# Patient Record
Sex: Male | Born: 1940 | Race: Black or African American | Hispanic: No | Marital: Married | Smoking: Current every day smoker
Health system: Southern US, Community
[De-identification: ages and names within clinical notes are randomized; demographics above are authoritative.]

## PROBLEM LIST (undated history)

## (undated) DIAGNOSIS — J449 Chronic obstructive pulmonary disease, unspecified: Secondary | ICD-10-CM

## (undated) DIAGNOSIS — K219 Gastro-esophageal reflux disease without esophagitis: Secondary | ICD-10-CM

## (undated) DIAGNOSIS — L97929 Non-pressure chronic ulcer of unspecified part of left lower leg with unspecified severity: Secondary | ICD-10-CM

## (undated) DIAGNOSIS — J45909 Unspecified asthma, uncomplicated: Secondary | ICD-10-CM

## (undated) DIAGNOSIS — Z9119 Patient's noncompliance with other medical treatment and regimen: Secondary | ICD-10-CM

## (undated) DIAGNOSIS — Z91199 Patient's noncompliance with other medical treatment and regimen due to unspecified reason: Secondary | ICD-10-CM

## (undated) DIAGNOSIS — G629 Polyneuropathy, unspecified: Secondary | ICD-10-CM

## (undated) DIAGNOSIS — N289 Disorder of kidney and ureter, unspecified: Secondary | ICD-10-CM

## (undated) DIAGNOSIS — I639 Cerebral infarction, unspecified: Secondary | ICD-10-CM

## (undated) DIAGNOSIS — I10 Essential (primary) hypertension: Secondary | ICD-10-CM

## (undated) DIAGNOSIS — I5022 Chronic systolic (congestive) heart failure: Secondary | ICD-10-CM

## (undated) DIAGNOSIS — L97919 Non-pressure chronic ulcer of unspecified part of right lower leg with unspecified severity: Secondary | ICD-10-CM

## (undated) DIAGNOSIS — I5021 Acute systolic (congestive) heart failure: Secondary | ICD-10-CM

## (undated) DIAGNOSIS — E119 Type 2 diabetes mellitus without complications: Secondary | ICD-10-CM

## (undated) HISTORY — PX: HAND LIGAMENT RECONSTRUCTION: SHX1726

## (undated) HISTORY — PX: FOOT SURGERY: SHX648

---

## 2008-11-05 DIAGNOSIS — I639 Cerebral infarction, unspecified: Secondary | ICD-10-CM

## 2008-11-05 HISTORY — PX: CAROTID ENDARTERECTOMY: SUR193

## 2008-11-05 HISTORY — DX: Cerebral infarction, unspecified: I63.9

## 2014-08-12 ENCOUNTER — Emergency Department (HOSPITAL_COMMUNITY): Payer: Medicare (Managed Care)

## 2014-08-12 ENCOUNTER — Inpatient Hospital Stay (HOSPITAL_COMMUNITY)
Admission: EM | Admit: 2014-08-12 | Discharge: 2014-08-14 | DRG: 291 | Disposition: A | Discharge status: Home / Self Care | Payer: Medicare (Managed Care) | Attending: Internal Medicine | Admitting: Internal Medicine

## 2014-08-12 ENCOUNTER — Encounter (HOSPITAL_COMMUNITY): Payer: Self-pay | Admitting: Emergency Medicine

## 2014-08-12 DIAGNOSIS — I129 Hypertensive chronic kidney disease with stage 1 through stage 4 chronic kidney disease, or unspecified chronic kidney disease: Secondary | ICD-10-CM | POA: Diagnosis present

## 2014-08-12 DIAGNOSIS — E1142 Type 2 diabetes mellitus with diabetic polyneuropathy: Secondary | ICD-10-CM | POA: Diagnosis present

## 2014-08-12 DIAGNOSIS — I255 Ischemic cardiomyopathy: Secondary | ICD-10-CM | POA: Diagnosis present

## 2014-08-12 DIAGNOSIS — E1129 Type 2 diabetes mellitus with other diabetic kidney complication: Secondary | ICD-10-CM

## 2014-08-12 DIAGNOSIS — Z8673 Personal history of transient ischemic attack (TIA), and cerebral infarction without residual deficits: Secondary | ICD-10-CM

## 2014-08-12 DIAGNOSIS — Z7982 Long term (current) use of aspirin: Secondary | ICD-10-CM | POA: Diagnosis not present

## 2014-08-12 DIAGNOSIS — E43 Unspecified severe protein-calorie malnutrition: Secondary | ICD-10-CM | POA: Diagnosis present

## 2014-08-12 DIAGNOSIS — R0902 Hypoxemia: Secondary | ICD-10-CM | POA: Diagnosis not present

## 2014-08-12 DIAGNOSIS — I272 Other secondary pulmonary hypertension: Secondary | ICD-10-CM | POA: Diagnosis present

## 2014-08-12 DIAGNOSIS — J9601 Acute respiratory failure with hypoxia: Secondary | ICD-10-CM | POA: Diagnosis present

## 2014-08-12 DIAGNOSIS — I509 Heart failure, unspecified: Secondary | ICD-10-CM

## 2014-08-12 DIAGNOSIS — R600 Localized edema: Secondary | ICD-10-CM | POA: Diagnosis not present

## 2014-08-12 DIAGNOSIS — F17210 Nicotine dependence, cigarettes, uncomplicated: Secondary | ICD-10-CM | POA: Diagnosis present

## 2014-08-12 DIAGNOSIS — Z6820 Body mass index (BMI) 20.0-20.9, adult: Secondary | ICD-10-CM | POA: Diagnosis not present

## 2014-08-12 DIAGNOSIS — Z8249 Family history of ischemic heart disease and other diseases of the circulatory system: Secondary | ICD-10-CM

## 2014-08-12 DIAGNOSIS — I5022 Chronic systolic (congestive) heart failure: Secondary | ICD-10-CM

## 2014-08-12 DIAGNOSIS — E1165 Type 2 diabetes mellitus with hyperglycemia: Secondary | ICD-10-CM

## 2014-08-12 DIAGNOSIS — Z79899 Other long term (current) drug therapy: Secondary | ICD-10-CM

## 2014-08-12 DIAGNOSIS — IMO0002 Reserved for concepts with insufficient information to code with codable children: Secondary | ICD-10-CM

## 2014-08-12 DIAGNOSIS — N183 Chronic kidney disease, stage 3 (moderate): Secondary | ICD-10-CM | POA: Diagnosis present

## 2014-08-12 DIAGNOSIS — M79609 Pain in unspecified limb: Secondary | ICD-10-CM | POA: Diagnosis not present

## 2014-08-12 DIAGNOSIS — I639 Cerebral infarction, unspecified: Secondary | ICD-10-CM

## 2014-08-12 DIAGNOSIS — G629 Polyneuropathy, unspecified: Secondary | ICD-10-CM

## 2014-08-12 DIAGNOSIS — I5031 Acute diastolic (congestive) heart failure: Secondary | ICD-10-CM

## 2014-08-12 DIAGNOSIS — I5021 Acute systolic (congestive) heart failure: Secondary | ICD-10-CM

## 2014-08-12 DIAGNOSIS — I5023 Acute on chronic systolic (congestive) heart failure: Principal | ICD-10-CM | POA: Diagnosis present

## 2014-08-12 DIAGNOSIS — R0602 Shortness of breath: Secondary | ICD-10-CM | POA: Diagnosis present

## 2014-08-12 DIAGNOSIS — I10 Essential (primary) hypertension: Secondary | ICD-10-CM

## 2014-08-12 HISTORY — DX: Chronic systolic (congestive) heart failure: I50.22

## 2014-08-12 HISTORY — DX: Acute systolic (congestive) heart failure: I50.21

## 2014-08-12 HISTORY — DX: Type 2 diabetes mellitus without complications: E11.9

## 2014-08-12 HISTORY — DX: Cerebral infarction, unspecified: I63.9

## 2014-08-12 HISTORY — DX: Essential (primary) hypertension: I10

## 2014-08-12 LAB — CBC WITH DIFFERENTIAL/PLATELET
BASOS ABS: 0 10*3/uL (ref 0.0–0.1)
BASOS PCT: 1 % (ref 0–1)
EOS ABS: 0 10*3/uL (ref 0.0–0.7)
EOS PCT: 1 % (ref 0–5)
HEMATOCRIT: 36.7 % — AB (ref 39.0–52.0)
HEMOGLOBIN: 12.3 g/dL — AB (ref 13.0–17.0)
Lymphocytes Relative: 29 % (ref 12–46)
Lymphs Abs: 1.4 10*3/uL (ref 0.7–4.0)
MCH: 30 pg (ref 26.0–34.0)
MCHC: 33.5 g/dL (ref 30.0–36.0)
MCV: 89.5 fL (ref 78.0–100.0)
MONO ABS: 0.5 10*3/uL (ref 0.1–1.0)
MONOS PCT: 11 % (ref 3–12)
NEUTROS ABS: 2.9 10*3/uL (ref 1.7–7.7)
Neutrophils Relative %: 58 % (ref 43–77)
Platelets: 201 10*3/uL (ref 150–400)
RBC: 4.1 MIL/uL — ABNORMAL LOW (ref 4.22–5.81)
RDW: 14.9 % (ref 11.5–15.5)
WBC: 5 10*3/uL (ref 4.0–10.5)

## 2014-08-12 LAB — BASIC METABOLIC PANEL
ANION GAP: 10 (ref 5–15)
BUN: 9 mg/dL (ref 6–23)
CALCIUM: 9.3 mg/dL (ref 8.4–10.5)
CO2: 26 mEq/L (ref 19–32)
CREATININE: 0.93 mg/dL (ref 0.50–1.35)
Chloride: 99 mEq/L (ref 96–112)
GFR, EST NON AFRICAN AMERICAN: 83 mL/min — AB (ref >90)
Glucose, Bld: 126 mg/dL — ABNORMAL HIGH (ref 70–99)
Potassium: 4.1 mEq/L (ref 3.7–5.3)
Sodium: 135 mEq/L — ABNORMAL LOW (ref 137–147)

## 2014-08-12 LAB — RAPID URINE DRUG SCREEN, HOSP PERFORMED
AMPHETAMINES: NOT DETECTED
BARBITURATES: NOT DETECTED
Benzodiazepines: NOT DETECTED
Cocaine: NOT DETECTED
Opiates: NOT DETECTED
TETRAHYDROCANNABINOL: NOT DETECTED

## 2014-08-12 LAB — PRO B NATRIURETIC PEPTIDE: Pro B Natriuretic peptide (BNP): 7427 pg/mL — ABNORMAL HIGH (ref 0–125)

## 2014-08-12 LAB — TROPONIN I: Troponin I: <0.3 ng/mL (ref <0.30)

## 2014-08-12 LAB — D-DIMER, QUANTITATIVE (NOT AT ARMC): D-Dimer, Quant: 1.03 ug/mL-FEU — ABNORMAL HIGH (ref 0.00–0.48)

## 2014-08-12 MED ORDER — FUROSEMIDE 10 MG/ML IJ SOLN
20.0000 mg | Freq: Once | INTRAMUSCULAR | Status: AC
  Administered 2014-08-12: 20 mg via INTRAVENOUS
  Filled 2014-08-12: qty 2

## 2014-08-12 MED ORDER — HEPARIN SODIUM (PORCINE) 5000 UNIT/ML IJ SOLN
5000.0000 [IU] | Freq: Three times a day (TID) | INTRAMUSCULAR | Status: DC
  Administered 2014-08-13 – 2014-08-14 (×3): 5000 [IU] via SUBCUTANEOUS
  Filled 2014-08-12 (×7): qty 1

## 2014-08-12 MED ORDER — GUAIFENESIN ER 600 MG PO TB12
600.0000 mg | ORAL_TABLET | Freq: Two times a day (BID) | ORAL | Status: DC
  Administered 2014-08-12 – 2014-08-14 (×4): 600 mg via ORAL
  Filled 2014-08-12 (×5): qty 1

## 2014-08-12 MED ORDER — ACETAMINOPHEN 650 MG RE SUPP: 650.0000 mg | Freq: Four times a day (QID) | RECTAL | Status: DC | PRN

## 2014-08-12 MED ORDER — POTASSIUM CHLORIDE CRYS ER 20 MEQ PO TBCR
40.0000 meq | EXTENDED_RELEASE_TABLET | Freq: Once | ORAL | Status: AC
  Administered 2014-08-12: 40 meq via ORAL
  Filled 2014-08-12: qty 2

## 2014-08-12 MED ORDER — ASPIRIN EC 81 MG PO TBEC
81.0000 mg | DELAYED_RELEASE_TABLET | Freq: Every day | ORAL | Status: DC
  Administered 2014-08-12 – 2014-08-13 (×2): 81 mg via ORAL
  Filled 2014-08-12 (×2): qty 1

## 2014-08-12 MED ORDER — SODIUM CHLORIDE 0.9 % IJ SOLN
3.0000 mL | Freq: Two times a day (BID) | INTRAMUSCULAR | Status: DC
  Administered 2014-08-12 – 2014-08-14 (×5): 3 mL via INTRAVENOUS

## 2014-08-12 MED ORDER — SODIUM CHLORIDE 0.9 % IV SOLN: 250.0000 mL | INTRAVENOUS | Status: DC | PRN

## 2014-08-12 MED ORDER — IOHEXOL 350 MG/ML SOLN
100.0000 mL | Freq: Once | INTRAVENOUS | Status: AC | PRN
  Administered 2014-08-12: 100 mL via INTRAVENOUS

## 2014-08-12 MED ORDER — ACETAMINOPHEN 325 MG PO TABS: 650.0000 mg | ORAL_TABLET | Freq: Four times a day (QID) | ORAL | Status: DC | PRN

## 2014-08-12 MED ORDER — GABAPENTIN 300 MG PO CAPS
300.0000 mg | ORAL_CAPSULE | Freq: Three times a day (TID) | ORAL | Status: DC
  Administered 2014-08-12 – 2014-08-14 (×6): 300 mg via ORAL
  Filled 2014-08-12 (×7): qty 1

## 2014-08-12 MED ORDER — LISINOPRIL 10 MG PO TABS
10.0000 mg | ORAL_TABLET | Freq: Every day | ORAL | Status: DC
Start: 2014-08-12 — End: 2014-08-14
  Administered 2014-08-12 – 2014-08-14 (×3): 10 mg via ORAL
  Filled 2014-08-12 (×3): qty 1

## 2014-08-12 MED ORDER — SODIUM CHLORIDE 0.9 % IJ SOLN: 3.0000 mL | Freq: Two times a day (BID) | INTRAMUSCULAR | Status: DC

## 2014-08-12 MED ORDER — SODIUM CHLORIDE 0.9 % IJ SOLN: 3.0000 mL | INTRAMUSCULAR | Status: DC | PRN

## 2014-08-12 MED ORDER — ALBUTEROL SULFATE (2.5 MG/3ML) 0.083% IN NEBU: 2.5000 mg | INHALATION_SOLUTION | RESPIRATORY_TRACT | Status: DC | PRN

## 2014-08-12 MED ORDER — FUROSEMIDE 10 MG/ML IJ SOLN
20.0000 mg | Freq: Two times a day (BID) | INTRAMUSCULAR | Status: DC
  Administered 2014-08-13 (×2): 20 mg via INTRAVENOUS
  Filled 2014-08-12 (×4): qty 2

## 2014-08-12 MED ORDER — PREDNISONE 20 MG PO TABS
60.0000 mg | ORAL_TABLET | Freq: Once | ORAL | Status: AC
  Administered 2014-08-12: 60 mg via ORAL
  Filled 2014-08-12: qty 3

## 2014-08-12 NOTE — ED Notes (Signed)
Pt returned from CT °

## 2014-08-12 NOTE — Progress Notes (Signed)
Pt arrived to floor. A/Ox4, oriented to room and floor. Pt high fall risks, told RN "i can get around by myself". Pt educated on fall risk protocol. Pt refused to take shoes and pants off for admission weight. RN unable to assess pt legs/buttox due to pt refusing to take pants off.

## 2014-08-12 NOTE — ED Provider Notes (Signed)
CSN: [id number]     Arrival date & time 08/12/14  1731 History   First MD Initiated Contact with Patient 08/12/14 1739     Chief Complaint  Patient presents with  . Shortness of Breath     (Consider location/radiation/quality/duration/timing/severity/associated sxs/prior Treatment) HPI Comments: Patient from home with 2 day history of shortness of breath he states he has been straining for roaches at his apartment over the past week and thinks he sprayed too much. Denies any history of lung problems. He does smoke half a pack of cigarettes a day. Does not use any nebulizers or inhalers at home. Denies any chest pain or fever. Cough is nonproductive. Denies any abdominal pain, nausea or vomiting. Breathing is worse when he lays flat and worse with speaking. Denies any sick contacts. Denies any previous history of lung problems or heart problems. He was given a nebulizer by EMS and feels much better. Initial oxygenation is 88% on room air.  The history is provided by the patient and the EMS personnel.    Past Medical History  Diagnosis Date  . Stroke 2010    "memory problems since"  . Hypertension   . Type II diabetes mellitus    Past Surgical History  Procedure Laterality Date  . Carotid endarterectomy Right 2010   Family History  Problem Relation Age of Onset  . Kidney disease Mother    History  Substance Use Topics  . Smoking status: Current Every Day Smoker -- 0.50 packs/day for 55 years    Types: Cigarettes  . Smokeless tobacco: Never Used  . Alcohol Use: Yes     Comment: "quit drinking in 1976    Review of Systems  Constitutional: Negative for fever, activity change and appetite change.  HENT: Negative for voice change.   Respiratory: Positive for cough and shortness of breath. Negative for chest tightness.   Cardiovascular: Negative for chest pain.  Gastrointestinal: Negative for nausea, vomiting and abdominal pain.  Genitourinary: Negative for dysuria, urgency and  hematuria.  Musculoskeletal: Negative for arthralgias and myalgias.  Skin: Negative for rash.  Neurological: Negative for dizziness, weakness and headaches.  A complete 10 system review of systems was obtained and all systems are negative except as noted in the HPI and PMH.      Allergies  Review of patient's allergies indicates no known allergies.  Home Medications   Prior to Admission medications   Not on File   BP 150/92  Pulse 114  Temp(Src) 97.1 F (36.2 C) (Oral)  Resp 18  Ht 5\' 10"  (1.778 m)  Wt 144 lb 3.2 oz (65.409 kg)  BMI 20.69 kg/m2  SpO2 93% Physical Exam  Nursing note and vitals reviewed. Constitutional: He is oriented to person, place, and time. He appears well-developed and well-nourished. No distress.  HENT:  Head: Normocephalic and atraumatic.  Mouth/Throat: Oropharynx is clear and moist. No oropharyngeal exudate.  Eyes: Conjunctivae and EOM are normal. Pupils are equal, round, and reactive to light.  Neck: Normal range of motion. Neck supple.  No meningismus.  Cardiovascular: Normal rate, regular rhythm, normal heart sounds and intact distal pulses.   No murmur heard. Pulmonary/Chest: Breath sounds normal. He is in respiratory distress.  Tachypnea to 30s. Speaking in short sentences. Lungs clear without wheezing  Abdominal: Soft. There is no tenderness. There is no rebound and no guarding.  Musculoskeletal: Normal range of motion. He exhibits no edema and no tenderness.  No peripheral edema  Neurological: He is alert and oriented to  person, place, and time. No cranial nerve deficit. He exhibits normal muscle tone. Coordination normal.  No ataxia on finger to nose bilaterally. No pronator drift. 5/5 strength throughout. CN 2-12 intact. Negative Romberg. Equal grip strength. Sensation intact. Gait is normal.   Skin: Skin is warm.  Psychiatric: He has a normal mood and affect. His behavior is normal.    ED Course  Procedures (including critical care  time) Labs Review Labs Reviewed  CBC WITH DIFFERENTIAL - Abnormal; Notable for the following:    RBC 4.10 (*)    Hemoglobin 12.3 (*)    HCT 36.7 (*)    All other components within normal limits  BASIC METABOLIC PANEL - Abnormal; Notable for the following:    Sodium 135 (*)    Glucose, Bld 126 (*)    GFR calc non Af Amer 83 (*)    All other components within normal limits  PRO B NATRIURETIC PEPTIDE - Abnormal; Notable for the following:    Pro B Natriuretic peptide (BNP) 7427.0 (*)    All other components within normal limits  D-DIMER, QUANTITATIVE - Abnormal; Notable for the following:    D-Dimer, Quant 1.03 (*)    All other components within normal limits  TROPONIN I  URINE RAPID DRUG SCREEN (HOSP PERFORMED)  BASIC METABOLIC PANEL  TROPONIN I  TROPONIN I  TROPONIN I  MAGNESIUM  LDL CHOLESTEROL, DIRECT  HEMOGLOBIN A1C  TSH  PROTIME-INR    Imaging Review Dg Chest 2 View  08/12/2014   CLINICAL DATA:  Shortness of breath.  EXAM: CHEST  2 VIEW  COMPARISON:  No prior chest imaging.  FINDINGS: Heart size appears mildly prominent. Pulmonary vascularity appears within normal limits. There are small to moderate posteriorly layering bilateral pleural effusions. Bilateral lower lobe opacities could reflect compressive atelectasis and/or airspace disease. Negative for pneumothorax. Trachea midline. Degenerative changes of the shoulders.  IMPRESSION: Small to moderate bilateral pleural effusions with bilateral lower lobe atelectasis and/or airspace disease.   Electronically Signed   By: Britta Mccreedy M.D.   On: 08/12/2014 18:38   Ct Angio Chest Pe W/cm &/or Wo Cm  08/12/2014   CLINICAL DATA:  Hypoxia, shortness of breath  EXAM: CT ANGIOGRAPHY CHEST WITH CONTRAST  TECHNIQUE: Multidetector CT imaging of the chest was performed using the standard protocol during bolus administration of intravenous contrast. Multiplanar CT image reconstructions and MIPs were obtained to evaluate the vascular  anatomy.  CONTRAST:  OMNIPAQUE IOHEXOL 350 MG/ML SOLN  COMPARISON:  None.  FINDINGS: There is adequate opacification of the pulmonary arteries. There is no pulmonary embolus. The main pulmonary artery, right main pulmonary artery and left main pulmonary arteries are normal in size. The heart size is normal. There is no pericardial effusion. There is thoracic aortic atherosclerosis. There is coronary artery atherosclerosis in the left main, lad, circumflex, RCA and first diagonal branch.  There are moderate bilateral pleural effusions with associated compressive atelectasis. There is no pneumothorax. There is mild interstitial thickening and patchy ground-glass opacities in the upper lobes bilaterally.  There is no axillary, hilar, or mediastinal adenopathy.  There is no lytic or blastic osseous lesion. There are anterior flowing bridging osteophytes involving the thoracic spine as can be seen with diffuse idiopathic skeletal hyperostosis (DISH).  The visualized portions of the upper abdomen are unremarkable.  Review of the MIP images confirms the above findings.  IMPRESSION: 1. No evidence of pulmonary embolus. 2. Moderate bilateral pleural effusions. There is likely underlying pulmonary edema.  Electronically Signed   By: Elige Ko   On: 08/12/2014 20:19     EKG Interpretation None      MDM   Final diagnoses:  Hypoxia  Congestive heart failure, unspecified congestive heart failure chronicity, unspecified congestive heart failure type   shortness of breath with history of smoking and exposure to bug spray. No chest pain. Lungs are clear without wheezing.  CXR shows small effusions.  BNP 7000 D-dimer positive.  No PE.  Bilateral effusions with some edema.  IV lasix given.  Patient requires O2 to maintain saturations.  Tachycardia 110s.  RR 30s.  No distress, speaking in full sentences.   Admission d/w Dr. Clyde Lundborg.    Date: 08/12/2014  Rate: 112  Rhythm: sinus tachycardia  QRS Axis:  normal  Intervals: normal  ST/T Wave abnormalities: nonspecific ST/T changes  Conduction Disutrbances:none  Narrative Interpretation: LVH  Old EKG Reviewed: none available    Glynn Octave, MD 08/12/14 [phone]

## 2014-08-12 NOTE — ED Notes (Signed)
Pt O2-90-93% on 3L [place] at rest, Dr. Manus Gunningancour made aware, states do not mobilize the pt.

## 2014-08-12 NOTE — ED Notes (Signed)
Pt presents from home via [facility] with c/o SOB x2 days. Pt reports has sprayed copious amounts of "bug spray" in his apartment x7 days and believes this is causing his shortness of breath.  Pt becomes short of breath with movement and coughs intermittently while speaking. Pt denies pain.

## 2014-08-12 NOTE — ED Notes (Signed)
Attempted Report 

## 2014-08-12 NOTE — H&P (Addendum)
[facility] History and Physical  Dalon Reichart ZOX:[id number] DOB: 1941/10/15 DOA: 08/12/2014  Referring physician: ED physician PCP: No primary provider on file.  Specialists:   Chief Complaint: SOB   HPI: Russell Hamilton is a 73 y.o. male with a past medical history of stroke, hypertension, diabetes type 2, peripheral neuropathy, who presents with shortness of breath.  Patient reports that he has hx of hypertension and diabetes, which were well controlled in the past and therefore he stopped taking medications. In the past 2 or 3 days, he started having shortness of breath and dry cough. He does not have chest pain, fever or chills. He does not have pain over his calf areas. No recent long distance traveling history. He does not have runny nose, sore throat, nausea, vomiting, abdominal pain, diarrhea, dysuria or rashes. He does not have leg edema.   He was found to have elevated d-dimer. CTA is negative for PE. Chest x-ray showed bilateral pleural effusion and pulmonary edema. ProBNP 7427. He is admitted to inpatient for further evaluation and treatment  Review of Systems: As presented in the history of presenting illness, rest negative.  Where does patient live?  Recently moved to [place] from [place]. Currently lives alone at apartment Can patient participate in ADLs? Yes  Allergy: No Known Allergies  Past Medical History  Diagnosis Date  . Stroke 2010    History reviewed. No pertinent past surgical history.  Social History:  reports that he has been smoking Cigarettes.  He has been smoking about 0.50 packs per day. He does not have any smokeless tobacco history on file. He reports that he does not use illicit drugs. His alcohol history is not on file.  Family History:  Family History  Problem Relation Age of Onset  . Kidney disease Mother      Prior to Admission medications   Not on File    Physical Exam: Filed Vitals:   08/12/14 1945 08/12/14 2030 08/12/14 2039  08/12/14 2100  BP: 159/96 157/94  155/100  Pulse:      Temp:      TempSrc:      Resp: 16 21  19   SpO2: 90%  92% 91%   General: Not in acute distress HEENT:       Eyes: PERRL, EOMI, no scleral icterus       ENT: No discharge from the ears and nose, no pharynx injection, no tonsillar enlargement.        Neck: positive JVD, no bruit, no mass felt. Cardiac: S1/S2, RRR, No murmurs, gallops or rubs Pulm: decreased air movement bilaterally. Fine crackles over bases posteriorly. No wheezing or rubs. Abd: Soft, nondistended, nontender, no rebound pain, no organomegaly, BS present Ext: No edema. 2+DP/PT pulse bilaterally Musculoskeletal: No joint deformities, erythema, or stiffness, ROM full Skin: No rashes.  Neuro: Alert and oriented X3, cranial nerves II-XII grossly intact, muscle strength 5/5 in all extremeties, sensation to light touch intact. Brachial reflex 2+ bilaterally. Knee reflex 1+ bilaterally.  Psych: Patient is not psychotic, no suicidal or hemocidal ideation.  Labs on Admission:  Basic Metabolic Panel:  Recent Labs Lab 08/12/14 1756  NA 135*  K 4.1  CL 99  CO2 26  GLUCOSE 126*  BUN 9  CREATININE 0.93  CALCIUM 9.3   Liver Function Tests: No results found for this basename: AST, ALT, ALKPHOS, BILITOT, PROT, ALBUMIN,  in the last 168 hours No results found for this basename: LIPASE, AMYLASE,  in the last 168 hours No results  found for this basename: AMMONIA,  in the last 168 hours CBC:  Recent Labs Lab 08/12/14 1756  WBC 5.0  NEUTROABS 2.9  HGB 12.3*  HCT 36.7*  MCV 89.5  PLT 201   Cardiac Enzymes:  Recent Labs Lab 08/12/14 1756  TROPONINI <0.30    BNP (last 3 results)  Recent Labs  08/12/14 1756  PROBNP 7427.0*   CBG: No results found for this basename: GLUCAP,  in the last 168 hours  Radiological Exams on Admission: Dg Chest 2 View  08/12/2014   CLINICAL DATA:  Shortness of breath.  EXAM: CHEST  2 VIEW  COMPARISON:  No prior chest imaging.   FINDINGS: Heart size appears mildly prominent. Pulmonary vascularity appears within normal limits. There are small to moderate posteriorly layering bilateral pleural effusions. Bilateral lower lobe opacities could reflect compressive atelectasis and/or airspace disease. Negative for pneumothorax. Trachea midline. Degenerative changes of the shoulders.  IMPRESSION: Small to moderate bilateral pleural effusions with bilateral lower lobe atelectasis and/or airspace disease.   Electronically Signed   By: Britta MccreedySusan  Turner M.D.   On: 08/12/2014 18:38   Ct Angio Chest Pe W/cm &/or Wo Cm  08/12/2014   CLINICAL DATA:  Hypoxia, shortness of breath  EXAM: CT ANGIOGRAPHY CHEST WITH CONTRAST  TECHNIQUE: Multidetector CT imaging of the chest was performed using the standard protocol during bolus administration of intravenous contrast. Multiplanar CT image reconstructions and MIPs were obtained to evaluate the vascular anatomy.  CONTRAST:  100mL OMNIPAQUE IOHEXOL 350 MG/ML SOLN  COMPARISON:  None.  FINDINGS: There is adequate opacification of the pulmonary arteries. There is no pulmonary embolus. The main pulmonary artery, right main pulmonary artery and left main pulmonary arteries are normal in size. The heart size is normal. There is no pericardial effusion. There is thoracic aortic atherosclerosis. There is coronary artery atherosclerosis in the left main, lad, circumflex, RCA and first diagonal branch.  There are moderate bilateral pleural effusions with associated compressive atelectasis. There is no pneumothorax. There is mild interstitial thickening and patchy ground-glass opacities in the upper lobes bilaterally.  There is no axillary, hilar, or mediastinal adenopathy.  There is no lytic or blastic osseous lesion. There are anterior flowing bridging osteophytes involving the thoracic spine as can be seen with diffuse idiopathic skeletal hyperostosis (DISH).  The visualized portions of the upper abdomen are unremarkable.   Review of the MIP images confirms the above findings.  IMPRESSION: 1. No evidence of pulmonary embolus. 2. Moderate bilateral pleural effusions. There is likely underlying pulmonary edema.   Electronically Signed   By: Elige KoHetal  Patel   On: 08/12/2014 20:19    EKG: Independently reviewed.   Assessment/Plan Principal Problem:   Acute CHF (congestive heart failure) Active Problems:   History of stroke   HTN (hypertension)   Peripheral neuropathy   CHF (congestive heart failure)  1. Acute congestive heart failure: Patient's presentations are most likely caused by acute congestive heart failure, possibly diastolic congestive heart failure. Patient has history of hypertension, but is not taking any medications. His blood pressure is elevated on admission. ProBNP 7427 and CXR showed pulmonary edema. Positive JVD. All are consistent with CHF.  Pulmonary embolism was ruled out by negative CTA. Pneumonia is unlikely given no fever, chills, chest pain, and lack of infiltration on chest x-ray.  -will admit to tele bed -will treat with IV low dose of lasix, 20 mg bid given he is lasix nave -start ASA,  lisinopril. -will cycle CE X3 -will get 2-D  echo to evaluate EF -will get EKG in AM -strict In/Out -Daily body weight. -BMP and Mg in am  2. HTN: bp 159/96 on admission. -will start Lisinopril  3. peripheral neuropathy: Use to take gabapentin which was effective per patient, but has not been taking it recently. Still has bilateral lower leg pain.  -will start gabapentin 300 mg 3 times a day .  4. DM-II: Not on medications at home. His CBG was 126 of admission  - will check A1c and monitor CBG  DVT ppx: SQ Heparin Code Status: partial code (CPR and shock OK; but no intubation) Family Communication: Yes, patient's  nephew at bed side Disposition Plan: Admit to inpatient  Lorretta Harp [facility] Pager [phone]  If 7PM-7AM, please contact night-coverage [email or web address] Password  [email or web address] 08/12/2014, 9:41 PM

## 2014-08-13 ENCOUNTER — Encounter (HOSPITAL_COMMUNITY): Payer: Self-pay | Admitting: Physician Assistant

## 2014-08-13 DIAGNOSIS — E43 Unspecified severe protein-calorie malnutrition: Secondary | ICD-10-CM | POA: Insufficient documentation

## 2014-08-13 DIAGNOSIS — I5021 Acute systolic (congestive) heart failure: Secondary | ICD-10-CM

## 2014-08-13 DIAGNOSIS — IMO0002 Reserved for concepts with insufficient information to code with codable children: Secondary | ICD-10-CM

## 2014-08-13 DIAGNOSIS — I10 Essential (primary) hypertension: Secondary | ICD-10-CM | POA: Diagnosis present

## 2014-08-13 DIAGNOSIS — E1129 Type 2 diabetes mellitus with other diabetic kidney complication: Secondary | ICD-10-CM

## 2014-08-13 DIAGNOSIS — E1165 Type 2 diabetes mellitus with hyperglycemia: Secondary | ICD-10-CM

## 2014-08-13 DIAGNOSIS — I5031 Acute diastolic (congestive) heart failure: Secondary | ICD-10-CM

## 2014-08-13 DIAGNOSIS — G629 Polyneuropathy, unspecified: Secondary | ICD-10-CM

## 2014-08-13 DIAGNOSIS — I059 Rheumatic mitral valve disease, unspecified: Secondary | ICD-10-CM

## 2014-08-13 DIAGNOSIS — I5022 Chronic systolic (congestive) heart failure: Secondary | ICD-10-CM | POA: Diagnosis present

## 2014-08-13 DIAGNOSIS — I639 Cerebral infarction, unspecified: Secondary | ICD-10-CM | POA: Diagnosis present

## 2014-08-13 LAB — TSH: TSH: 1.19 u[IU]/mL (ref 0.350–4.500)

## 2014-08-13 LAB — BASIC METABOLIC PANEL
ANION GAP: 14 (ref 5–15)
BUN: 13 mg/dL (ref 6–23)
CO2: 26 meq/L (ref 19–32)
CREATININE: 1.06 mg/dL (ref 0.50–1.35)
Calcium: 9 mg/dL (ref 8.4–10.5)
Chloride: 96 mEq/L (ref 96–112)
GFR calc Af Amer: 80 mL/min — ABNORMAL LOW (ref >90)
GFR calc non Af Amer: 69 mL/min — ABNORMAL LOW (ref >90)
Glucose, Bld: 231 mg/dL — ABNORMAL HIGH (ref 70–99)
POTASSIUM: 4.5 meq/L (ref 3.7–5.3)
Sodium: 136 mEq/L — ABNORMAL LOW (ref 137–147)

## 2014-08-13 LAB — TROPONIN I: Troponin I: <0.3 ng/mL (ref <0.30)

## 2014-08-13 LAB — GLUCOSE, CAPILLARY
GLUCOSE-CAPILLARY: 213 mg/dL — AB (ref 70–99)
GLUCOSE-CAPILLARY: 234 mg/dL — AB (ref 70–99)
Glucose-Capillary: 179 mg/dL — ABNORMAL HIGH (ref 70–99)
Glucose-Capillary: 194 mg/dL — ABNORMAL HIGH (ref 70–99)

## 2014-08-13 LAB — HEMOGLOBIN A1C
Hgb A1c MFr Bld: 6.9 % — ABNORMAL HIGH (ref <5.7)
Mean Plasma Glucose: 151 mg/dL — ABNORMAL HIGH (ref <117)

## 2014-08-13 LAB — PROTIME-INR
INR: 1.17 (ref 0.00–1.49)
PROTHROMBIN TIME: 14.9 s (ref 11.6–15.2)

## 2014-08-13 LAB — MAGNESIUM: Magnesium: 1.8 mg/dL (ref 1.5–2.5)

## 2014-08-13 LAB — LDL CHOLESTEROL, DIRECT: LDL DIRECT: 93 mg/dL

## 2014-08-13 MED ORDER — CARVEDILOL 3.125 MG PO TABS
3.1250 mg | ORAL_TABLET | Freq: Two times a day (BID) | ORAL | Status: DC
  Filled 2014-08-13 (×2): qty 1

## 2014-08-13 MED ORDER — GABAPENTIN 600 MG PO TABS: 300.0000 mg | ORAL_TABLET | Freq: Two times a day (BID) | ORAL | Status: DC

## 2014-08-13 MED ORDER — ASPIRIN 81 MG PO CHEW
81.0000 mg | CHEWABLE_TABLET | Freq: Every day | ORAL | Status: DC
  Administered 2014-08-13 – 2014-08-14 (×2): 81 mg via ORAL
  Filled 2014-08-13 (×2): qty 1

## 2014-08-13 MED ORDER — GLUCERNA SHAKE PO LIQD
237.0000 mL | Freq: Three times a day (TID) | ORAL | Status: DC
  Administered 2014-08-13 – 2014-08-14 (×3): 237 mL via ORAL

## 2014-08-13 MED ORDER — SIMETHICONE 80 MG PO CHEW
80.0000 mg | CHEWABLE_TABLET | Freq: Four times a day (QID) | ORAL | Status: DC
  Administered 2014-08-13 – 2014-08-14 (×6): 80 mg via ORAL
  Filled 2014-08-13 (×8): qty 1

## 2014-08-13 MED ORDER — CARVEDILOL 6.25 MG PO TABS
6.2500 mg | ORAL_TABLET | Freq: Two times a day (BID) | ORAL | Status: DC
  Administered 2014-08-13 – 2014-08-14 (×3): 6.25 mg via ORAL
  Filled 2014-08-13 (×4): qty 1

## 2014-08-13 MED ORDER — ENSURE COMPLETE PO LIQD
237.0000 mL | Freq: Three times a day (TID) | ORAL | Status: DC
  Administered 2014-08-13 – 2014-08-14 (×4): 237 mL via ORAL

## 2014-08-13 MED ORDER — INSULIN ASPART 100 UNIT/ML ~~LOC~~ SOLN: 0.0000 [IU] | Freq: Every day | SUBCUTANEOUS | Status: DC

## 2014-08-13 MED ORDER — FAMOTIDINE 20 MG PO TABS
20.0000 mg | ORAL_TABLET | Freq: Two times a day (BID) | ORAL | Status: DC
  Administered 2014-08-13 – 2014-08-14 (×2): 20 mg via ORAL
  Filled 2014-08-13 (×4): qty 1

## 2014-08-13 MED ORDER — INSULIN ASPART 100 UNIT/ML ~~LOC~~ SOLN
0.0000 [IU] | Freq: Three times a day (TID) | SUBCUTANEOUS | Status: DC
  Administered 2014-08-13: 3 [IU] via SUBCUTANEOUS
  Administered 2014-08-13: 5 [IU] via SUBCUTANEOUS
  Administered 2014-08-14 (×2): 2 [IU] via SUBCUTANEOUS
  Administered 2014-08-14: 3 [IU] via SUBCUTANEOUS

## 2014-08-13 NOTE — Progress Notes (Signed)
PT Cancellation Note  Patient Details Name: Nat MathJames Paske MRN: [id number] DOB: 05/21/1941   Cancelled Treatment:    Reason Eval/Treat Not Completed:  (Pt stated he has gotten no sleep.  Refused.)   Tawni MillersWhite, Safaa Stingley F 08/13/2014, 12:36 PM  Aspin Palomarez,PT Acute Rehabilitation [phone] (pager)

## 2014-08-13 NOTE — Progress Notes (Addendum)
[facility] Progress Note   Nat MathJames Hugill ZOX:[id number] DOB: 05/01/1941 DOA: 08/12/2014 PCP: No PCP Per Patient  Brief narrative: Nat MathJames Goga is a 73 y.o. male Nat MathJames Pell is a 73 y.o. male with a past medical history of stroke, hypertension, diabetes type 2, peripheral neuropathy, who presents with shortness of breath. He recently moved to the area, does not have a local PCP and has run out of his medications. He last saw his previous PCP in January.     Subjective: Has a dry cough. No dyspnea at rest.   Assessment/Plan: Principal Problem: Acute resp failure with dyspnea on exertion (a) acute systolic CHF - CXR reveals pl effusions and edema - ECHO reveals EF of 40%, pulm HTN with peak pressure of 50. No mentions of diastolic dysfunction - was diffuse hypokinesis worse in the inf wall. Will ask for cards eval.  - cont LAsix, Lisinopril- follow I and O- pt currently on room air.   (b) may also have COPD as he is a smoker - need outpt PFTS or PFTS as inpatient once fully diuresed   Active Problems: Smoker - states he does not need a nicotine patch- has been trying to quit. Down to 7 cig per day    History of stroke - start ASA- obtain lipid profile    Peripheral neuropathy - resume Neurontin- pt cant remember dose  Pain and redness in b/l toes - check ABI  DM - was on Metformin -A1c 6.9 - sliding scale insulin  CKD 3 - follow     Protein-calorie malnutrition, severe - add Glucerna   Code Status: full code Family Communication: none Disposition Plan: home- note- patient has no insurance- will need to prescribe meds from [facility] drug list- will ask case management to give him contact for [place] [facility] DVT prophylaxis: Heparin  Consultants: Will consult cardiology  Procedures: ECHO  Antibiotics: Anti-infectives   None     Objective: [facility]   08/12/14 2214 08/13/14 0413  Weight: 65.409 kg (144 lb 3.2 oz) 65.4 kg (144 lb 2.9  oz)    Intake/Output Summary (Last 24 hours) at 08/13/14 1436 Last data filed at 08/13/14 0939  Gross per 24 hour  Intake    480 ml  Output    300 ml  Net    180 ml     Vitals Filed Vitals:   08/13/14 0142 08/13/14 0413 08/13/14 0449 08/13/14 1423  BP: 150/90 142/94  120/63  Pulse: 113 107 98 108  Temp:  97.7 F (36.5 C)  98 F (36.7 C)  TempSrc:  Oral  Oral  Resp:  18  20  Height:      Weight:  65.4 kg (144 lb 2.9 oz)    SpO2:  100%  92%    Exam: General: No acute respiratory distress Lungs: Clear to auscultation bilaterally without wheezes or crackles Cardiovascular: Regular rate and rhythm without murmur gallop or rub normal S1 and S2 Abdomen: Nontender, nondistended, soft, bowel sounds positive, no rebound, no ascites, no appreciable mass Extremities: No significant cyanosis, clubbing, or edema bilateral lower extremities  Data Reviewed: Basic Metabolic Panel:  Recent Labs Lab 08/12/14 1756 08/13/14 0428  NA 135* 136*  K 4.1 4.5  CL 99 96  CO2 26 26  GLUCOSE 126* 231*  BUN 9 13  CREATININE 0.93 1.06  CALCIUM 9.3 9.0  MG  --  1.8   Liver Function Tests: No results found for this basename: AST, ALT, ALKPHOS, BILITOT, PROT,  ALBUMIN,  in the last 168 hours No results found for this basename: LIPASE, AMYLASE,  in the last 168 hours No results found for this basename: AMMONIA,  in the last 168 hours CBC:  Recent Labs Lab 08/12/14 1756  WBC 5.0  NEUTROABS 2.9  HGB 12.3*  HCT 36.7*  MCV 89.5  PLT 201   Cardiac Enzymes:  Recent Labs Lab 08/12/14 1756 08/12/14 2335 08/13/14 0428  TROPONINI <0.30 <0.30 <0.30   BNP (last 3 results)  Recent Labs  08/12/14 1756  PROBNP 7427.0*   CBG:  Recent Labs Lab 08/13/14 0629 08/13/14 1156  GLUCAP 213* 234*    No results found for this or any previous visit (from the past 240 hour(s)).   Studies:  Recent x-ray studies have been reviewed in detail by the Attending Physician  Scheduled  Meds:  Scheduled Meds: . aspirin EC  81 mg Oral Daily  . famotidine  20 mg Oral BID  . feeding supplement (ENSURE COMPLETE)  237 mL Oral TID BM  . furosemide  20 mg Intravenous BID  . gabapentin  300 mg Oral TID  . guaiFENesin  600 mg Oral BID  . heparin  5,000 Units Subcutaneous 3 times per day  . insulin aspart  0-15 Units Subcutaneous TID WC  . insulin aspart  0-5 Units Subcutaneous QHS  . lisinopril  10 mg Oral Daily  . simethicone  80 mg Oral QID  . sodium chloride  3 mL Intravenous Q12H  . sodium chloride  3 mL Intravenous Q12H   Continuous Infusions:   Time spent on care of this patient: 35 min   Breona Cherubin, MD 08/13/2014, 2:36 PM  LOS: 1 day   [facility] Office  [phone] Pager - Text Page per [email or web address]  If 7PM-7AM, please contact night-coverage [email or web address]

## 2014-08-13 NOTE — Evaluation (Signed)
Occupational Therapy Evaluation and Discharge Patient Details Name: Russell Hamilton MRN: [id number] DOB: 03/22/1941 Today's Date: 08/13/2014    History of Present Illness Russell Hamilton is a 73 y.o. male with a past medical history of stroke, hypertension, diabetes type 2, peripheral neuropathy, who presents with shortness of breath. Patient reports that he has hx of hypertension and diabetes, which were well controlled in the past and therefore he stopped taking medications   Clinical Impression   This 73 yo male admitted with above presents to acute OT at a Mod I/Independent level. His sats did drop on room air to 88% and I educated pt on purse lipped breathing. Re-applied O2 at 1 liter with sats back up to 94%. Acute OT will sign off.    Follow Up Recommendations  No OT follow up    Equipment Recommendations  None recommended by OT       Precautions / Restrictions Precautions Precautions: None      Mobility Bed Mobility Overal bed mobility: Independent                Transfers Overall transfer level: Independent                         ADL Overall ADL's : Modified independent                                                       Pertinent Vitals/Pain Pain Assessment: No/denies pain     Hand Dominance Right   Extremity/Trunk Assessment Upper Extremity Assessment Upper Extremity Assessment: Overall WFL for tasks assessed           Communication Communication Communication: No difficulties   Cognition Arousal/Alertness: Awake/alert Behavior During Therapy: WFL for tasks assessed/performed Overall Cognitive Status: Within Functional Limits for tasks assessed                                Home Living Family/patient expects to be discharged to:: Private residence Living Arrangements: Alone Available Help at Discharge: Family;Available PRN/intermittently Type of Home: House Home Access: Stairs to enter [employer]ance  Stairs-Number of Steps: 12   Home Layout: One level     Bathroom Shower/Tub: Tub/shower unit;Curtain (takes tub baths) Shower/tub characteristics: Curtain       Home Equipment: Cane - single point          Prior Functioning/Environment Level of Independence: Independent with assistive device(s)        Comments: Uses SPC "all the time", but when I offerred it to him to go to the bathroom he said, "I don't need it"    OT Diagnosis: Generalized weakness         OT Goals(Current goals can be found in the care plan section) Acute Rehab OT Goals Patient Stated Goal: to get some rest  OT Frequency:                End of Session Equipment Utilized During Treatment:  (None)  Activity Tolerance: Patient tolerated treatment well Patient left: in bed;with call bell/phone within reach;with family/visitor present   Time: [id number] OT Time Calculation (min): 10 min Charges:  OT General Charges $OT Visit: 1 Procedure OT Evaluation $Initial OT Evaluation Tier I: 1 Procedure  Russell Hamilton, Russell Hamilton  Russell Hamilton 08/13/2014, 4:43 PM

## 2014-08-13 NOTE — Progress Notes (Signed)
   Echocardiogram 2D Echocardiogram has been performed.  Dorothey BasemanReel, Seanpaul M 08/13/2014, 10:52 AM

## 2014-08-13 NOTE — Progress Notes (Signed)
Pt cbg 213 this AM. No SSI orders in place. MD on call Donnamarie PoagK Kirby paged via amion textpage.

## 2014-08-13 NOTE — Progress Notes (Signed)
Patient refused lab draw this morning and stated "Please get out of here" requesting nursing staff to get out of his room. Dr. Butler Denmarkizwan paged to inform of patient inability to cooperate with treatment.

## 2014-08-13 NOTE — Progress Notes (Signed)
INITIAL NUTRITION ASSESSMENT  DOCUMENTATION CODES Per approved criteria  -severe malnutrition in the context of chronic illness  Pt meets criteria for severe MALNUTRITION in the context of chronic illness as evidenced by moderate fat and muscle wasting and reported 22% weight loss within the past year.  INTERVENTION: - Ensure Complete po TID, each supplement provides 350 kcal and 13 grams of protein. - RD will continue to follow for nutrition care plan.  NUTRITION DIAGNOSIS: Inadequate oral intake related to shortness of breath as evidenced by weight loss and reported weight loss.   Goal: Pt to meet >/= 90% of their estimated nutrition needs   Monitor:  Weight trend, po intake, acceptance of supplements  Reason for Assessment: MST  73 y.o. male  Admitting Dx: Acute CHF (congestive heart failure)  ASSESSMENT: 73 y.o. male with a past medical history of stroke, hypertension, diabetes type 2, peripheral neuropathy, who presents with shortness of breath.  - Pt reports that he does not eat as much as he should. He reports a 40 lb wt loss in the past year. He has never tried nutritional supplements and agreed that he may benefit from them.   Nutrition Focused Physical Exam:  Subcutaneous Fat:  Orbital Region: moderate wasting Upper Arm Region: moderate wasting Thoracic and Lumbar Region: moderate wasting  Muscle:  Temple Region: moderate to severe wasting Clavicle Bone Region: moderate to severe wasting Clavicle and Acromion Bone Region: moderate to severe wasting Scapular Bone Region: n/a Dorsal Hand: moderate to severe wasting Patellar Region: moderate to severe wasting Anterior Thigh Region: moderate wasting Posterior Calf Region: moderate wasting  Edema: none  Height: Ht Readings from Last 1 Encounters:  08/12/14 5\' 10"  (1.778 m)    Weight: Wt Readings from Last 1 Encounters:  08/13/14 144 lb 2.9 oz (65.4 kg)    Ideal Body Weight: 73 kg  % Ideal Body  Weight: 90%  Wt Readings from Last 10 Encounters:  08/13/14 144 lb 2.9 oz (65.4 kg)    Usual Body Weight: 185 lbs on year ago  % Usual Body Weight: 78%  BMI:  Body mass index is 20.69 kg/(m^2).  Estimated Nutritional Needs: Kcal: 1950-2150 Protein: 105-115 g Fluid: 2.0-2.2 L  Skin: intact  Diet Order: Cardiac  EDUCATION NEEDS: -Education needs addressed   Intake/Output Summary (Last 24 hours) at 08/13/14 1316 Last data filed at 08/13/14 0939  Gross per 24 hour  Intake    480 ml  Output    300 ml  Net    180 ml    Last BM: 10/9   Labs:   Recent Labs Lab 08/12/14 1756 08/13/14 0428  NA 135* 136*  K 4.1 4.5  CL 99 96  CO2 26 26  BUN 9 13  CREATININE 0.93 1.06  CALCIUM 9.3 9.0  MG  --  1.8  GLUCOSE 126* 231*    CBG (last 3)   Recent Labs  08/13/14 0629 08/13/14 1156  GLUCAP 213* 234*    Scheduled Meds: . aspirin EC  81 mg Oral Daily  . famotidine  20 mg Oral BID  . furosemide  20 mg Intravenous BID  . gabapentin  300 mg Oral TID  . guaiFENesin  600 mg Oral BID  . heparin  5,000 Units Subcutaneous 3 times per day  . insulin aspart  0-15 Units Subcutaneous TID WC  . insulin aspart  0-5 Units Subcutaneous QHS  . lisinopril  10 mg Oral Daily  . simethicone  80 mg Oral QID  .  sodium chloride  3 mL Intravenous Q12H  . sodium chloride  3 mL Intravenous Q12H    Continuous Infusions:   Past Medical History  Diagnosis Date  . Stroke 2010    "memory problems since"  . Hypertension   . Type II diabetes mellitus     Past Surgical History  Procedure Laterality Date  . Carotid endarterectomy Right 2010    Ebbie LatusHaley Hawkins RD, LDN

## 2014-08-13 NOTE — Care Management Note (Addendum)
  Page 1 of 1   08/13/2014     2:56:26 PM CARE MANAGEMENT NOTE 08/13/2014  Patient:  Russell Hamilton,Russell Hamilton   Account Number:  [id number]  Date Initiated:  08/13/2014  Documentation initiated by:  Donato SchultzHUTCHINSON,Davonne Baby  Subjective/Objective Assessment:   CHF     Action/Plan:   CM to follow for disposition needs   Anticipated DC Date:  08/16/2014   Anticipated DC Plan:  HOME/SELF CARE         Choice offered to / List presented to:             Status of service:  Completed, signed off Medicare Important Message given?  NA - LOS <3 / Initial given by admissions (If response is "NO", the following Medicare IM given date fields will be blank) Date Medicare IM given:   Medicare IM given by:   Date Additional Medicare IM given:   Additional Medicare IM given by:    Discharge Disposition:    Per UR Regulation:  Reviewed for med. necessity/level of care/duration of stay  If discussed at Long Length of Stay Meetings, dates discussed:    Comments:  Jodeci Roarty RN, BSN, MSHL, CCM  Nurse - Case Manager,  ([id number])  [phone]  08/13/2014 ADM:  CHF Social:  [place] Currently [place] Dispo Plan:  pending.

## 2014-08-13 NOTE — Consult Note (Signed)
CARDIOLOGY CONSULT NOTE   Patient ID: Russell Hamilton MRN: [id number] DOB/AGE: Dec 29, 1940 73 y.o.  Admit date: 08/12/2014  Primary Physician   No PCP Per Patient Primary Cardiologist   New Reason for Consultation   CHF  HPI: Russell Hamilton is a 73 y.o. male with a history of stroke, hypertension, diabetes type 2, peripheral neuropathy, who presented to [facility] yesterday with shortness of breath and found to be in an acute CHF exacerbation.  Patient recently moved from [place] to [place] to be closer to family. He reports that he has hx of hypertension and diabetes, which were well controlled in the past and therefore he stopped taking medications. "[facility] and [facility] him that was no longer hypertensive and did not need medications anymore." He has not been on any BP meds for over 2 years. He ran out of his other medications including metformin, gabapentin and ASA about a month ago. He has never been followed by a cardiologist.   Over the past 2 or 3 days, he started having shortness of breath, dry cough orthopnea and PND which prompted him to go to the ED. He has never has anything like this before. He does not have chest pain, fever or chills. He does not have pain over his calf areas. No recent long distance traveling history. He does not have runny nose, sore throat, nausea, vomiting, abdominal pain, diarrhea, dysuria or rashes. He does not have leg edema. He has a family history of CAD in sister and brother who both have had interventions in their 74's and 28s respectively. He also has a long history of heavy tobacco abuse.   In the ED he was found to have elevated d-dimer. CTA negative for PE. Chest x-ray showed bilateral pleural effusion and pulmonary edema. ProBNP 7427. 2D ECHO today with EF 40%, diffuse hypokinesis possibly worse in the inferior wall. Mild MR, mild LA dilation, PA pressure 50. Trivial pericardial effusion. When discussed the option of a cardiac cath or stress test, he very  strongly refused. He feels that if it cannot be treated medically then it is his time to go.      Past Medical History  Diagnosis Date  . Stroke 2010    "memory problems since"  . Hypertension   . Type II diabetes mellitus      Past Surgical History  Procedure Laterality Date  . Carotid endarterectomy Right 2010    No Known Allergies  I have reviewed the patient's current medications . aspirin  81 mg Oral Daily  . famotidine  20 mg Oral BID  . feeding supplement (ENSURE COMPLETE)  237 mL Oral TID BM  . furosemide  20 mg Intravenous BID  . gabapentin  300 mg Oral TID  . guaiFENesin  600 mg Oral BID  . heparin  5,000 Units Subcutaneous 3 times per day  . insulin aspart  0-15 Units Subcutaneous TID WC  . insulin aspart  0-5 Units Subcutaneous QHS  . lisinopril  10 mg Oral Daily  . simethicone  80 mg Oral QID  . sodium chloride  3 mL Intravenous Q12H  . sodium chloride  3 mL Intravenous Q12H     sodium chloride, acetaminophen, acetaminophen, albuterol, sodium chloride  Prior to Admission medications   Not on File     History   Social History  . Marital Status: Single    Spouse Name: N/A    Number of Children: N/A  . Years of  Education: N/A   Occupational History  . Not on file.   Social History Main Topics  . Smoking status: Current Every Day Smoker -- 0.50 packs/day for 55 years    Types: Cigarettes  . Smokeless tobacco: Never Used  . Alcohol Use: Yes     Comment: "quit drinking in 1976  . Drug Use: No  . Sexual Activity: Not Currently   Other Topics Concern  . Not on file   Social History Narrative  . No narrative on file    No family status information on file.   Family History  Problem Relation Age of Onset  . Kidney disease Mother      ROS:  Full 14 point review of systems complete and found to be negative unless listed above.  Physical Exam: Blood pressure 120/63, pulse 108, temperature 98 F (36.7 C), temperature source Oral, resp. rate  20, height 5\' 10"  (1.778 m), weight 144 lb 2.9 oz (65.4 kg), SpO2 92.00%.  General: Well developed, well nourished, male in no acute distress. Thin and frail  Head: Eyes PERRLA, No xanthomas.   Normocephalic and atraumatic, oropharynx without edema or exudate. Dentition:  Lungs: course breath sounds throughought Heart: HRRR S1 S2, no rub/gallop, +murmur. pulses are 2+ extrem.   Neck: No carotid bruits. No lymphadenopathy. No JVD. Abdomen: Bowel sounds present, abdomen soft and non-tender without masses or hernias noted. Msk:  No spine or cva tenderness. No weakness, no joint deformities or effusions. Extremities: No clubbing or cyanosis.  No edema.  Neuro: Alert and oriented X 3. No focal deficits noted. Psych:  Good affect, responds appropriately Skin: No rashes or lesions noted.  Labs:   Lab Results  Component Value Date   WBC 5.0 08/12/2014   HGB 12.3* 08/12/2014   HCT 36.7* 08/12/2014   MCV 89.5 08/12/2014   PLT 201 08/12/2014    Recent Labs  08/12/14 2335  INR 1.17     Recent Labs Lab 08/13/14 0428  NA 136*  K 4.5  CL 96  CO2 26  BUN 13  CREATININE 1.06  CALCIUM 9.0  GLUCOSE 231*   Magnesium  Date Value Ref Range Status  08/13/2014 1.8  1.5 - 2.5 mg/dL Final    Recent Labs  16/08/9609/08/15 1756 08/12/14 2335 08/13/14 0428  TROPONINI <0.30 <0.30 <0.30    Pro B Natriuretic peptide (BNP)  Date/Time Value Ref Range Status  08/12/2014  5:56 PM 7427.0* 0 - 125 pg/mL Final    Lab Results  Component Value Date   DDIMER 1.03* 08/12/2014    TSH  Date/Time Value Ref Range Status  08/12/2014 11:35 PM 1.190  0.350 - 4.500 uIU/mL Final     Echo: Study Date: 08/13/2014 LV EF: 40% Study Conclusions - Left ventricle: Diffuse hypokinesis possibly worse in inferior wall. The cavity size was mildly dilated. Wall thickness was normal. The estimated ejection fraction was 40%. - Mitral valve: Calcified annulus. There was mild regurgitation. - Left atrium: The atrium was  mildly dilated. - Atrial septum: No defect or patent foramen ovale was identified. - Pulmonary arteries: PA peak pressure: 50 mm Hg (S). - Pericardium, extracardiac: A trivial pericardial effusion was identified.   ECG:  Normal sinus rhythm with sinus arrhythmia T wave abnormality, consider lateral ischemia Prolonged QT Abnormal ECG No old tracing to compare  Radiology:  Dg Chest 2 View  08/12/2014   CLINICAL DATA:  Shortness of breath.  EXAM: CHEST  2 VIEW  COMPARISON:  No prior chest imaging.  FINDINGS: Heart size appears mildly prominent. Pulmonary vascularity appears within normal limits. There are small to moderate posteriorly layering bilateral pleural effusions. Bilateral lower lobe opacities could reflect compressive atelectasis and/or airspace disease. Negative for pneumothorax. Trachea midline. Degenerative changes of the shoulders.  IMPRESSION: Small to moderate bilateral pleural effusions with bilateral lower lobe atelectasis and/or airspace disease.   Electronically Signed   By: Britta Mccreedy M.D.   On: 08/12/2014 18:38   Ct Angio Chest Pe W/cm &/or Wo Cm  08/12/2014   CLINICAL DATA:  Hypoxia, shortness of breath  EXAM: CT ANGIOGRAPHY CHEST WITH CONTRAST  TECHNIQUE: Multidetector CT imaging of the chest was performed using the standard protocol during bolus administration of intravenous contrast. Multiplanar CT image reconstructions and MIPs were obtained to evaluate the vascular anatomy.  CONTRAST:  OMNIPAQUE IOHEXOL 350 MG/ML SOLN  COMPARISON:  None.  FINDINGS: There is adequate opacification of the pulmonary arteries. There is no pulmonary embolus. The main pulmonary artery, right main pulmonary artery and left main pulmonary arteries are normal in size. The heart size is normal. There is no pericardial effusion. There is thoracic aortic atherosclerosis. There is coronary artery atherosclerosis in the left main, lad, circumflex, RCA and first diagonal branch.  There are  moderate bilateral pleural effusions with associated compressive atelectasis. There is no pneumothorax. There is mild interstitial thickening and patchy ground-glass opacities in the upper lobes bilaterally.  There is no axillary, hilar, or mediastinal adenopathy.  There is no lytic or blastic osseous lesion. There are anterior flowing bridging osteophytes involving the thoracic spine as can be seen with diffuse idiopathic skeletal hyperostosis (DISH).  The visualized portions of the upper abdomen are unremarkable.  Review of the MIP images confirms the above findings.  IMPRESSION: 1. No evidence of pulmonary embolus. 2. Moderate bilateral pleural effusions. There is likely underlying pulmonary edema.   Electronically Signed   By: Elige Ko   On: 08/12/2014 20:19    ASSESSMENT AND PLAN:    Principal Problem:   Acute CHF (congestive heart failure) Active Problems:   History of stroke   HTN (hypertension)   Peripheral neuropathy   CHF (congestive heart failure)   Protein-calorie malnutrition, severe   Chronic systolic CHF (congestive heart failure)   Type II diabetes mellitus   Hypertension   Stroke   Russell Hamilton is a 73 y.o. male with a history of stroke, hypertension, diabetes type 2, peripheral neuropathy, who presented to [facility] yesterday with shortness of breath and found to be in an acute CHF exacerbation.  Acute systolic CHF exacerbation- He presented with SOB. CXR w/ bilateral pleural effusion and pulmonary edema. ProBNP 7427. -- Feeling much better after IV Lasix. Consider switching to PO  -- 2D ECHO today with EF 40%, diffuse hypokinesis possibly worse in the inferior wall. Mild MR, mild LA dilation, PA pressure 50. Trivial pericardial effusion.  -- With newly reduced EF in a segmental wall defect, it is worrisome for an ischemic etiology. Normally, would proceed with cardiac cath but patient feels very strongly against cardiac cath or further ischemic work up -- Continued Lisinopril.  Will add low dose of a BB.   RFs for CAD- with HTN, DM, heavy tobacco abuse and family hx of CAD.  -- Troponin neg x3 and ECG with some non-specific ST and TW changes. No chest pain -- Will proceed with medical therapy in line with patient's wishes. He does NOT want further ischemic work up -- Resume ASA, will add low dose  BB -- Will order a lipid panel. Add statin if indicated. Should be on a statin anyway with diabetes   Elevated d-dimer-  CTA is negative for PE.  Tobacco abuse- counseled on cessation  History of stroke  -- Resumed on ASA  DM with peripheral neuropathy -- Was on Metformin. Continue SSI per IM  -- A1c 6.9   CKD. Creat 1.06 and GFR 80.  -- Continue to follow.   Protein-calorie malnutrition, severe  -- Added Glucerna per IM   Signed: Janetta HoraHOMPSON, KATHRYN R, PA-C 08/13/2014 3:23 PM  Pager [phone]  Co-Sign MD  Patient seen, examined. Available data reviewed. Agree with findings, assessment, and plan as outlined by Deborha PaymentKatie Stern, PA-C. The patient was independently interviewed and examined. He is a thin gentleman in no distress. Lungs are clear. Heart is regular rate and rhythm with somewhat distant heart sounds. I do not appreciate any carotid bruits. Legs are thin without edema.  All data reviewed. The patient has segmental LV dysfunction in a pattern consistent with ischemic cardiomyopathy. He has multiple risk factors for coronary artery disease with long-standing diabetes, tobacco abuse, and family history of CAD.  We had a lengthy discussion about options for further evaluation and treatment. He is adamant that he does not want to pursue any ischemic evaluation. I offered him both risk stratification with a nuclear scan more definitive evaluation with cardiac catheterization which I would favor. He declines either modality and wishes to be treated medically.  I reviewed his medications and would recommend increasing his carvedilol to 6.25 mg twice daily as he  remains hypertensive. He should continue on lisinopril. Probably could convert to oral Lasix tomorrow. Suspect he will be ready for hospital discharge in the next 24-48 hours. Would be happy to see him back in the office for ongoing medical therapy for his congestive heart failure and presumed CAD.  Tonny BollmanMichael Torrian Canion, M.D. 08/13/2014 4:46 PM

## 2014-08-14 DIAGNOSIS — R0902 Hypoxemia: Secondary | ICD-10-CM

## 2014-08-14 DIAGNOSIS — E43 Unspecified severe protein-calorie malnutrition: Secondary | ICD-10-CM

## 2014-08-14 DIAGNOSIS — M79609 Pain in unspecified limb: Secondary | ICD-10-CM

## 2014-08-14 DIAGNOSIS — R600 Localized edema: Secondary | ICD-10-CM

## 2014-08-14 DIAGNOSIS — I10 Essential (primary) hypertension: Secondary | ICD-10-CM

## 2014-08-14 LAB — GLUCOSE, CAPILLARY
GLUCOSE-CAPILLARY: 165 mg/dL — AB (ref 70–99)
Glucose-Capillary: 138 mg/dL — ABNORMAL HIGH (ref 70–99)
Glucose-Capillary: 149 mg/dL — ABNORMAL HIGH (ref 70–99)

## 2014-08-14 MED ORDER — FUROSEMIDE 10 MG/ML IJ SOLN: 40.0000 mg | Freq: Once | INTRAMUSCULAR | Status: DC

## 2014-08-14 MED ORDER — CARVEDILOL 6.25 MG PO TABS: 6.2500 mg | ORAL_TABLET | Freq: Two times a day (BID) | ORAL | Status: DC

## 2014-08-14 MED ORDER — LISINOPRIL 10 MG PO TABS: 10.0000 mg | ORAL_TABLET | Freq: Every day | ORAL | Status: DC

## 2014-08-14 MED ORDER — ASPIRIN 81 MG PO CHEW: 81.0000 mg | CHEWABLE_TABLET | Freq: Every day | ORAL | Status: DC

## 2014-08-14 MED ORDER — FUROSEMIDE 40 MG PO TABS
40.0000 mg | ORAL_TABLET | Freq: Every day | ORAL | Status: DC
Start: 2014-08-14 — End: 2014-08-14
  Administered 2014-08-14: 40 mg via ORAL
  Filled 2014-08-14: qty 1

## 2014-08-14 MED ORDER — ALBUTEROL SULFATE HFA 108 (90 BASE) MCG/ACT IN AERS: 2.0000 | INHALATION_SPRAY | Freq: Four times a day (QID) | RESPIRATORY_TRACT | Status: DC | PRN

## 2014-08-14 MED ORDER — METFORMIN HCL 500 MG PO TABS: 500.0000 mg | ORAL_TABLET | Freq: Two times a day (BID) | ORAL | Status: DC

## 2014-08-14 MED ORDER — FUROSEMIDE 40 MG PO TABS: 40.0000 mg | ORAL_TABLET | Freq: Every day | ORAL | Status: DC

## 2014-08-14 MED ORDER — SIMETHICONE 80 MG PO CHEW: 80.0000 mg | CHEWABLE_TABLET | Freq: Four times a day (QID) | ORAL | Status: DC

## 2014-08-14 MED ORDER — GUAIFENESIN ER 600 MG PO TB12: 600.0000 mg | ORAL_TABLET | Freq: Two times a day (BID) | ORAL | Status: DC

## 2014-08-14 MED ORDER — GABAPENTIN 300 MG PO CAPS: 300.0000 mg | ORAL_CAPSULE | Freq: Three times a day (TID) | ORAL | Status: DC

## 2014-08-14 NOTE — Progress Notes (Signed)
Patient is alertx4. Vital signs stable. No complaints of pain. Discharge instructions reviewed with patient. Understanding verbalized. Prescriptions given and reviewed with patient. Understanding verbalized. IV discontinued. Escorted off unit by nursing staff with no signs or symptoms of distress.

## 2014-08-14 NOTE — Progress Notes (Signed)
SATURATION QUALIFICATIONS: (This note is used to comply with regulatory documentation for home oxygen)  Patient Saturations on Room Air at Rest = 94%  Patient Saturations on Room Air while Ambulating = 92%  Patient Saturations on 2 Liters of oxygen while Ambulating = 96%  Please briefly explain why patient needs home oxygen: patient walking on RA starts to feel dizzy and SOB

## 2014-08-14 NOTE — Discharge Summary (Signed)
Physician Discharge Summary  Ebrima Ranta MRN: [id number] DOB/AGE: 02/10/1941 73 y.o.  PCP: No PCP Per Patient   Admit date: 08/12/2014 Discharge date: 08/14/2014  Discharge Diagnoses:      Acute systolic CHF (congestive heart failure), NYHA class 4 Diabetes mellitus   History of stroke   HTN (hypertension)   Peripheral neuropathy   CHF (congestive heart failure)   Protein-calorie malnutrition, severe   Chronic systolic CHF (congestive heart failure)   Type II diabetes mellitus   Hypertension   Stroke  Followup recommendations Followup with PCP in 5-7 days  followup with cardiology in one to 2 weeks BMP, CBC in one week    Medication List         albuterol 108 (90 BASE) MCG/ACT inhaler  Commonly known as:  PROVENTIL HFA;VENTOLIN HFA  Inhale 2 puffs into the lungs every 6 (six) hours as needed for wheezing or shortness of breath.     aspirin 81 MG chewable tablet  Chew 1 tablet (81 mg total) by mouth daily.     carvedilol 6.25 MG tablet  Commonly known as:  COREG  Take 1 tablet (6.25 mg total) by mouth 2 (two) times daily with a meal.     furosemide 40 MG tablet  Commonly known as:  LASIX  Take 1 tablet (40 mg total) by mouth daily.     gabapentin 300 MG capsule  Commonly known as:  NEURONTIN  Take 1 capsule (300 mg total) by mouth 3 (three) times daily.     guaiFENesin 600 MG 12 hr tablet  Commonly known as:  MUCINEX  Take 1 tablet (600 mg total) by mouth 2 (two) times daily.     lisinopril 10 MG tablet  Commonly known as:  PRINIVIL,ZESTRIL  Take 1 tablet (10 mg total) by mouth daily.     metFORMIN 500 MG tablet  Commonly known as:  GLUCOPHAGE  Take 1 tablet (500 mg total) by mouth 2 (two) times daily with a meal.     simethicone 80 MG chewable tablet  Commonly known as:  MYLICON  Chew 1 tablet (80 mg total) by mouth 4 (four) times daily.        Discharge Condition: Stable  Disposition: Final discharge disposition not  confirmed   Consults:  Cardiology   Significant Diagnostic Studies: Dg Chest 2 View  08/12/2014   CLINICAL DATA:  Shortness of breath.  EXAM: CHEST  2 VIEW  COMPARISON:  No prior chest imaging.  FINDINGS: Heart size appears mildly prominent. Pulmonary vascularity appears within normal limits. There are small to moderate posteriorly layering bilateral pleural effusions. Bilateral lower lobe opacities could reflect compressive atelectasis and/or airspace disease. Negative for pneumothorax. Trachea midline. Degenerative changes of the shoulders.  IMPRESSION: Small to moderate bilateral pleural effusions with bilateral lower lobe atelectasis and/or airspace disease.   Electronically Signed   By: Curlene Dolphin M.D.   On: 08/12/2014 18:38   Ct Angio Chest Pe W/cm &/or Wo Cm  08/12/2014   CLINICAL DATA:  Hypoxia, shortness of breath  EXAM: CT ANGIOGRAPHY CHEST WITH CONTRAST  TECHNIQUE: Multidetector CT imaging of the chest was performed using the standard protocol during bolus administration of intravenous contrast. Multiplanar CT image reconstructions and MIPs were obtained to evaluate the vascular anatomy.  CONTRAST:  116m OMNIPAQUE IOHEXOL 350 MG/ML SOLN  COMPARISON:  None.  FINDINGS: There is adequate opacification of the pulmonary arteries. There is no pulmonary embolus. The main pulmonary artery, right main pulmonary artery and  left main pulmonary arteries are normal in size. The heart size is normal. There is no pericardial effusion. There is thoracic aortic atherosclerosis. There is coronary artery atherosclerosis in the left main, lad, circumflex, RCA and first diagonal branch.  There are moderate bilateral pleural effusions with associated compressive atelectasis. There is no pneumothorax. There is mild interstitial thickening and patchy ground-glass opacities in the upper lobes bilaterally.  There is no axillary, hilar, or mediastinal adenopathy.  There is no lytic or blastic osseous lesion. There  are anterior flowing bridging osteophytes involving the thoracic spine as can be seen with diffuse idiopathic skeletal hyperostosis (DISH).  The visualized portions of the upper abdomen are unremarkable.  Review of the MIP images confirms the above findings.  IMPRESSION: 1. No evidence of pulmonary embolus. 2. Moderate bilateral pleural effusions. There is likely underlying pulmonary edema.   Electronically Signed   By: Kathreen Devoid   On: 08/12/2014 20:19      Microbiology: No results found for this or any previous visit (from the past 240 hour(s)).   Labs: Results for orders placed during the hospital encounter of 08/12/14 (from the past 48 hour(s))  CBC WITH DIFFERENTIAL     Status: Abnormal   Collection Time    08/12/14  5:56 PM      Result Value Ref Range   WBC 5.0  4.0 - 10.5 K/uL   RBC 4.10 (*) 4.22 - 5.81 MIL/uL   Hemoglobin 12.3 (*) 13.0 - 17.0 g/dL   HCT 36.7 (*) 39.0 - 52.0 %   MCV 89.5  78.0 - 100.0 fL   MCH 30.0  26.0 - 34.0 pg   MCHC 33.5  30.0 - 36.0 g/dL   RDW 14.9  11.5 - 15.5 %   Platelets 201  150 - 400 K/uL   Neutrophils Relative % 58  43 - 77 %   Neutro Abs 2.9  1.7 - 7.7 K/uL   Lymphocytes Relative 29  12 - 46 %   Lymphs Abs 1.4  0.7 - 4.0 K/uL   Monocytes Relative 11  3 - 12 %   Monocytes Absolute 0.5  0.1 - 1.0 K/uL   Eosinophils Relative 1  0 - 5 %   Eosinophils Absolute 0.0  0.0 - 0.7 K/uL   Basophils Relative 1  0 - 1 %   Basophils Absolute 0.0  0.0 - 0.1 K/uL  BASIC METABOLIC PANEL     Status: Abnormal   Collection Time    08/12/14  5:56 PM      Result Value Ref Range   Sodium 135 (*) 137 - 147 mEq/L   Potassium 4.1  3.7 - 5.3 mEq/L   Chloride 99  96 - 112 mEq/L   CO2 26  19 - 32 mEq/L   Glucose, Bld 126 (*) 70 - 99 mg/dL   BUN 9  6 - 23 mg/dL   Creatinine, Ser 0.93  0.50 - 1.35 mg/dL   Calcium 9.3  8.4 - 10.5 mg/dL   GFR calc non Af Amer 83 (*) >90 mL/min   GFR calc Af Amer >90  >90 mL/min   Comment: (NOTE)     The eGFR has been calculated  using the CKD EPI equation.     This calculation has not been validated in all clinical situations.     eGFR's persistently <90 mL/min signify possible Chronic Kidney     Disease.   Anion gap 10  5 - 15  TROPONIN I  Status: None   Collection Time    08/12/14  5:56 PM      Result Value Ref Range   Troponin I <0.30  <0.30 ng/mL   Comment:            Due to the release kinetics of cTnI,     a negative result within the first hours     of the onset of symptoms does not rule out     myocardial infarction with certainty.     If myocardial infarction is still suspected,     repeat the test at appropriate intervals.  PRO B NATRIURETIC PEPTIDE     Status: Abnormal   Collection Time    08/12/14  5:56 PM      Result Value Ref Range   Pro B Natriuretic peptide (BNP) 7427.0 (*) 0 - 125 pg/mL  D-DIMER, QUANTITATIVE     Status: Abnormal   Collection Time    08/12/14  5:56 PM      Result Value Ref Range   D-Dimer, Quant 1.03 (*) 0.00 - 0.48 ug/mL-FEU   Comment:            AT THE INHOUSE ESTABLISHED CUTOFF     VALUE OF 0.48 ug/mL FEU,     THIS ASSAY HAS BEEN DOCUMENTED     IN THE LITERATURE TO HAVE     A SENSITIVITY AND NEGATIVE     PREDICTIVE VALUE OF AT LEAST     98 TO 99%.  THE TEST RESULT     SHOULD BE CORRELATED WITH     AN ASSESSMENT OF THE CLINICAL     PROBABILITY OF DVT / VTE.  URINE RAPID DRUG SCREEN (HOSP PERFORMED)     Status: None   Collection Time    08/12/14 10:10 PM      Result Value Ref Range   Opiates NONE DETECTED  NONE DETECTED   Cocaine NONE DETECTED  NONE DETECTED   Benzodiazepines NONE DETECTED  NONE DETECTED   Amphetamines NONE DETECTED  NONE DETECTED   Tetrahydrocannabinol NONE DETECTED  NONE DETECTED   Barbiturates NONE DETECTED  NONE DETECTED   Comment:            DRUG SCREEN FOR MEDICAL PURPOSES     ONLY.  IF CONFIRMATION IS NEEDED     FOR ANY PURPOSE, NOTIFY LAB     WITHIN 5 DAYS.                LOWEST DETECTABLE LIMITS     FOR URINE DRUG SCREEN      Drug Class       Cutoff (ng/mL)     Amphetamine      1000     Barbiturate      200     Benzodiazepine   637     Tricyclics       858     Opiates          300     Cocaine          300     THC              50  TROPONIN I     Status: None   Collection Time    08/12/14 11:35 PM      Result Value Ref Range   Troponin I <0.30  <0.30 ng/mL   Comment:            Due to the release kinetics of cTnI,  a negative result within the first hours     of the onset of symptoms does not rule out     myocardial infarction with certainty.     If myocardial infarction is still suspected,     repeat the test at appropriate intervals.  LDL CHOLESTEROL, DIRECT     Status: None   Collection Time    08/12/14 11:35 PM      Result Value Ref Range   Direct LDL 93     Comment: (NOTE)     ATP III Classification (LDL):          < 100        mg/dL         Optimal         100 - 129     mg/dL         Near or Above Optimal         130 - 159     mg/dL         Borderline High         160 - 189     mg/dL         High          > 190        mg/dL         Very High     Performed at [facility]  TSH     Status: None   Collection Time    08/12/14 11:35 PM      Result Value Ref Range   TSH 1.190  0.350 - 4.500 uIU/mL  PROTIME-INR     Status: None   Collection Time    08/12/14 11:35 PM      Result Value Ref Range   Prothrombin Time 14.9  11.6 - 15.2 seconds   INR 1.17  0.00 - 1.61  BASIC METABOLIC PANEL     Status: Abnormal   Collection Time    08/13/14  4:28 AM      Result Value Ref Range   Sodium 136 (*) 137 - 147 mEq/L   Potassium 4.5  3.7 - 5.3 mEq/L   Chloride 96  96 - 112 mEq/L   CO2 26  19 - 32 mEq/L   Glucose, Bld 231 (*) 70 - 99 mg/dL   BUN 13  6 - 23 mg/dL   Creatinine, Ser 1.06  0.50 - 1.35 mg/dL   Calcium 9.0  8.4 - 10.5 mg/dL   GFR calc non Af Amer 69 (*) >90 mL/min   GFR calc Af Amer 80 (*) >90 mL/min   Comment: (NOTE)     The eGFR has been calculated using the CKD EPI  equation.     This calculation has not been validated in all clinical situations.     eGFR's persistently <90 mL/min signify possible Chronic Kidney     Disease.   Anion gap 14  5 - 15  TROPONIN I     Status: None   Collection Time    08/13/14  4:28 AM      Result Value Ref Range   Troponin I <0.30  <0.30 ng/mL   Comment:            Due to the release kinetics of cTnI,     a negative result within the first hours     of the onset of symptoms does not rule out     myocardial infarction with certainty.  If myocardial infarction is still suspected,     repeat the test at appropriate intervals.  MAGNESIUM     Status: None   Collection Time    08/13/14  4:28 AM      Result Value Ref Range   Magnesium 1.8  1.5 - 2.5 mg/dL  HEMOGLOBIN A1C     Status: Abnormal   Collection Time    08/13/14  4:28 AM      Result Value Ref Range   Hemoglobin A1C 6.9 (*) <5.7 %   Comment: (NOTE)                                                                               According to the ADA Clinical Practice Recommendations for 2011, when     HbA1c is used as a screening test:      >=6.5%   Diagnostic of Diabetes Mellitus               (if abnormal result is confirmed)     5.7-6.4%   Increased risk of developing Diabetes Mellitus     References:Diagnosis and Classification of Diabetes Mellitus,Diabetes     ZTIW,5809,98(PJASN 1):S62-S69 and Standards of Medical Care in             Diabetes - 2011,Diabetes Care,2011,34 (Suppl 1):S11-S61.   Mean Plasma Glucose 151 (*) <117 mg/dL   Comment: Performed at [place], [facility]     Status: Abnormal   Collection Time    08/13/14  6:29 AM      Result Value Ref Range   Glucose-Capillary 213 (*) 70 - 99 mg/dL  GLUCOSE, CAPILLARY     Status: Abnormal   Collection Time    08/13/14 11:56 AM      Result Value Ref Range   Glucose-Capillary 234 (*) 70 - 99 mg/dL  GLUCOSE, CAPILLARY     Status: Abnormal   Collection Time    08/13/14  4:09  PM      Result Value Ref Range   Glucose-Capillary 179 (*) 70 - 99 mg/dL   Comment 1 Notify RN    GLUCOSE, CAPILLARY     Status: Abnormal   Collection Time    08/13/14  8:58 PM      Result Value Ref Range   Glucose-Capillary 194 (*) 70 - 99 mg/dL  GLUCOSE, CAPILLARY     Status: Abnormal   Collection Time    08/14/14  5:55 AM      Result Value Ref Range   Glucose-Capillary 165 (*) 70 - 99 mg/dL     HPI :Russell Hamilton is a 73 y.o. male with a history of stroke, hypertension, diabetes type 2, peripheral neuropathy, who presented to [facility] yesterday with shortness of breath and found to be in an acute CHF exacerbation.  Patient recently moved from [place] to [place] to be closer to family. He reports that he has hx of hypertension and diabetes, which were well controlled in the past and therefore he stopped taking medications. "[facility] and told [facility] that was no longer hypertensive and did not need medications anymore." He has not been on any BP meds for over 2 years.  He ran out of his other medications including metformin, gabapentin and ASA about a month ago. He has never been followed by a cardiologist.  Over the past 2 or 3 days, he started having shortness of breath, dry cough orthopnea and PND which prompted him to go to the ED. He has never has anything like this before. He does not have chest pain, fever or chills. He does not have pain over his calf areas. No recent long distance traveling history. He does not have runny nose, sore throat, nausea, vomiting, abdominal pain, diarrhea, dysuria or rashes. He does not have leg edema. He has a family history of CAD in sister and brother who both have had interventions in their 25's and 65s respectively. He also has a long history of heavy tobacco abuse.  In the ED he was found to have elevated d-dimer. CTA negative for PE. Chest x-ray showed bilateral pleural effusion and pulmonary edema. ProBNP 7427. 2D ECHO today with EF 40%, diffuse hypokinesis  possibly worse in the inferior wall. Mild MR, mild LA dilation, PA pressure 50. Trivial pericardial effusion. When discussed the option of a cardiac cath or stress test, he very strongly refused. He feels that if it cannot be treated medically then it is his time to go  HOSPITAL COURSE:   Acute systolic CHF exacerbation- He presented with SOB. CXR w/ bilateral pleural effusion and pulmonary edema. ProBNP 7427.  -- Feeling much better after being initiated on IV Lasix. Cardiology recommends to switch to by mouth Lasix -- 2D ECHO today with EF 40%, diffuse hypokinesis possibly worse in the inferior wall. Mild MR, mild LA dilation, PA pressure 50. Trivial pericardial effusion.  -- With newly reduced EF in a segmental wall defect, it is worrisome for an ischemic etiology. Normally, would proceed with cardiac cath but patient feels very strongly against cardiac cath or further ischemic work up  -- Continued Lisinopril. Continue Coreg Dose adjusted by cardiology Patient to followup with cardiology in one week  Risk factors for CAD- with HTN, DM, heavy tobacco abuse and family hx of CAD.  -- Troponin neg x3 and ECG with some non-specific ST and TW changes. No chest pain  -- Will proceed with medical therapy in line with patient's wishes. He does NOT want further ischemic work up  Continue baby aspirin, beta blocker Started on Lipitor by cardiology, Should be on a statin anyway with diabetes    Elevated d-dimer- CTA is negative for PE.   Tobacco abuse- counseled on cessation   History of stroke  Continue ASA   DM with peripheral neuropathy  -- Was on Metformin. On SSI in the hospital Resume metformin -- A1c 6.9   CKD. Creat 1.06 and GFR 80.  -- Continue to follow. Repeat BMP in one week  Protein-calorie malnutrition, severe  -- Added Glucerna per IM   Discharge Exam:    Blood pressure 110/74, pulse 88, temperature 97.8 F (36.6 C), temperature source Oral, resp. rate 22, height 5' 10"   (1.778 m), weight 64.89 kg (143 lb 0.9 oz), SpO2 100.00%.  General: Thin male, no distress.  Lungs: No rales, decreased breath sounds at the bases.  Cardiac: RRR, no gallop.  Extremities: No pitting edema.        Discharge Instructions   Diet - low sodium heart healthy    Complete by:  As directed      Increase activity slowly    Complete by:  As directed  Follow-up Information   Follow up with pcp. Schedule an appointment as soon as possible for a visit in 1 week.      Follow up with Rozann Lesches, MD. Schedule an appointment as soon as possible for a visit in 1 week.   Specialty:  Cardiology   Contact information:   [place] [address] [phone]       Signed: Reyne Dumas 08/14/2014, 10:40 AM

## 2014-08-14 NOTE — Progress Notes (Signed)
Consulting cardiologist: Dr. Tonny BollmanMichael Hamilton  Subjective:   Patient feels better today. Breathing more easily, no chest pain.   Objective:   Temp:  [97.8 F (36.6 C)-98 F (36.7 C)] 97.8 F (36.6 C) (10/10 0550) Pulse Rate:  [88-108] 88 (10/10 0550) Resp:  [18-22] 22 (10/10 0550) BP: (108-120)/(63-74) 110/74 mmHg (10/10 0550) SpO2:  [88 %-100 %] 100 % (10/10 0550) Weight:  [143 lb 0.9 oz (64.89 kg)] 143 lb 0.9 oz (64.89 kg) (10/10 0550) Last BM Date: 08/13/14  Filed Weights   08/12/14 2214 08/13/14 0413 08/14/14 0550  Weight: 144 lb 3.2 oz (65.409 kg) 144 lb 2.9 oz (65.4 kg) 143 lb 0.9 oz (64.89 kg)    Intake/Output Summary (Last 24 hours) at 08/14/14 0947 Last data filed at 08/14/14 0942  Gross per 24 hour  Intake    720 ml  Output   1250 ml  Net   -530 ml    Telemetry: Sinus rhythm.  Exam:  General: Thin male, no distress.  Lungs: No rales, decreased breath sounds at the bases.  Cardiac: RRR, no gallop.  Extremities: No pitting edema.   Lab Results:  Basic Metabolic Panel:  Recent Labs Lab 08/12/14 1756 08/13/14 0428  NA 135* 136*  K 4.1 4.5  CL 99 96  CO2 26 26  GLUCOSE 126* 231*  BUN 9 13  CREATININE 0.93 1.06  CALCIUM 9.3 9.0  MG  --  1.8    CBC:  Recent Labs Lab 08/12/14 1756  WBC 5.0  HGB 12.3*  HCT 36.7*  MCV 89.5  PLT 201    Cardiac Enzymes:  Recent Labs Lab 08/12/14 1756 08/12/14 2335 08/13/14 0428  TROPONINI <0.30 <0.30 <0.30    BNP:  Recent Labs  08/12/14 1756  PROBNP 7427.0*    Echocardiogram (08/13/14):  Study Conclusions  - Left ventricle: Diffuse hypokinesis possibly worse in inferior wall. The cavity size was mildly dilated. Wall thickness was normal. The estimated ejection fraction was 40%. - Mitral valve: Calcified annulus. There was mild regurgitation. - Left atrium: The atrium was mildly dilated. - Atrial septum: No defect or patent foramen ovale was identified. - Pulmonary arteries: PA  peak pressure: 50 mm Hg (S). - Pericardium, extracardiac: A trivial pericardial effusion was identified.    Medications:   Scheduled Medications: . aspirin  81 mg Oral Daily  . carvedilol  6.25 mg Oral BID WC  . famotidine  20 mg Oral BID  . feeding supplement (ENSURE COMPLETE)  237 mL Oral TID BM  . feeding supplement (GLUCERNA SHAKE)  237 mL Oral TID BM  . furosemide  40 mg Intravenous Once  . gabapentin  300 mg Oral TID  . guaiFENesin  600 mg Oral BID  . heparin  5,000 Units Subcutaneous 3 times per day  . insulin aspart  0-15 Units Subcutaneous TID WC  . insulin aspart  0-5 Units Subcutaneous QHS  . lisinopril  10 mg Oral Daily  . simethicone  80 mg Oral QID  . sodium chloride  3 mL Intravenous Q12H  . sodium chloride  3 mL Intravenous Q12H      PRN Medications:  sodium chloride, acetaminophen, acetaminophen, albuterol, sodium chloride   Assessment:   1. Acute on possibly chronic systolic heart failure. LVEF 40% with diffuse hypokinesis, more prominent in the inferior wall. Possible underlying ischemic cardiomyopathy. Patient declines further risk stratification or invasive workup.  2. Tobacco abuse.  3. Essential hypertension.  4. Type 2 diabetes mellitus.  5. History of stroke.  6. Severe protein calorie malnutrition.   Plan/Discussion:    Current medications include aspirin, Coreg, Lasix 40 milligrams IV daily,, and lisinopril. Weight down 1 pound. Will convert to oral Lasix today. Would have him ambulate. Possible discharge later today or tomorrow. He will need to have followup in the cardiology clinic within a week.   Jonelle SidleSamuel G. Johanthan Hamilton, M.D., F.A.C.C.

## 2014-08-14 NOTE — Progress Notes (Signed)
PT Cancellation Note  Patient Details Name: Russell Hamilton MRN: [id number] DOB: 08/13/1941   Cancelled Treatment:    Reason Eval/Treat Not Completed: PT screened, no needs identified, will sign off. Pt declining PT, stating he is at his baseline of function and does not need therapy. Per OT eval, pt is Independent/Modified Independent with all mobility. Will sign off at this time per pt's request. If needs change, please re-consult.    Russell Hamilton, Russell Hamilton 08/14/2014, 12:09 PM  Russell Hamilton, PT, DPT Acute Rehabilitation Services Pager: [phone]

## 2014-08-14 NOTE — Progress Notes (Addendum)
VASCULAR LAB PRELIMINARY  ARTERIAL  ABI completed:    RIGHT    LEFT    PRESSURE WAVEFORM  PRESSURE WAVEFORM  BRACHIAL 119 triphasic BRACHIAL IV triphasic  DP   DP    AT 95 triphasic AT 102 triphasic  PT 116 triphasic PT 95 triphasic  PER   PER    GREAT TOE 80 adequate GREAT TOE Movements and non compressible artery adequate    RIGHT LEFT  ABI 0.97 0.86     Arantxa Piercey, RVT 08/14/2014, 1:43 PM

## 2014-09-01 ENCOUNTER — Ambulatory Visit (INDEPENDENT_AMBULATORY_CARE_PROVIDER_SITE_OTHER): Payer: Medicare (Managed Care) | Admitting: Cardiology

## 2014-09-01 ENCOUNTER — Encounter: Payer: Self-pay | Admitting: Cardiology

## 2014-09-01 VITALS — BP 90/50 | HR 74 | Ht 70.0 in | Wt 143.0 lb

## 2014-09-01 DIAGNOSIS — I5021 Acute systolic (congestive) heart failure: Secondary | ICD-10-CM

## 2014-09-01 DIAGNOSIS — Z720 Tobacco use: Secondary | ICD-10-CM

## 2014-09-01 MED ORDER — FUROSEMIDE 20 MG PO TABS: 20.0000 mg | ORAL_TABLET | Freq: Every day | ORAL | Status: DC

## 2014-09-01 MED ORDER — LISINOPRIL 5 MG PO TABS
5.0000 mg | ORAL_TABLET | Freq: Every day | ORAL | Status: DC
Start: 2014-09-01 — End: 2014-12-15

## 2014-09-01 NOTE — Progress Notes (Signed)
Patient ID: Russell Hamilton, male   DOB: 09/05/1941, 73 y.o.   MRN: [id number]     Patient Name: Russell Hamilton Date of Encounter: 09/01/2014  Primary Care Provider:  No PCP Per Patient Primary Cardiologist:  Lars MassonNELSON, Shameer Molstad H   Problem List   Past Medical History  Diagnosis Date  . Stroke 2010    "memory problems since"  . Hypertension   . Type II diabetes mellitus   . Chronic systolic CHF (congestive heart failure)     a. 2D ECHO 08/13/14: EF 40%, diffuse hypokinesis possibly worse in the inferior wall. Mild MR, mild LA dilation, PA pressure 50. Trivial pericardial effusion.  . Acute systolic CHF (congestive heart failure), NYHA class 4 08/12/2014   Past Surgical History  Procedure Laterality Date  . Carotid endarterectomy Right 2010    Allergies  No Known Allergies  HPI  is a 73 y.o. male with a history of stroke, hypertension, diabetes type 2, peripheral neuropathy, who [facility] with shortness of breath and found to be in an acute CHF exacerbation.  Patient recently moved from [place] to [place] to be closer to family. He reports that he has hx of hypertension and diabetes, which were well controlled in the past and therefore he stopped taking medications. "[facility] that was no longer hypertensive and did not need medications anymore." He has not been on any BP meds for over 2 years. He ran out of his other medications including metformin, gabapentin and ASA about a month ago. He has never been followed by a cardiologist.  He was hospitalized on August 13, 2014 for worsening shortness of breath, dry cough orthopnea and PND which prompted him to go to the ED. He has never has anything like this before. He does not have chest pain, fever or chills. He does not have pain over his calf areas. No recent long distance traveling history. He does not have runny nose, sore throat, nausea, vomiting, abdominal pain, diarrhea, dysuria or rashes. He does not have leg  edema. He has a family history of CAD in sister and brother who both have had interventions in their 8450's and 1660s respectively. He also has a long history of heavy tobacco abuse.  In the ED he was found to have elevated d-dimer. CTA negative for PE. Chest x-ray showed bilateral pleural effusion and pulmonary edema. ProBNP 7427. 2D ECHO today with EF 40%, diffuse hypokinesis possibly worse in the inferior wall. Mild MR, mild LA dilation, PA pressure 50. Trivial pericardial effusion. When discussed the option of a cardiac cath or stress test, he very strongly refused. He feels that if it cannot be treated medically then it is his time to go.  He didn't want to pursue any ischemic evaluation.  His medical management was intensified with increase carvedilol to 6.25 mg twice daily and continuation of lisinopril. He was diuresed with IV Lasix. The patient live from the hospital AMA.  09/01/2014 - patient states he has been compliant medications and since he left the hospital all of his problems resolved including dyspnea on exertion lower extremity edema and orthopnea. He feels pretty good other than today he feels weak and little bit lightheaded. He states that he woke up late and didn't have time to eat prior to coming to her office. He denies any syncope.  Home Medications  Prior to Admission medications   Medication Sig Start Date End Date Taking? Authorizing Provider  albuterol (PROVENTIL HFA;VENTOLIN  HFA) 108 (90 BASE) MCG/ACT inhaler Inhale 2 puffs into the lungs every 6 (six) hours as needed for wheezing or shortness of breath. 08/14/14   Richarda OverlieNayana Abrol, MD  aspirin 81 MG chewable tablet Chew 1 tablet (81 mg total) by mouth daily. 08/14/14   Richarda OverlieNayana Abrol, MD  carvedilol (COREG) 6.25 MG tablet Take 1 tablet (6.25 mg total) by mouth 2 (two) times daily with a meal. 08/14/14   Richarda OverlieNayana Abrol, MD  furosemide (LASIX) 40 MG tablet Take 1 tablet (40 mg total) by mouth daily. 08/14/14   Richarda OverlieNayana Abrol, MD    gabapentin (NEURONTIN) 300 MG capsule Take 1 capsule (300 mg total) by mouth 3 (three) times daily. 08/14/14   Richarda OverlieNayana Abrol, MD  guaiFENesin (MUCINEX) 600 MG 12 hr tablet Take 1 tablet (600 mg total) by mouth 2 (two) times daily. 08/14/14   Richarda OverlieNayana Abrol, MD  lisinopril (PRINIVIL,ZESTRIL) 10 MG tablet Take 1 tablet (10 mg total) by mouth daily. 08/14/14   Richarda OverlieNayana Abrol, MD  metFORMIN (GLUCOPHAGE) 500 MG tablet Take 1 tablet (500 mg total) by mouth 2 (two) times daily with a meal. 08/14/14   Richarda OverlieNayana Abrol, MD  simethicone (MYLICON) 80 MG chewable tablet Chew 1 tablet (80 mg total) by mouth 4 (four) times daily. 08/14/14   Richarda OverlieNayana Abrol, MD   Family History  Family History  Problem Relation Age of Onset  . Kidney disease Mother    Social History  History   Social History  . Marital Status: Single    Spouse Name: N/A    Number of Children: N/A  . Years of Education: N/A   Occupational History  . Not on file.   Social History Main Topics  . Smoking status: Current Every Day Smoker -- 0.50 packs/day for 55 years    Types: Cigarettes  . Smokeless tobacco: Never Used  . Alcohol Use: Yes     Comment: "quit drinking in 1976  . Drug Use: No  . Sexual Activity: Not Currently   Other Topics Concern  . Not on file   Social History Narrative  . No narrative on file    Review of Systems, as per HPI, otherwise negative General:  No chills, fever, night sweats or weight changes.  Cardiovascular:  No chest pain, dyspnea on exertion, edema, orthopnea, palpitations, paroxysmal nocturnal dyspnea. Dermatological: No rash, lesions/masses Respiratory: No cough, dyspnea Urologic: No hematuria, dysuria Abdominal:   No nausea, vomiting, diarrhea, bright red blood per rectum, melena, or hematemesis Neurologic:  No visual changes, wkns, changes in mental status. All other systems reviewed and are otherwise negative except as noted above.  Physical Exam  BP 90/50 mmHg, HR 74 BPM, Weight 143 lb  (64.864 kg).  General: Pleasant, NAD, appears cachectic Psych: Normal affect. Neuro: Alert and oriented X 3. Moves all extremities spontaneously. HEENT: Normal  Neck: Supple without bruits or JVD. Lungs:  Resp regular and unlabored, CTA. Heart: RRR no s3, s4, or murmurs. Abdomen: Soft, non-tender, non-distended, BS + x 4.  Extremities: No clubbing, cyanosis or edema. DP/PT/Radials 2+ and equal bilaterally.  Labs:  No results found for this basename: CKTOTAL, CKMB, TROPONINI,  in the last 72 hours Lab Results  Component Value Date   WBC 5.0 08/12/2014   HGB 12.3* 08/12/2014   HCT 36.7* 08/12/2014   MCV 89.5 08/12/2014   PLT 201 08/12/2014    Lab Results  Component Value Date   DDIMER 1.03* 08/12/2014   No components found with this basename: POCBNP,  Component Value Date/Time   NA 136* 08/13/2014 0428   K 4.5 08/13/2014 0428   CL 96 08/13/2014 0428   CO2 26 08/13/2014 0428   GLUCOSE 231* 08/13/2014 0428   BUN 13 08/13/2014 0428   CREATININE 1.06 08/13/2014 0428   CALCIUM 9.0 08/13/2014 0428   GFRNONAA 69* 08/13/2014 0428   GFRAA 80* 08/13/2014 0428   No results found for this basename: CHOL, HDL, LDLCALC, TRIG   Accessory Clinical Findings  Echocardiogram - 08/13/2014 - Left ventricle: Diffuse hypokinesis possibly worse in inferior wall. The cavity size was mildly dilated. Wall thickness was normal. The estimated ejection fraction was 40%. - Mitral valve: Calcified annulus. There was mild regurgitation. - Left atrium: The atrium was mildly dilated. - Atrial septum: No defect or patent foramen ovale was identified. - Pulmonary arteries: PA peak pressure: 50 mm Hg (S). - Pericardium, extracardiac: A trivial pericardial effusion was identified.  ECG - SR, LVH, STD and negative T waves in the lateral leads, consider lateral ischemia    Assessment & Plan  Yanky Vanderburg is a 73 y.o. male with a history of stroke, hypertension, diabetes type 2, peripheral neuropathy, who is coming  for the first posthospitalization visit for acute systolic CHF exacerbation.   1. Acute systolic CHF exacerbation- He presented with SOB. CXR w/ bilateral pleural effusion and pulmonary edema. ProBNP 7427.  -- Feeling back to baseline and in fact hypotensive today we will hold Lasix for 1 week then restart 20 mg daily. -- 2D ECHO today with EF 40%, diffuse hypokinesis possibly worse in the inferior wall. Mild MR, mild LA dilation, PA pressure 50. Trivial pericardial effusion. He refused ischemia testing of left AMA because of that from the hospital. -- With newly reduced EF in a segmental wall defect, it is worrisome for an ischemic etiology. Normally, would proceed with cardiac cath but patient feels very strongly against cardiac cath or further ischemic work up  -- Because of hypotension we will decrease Lisinopril dose to 5 mg daily. We will continue the same dose of carvedilol.  The patient refused to have his labs checked today ideally I will check his creatinine and potassium and BNP.   2. RFs for CAD- with HTN, DM, heavy tobacco abuse and family hx of CAD.  -- Troponin neg x3 and ECG with some non-specific ST and TW changes. No chest pain  -- Will proceed with medical therapy in line with patient's wishes. He does NOT want further ischemic work up  -- Resume ASA, will add low dose BB  -- Will order a lipid panel. Add statin if indicated. Should be on a statin anyway with diabetes   3. Elevated d-dimer- CTA is negative for PE.   4. Tobacco abuse- counseled on cessation   5. History of stroke  -- Resumed on ASA   6. DM with peripheral neuropathy  -- Was on Metformin. Continue SSI per IM  -- A1c 6.9  CKD. Creat 1.06 and GFR 80.  -- Continue to follow.  Protein-calorie malnutrition, severe  -- Added Glucerna per IM  Follow u in 2 months with BPM and BNP.    Lars Masson, MD, [facility] 09/01/2014, 11:52 AM

## 2014-09-01 NOTE — Patient Instructions (Signed)
Your physician has recommended you make the following change in your medication:   HOLD YOUR LASIX FOR ONE WEEK ONLY THEN IT WILL DECREASE TO LASIX 20 MG DAILY THEREAFTER  DECREASE YOUR LISINOPRIL 5 MG DAILY      Your physician recommends that you schedule a follow-up appointment in: 2 MONTHS WITH DR Delton SeeNELSON

## 2014-10-27 ENCOUNTER — Ambulatory Visit (INDEPENDENT_AMBULATORY_CARE_PROVIDER_SITE_OTHER): Payer: Medicare (Managed Care) | Admitting: Cardiology

## 2014-10-27 ENCOUNTER — Encounter: Payer: Self-pay | Admitting: Cardiology

## 2014-10-27 VITALS — BP 138/70 | HR 92 | Ht 70.0 in | Wt 151.8 lb

## 2014-10-27 DIAGNOSIS — I5022 Chronic systolic (congestive) heart failure: Secondary | ICD-10-CM

## 2014-10-27 NOTE — Progress Notes (Signed)
Patient ID: Nat MathJames Wandrey, male   DOB: 05/21/1941, 73 y.o.   MRN: [id number] Patient ID: Nat MathJames Lundin, male   DOB: 09/04/1941, 73 y.o.   MRN: [id number]     Patient Name: Nat MathJames Pfannenstiel Date of Encounter: 10/27/2014  Primary Care Provider:  No PCP Per Patient Primary Cardiologist:  Lars MassonNELSON, Verlie Hellenbrand H   Problem List   Past Medical History  Diagnosis Date  . Stroke 2010    "memory problems since"  . Hypertension   . Type II diabetes mellitus   . Chronic systolic CHF (congestive heart failure)     a. 2D ECHO 08/13/14: EF 40%, diffuse hypokinesis possibly worse in the inferior wall. Mild MR, mild LA dilation, PA pressure 50. Trivial pericardial effusion.  . Acute systolic CHF (congestive heart failure), NYHA class 4 08/12/2014   Past Surgical History  Procedure Laterality Date  . Carotid endarterectomy Right 2010    Allergies  No Known Allergies  HPI  is a 73 y.o. male with a history of stroke, hypertension, diabetes type 2, peripheral neuropathy, who [facility] with shortness of breath and found to be in an acute CHF exacerbation.  Patient recently moved from [place] to [place] to be closer to family. He reports that he has hx of hypertension and diabetes, which were well controlled in the past and therefore he stopped taking medications. "[facility] and [facility] that was no longer hypertensive and did not need medications anymore." He has not been on any BP meds for over 2 years. He ran out of his other medications including metformin, gabapentin and ASA about a month ago. He has never been followed by a cardiologist.  He was hospitalized on August 13, 2014 for worsening shortness of breath, dry cough orthopnea and PND which prompted him to go to the ED. He has never has anything like this before. He does not have chest pain, fever or chills. He does not have pain over his calf areas. No recent long distance traveling history. He does not have runny nose, sore throat, nausea,  vomiting, abdominal pain, diarrhea, dysuria or rashes. He does not have leg edema. He has a family history of CAD in sister and brother who both have had interventions in their 3350's and 6360s respectively. He also has a long history of heavy tobacco abuse.  In the ED he was found to have elevated d-dimer. CTA negative for PE. Chest x-ray showed bilateral pleural effusion and pulmonary edema. ProBNP 7427. 2D ECHO today with EF 40%, diffuse hypokinesis possibly worse in the inferior wall. Mild MR, mild LA dilation, PA pressure 50. Trivial pericardial effusion. When discussed the option of a cardiac cath or stress test, he very strongly refused. He feels that if it cannot be treated medically then it is his time to go.  He didn't want to pursue any ischemic evaluation.  His medical management was intensified with increase carvedilol to 6.25 mg twice daily and continuation of lisinopril. He was diuresed with IV Lasix. The patient live from the hospital AMA.  09/01/2014 - patient states he has been compliant medications and since he left the hospital all of his problems resolved including dyspnea on exertion lower extremity edema and orthopnea. He feels pretty good other than today he feels weak and little bit lightheaded. He states that he woke up late and didn't have time to eat prior to coming to her office. He denies any syncope.  10/27/2014 - the patient states that his  shortness of breath has completely resolved and he feels well. He denies any chest pain palpitations or syncope. He has no orthopnea or personal nocturnal dyspnea. He is not interested in having his labs done.  Home Medications  Prior to Admission medications   Medication Sig Start Date End Date Taking? Authorizing Provider  albuterol (PROVENTIL HFA;VENTOLIN HFA) 108 (90 BASE) MCG/ACT inhaler Inhale 2 puffs into the lungs every 6 (six) hours as needed for wheezing or shortness of breath. 08/14/14   Richarda Overlie, MD  aspirin 81 MG  chewable tablet Chew 1 tablet (81 mg total) by mouth daily. 08/14/14   Richarda Overlie, MD  carvedilol (COREG) 6.25 MG tablet Take 1 tablet (6.25 mg total) by mouth 2 (two) times daily with a meal. 08/14/14   Richarda Overlie, MD  furosemide (LASIX) 40 MG tablet Take 1 tablet (40 mg total) by mouth daily. 08/14/14   Richarda Overlie, MD  gabapentin (NEURONTIN) 300 MG capsule Take 1 capsule (300 mg total) by mouth 3 (three) times daily. 08/14/14   Richarda Overlie, MD  guaiFENesin (MUCINEX) 600 MG 12 hr tablet Take 1 tablet (600 mg total) by mouth 2 (two) times daily. 08/14/14   Richarda Overlie, MD  lisinopril (PRINIVIL,ZESTRIL) 10 MG tablet Take 1 tablet (10 mg total) by mouth daily. 08/14/14   Richarda Overlie, MD  metFORMIN (GLUCOPHAGE) 500 MG tablet Take 1 tablet (500 mg total) by mouth 2 (two) times daily with a meal. 08/14/14   Richarda Overlie, MD  simethicone (MYLICON) 80 MG chewable tablet Chew 1 tablet (80 mg total) by mouth 4 (four) times daily. 08/14/14   Richarda Overlie, MD   Family History  Family History  Problem Relation Age of Onset  . Kidney disease Mother    Social History  History   Social History  . Marital Status: Single    Spouse Name: N/A    Number of Children: N/A  . Years of Education: N/A   Occupational History  . Not on file.   Social History Main Topics  . Smoking status: Current Every Day Smoker -- 0.50 packs/day for 55 years    Types: Cigarettes  . Smokeless tobacco: Never Used  . Alcohol Use: Yes     Comment: "quit drinking in 1976  . Drug Use: No  . Sexual Activity: Not Currently   Other Topics Concern  . Not on file   Social History Narrative    Review of Systems, as per HPI, otherwise negative General:  No chills, fever, night sweats or weight changes.  Cardiovascular:  No chest pain, dyspnea on exertion, edema, orthopnea, palpitations, paroxysmal nocturnal dyspnea. Dermatological: No rash, lesions/masses Respiratory: No cough, dyspnea Urologic: No hematuria,  dysuria Abdominal:   No nausea, vomiting, diarrhea, bright red blood per rectum, melena, or hematemesis Neurologic:  No visual changes, wkns, changes in mental status. All other systems reviewed and are otherwise negative except as noted above.  Physical Exam  BP 90/50 mmHg, HR 74 BPM, Blood pressure 138/70, pulse 92, height 5\' 10"  (1.778 m), weight 151 lb 12.8 oz (68.856 kg).  General: Pleasant, NAD, appears cachectic Psych: Normal affect. Neuro: Alert and oriented X 3. Moves all extremities spontaneously. HEENT: Normal  Neck: Supple without bruits or JVD. Lungs:  Resp regular and unlabored, CTA. Heart: RRR no s3, s4, or murmurs. Abdomen: Soft, non-tender, non-distended, BS + x 4.  Extremities: No clubbing, cyanosis or edema. DP/PT/Radials 2+ and equal bilaterally.  Labs:  No results for input(s): CKTOTAL, CKMB, TROPONINI in  the last 72 hours. Lab Results  Component Value Date   WBC 5.0 08/12/2014   HGB 12.3* 08/12/2014   HCT 36.7* 08/12/2014   MCV 89.5 08/12/2014   PLT 201 08/12/2014    Lab Results  Component Value Date   DDIMER 1.03* 08/12/2014   Invalid input(s): POCBNP    Component Value Date/Time   NA 136* 08/13/2014 0428   K 4.5 08/13/2014 0428   CL 96 08/13/2014 0428   CO2 26 08/13/2014 0428   GLUCOSE 231* 08/13/2014 0428   BUN 13 08/13/2014 0428   CREATININE 1.06 08/13/2014 0428   CALCIUM 9.0 08/13/2014 0428   GFRNONAA 69* 08/13/2014 0428   GFRAA 80* 08/13/2014 0428   No results found for: CHOL Accessory Clinical Findings  Echocardiogram - 08/13/2014 - Left ventricle: Diffuse hypokinesis possibly worse in inferior wall. The cavity size was mildly dilated. Wall thickness was normal. The estimated ejection fraction was 40%. - Mitral valve: Calcified annulus. There was mild regurgitation. - Left atrium: The atrium was mildly dilated. - Atrial septum: No defect or patent foramen ovale was identified. - Pulmonary arteries: PA peak pressure: 50 mm Hg (S). -  Pericardium, extracardiac: A trivial pericardial effusion was identified.  ECG - SR, LVH, STD and negative T waves in the lateral leads, consider lateral ischemia    Assessment & Plan  Nat MathJames Vadala is a 73 y.o. male with a history of stroke, hypertension, diabetes type 2, peripheral neuropathy, who is coming for the first posthospitalization visit for acute systolic CHF exacerbation.   1. Acute systolic CHF exacerbation- He presented with SOB. CXR w/ bilateral pleural effusion and pulmonary edema. ProBNP 7427.  -- The patient is now euvolemic, will continue the same management. We are unable to check his labs as he refuses any of them. This is little bit worse since he has been started on lisinopril and he is on furosemide as well. He denies any palpitations or syncope. -- 2D ECHO today with EF 40%, diffuse hypokinesis possibly worse in the inferior wall. Mild MR, mild LA dilation, PA pressure 50.  We will try to repeat at the next visit in 6 months if patient is okay with that.  2. RFs for CAD- with HTN, DM, heavy tobacco abuse and family hx of CAD.  -- Troponin neg x3 and ECG with some non-specific ST and TW changes. No chest pain  -- Will proceed with medical therapy in line with patient's wishes. He does NOT want further ischemic work up  -- Resume ASA, will add low dose BB  -- Will order a lipid panel. Add statin if indicated. Should be on a statin anyway with diabetes   3. Tobacco abuse- counseled on cessation   4. History of stroke  --  on ASA     Follow-up in 3 months.   Lars MassonNELSON, Vantasia Pinkney H, MD, [facility] 10/27/2014, 4:07 PM

## 2014-10-27 NOTE — Patient Instructions (Signed)
Your physician recommends that you continue on your current medications as directed. Please refer to the Current Medication list given to you today.     Your physician wants you to follow-up in: 6 MONTHS WITH DR NELSON You will receive a reminder letter in the mail two months in advance. If you don't receive a letter, please call our office to schedule the follow-up appointment.  

## 2014-11-19 ENCOUNTER — Encounter (HOSPITAL_COMMUNITY): Payer: Self-pay | Admitting: *Deleted

## 2014-11-19 ENCOUNTER — Inpatient Hospital Stay (HOSPITAL_COMMUNITY)
Admission: EM | Admit: 2014-11-19 | Discharge: 2014-11-23 | DRG: 280 | Disposition: A | Discharge status: Home / Self Care | Payer: Medicare (Managed Care) | Attending: Internal Medicine | Admitting: Internal Medicine

## 2014-11-19 ENCOUNTER — Emergency Department (HOSPITAL_COMMUNITY): Payer: Medicare (Managed Care)

## 2014-11-19 DIAGNOSIS — D649 Anemia, unspecified: Secondary | ICD-10-CM | POA: Diagnosis present

## 2014-11-19 DIAGNOSIS — J90 Pleural effusion, not elsewhere classified: Secondary | ICD-10-CM

## 2014-11-19 DIAGNOSIS — I10 Essential (primary) hypertension: Secondary | ICD-10-CM | POA: Diagnosis present

## 2014-11-19 DIAGNOSIS — G629 Polyneuropathy, unspecified: Secondary | ICD-10-CM

## 2014-11-19 DIAGNOSIS — I214 Non-ST elevation (NSTEMI) myocardial infarction: Principal | ICD-10-CM | POA: Diagnosis present

## 2014-11-19 DIAGNOSIS — E43 Unspecified severe protein-calorie malnutrition: Secondary | ICD-10-CM | POA: Diagnosis present

## 2014-11-19 DIAGNOSIS — E119 Type 2 diabetes mellitus without complications: Secondary | ICD-10-CM | POA: Diagnosis present

## 2014-11-19 DIAGNOSIS — N179 Acute kidney failure, unspecified: Secondary | ICD-10-CM | POA: Diagnosis not present

## 2014-11-19 DIAGNOSIS — R7989 Other specified abnormal findings of blood chemistry: Secondary | ICD-10-CM

## 2014-11-19 DIAGNOSIS — R0902 Hypoxemia: Secondary | ICD-10-CM | POA: Diagnosis present

## 2014-11-19 DIAGNOSIS — R0602 Shortness of breath: Secondary | ICD-10-CM | POA: Diagnosis not present

## 2014-11-19 DIAGNOSIS — I5023 Acute on chronic systolic (congestive) heart failure: Secondary | ICD-10-CM | POA: Diagnosis present

## 2014-11-19 DIAGNOSIS — J9601 Acute respiratory failure with hypoxia: Secondary | ICD-10-CM | POA: Diagnosis present

## 2014-11-19 DIAGNOSIS — E1165 Type 2 diabetes mellitus with hyperglycemia: Secondary | ICD-10-CM

## 2014-11-19 DIAGNOSIS — Z8673 Personal history of transient ischemic attack (TIA), and cerebral infarction without residual deficits: Secondary | ICD-10-CM

## 2014-11-19 DIAGNOSIS — Z6823 Body mass index (BMI) 23.0-23.9, adult: Secondary | ICD-10-CM

## 2014-11-19 DIAGNOSIS — I509 Heart failure, unspecified: Secondary | ICD-10-CM

## 2014-11-19 DIAGNOSIS — I429 Cardiomyopathy, unspecified: Secondary | ICD-10-CM | POA: Diagnosis present

## 2014-11-19 DIAGNOSIS — I2510 Atherosclerotic heart disease of native coronary artery without angina pectoris: Secondary | ICD-10-CM | POA: Diagnosis present

## 2014-11-19 DIAGNOSIS — I5021 Acute systolic (congestive) heart failure: Secondary | ICD-10-CM

## 2014-11-19 DIAGNOSIS — Z9119 Patient's noncompliance with other medical treatment and regimen: Secondary | ICD-10-CM | POA: Diagnosis present

## 2014-11-19 DIAGNOSIS — L97509 Non-pressure chronic ulcer of other part of unspecified foot with unspecified severity: Secondary | ICD-10-CM | POA: Diagnosis present

## 2014-11-19 DIAGNOSIS — I639 Cerebral infarction, unspecified: Secondary | ICD-10-CM

## 2014-11-19 DIAGNOSIS — E876 Hypokalemia: Secondary | ICD-10-CM | POA: Diagnosis present

## 2014-11-19 DIAGNOSIS — F17210 Nicotine dependence, cigarettes, uncomplicated: Secondary | ICD-10-CM | POA: Diagnosis present

## 2014-11-19 DIAGNOSIS — Z7982 Long term (current) use of aspirin: Secondary | ICD-10-CM

## 2014-11-19 DIAGNOSIS — R778 Other specified abnormalities of plasma proteins: Secondary | ICD-10-CM

## 2014-11-19 DIAGNOSIS — L03119 Cellulitis of unspecified part of limb: Secondary | ICD-10-CM | POA: Diagnosis present

## 2014-11-19 DIAGNOSIS — IMO0002 Reserved for concepts with insufficient information to code with codable children: Secondary | ICD-10-CM

## 2014-11-19 DIAGNOSIS — E1129 Type 2 diabetes mellitus with other diabetic kidney complication: Secondary | ICD-10-CM

## 2014-11-19 DIAGNOSIS — L97319 Non-pressure chronic ulcer of right ankle with unspecified severity: Secondary | ICD-10-CM | POA: Diagnosis present

## 2014-11-19 DIAGNOSIS — I5022 Chronic systolic (congestive) heart failure: Secondary | ICD-10-CM

## 2014-11-19 DIAGNOSIS — E039 Hypothyroidism, unspecified: Secondary | ICD-10-CM | POA: Diagnosis present

## 2014-11-19 LAB — CBC WITH DIFFERENTIAL/PLATELET
Basophils Absolute: 0 10*3/uL (ref 0.0–0.1)
Basophils Relative: 0 % (ref 0–1)
Eosinophils Absolute: 0.1 10*3/uL (ref 0.0–0.7)
Eosinophils Relative: 1 % (ref 0–5)
HEMATOCRIT: 35.3 % — AB (ref 39.0–52.0)
HEMOGLOBIN: 11.4 g/dL — AB (ref 13.0–17.0)
LYMPHS ABS: 1.3 10*3/uL (ref 0.7–4.0)
LYMPHS PCT: 22 % (ref 12–46)
MCH: 30.2 pg (ref 26.0–34.0)
MCHC: 32.3 g/dL (ref 30.0–36.0)
MCV: 93.6 fL (ref 78.0–100.0)
MONO ABS: 0.7 10*3/uL (ref 0.1–1.0)
MONOS PCT: 11 % (ref 3–12)
Neutro Abs: 3.9 10*3/uL (ref 1.7–7.7)
Neutrophils Relative %: 66 % (ref 43–77)
Platelets: 188 10*3/uL (ref 150–400)
RBC: 3.77 MIL/uL — ABNORMAL LOW (ref 4.22–5.81)
RDW: 14.9 % (ref 11.5–15.5)
WBC: 5.9 10*3/uL (ref 4.0–10.5)

## 2014-11-19 MED ORDER — MORPHINE SULFATE 4 MG/ML IJ SOLN
4.0000 mg | Freq: Once | INTRAMUSCULAR | Status: AC
Start: 2014-11-19 — End: 2014-11-19
  Administered 2014-11-19: 4 mg via INTRAVENOUS
  Filled 2014-11-19: qty 1

## 2014-11-19 NOTE — ED Notes (Signed)
Dolores Hooseric Amos, nephew [phone]

## 2014-11-19 NOTE — ED Provider Notes (Signed)
CSN: [id number]     Arrival date & time 11/19/14  2007 History   First MD Initiated Contact with Patient 11/19/14 2135     Chief Complaint  Patient presents with  . Shortness of Breath  . Cough     (Consider location/radiation/quality/duration/timing/severity/associated sxs/prior Treatment) HPI   Patient with hx HTN, DM, CHF (EF 40%), stroke p/w increased SOB and productive cough x 2 days.  Has orthopnea and increased peripheral edema in his feet and ankles.  Denies fevers, URI symptoms, chest or abdominal pain.  Is taking all of his home medications, no recent changes.    Past Medical History  Diagnosis Date  . Stroke 2010    "memory problems since"  . Hypertension   . Type II diabetes mellitus   . Chronic systolic CHF (congestive heart failure)     a. 2D ECHO 08/13/14: EF 40%, diffuse hypokinesis possibly worse in the inferior wall. Mild MR, mild LA dilation, PA pressure 50. Trivial pericardial effusion.  . Acute systolic CHF (congestive heart failure), NYHA class 4 08/12/2014   Past Surgical History  Procedure Laterality Date  . Carotid endarterectomy Right 2010   Family History  Problem Relation Age of Onset  . Kidney disease Mother    History  Substance Use Topics  . Smoking status: Current Every Day Smoker -- 0.50 packs/day for 56 years    Types: Cigarettes  . Smokeless tobacco: Never Used  . Alcohol Use: No     Comment: "quit drinking in 1976    Review of Systems  All other systems reviewed and are negative.     Allergies  Review of patient's allergies indicates no known allergies.  Home Medications   Prior to Admission medications   Medication Sig Start Date End Date Taking? Authorizing Provider  aspirin 81 MG chewable tablet Chew 1 tablet (81 mg total) by mouth daily. Patient not taking: Reported on 10/27/2014 08/14/14   Richarda Overlie, MD  carvedilol (COREG) 6.25 MG tablet Take 1 tablet (6.25 mg total) by mouth 2 (two) times daily with a meal. Patient  not taking: Reported on 10/27/2014 08/14/14   Richarda Overlie, MD  furosemide (LASIX) 20 MG tablet Take 1 tablet (20 mg total) by mouth daily. Patient not taking: Reported on 10/27/2014 09/01/14   Lars Masson, MD  gabapentin (NEURONTIN) 300 MG capsule Take 1 capsule (300 mg total) by mouth 3 (three) times daily. 08/14/14   Richarda Overlie, MD  guaiFENesin (MUCINEX) 600 MG 12 hr tablet Take 1 tablet (600 mg total) by mouth 2 (two) times daily. 08/14/14   Richarda Overlie, MD  lisinopril (PRINIVIL,ZESTRIL) 5 MG tablet Take 1 tablet (5 mg total) by mouth daily. Patient not taking: Reported on 10/27/2014 09/01/14   Lars Masson, MD  metFORMIN (GLUCOPHAGE) 500 MG tablet Take 1 tablet (500 mg total) by mouth 2 (two) times daily with a meal. Patient not taking: Reported on 10/27/2014 08/14/14   Richarda Overlie, MD  simethicone (MYLICON) 80 MG chewable tablet Chew 1 tablet (80 mg total) by mouth 4 (four) times daily. Patient not taking: Reported on 10/27/2014 08/14/14   Richarda Overlie, MD   BP 151/96 mmHg  Pulse 103  Temp(Src) 98 F (36.7 C) (Oral)  Resp 27  Ht  (1.753 m)  Wt 150 lb (68.04 kg)  BMI 22.14 kg/m2  SpO2 95% Physical Exam  Constitutional: He appears well-developed and well-nourished. No distress.  HENT:  Head: Normocephalic and atraumatic.  Neck: Neck supple.  Cardiovascular: Normal  rate and regular rhythm.   Pulmonary/Chest: Effort normal. No respiratory distress. He has decreased breath sounds. He has no wheezes. He has no rhonchi. He has no rales.  Diminished/absent breath sounds at bilateral bases.   Abdominal: Soft. He exhibits no distension and no mass. There is no tenderness. There is no rebound and no guarding.  Musculoskeletal:  Pt refuses to remove his socks and shoes for examination.  No edema of the lower legs.    Neurological: He is alert. He exhibits normal muscle tone.  Skin: He is not diaphoretic.  Nursing note and vitals reviewed.   ED Course  Procedures  (including critical care time) Labs Review Labs Reviewed  COMPREHENSIVE METABOLIC PANEL - Abnormal; Notable for the following:    Glucose, Bld 145 (*)    Albumin 3.3 (*)    Alkaline Phosphatase 154 (*)    GFR calc non Af Amer 67 (*)    GFR calc Af Amer 78 (*)    Anion gap 3 (*)    All other components within normal limits  CBC WITH DIFFERENTIAL - Abnormal; Notable for the following:    RBC 3.77 (*)    Hemoglobin 11.4 (*)    HCT 35.3 (*)    All other components within normal limits  BRAIN NATRIURETIC PEPTIDE - Abnormal; Notable for the following:    B Natriuretic Peptide 1565.5 (*)    All other components within normal limits  TROPONIN I - Abnormal; Notable for the following:    Troponin I 1.17 (*)    All other components within normal limits    Imaging Review Dg Chest 2 View  11/19/2014   CLINICAL DATA:  Two day history of productive cough and shortness of breath. Current history of hypertension. Prior episodes of CHF.  EXAM: CHEST  2 VIEW  COMPARISON:  Two-view chest x-ray and CTA chest 08/12/2014.  FINDINGS: Cardiac silhouette moderately enlarged for the AP semi-erect technique, unchanged. Pulmonary vascularity normal without evidence of pulmonary edema currently. Large bilateral pleural effusions, left greater than right, with associated dense consolidation in the lower lobes. Fluid in the major fissure on the right. Upper lobes remain clear. Degenerative changes throughout the thoracic and visualized lumbar spine and in the right acromioclavicular joint.  IMPRESSION: Stable moderate cardiomegaly without evidence of pulmonary edema currently. Large bilateral pleural effusions, left greater than right, with associated dense passive atelectasis versus pneumonia in the lower lobes.   Electronically Signed   By: Hulan Saashomas  Lawrence M.D.   On: 11/19/2014 21:01     EKG Interpretation None        Date: @EDTODAY (<PARAMETER> error)@  Rate: 100  Rhythm: sinus tachycardia and premature  atrial contractions (PAC)  QRS Axis: indeterminate  Intervals: normal  ST/T Wave abnormalities: nonspecific ST/T changes and q waves  Conduction Disutrbances:none  Narrative Interpretation:   Old EKG Reviewed: unchanged    10:12 PM Pt tells me that he will not have blood drawn and would rather "go home and die" than deal with having a needle stuck in him.  Pt made aware that he has a very concerning chest xray and that his oxygen level is very low, even with oxygen on and he has no oxygen available to him at home.  Discussed pt with Dr Karma GanjaLinker who will also see the patient.    I spoke with the on call cardiologist who recommends medical admission and consult again when patient is diuresed and able to lie flat for possible cath.  Pt has refused  cath before.  I spoke with Dr Adela Glimpse about the patient and she will admit the patient.    MDM   Final diagnoses:  CHF exacerbation  NSTEMI (non-ST elevated myocardial infarction)    Pt with hx CHF, HTN, DM, stroke p/w two days SOB and productive cough.  No fevers, no leukocytosis, no other sick symptoms.  Doubt pneumonia.  CXR shows bilateral pleural effusions and cardiomegaly.  BNP elevated.  Troponin also elevated.  Pt given aspirin, lasix, nitro paste.  Discussed with cardiology and admitted to [facility].  Cardiologist has reviewed EKG and results.  Pt has refused cardiac cath in the past.  Discussion left to Hospitalist and Cardiologist to decide to transfer to [facility] vs admit at [facility] based on their discussed plan.  Admit for acute on chronic CHF exacerbation with possible NSTEMI.      Trixie Dredge, PA-C 11/20/14 0126  Ethelda Chick, MD 11/20/14 [id number]

## 2014-11-19 NOTE — ED Notes (Signed)
Pt. On cardiac monitor. 

## 2014-11-19 NOTE — ED Notes (Signed)
Pt with 2 day history of coughing and difficulty breathing. Pt is not home o2 depending however o2 via [place] initiated in route with EMS and "helped with his breathing". Pt states he has been coughing up flem but didn't notice the color of it.

## 2014-11-19 NOTE — ED Notes (Signed)
Pt has now agreed to lab work, IV placement and medication as well as is asking for a meal.

## 2014-11-19 NOTE — ED Notes (Signed)
Pt refusing treatment. States to  PA that he "wants to go home and die". Refusing for staff to "stick him with a needle."

## 2014-11-19 NOTE — ED Notes (Signed)
Bed: [id number] Expected date:  Expected time:  Means of arrival:  Comments: EMS/73 yo male short of breath/cough for 2 days/O2

## 2014-11-19 NOTE — ED Notes (Signed)
EKG given to EDP, Harrison,MD., for review. 

## 2014-11-20 ENCOUNTER — Inpatient Hospital Stay (HOSPITAL_COMMUNITY): Payer: Medicare (Managed Care)

## 2014-11-20 ENCOUNTER — Encounter (HOSPITAL_COMMUNITY): Payer: Self-pay | Admitting: Internal Medicine

## 2014-11-20 DIAGNOSIS — E119 Type 2 diabetes mellitus without complications: Secondary | ICD-10-CM | POA: Diagnosis present

## 2014-11-20 DIAGNOSIS — L97509 Non-pressure chronic ulcer of other part of unspecified foot with unspecified severity: Secondary | ICD-10-CM | POA: Diagnosis present

## 2014-11-20 DIAGNOSIS — I10 Essential (primary) hypertension: Secondary | ICD-10-CM | POA: Diagnosis present

## 2014-11-20 DIAGNOSIS — R0602 Shortness of breath: Secondary | ICD-10-CM | POA: Diagnosis present

## 2014-11-20 DIAGNOSIS — J90 Pleural effusion, not elsewhere classified: Secondary | ICD-10-CM | POA: Diagnosis present

## 2014-11-20 DIAGNOSIS — R0902 Hypoxemia: Secondary | ICD-10-CM | POA: Diagnosis not present

## 2014-11-20 DIAGNOSIS — F17210 Nicotine dependence, cigarettes, uncomplicated: Secondary | ICD-10-CM | POA: Diagnosis present

## 2014-11-20 DIAGNOSIS — R778 Other specified abnormalities of plasma proteins: Secondary | ICD-10-CM | POA: Insufficient documentation

## 2014-11-20 DIAGNOSIS — I214 Non-ST elevation (NSTEMI) myocardial infarction: Secondary | ICD-10-CM | POA: Diagnosis present

## 2014-11-20 DIAGNOSIS — L03119 Cellulitis of unspecified part of limb: Secondary | ICD-10-CM | POA: Diagnosis present

## 2014-11-20 DIAGNOSIS — Z6823 Body mass index (BMI) 23.0-23.9, adult: Secondary | ICD-10-CM | POA: Diagnosis not present

## 2014-11-20 DIAGNOSIS — D649 Anemia, unspecified: Secondary | ICD-10-CM | POA: Diagnosis present

## 2014-11-20 DIAGNOSIS — E039 Hypothyroidism, unspecified: Secondary | ICD-10-CM | POA: Diagnosis present

## 2014-11-20 DIAGNOSIS — J9601 Acute respiratory failure with hypoxia: Secondary | ICD-10-CM | POA: Diagnosis present

## 2014-11-20 DIAGNOSIS — I5043 Acute on chronic combined systolic (congestive) and diastolic (congestive) heart failure: Secondary | ICD-10-CM | POA: Insufficient documentation

## 2014-11-20 DIAGNOSIS — L97319 Non-pressure chronic ulcer of right ankle with unspecified severity: Secondary | ICD-10-CM | POA: Diagnosis present

## 2014-11-20 DIAGNOSIS — L97511 Non-pressure chronic ulcer of other part of right foot limited to breakdown of skin: Secondary | ICD-10-CM

## 2014-11-20 DIAGNOSIS — I429 Cardiomyopathy, unspecified: Secondary | ICD-10-CM | POA: Diagnosis present

## 2014-11-20 DIAGNOSIS — Z8673 Personal history of transient ischemic attack (TIA), and cerebral infarction without residual deficits: Secondary | ICD-10-CM | POA: Diagnosis not present

## 2014-11-20 DIAGNOSIS — E876 Hypokalemia: Secondary | ICD-10-CM | POA: Diagnosis present

## 2014-11-20 DIAGNOSIS — R7989 Other specified abnormal findings of blood chemistry: Secondary | ICD-10-CM | POA: Diagnosis not present

## 2014-11-20 DIAGNOSIS — Z7982 Long term (current) use of aspirin: Secondary | ICD-10-CM | POA: Diagnosis not present

## 2014-11-20 DIAGNOSIS — I2510 Atherosclerotic heart disease of native coronary artery without angina pectoris: Secondary | ICD-10-CM | POA: Diagnosis present

## 2014-11-20 DIAGNOSIS — I5023 Acute on chronic systolic (congestive) heart failure: Secondary | ICD-10-CM | POA: Diagnosis present

## 2014-11-20 DIAGNOSIS — N179 Acute kidney failure, unspecified: Secondary | ICD-10-CM | POA: Diagnosis present

## 2014-11-20 DIAGNOSIS — E43 Unspecified severe protein-calorie malnutrition: Secondary | ICD-10-CM | POA: Diagnosis present

## 2014-11-20 DIAGNOSIS — Z9119 Patient's noncompliance with other medical treatment and regimen: Secondary | ICD-10-CM | POA: Diagnosis present

## 2014-11-20 DIAGNOSIS — I5021 Acute systolic (congestive) heart failure: Secondary | ICD-10-CM | POA: Diagnosis not present

## 2014-11-20 LAB — COMPREHENSIVE METABOLIC PANEL
ALK PHOS: 154 U/L — AB (ref 39–117)
ALK PHOS: 164 U/L — AB (ref 39–117)
ALT: 21 U/L (ref 0–53)
ALT: 23 U/L (ref 0–53)
ANION GAP: 3 — AB (ref 5–15)
AST: 27 U/L (ref 0–37)
AST: 30 U/L (ref 0–37)
Albumin: 3.3 g/dL — ABNORMAL LOW (ref 3.5–5.2)
Albumin: 3.4 g/dL — ABNORMAL LOW (ref 3.5–5.2)
Anion gap: 5 (ref 5–15)
BILIRUBIN TOTAL: 0.8 mg/dL (ref 0.3–1.2)
BILIRUBIN TOTAL: 0.8 mg/dL (ref 0.3–1.2)
BUN: 14 mg/dL (ref 6–23)
BUN: 12 mg/dL (ref 6–23)
CALCIUM: 8.9 mg/dL (ref 8.4–10.5)
CHLORIDE: 105 meq/L (ref 96–112)
CHLORIDE: 101 meq/L (ref 96–112)
CO2: 28 mmol/L (ref 19–32)
CO2: 31 mmol/L (ref 19–32)
CREATININE: 1.08 mg/dL (ref 0.50–1.35)
Calcium: 8.4 mg/dL (ref 8.4–10.5)
Creatinine, Ser: 1.2 mg/dL (ref 0.50–1.35)
GFR calc Af Amer: 78 mL/min — ABNORMAL LOW (ref >90)
GFR calc Af Amer: 68 mL/min — ABNORMAL LOW (ref >90)
GFR calc non Af Amer: 67 mL/min — ABNORMAL LOW (ref >90)
GFR calc non Af Amer: 59 mL/min — ABNORMAL LOW (ref >90)
GLUCOSE: 127 mg/dL — AB (ref 70–99)
Glucose, Bld: 145 mg/dL — ABNORMAL HIGH (ref 70–99)
Potassium: 4.1 mmol/L (ref 3.5–5.1)
Potassium: 3.8 mmol/L (ref 3.5–5.1)
SODIUM: 136 mmol/L (ref 135–145)
Sodium: 137 mmol/L (ref 135–145)
Total Protein: 6.9 g/dL (ref 6.0–8.3)
Total Protein: 7.5 g/dL (ref 6.0–8.3)

## 2014-11-20 LAB — RAPID URINE DRUG SCREEN, HOSP PERFORMED
Amphetamines: NOT DETECTED
BENZODIAZEPINES: NOT DETECTED
Barbiturates: NOT DETECTED
Cocaine: NOT DETECTED
OPIATES: POSITIVE — AB
Tetrahydrocannabinol: NOT DETECTED

## 2014-11-20 LAB — CBC WITH DIFFERENTIAL/PLATELET
BASOS ABS: 0 10*3/uL (ref 0.0–0.1)
Basophils Relative: 0 % (ref 0–1)
EOS ABS: 0.1 10*3/uL (ref 0.0–0.7)
Eosinophils Relative: 1 % (ref 0–5)
HCT: 36.7 % — ABNORMAL LOW (ref 39.0–52.0)
HEMOGLOBIN: 12 g/dL — AB (ref 13.0–17.0)
Lymphocytes Relative: 26 % (ref 12–46)
Lymphs Abs: 1.5 10*3/uL (ref 0.7–4.0)
MCH: 30.5 pg (ref 26.0–34.0)
MCHC: 32.7 g/dL (ref 30.0–36.0)
MCV: 93.4 fL (ref 78.0–100.0)
MONO ABS: 0.9 10*3/uL (ref 0.1–1.0)
Monocytes Relative: 15 % — ABNORMAL HIGH (ref 3–12)
Neutro Abs: 3.4 10*3/uL (ref 1.7–7.7)
Neutrophils Relative %: 58 % (ref 43–77)
Platelets: 210 10*3/uL (ref 150–400)
RBC: 3.93 MIL/uL — ABNORMAL LOW (ref 4.22–5.81)
RDW: 14.8 % (ref 11.5–15.5)
WBC: 5.9 10*3/uL (ref 4.0–10.5)

## 2014-11-20 LAB — GLUCOSE, CAPILLARY
GLUCOSE-CAPILLARY: 106 mg/dL — AB (ref 70–99)
GLUCOSE-CAPILLARY: 164 mg/dL — AB (ref 70–99)
Glucose-Capillary: 109 mg/dL — ABNORMAL HIGH (ref 70–99)
Glucose-Capillary: 105 mg/dL — ABNORMAL HIGH (ref 70–99)
Glucose-Capillary: 123 mg/dL — ABNORMAL HIGH (ref 70–99)

## 2014-11-20 LAB — TROPONIN I
TROPONIN I: 1.17 ng/mL — AB (ref <0.031)
Troponin I: 1.35 ng/mL (ref <0.031)
Troponin I: 1.98 ng/mL (ref <0.031)
Troponin I: 1.37 ng/mL (ref <0.031)

## 2014-11-20 LAB — URINALYSIS, ROUTINE W REFLEX MICROSCOPIC
Bilirubin Urine: NEGATIVE
GLUCOSE, UA: NEGATIVE mg/dL
HGB URINE DIPSTICK: NEGATIVE
Ketones, ur: NEGATIVE mg/dL
LEUKOCYTES UA: NEGATIVE
Nitrite: NEGATIVE
Protein, ur: NEGATIVE mg/dL
Specific Gravity, Urine: 1.007 (ref 1.005–1.030)
Urobilinogen, UA: 0.2 mg/dL (ref 0.0–1.0)
pH: 5.5 (ref 5.0–8.0)

## 2014-11-20 LAB — TSH: TSH: 1.534 u[IU]/mL (ref 0.350–4.500)

## 2014-11-20 LAB — PREALBUMIN: Prealbumin: 10.2 mg/dL — ABNORMAL LOW (ref 17.0–34.0)

## 2014-11-20 LAB — CBG MONITORING, ED: Glucose-Capillary: 133 mg/dL — ABNORMAL HIGH (ref 70–99)

## 2014-11-20 LAB — HEPARIN LEVEL (UNFRACTIONATED)
HEPARIN UNFRACTIONATED: 0.23 [IU]/mL — AB (ref 0.30–0.70)
Heparin Unfractionated: <0.1 IU/mL — ABNORMAL LOW (ref 0.30–0.70)

## 2014-11-20 LAB — LIPID PANEL
Cholesterol: 167 mg/dL (ref 0–200)
HDL: 55 mg/dL (ref >39)
LDL CALC: 95 mg/dL (ref 0–99)
Total CHOL/HDL Ratio: 3 RATIO
Triglycerides: 85 mg/dL (ref <150)
VLDL: 17 mg/dL (ref 0–40)

## 2014-11-20 LAB — PROCALCITONIN: Procalcitonin: <0.1 ng/mL

## 2014-11-20 LAB — BRAIN NATRIURETIC PEPTIDE
B Natriuretic Peptide: 1565.5 pg/mL — ABNORMAL HIGH (ref 0.0–100.0)
B Natriuretic Peptide: 1124.7 pg/mL — ABNORMAL HIGH (ref 0.0–100.0)

## 2014-11-20 LAB — HEMOGLOBIN A1C
Hgb A1c MFr Bld: 7.5 % — ABNORMAL HIGH (ref <5.7)
MEAN PLASMA GLUCOSE: 169 mg/dL — AB (ref <117)

## 2014-11-20 MED ORDER — INSULIN ASPART 100 UNIT/ML ~~LOC~~ SOLN
0.0000 [IU] | SUBCUTANEOUS | Status: DC
  Administered 2014-11-20: 2 [IU] via SUBCUTANEOUS

## 2014-11-20 MED ORDER — ASPIRIN 81 MG PO CHEW
81.0000 mg | CHEWABLE_TABLET | Freq: Every day | ORAL | Status: DC
  Administered 2014-11-20 – 2014-11-23 (×4): 81 mg via ORAL
  Filled 2014-11-20 (×3): qty 1

## 2014-11-20 MED ORDER — INSULIN ASPART 100 UNIT/ML ~~LOC~~ SOLN
0.0000 [IU] | Freq: Three times a day (TID) | SUBCUTANEOUS | Status: DC
  Administered 2014-11-21: 5 [IU] via SUBCUTANEOUS
  Administered 2014-11-21: 3 [IU] via SUBCUTANEOUS

## 2014-11-20 MED ORDER — ACETAMINOPHEN 325 MG PO TABS
650.0000 mg | ORAL_TABLET | ORAL | Status: DC | PRN
  Filled 2014-11-20: qty 2

## 2014-11-20 MED ORDER — ASPIRIN 81 MG PO CHEW
324.0000 mg | CHEWABLE_TABLET | Freq: Once | ORAL | Status: AC
Start: 2014-11-20 — End: 2014-11-20
  Administered 2014-11-20: 324 mg via ORAL
  Filled 2014-11-20: qty 4

## 2014-11-20 MED ORDER — GABAPENTIN 300 MG PO CAPS
300.0000 mg | ORAL_CAPSULE | Freq: Three times a day (TID) | ORAL | Status: DC
  Administered 2014-11-20 – 2014-11-23 (×8): 300 mg via ORAL
  Filled 2014-11-20 (×12): qty 1

## 2014-11-20 MED ORDER — LISINOPRIL 5 MG PO TABS
5.0000 mg | ORAL_TABLET | Freq: Every day | ORAL | Status: DC
  Administered 2014-11-20 – 2014-11-23 (×4): 5 mg via ORAL
  Filled 2014-11-20 (×4): qty 1

## 2014-11-20 MED ORDER — INSULIN ASPART 100 UNIT/ML ~~LOC~~ SOLN: 0.0000 [IU] | Freq: Every day | SUBCUTANEOUS | Status: DC

## 2014-11-20 MED ORDER — ENSURE COMPLETE PO LIQD
237.0000 mL | Freq: Two times a day (BID) | ORAL | Status: DC
  Administered 2014-11-21: 237 mL via ORAL

## 2014-11-20 MED ORDER — HEPARIN BOLUS VIA INFUSION
3000.0000 [IU] | Freq: Once | INTRAVENOUS | Status: AC
  Administered 2014-11-20: 3000 [IU] via INTRAVENOUS
  Filled 2014-11-20: qty 3000

## 2014-11-20 MED ORDER — ONDANSETRON HCL 4 MG/2ML IJ SOLN: 4.0000 mg | Freq: Four times a day (QID) | INTRAMUSCULAR | Status: DC | PRN

## 2014-11-20 MED ORDER — COLLAGENASE 250 UNIT/GM EX OINT
TOPICAL_OINTMENT | Freq: Every day | CUTANEOUS | Status: DC
  Administered 2014-11-20 – 2014-11-23 (×4): via TOPICAL
  Filled 2014-11-20: qty 30

## 2014-11-20 MED ORDER — HEPARIN BOLUS VIA INFUSION
2000.0000 [IU] | Freq: Once | INTRAVENOUS | Status: AC
  Administered 2014-11-20: 2000 [IU] via INTRAVENOUS
  Filled 2014-11-20: qty 2000

## 2014-11-20 MED ORDER — DOXYCYCLINE HYCLATE 100 MG PO TABS
100.0000 mg | ORAL_TABLET | Freq: Two times a day (BID) | ORAL | Status: DC
  Administered 2014-11-20 – 2014-11-21 (×4): 100 mg via ORAL
  Filled 2014-11-20 (×5): qty 1

## 2014-11-20 MED ORDER — SODIUM CHLORIDE 0.9 % IV SOLN: 250.0000 mL | INTRAVENOUS | Status: DC | PRN

## 2014-11-20 MED ORDER — GUAIFENESIN ER 600 MG PO TB12
600.0000 mg | ORAL_TABLET | Freq: Two times a day (BID) | ORAL | Status: DC
  Administered 2014-11-20 – 2014-11-23 (×7): 600 mg via ORAL
  Filled 2014-11-20 (×8): qty 1

## 2014-11-20 MED ORDER — SODIUM CHLORIDE 0.9 % IJ SOLN: 3.0000 mL | INTRAMUSCULAR | Status: DC | PRN

## 2014-11-20 MED ORDER — FUROSEMIDE 10 MG/ML IJ SOLN
40.0000 mg | Freq: Two times a day (BID) | INTRAMUSCULAR | Status: DC
  Administered 2014-11-20 – 2014-11-21 (×4): 40 mg via INTRAVENOUS
  Filled 2014-11-20 (×6): qty 4

## 2014-11-20 MED ORDER — INSULIN ASPART 100 UNIT/ML ~~LOC~~ SOLN: 0.0000 [IU] | Freq: Three times a day (TID) | SUBCUTANEOUS | Status: DC

## 2014-11-20 MED ORDER — SODIUM CHLORIDE 0.9 % IJ SOLN
3.0000 mL | Freq: Two times a day (BID) | INTRAMUSCULAR | Status: DC
  Administered 2014-11-20 – 2014-11-23 (×6): 3 mL via INTRAVENOUS

## 2014-11-20 MED ORDER — FUROSEMIDE 10 MG/ML IJ SOLN
20.0000 mg | Freq: Once | INTRAMUSCULAR | Status: AC
  Administered 2014-11-20: 20 mg via INTRAVENOUS
  Filled 2014-11-20: qty 4

## 2014-11-20 MED ORDER — HEPARIN (PORCINE) IN NACL 100-0.45 UNIT/ML-% IJ SOLN
1250.0000 [IU]/h | INTRAMUSCULAR | Status: DC
  Administered 2014-11-20: 1100 [IU]/h via INTRAVENOUS
  Filled 2014-11-20 (×2): qty 250

## 2014-11-20 MED ORDER — NITROGLYCERIN 2 % TD OINT
1.0000 [in_us] | TOPICAL_OINTMENT | Freq: Once | TRANSDERMAL | Status: AC
Start: 2014-11-20 — End: 2014-11-20
  Administered 2014-11-20: 1 [in_us] via TOPICAL
  Filled 2014-11-20: qty 30

## 2014-11-20 MED ORDER — HEPARIN (PORCINE) IN NACL 100-0.45 UNIT/ML-% IJ SOLN
800.0000 [IU]/h | INTRAMUSCULAR | Status: DC
  Administered 2014-11-20: 800 [IU]/h via INTRAVENOUS
  Filled 2014-11-20: qty 250

## 2014-11-20 MED ORDER — FUROSEMIDE 10 MG/ML IJ SOLN
20.0000 mg | Freq: Once | INTRAMUSCULAR | Status: AC
  Administered 2014-11-20: 20 mg via INTRAVENOUS

## 2014-11-20 MED ORDER — CARVEDILOL 6.25 MG PO TABS
6.2500 mg | ORAL_TABLET | Freq: Two times a day (BID) | ORAL | Status: DC
  Administered 2014-11-20 – 2014-11-23 (×7): 6.25 mg via ORAL
  Filled 2014-11-20 (×9): qty 1

## 2014-11-20 NOTE — ED Notes (Signed)
Critical lab value discussed with Trixie DredgeEmily West, PA

## 2014-11-20 NOTE — Consult Note (Signed)
CARDIOLOGY INPATIENT CONSULTATION NOTE  Patient ID: Russell Hamilton MRN: [id number], DOB/AGE: [birth year]   Admit date: 11/19/2014   Primary Physician: No PCP Per Patient Primary Cardiologist: Tobias Alexander MD  Reason for Consult:   Elevated troponin  Requesting Physician: [facility]  HPI:  This is a 74 y.o. 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 Patient's cardiac history starts from 08/2014 when he was admitted with dry cough, SOB to the hospital and was found to have cardiomyopathy. At that time CTA was negative for PE. His initial echocardiogram showed PAP of 50, LVEF of 40%, mild MR, mild LA dilatation, trivial pericardial effusion. His CXR at that time showed bilateral pleural effusion, which was thought to be due to long standing congestive heart failure. Unfortunately, he refused to undergo invasive evaluation with cardiac catheterization at that time. He also deneid any signs compatible with hemochromatosis, amyloidosis, hypothyroidism at that time. He then followed up with Dr. Delton See as outpatient at the end of October 2015 when he was feeling fine. His leg edema and DOE had resolved at that time. He did feel weak at that time attributable to hypotension and lasix. Lasix was held at that time for a week. He was maintained on low dose lisinopril due to hypotension and a low dose of BB was added. Patient was also noted to have protein calorie malnutrition.  Today he presented with increased DOE, leg edema, paroxysmal nocturnal dyspnea however he denied orthopnea. He was found to be hypoxic on presentation with pulse ox in 80s. He was also found to have increased pleural effusions than before. A troponin was drawn which was elevated. Patient denied chest pain, syncope, presyncope,  palpitations otherwise.    Problem List: Past Medical History  Diagnosis Date  . Stroke 2010    "memory problems since"  . Hypertension   . Type II diabetes mellitus   . Chronic systolic CHF (congestive heart failure)     a. 2D ECHO 08/13/14: EF 40%, diffuse hypokinesis possibly worse in the inferior wall. Mild MR, mild LA dilation, PA pressure 50. Trivial pericardial effusion.  . Acute systolic CHF (congestive heart failure), NYHA class 4 08/12/2014    Past Surgical History  Procedure Laterality Date  . Carotid endarterectomy Right 2010     Allergies: No Known Allergies   Home Medications No current facility-administered medications for this encounter.   Current Outpatient Prescriptions  Medication Sig Dispense Refill  . aspirin 81 MG chewable tablet Chew 1 tablet (81 mg total) by mouth daily. (Patient not taking: Reported on 10/27/2014) 30 tablet 3  . carvedilol (COREG) 6.25 MG tablet Take 1 tablet (6.25 mg total) by mouth 2 (two) times daily with a meal. (Patient not taking: Reported on 10/27/2014) 60 tablet 3  . furosemide (LASIX) 20 MG tablet Take 1 tablet (20 mg total) by mouth daily. (Patient not taking: Reported on 10/27/2014) 90 tablet 3  . gabapentin (NEURONTIN) 300 MG capsule Take 1 capsule (300 mg total) by mouth 3 (three) times daily. 90 capsule 3  . guaiFENesin (MUCINEX) 600 MG 12 hr tablet Take 1 tablet (600 mg total) by mouth 2 (two) times daily. 16 tablet 0  . lisinopril (PRINIVIL,ZESTRIL) 5 MG tablet Take 1 tablet (5 mg total) by mouth daily. (Patient not taking: Reported on 10/27/2014) 90 tablet 3  .  metFORMIN (GLUCOPHAGE) 500 MG tablet Take 1 tablet (500 mg total) by mouth 2 (two) times daily with a meal. (Patient not taking: Reported on 10/27/2014) 60 tablet 2  . simethicone (MYLICON) 80 MG chewable tablet Chew 1 tablet (80 mg total) by mouth 4 (four) times daily. (Patient not taking: Reported on 10/27/2014) 60 tablet 0     Family History  Problem Relation  Age of Onset  . Kidney disease Mother      History   Social History  . Marital Status: Single    Spouse Name: N/A    Number of Children: N/A  . Years of Education: N/A   Occupational History  . Not on file.   Social History Main Topics  . Smoking status: Current Every Day Smoker -- 0.50 packs/day for 56 years    Types: Cigarettes  . Smokeless tobacco: Never Used  . Alcohol Use: No     Comment: "quit drinking in 1976  . Drug Use: No  . Sexual Activity: Not Currently   Other Topics Concern  . Not on file   Social History Narrative     Review of Systems: General: fatigue increase weight negative for chills, fever, night sweats  Cardiovascular: leg edema, dyspnea but no chest pain  Dermatological: negative for rash Respiratory: productive cough negative for wheezing Urologic: negative for hematuria Abdominal: negative for nausea, vomiting, diarrhea, bright red blood per rectum, melena, or hematemesis Neurologic: negative for visual changes, syncope, or dizziness Hematology: mild anemia Psychiatry: non suicidal/homicidal  Musculoskeletal: back pain   Physical Exam: Vitals: BP 145/96 mmHg  Pulse 96  Temp(Src) 97.6 F (36.4 C) (Oral)  Resp 17  Ht  (1.753 m)  Wt 68.04 kg (150 lb)  BMI 22.14 kg/m2  SpO2 96% General: not in acute distress Neck: JVP flat, neck supple Heart: regular rate and rhythm, S1, S2, no murmurs  Lungs: decreased air entry bilaterally, dull percussion bilaterally up to the mid of the chest  GI: non tender, non distended, bowel sounds present Extremities: 1+  edema Neuro: AAO x 3  Psych: normal affect, no anxiety   Labs:   Results for orders placed or performed during the hospital encounter of 11/19/14 (from the past 24 hour(s))  Comprehensive metabolic panel     Status: Abnormal   Collection Time: 11/19/14 11:03 PM  Result Value Ref Range   Sodium 136 135 - 145 mmol/L   Potassium 4.1 3.5 - 5.1 mmol/L   Chloride 105 96 - 112 mEq/L     CO2 28 19 - 32 mmol/L   Glucose, Bld 145 (H) 70 - 99 mg/dL   BUN 14 6 - 23 mg/dL   Creatinine, Ser 1.61 0.50 - 1.35 mg/dL   Calcium 8.4 8.4 - 09.6 mg/dL   Total Protein 6.9 6.0 - 8.3 g/dL   Albumin 3.3 (L) 3.5 - 5.2 g/dL   AST 27 0 - 37 U/L   ALT 21 0 - 53 U/L   Alkaline Phosphatase 154 (H) 39 - 117 U/L   Total Bilirubin 0.8 0.3 - 1.2 mg/dL   GFR calc non Af Amer 67 (L) >90 mL/min   GFR calc Af Amer 78 (L) >90 mL/min   Anion gap 3 (L) 5 - 15  CBC with Differential     Status: Abnormal   Collection Time: 11/19/14 11:03 PM  Result Value Ref Range   WBC 5.9 4.0 - 10.5 K/uL   RBC 3.77 (L) 4.22 - 5.81 MIL/uL   Hemoglobin 11.4 (  L) 13.0 - 17.0 g/dL   HCT 09.835.3 (L) 11.939.0 - 14.752.0 %   MCV 93.6 78.0 - 100.0 fL   MCH 30.2 26.0 - 34.0 pg   MCHC 32.3 30.0 - 36.0 g/dL   RDW 82.914.9 56.211.5 - 13.015.5 %   Platelets 188 150 - 400 K/uL   Neutrophils Relative % 66 43 - 77 %   Neutro Abs 3.9 1.7 - 7.7 K/uL   Lymphocytes Relative 22 12 - 46 %   Lymphs Abs 1.3 0.7 - 4.0 K/uL   Monocytes Relative 11 3 - 12 %   Monocytes Absolute 0.7 0.1 - 1.0 K/uL   Eosinophils Relative 1 0 - 5 %   Eosinophils Absolute 0.1 0.0 - 0.7 K/uL   Basophils Relative 0 0 - 1 %   Basophils Absolute 0.0 0.0 - 0.1 K/uL  Brain natriuretic peptide     Status: Abnormal   Collection Time: 11/19/14 11:03 PM  Result Value Ref Range   B Natriuretic Peptide 1565.5 (H) 0.0 - 100.0 pg/mL  Troponin I     Status: Abnormal   Collection Time: 11/19/14 11:03 PM  Result Value Ref Range   Troponin I 1.17 (HH) <0.031 ng/mL     Radiology/Studies: Dg Chest 2 View  11/19/2014   CLINICAL DATA:  Two day history of productive cough and shortness of breath. Current history of hypertension. Prior episodes of CHF.  EXAM: CHEST  2 VIEW  COMPARISON:  Two-view chest x-ray and CTA chest 08/12/2014.  FINDINGS: Cardiac silhouette moderately enlarged for the AP semi-erect technique, unchanged. Pulmonary vascularity normal without evidence of pulmonary edema  currently. Large bilateral pleural effusions, left greater than right, with associated dense consolidation in the lower lobes. Fluid in the major fissure on the right. Upper lobes remain clear. Degenerative changes throughout the thoracic and visualized lumbar spine and in the right acromioclavicular joint.  IMPRESSION: Stable moderate cardiomegaly without evidence of pulmonary edema currently. Large bilateral pleural effusions, left greater than right, with associated dense passive atelectasis versus pneumonia in the lower lobes.   Electronically Signed   By: Hulan Saashomas  Lawrence M.D.   On: 11/19/2014 21:01   Dg Foot 2 Views Right  11/20/2014   CLINICAL DATA:  Foot ulcer over medial malleolus.  EXAM: RIGHT FOOT - 2 VIEW  COMPARISON:  None.  FINDINGS: Skin thickening and probable ulcer about the medial ankle. There is a questionable adjacent periosteal reaction of the medial malleolus. This would be better assessed on dedicated ankle series. There is mild hallux valgus and degenerative change in the first metatarsal phalangeal joint. No acute fracture. Moderate plantar calcaneal spur and small Achilles tendon enthesophyte. There is an os navicular. No findings in the foot to suggest osteomyelitis.  IMPRESSION: Soft tissue ulcer about the medial ankle. There is questionable subjacent periosteal reaction about the medial malleolus. This could be better assessed on dedicated three-view ankle radiographs. There is no evidence of osteomyelitis of foot.  Mild hallux valgus and degenerative change of the first metatarsal phalangeal joint.   Electronically Signed   By: Rubye OaksMelanie  Ehinger M.D.   On: 11/20/2014 02:20    EKG: 11/19/2014: sinus tachycardia, LVH by voltage criteria, LV strain pattern in the lateral leads similar to one observed in 08/13/2014 EKG  Echo: 08/2014 mild LVH, mild hypokinesis without focal WMA, LVEF 40%, mod calcification of aortic valves, mild MR, PASP = 50 mmhg, normal RV, trivial pericardial  effusion  Cardiac cath: none  Medical decision making:  Discussed care with  the patient Discussed care with the physician on the phone Reviewed labs and imaging personally Reviewed prior records  ASSESSMENT AND PLAN:  This is a 74 y.o. 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 ED.   Active Problems:   Acute systolic CHF (congestive heart failure), NYHA class 4 Hemodynamic congestion as evidenced by the elevated BNP  Lasix dose was reduced recently so there is possiblity that this may be due to decreased diuresis CXR demonstrated elevated levels of pleural effusion which if not resolved may require thoracentesis Lasix IV 40 mg BID x 2 days with goal diuresis of >2 L will suffice symptomatic treatment Continue lisinopril and coreg Repeat echocardiogram to evaluate for LVEF/wall motion abnormality Cycle troponin Continue aspirin Check lipid profile, I think the high dose statin use is justified in this patient with history of diabetes and calcified CAD on CT chest Once patient is euvolemic, a discussion about ischemic evaluation could be repeated with him as he wants to weigh in risks and benefits of invasive evaluation for ischemia NPO for now until morning physician discusses care with the patient Strict I/Os, check daily weights, fluid restriction 1500 ml/day, cardiac/carb restricted diet  Bilateral pleural effusions, likely secondary to heart failure Increased in severity Due to symptomatic hypoxia, may need drainage if not resorbed.     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   Mild anemia Likely nutritional, replace iron if iron studies are abnormal    Signed, Joellyn Rued, MD MS 11/20/2014, 2:26 AM

## 2014-11-20 NOTE — Progress Notes (Signed)
CRITICAL VALUE ALERT  Critical value received:  Troponin 1.35  Date of notification:  11/20/14  Time of notification:  0630  Critical value read back:Yes.    Nurse who received alert:  Clydie BraunKaren, RN  MD notified (1st page):  Lendell CapriceSullivan   Time of first page:  0730  MD notified (2nd page): On coming RN made aware  Time of second page:  Responding MD:    Time MD responded:

## 2014-11-20 NOTE — Progress Notes (Signed)
INITIAL NUTRITION ASSESSMENT  DOCUMENTATION CODES Per approved criteria  -Severe malnutrition in the context of chronic illness   INTERVENTION: Ensure Complete po BID, each supplement provides 350 kcal and 13 grams of protein  NUTRITION DIAGNOSIS: Malnutrition related to chronic illness as evidenced by severe fat and muscle depletion.   Goal: Pt to meet >/= 90% of their estimated nutrition needs   Monitor:  Diet advancement, PO intake, weight trends, labs  Reason for Assessment: MD consult  74 y.o. male  Admitting Dx: SOB  ASSESSMENT: Pt with hx of class IV CHF admitted with worsening SOB, bilateral pleural effusions and elevated troponin (cardiology following). Pt with a foot ulcer, WOC consulted.    Pt provides limited hx, did not want to talk. Reports 60 lb weight loss x 2 years but not specific. Lives alone and does cooking. Pt not specific about what he eats.  Pt likes ensure but is not specific about if he drinks them at home or not.   Nutrition Focused Physical Exam:  Subcutaneous Fat:  Orbital Region: WDL Upper Arm Region: severe depletion Thoracic and Lumbar Region: severe depletion  Muscle:  Temple Region: mild/moderate depletion Clavicle Bone Region: severe depletion Clavicle and Acromion Bone Region: severe depletion Scapular Bone Region: unable to assess Dorsal Hand: unable to assess Patellar Region: unable to assess Anterior Thigh Region: unable to assess  Posterior Calf Region: unable to assess   Edema: present    Height: Ht Readings from Last 1 Encounters:  11/19/14  (1.753 m)    Weight: Wt Readings from Last 1 Encounters:  11/20/14 157 lb 1.6 oz (71.26 kg)    Ideal Body Weight: 72.7 kg   % Ideal Body Weight: 98%  Wt Readings from Last 10 Encounters:  11/20/14 157 lb 1.6 oz (71.26 kg)  10/27/14 151 lb 12.8 oz (68.856 kg)  09/01/14 143 lb (64.864 kg)  08/14/14 143 lb 0.9 oz (64.89 kg)    Usual Body Weight: 185 lb   % Usual  Body Weight: 85%  BMI:  Body mass index is 23.19 kg/(m^2).  Estimated Nutritional Needs: Kcal: 2000-2200 Protein: 100-130 grams Fluid: >1.5 L/day  Skin: pressure ulcer ankle   Diet Order: Diet heart healthy/carb modified  EDUCATION NEEDS: -No education needs identified at this time   Intake/Output Summary (Last 24 hours) at 11/20/14 1132 Last data filed at 11/20/14 1011  Gross per 24 hour  Intake    270 ml  Output   1900 ml  Net  -1630 ml    Last BM: PTA   Labs:   Recent Labs Lab 11/19/14 2303 11/20/14 0420  NA 136 137  K 4.1 3.8  CL 105 101  CO2 28 31  BUN 14 12  CREATININE 1.08 1.20  CALCIUM 8.4 8.9  GLUCOSE 145* 127*    CBG (last 3)   Recent Labs  11/20/14 0243 11/20/14 0351 11/20/14 0806  GLUCAP 133* 109* 105*    Scheduled Meds: . aspirin  81 mg Oral Daily  . carvedilol  6.25 mg Oral BID WC  . doxycycline  100 mg Oral Q12H  . furosemide  40 mg Intravenous BID  . gabapentin  300 mg Oral TID  . guaiFENesin  600 mg Oral BID  . heparin  2,000 Units Intravenous Once  . insulin aspart  0-9 Units Subcutaneous 6 times per day  . lisinopril  5 mg Oral Daily  . sodium chloride  3 mL Intravenous Q12H    Continuous Infusions: . heparin  Past Medical History  Diagnosis Date  . Stroke 2010    "memory problems since"  . Hypertension   . Type II diabetes mellitus   . Chronic systolic CHF (congestive heart failure)     a. 2D ECHO 08/13/14: EF 40%, diffuse hypokinesis possibly worse in the inferior wall. Mild MR, mild LA dilation, PA pressure 50. Trivial pericardial effusion.  . Acute systolic CHF (congestive heart failure), NYHA class 4 08/12/2014    Past Surgical History  Procedure Laterality Date  . Carotid endarterectomy Right [address]2010    Akiera Allbaugh RD, LDN, CNSC [phone] Pager [phone] After Hours Pager

## 2014-11-20 NOTE — Progress Notes (Signed)
ANTICOAGULATION CONSULT NOTE - Initial Consult  Pharmacy Consult for heparin Indication: chest pain/ACS  No Known Allergies  Patient Measurements: Height: 5\' 9"  (175.3 cm) Weight: 150 lb (68.04 kg) IBW/kg (Calculated) : 70.7  Vital Signs: Temp: 97.6 F (36.4 C) (01/16 0132) Temp Source: Oral (01/16 0132) BP: 145/96 mmHg (01/16 0206) Pulse Rate: 96 (01/16 0206)  Labs:  Recent Labs  11/19/14 2303  HGB 11.4*  HCT 35.3*  PLT 188  CREATININE 1.08  TROPONINI 1.17*    Estimated Creatinine Clearance: 60.3 mL/min (by C-G formula based on Cr of 1.08).   Medical History: Past Medical History  Diagnosis Date  . Stroke 2010    "memory problems since"  . Hypertension   . Type II diabetes mellitus   . Chronic systolic CHF (congestive heart failure)     a. 2D ECHO 08/13/14: EF 40%, diffuse hypokinesis possibly worse in the inferior wall. Mild MR, mild LA dilation, PA pressure 50. Trivial pericardial effusion.  . Acute systolic CHF (congestive heart failure), NYHA class 4 08/12/2014    Medications:  Prescriptions prior to admission  Medication Sig Dispense Refill Last Dose  . aspirin 81 MG chewable tablet Chew 1 tablet (81 mg total) by mouth daily. (Patient not taking: Reported on 10/27/2014) 30 tablet 3 Not Taking  . carvedilol (COREG) 6.25 MG tablet Take 1 tablet (6.25 mg total) by mouth 2 (two) times daily with a meal. (Patient not taking: Reported on 10/27/2014) 60 tablet 3 Not Taking  . furosemide (LASIX) 20 MG tablet Take 1 tablet (20 mg total) by mouth daily. (Patient not taking: Reported on 10/27/2014) 90 tablet 3 Not Taking  . gabapentin (NEURONTIN) 300 MG capsule Take 1 capsule (300 mg total) by mouth 3 (three) times daily. 90 capsule 3 Taking  . guaiFENesin (MUCINEX) 600 MG 12 hr tablet Take 1 tablet (600 mg total) by mouth 2 (two) times daily. 16 tablet 0 Taking  . lisinopril (PRINIVIL,ZESTRIL) 5 MG tablet Take 1 tablet (5 mg total) by mouth daily. (Patient not taking:  Reported on 10/27/2014) 90 tablet 3 Not Taking  . metFORMIN (GLUCOPHAGE) 500 MG tablet Take 1 tablet (500 mg total) by mouth 2 (two) times daily with a meal. (Patient not taking: Reported on 10/27/2014) 60 tablet 2 Not Taking  . simethicone (MYLICON) 80 MG chewable tablet Chew 1 tablet (80 mg total) by mouth 4 (four) times daily. (Patient not taking: Reported on 10/27/2014) 60 tablet 0 Not Taking   Scheduled:  . aspirin  81 mg Oral Daily  . carvedilol  6.25 mg Oral BID WC  . doxycycline  100 mg Oral Q12H  . furosemide  40 mg Intravenous BID  . gabapentin  300 mg Oral TID  . guaiFENesin  600 mg Oral BID  . insulin aspart  0-9 Units Subcutaneous 6 times per day  . lisinopril  5 mg Oral Daily  . sodium chloride  3 mL Intravenous Q12H    Assessment: 74yo male c/o worsening SOB, admitted for acute CHF, found to have elevated troponin, to begin heparin given high risk of ACS.  Goal of Therapy:  Heparin level 0.3-0.7 units/ml Monitor platelets by anticoagulation protocol: Yes   Plan:  Will give heparin 3000 units x1 followed by gtt at 800 units/hr and monitor heparin levels and CBC.  Vernard GamblesVeronda Estelle Greenleaf, PharmD, BCPS  11/20/2014,3:36 AM

## 2014-11-20 NOTE — H&P (Signed)
PCP:  None    Chief Complaint:  Shortness of breath  HPI: Russell Hamilton is a 74 y.o. male   has a past medical history of Stroke (2010); Hypertension; Type II diabetes mellitus; Chronic systolic CHF (congestive heart failure); and Acute systolic CHF (congestive heart failure), NYHA class 4 (08/12/2014).   Presented with  Patient reports 2 day hx of cough and shortness of breath no fever. Non-productive cough. He reports taking his medications. Reports increased leg swelling. He was found to be hypoxic on RA down to 85%. Per Echo 10/15   EF 40%, diffuse hypokinesis possibly worse in the inferior wall. Mild MR, mild LA dilation, PA pressure 50. Patiet in the past has refused cardiac work up but curretnly is more agreeable stating if he has to do it he will. Cardiology consult has been called spoken to Dr. Gae GallopQuereshi who [facility].   Hospitalist was called for admission for CHF exacerbation and elevated troponin  Review of Systems:    Pertinent positives include: shortness of breath at rest. dyspnea on exertion, non-productive cough,   Constitutional:  No weight loss, night sweats, Fevers, chills, fatigue, weight loss  HEENT:  No headaches, Difficulty swallowing,Tooth/dental problems,Sore throat,  No sneezing, itching, ear ache, nasal congestion, post nasal drip,  Cardio-vascular:  No chest pain, Orthopnea, PND, anasarca, dizziness, palpitations.no Bilateral lower extremity swelling  GI:  No heartburn, indigestion, abdominal pain, nausea, vomiting, diarrhea, change in bowel habits, loss of appetite, melena, blood in stool, hematemesis Resp:   No excess mucus, no productive cough, NoNo coughing up of blood.No change in color of mucus.No wheezing. Skin:  no rash or lesions. No jaundice GU:  no dysuria, change in color of urine, no urgency or frequency. No straining to urinate.  No flank pain.  Musculoskeletal:  No joint pain or no joint  swelling. No decreased range of motion. No back pain.  Psych:  No change in mood or affect. No depression or anxiety. No memory loss.  Neuro: no localizing neurological complaints, no tingling, no weakness, no double vision, no gait abnormality, no slurred speech, no confusion  Otherwise ROS are negative except for above, 10 systems were reviewed  Past Medical History: Past Medical History  Diagnosis Date  . Stroke 2010    "memory problems since"  . Hypertension   . Type II diabetes mellitus   . Chronic systolic CHF (congestive heart failure)     a. 2D ECHO 08/13/14: EF 40%, diffuse hypokinesis possibly worse in the inferior wall. Mild MR, mild LA dilation, PA pressure 50. Trivial pericardial effusion.  . Acute systolic CHF (congestive heart failure), NYHA class 4 08/12/2014   Past Surgical History  Procedure Laterality Date  . Carotid endarterectomy Right 2010     Medications: Prior to Admission medications   Medication Sig Start Date End Date Taking? Authorizing Provider  aspirin 81 MG chewable tablet Chew 1 tablet (81 mg total) by mouth daily. Patient not taking: Reported on 10/27/2014 08/14/14   Richarda OverlieNayana Abrol, MD  carvedilol (COREG) 6.25 MG tablet Take 1 tablet (6.25 mg total) by mouth 2 (two) times daily with a meal. Patient not taking: Reported on 10/27/2014 08/14/14   Richarda OverlieNayana Abrol, MD  furosemide (LASIX) 20 MG tablet Take 1 tablet (20 mg total) by mouth daily. Patient not taking: Reported on 10/27/2014 09/01/14   Lars MassonKatarina H Nelson, MD  gabapentin (NEURONTIN) 300 MG capsule Take 1 capsule (300 mg total) by mouth 3 (three)  times daily. 08/14/14   Richarda Overlie, MD  guaiFENesin (MUCINEX) 600 MG 12 hr tablet Take 1 tablet (600 mg total) by mouth 2 (two) times daily. 08/14/14   Richarda Overlie, MD  lisinopril (PRINIVIL,ZESTRIL) 5 MG tablet Take 1 tablet (5 mg total) by mouth daily. Patient not taking: Reported on 10/27/2014 09/01/14   Lars Masson, MD  metFORMIN (GLUCOPHAGE) 500  MG tablet Take 1 tablet (500 mg total) by mouth 2 (two) times daily with a meal. Patient not taking: Reported on 10/27/2014 08/14/14   Richarda Overlie, MD  simethicone (MYLICON) 80 MG chewable tablet Chew 1 tablet (80 mg total) by mouth 4 (four) times daily. Patient not taking: Reported on 10/27/2014 08/14/14   Richarda Overlie, MD    Allergies:  No Known Allergies  Social History:  Ambulatory  independently   Lives at home alone,       reports that he has been smoking Cigarettes.  He has a 28 pack-year smoking history. He has never used smokeless tobacco. He reports that he does not drink alcohol or use illicit drugs.    Family History: family history includes Kidney disease in his mother.    Physical Exam: Patient Vitals for the past 24 hrs:  BP Temp Temp src Pulse Resp SpO2 Height Weight  11/19/14 2344 144/65 mmHg 97.9 F (36.6 C) Oral 86 23 100 % - -  11/19/14 2023 - - - - (!) 27 95 % - -  11/19/14 2013 - - - - - 90 % - -  11/19/14 2011 151/96 mmHg 98 F (36.7 C) Oral 103 - (!) 85 % 5\' 9"  (1.753 m) 68.04 kg (150 lb)  11/19/14 2008 - - - - - 95 % - -    1. General:  in No Acute distress 2. Psychological: Alert and  Oriented 3. Head/ENT:   Moist Mucous Membranes                          Head Non traumatic, neck supple                           Poor Dentition 4. SKIN: normal  Skin turgor,  Skin clean Dry on right foot black eschar presetn over median malleolus with small amount of purulent discharge  5. Heart: Regular rate and rhythm no Murmur, Rub or gallop 6. Lungs:  no wheezes some crackles, diminished air sounds at the bases bilateraly 7. Abdomen: Soft, non-tender, Non distended 8. Lower extremities: no clubbing, cyanosis, trace edema,  9. Neurologically Grossly intact, moving all 4 extremities equally 10. MSK: Normal range of motion  body mass index is 22.14 kg/(m^2).   Labs on Admission:   Results for orders placed or performed during the hospital encounter of  11/19/14 (from the past 24 hour(s))  Comprehensive metabolic panel     Status: Abnormal   Collection Time: 11/19/14 11:03 PM  Result Value Ref Range   Sodium 136 135 - 145 mmol/L   Potassium 4.1 3.5 - 5.1 mmol/L   Chloride 105 96 - 112 mEq/L   CO2 28 19 - 32 mmol/L   Glucose, Bld 145 (H) 70 - 99 mg/dL   BUN 14 6 - 23 mg/dL   Creatinine, Ser 1.61 0.50 - 1.35 mg/dL   Calcium 8.4 8.4 - 09.6 mg/dL   Total Protein 6.9 6.0 - 8.3 g/dL   Albumin 3.3 (L) 3.5 - 5.2 g/dL  AST 27 0 - 37 U/L   ALT 21 0 - 53 U/L   Alkaline Phosphatase 154 (H) 39 - 117 U/L   Total Bilirubin 0.8 0.3 - 1.2 mg/dL   GFR calc non Af Amer 67 (L) >90 mL/min   GFR calc Af Amer 78 (L) >90 mL/min   Anion gap 3 (L) 5 - 15  CBC with Differential     Status: Abnormal   Collection Time: 11/19/14 11:03 PM  Result Value Ref Range   WBC 5.9 4.0 - 10.5 K/uL   RBC 3.77 (L) 4.22 - 5.81 MIL/uL   Hemoglobin 11.4 (L) 13.0 - 17.0 g/dL   HCT 16.1 (L) 09.6 - 04.5 %   MCV 93.6 78.0 - 100.0 fL   MCH 30.2 26.0 - 34.0 pg   MCHC 32.3 30.0 - 36.0 g/dL   RDW 40.9 81.1 - 91.4 %   Platelets 188 150 - 400 K/uL   Neutrophils Relative % 66 43 - 77 %   Neutro Abs 3.9 1.7 - 7.7 K/uL   Lymphocytes Relative 22 12 - 46 %   Lymphs Abs 1.3 0.7 - 4.0 K/uL   Monocytes Relative 11 3 - 12 %   Monocytes Absolute 0.7 0.1 - 1.0 K/uL   Eosinophils Relative 1 0 - 5 %   Eosinophils Absolute 0.1 0.0 - 0.7 K/uL   Basophils Relative 0 0 - 1 %   Basophils Absolute 0.0 0.0 - 0.1 K/uL  Brain natriuretic peptide     Status: Abnormal   Collection Time: 11/19/14 11:03 PM  Result Value Ref Range   B Natriuretic Peptide 1565.5 (H) 0.0 - 100.0 pg/mL  Troponin I     Status: Abnormal   Collection Time: 11/19/14 11:03 PM  Result Value Ref Range   Troponin I 1.17 (HH) <0.031 ng/mL    UA not obtained will order  Lab Results  Component Value Date   HGBA1C 6.9* 08/13/2014    Estimated Creatinine Clearance: 60.3 mL/min (by C-G formula based on Cr of  1.08).  BNP (last 3 results)  Recent Labs  08/12/14 1756  PROBNP 7427.0*    Other results:  I have pearsonaly reviewed this: ECG REPORT  Rate: 100  Rhythm: ST ST&T Change: Evidence of LVH   Filed Weights   11/19/14 2011  Weight: 68.04 kg (150 lb)     Cultures: No results found for: SDES, SPECREQUEST, CULT, REPTSTATUS   Radiological Exams on Admission: Dg Chest 2 View  11/19/2014   CLINICAL DATA:  Two day history of productive cough and shortness of breath. Current history of hypertension. Prior episodes of CHF.  EXAM: CHEST  2 VIEW  COMPARISON:  Two-view chest x-ray and CTA chest 08/12/2014.  FINDINGS: Cardiac silhouette moderately enlarged for the AP semi-erect technique, unchanged. Pulmonary vascularity normal without evidence of pulmonary edema currently. Large bilateral pleural effusions, left greater than right, with associated dense consolidation in the lower lobes. Fluid in the major fissure on the right. Upper lobes remain clear. Degenerative changes throughout the thoracic and visualized lumbar spine and in the right acromioclavicular joint.  IMPRESSION: Stable moderate cardiomegaly without evidence of pulmonary edema currently. Large bilateral pleural effusions, left greater than right, with associated dense passive atelectasis versus pneumonia in the lower lobes.   Electronically Signed   By: Hulan Saas M.D.   On: 11/19/2014 21:01    Chart has been reviewed  Assessment/Plan  74 yo M with hx of HTN, DM systolic CHF and tobacco abuse here  with worsening shortness of breath, bilateral pleural effsuions and elevated troponin cardiology is aware  Present on Admission:  . Acute systolic CHF (congestive heart failure), NYHA class 4 - given elevated troponin and need for  CAD work up in the future will transfer to [facility]. IV lasix, continue ACEi . HTN (hypertension)- cont coreg and lisinopril DM2 - SSI  . Hypoxia in the setting of bilateral effusions. No WBC no fever  or productive cough to sugges PNA, will repeat CXR once have been diuresed . Elevated troponin - cardiology has been consulted, patient at high risk for CAD, no chest pain, ECG non-specific will need definitive cardiac work up. Will heaparnize for now. Transfer to [facility]. Patient at this point states" he will do procedures if absolutely needed" . Foot ulcer- patient has been hitting his feet against each other. Will cover with doxycycline. Order wound consult.     Prophylaxis: heparin   CODE STATUS:  FULL CODE   Other plan as per orders.  I have spent a total of 65 min on this admission extra time was spent on discussing care with cardiology.   Raul Torrance 11/20/2014, 1:00 AM  [facility]  Pager [phone]   after 2 AM please page floor coverage PA If 7AM-7PM, please contact the day team taking care of the patient  [email or web address]  Password [email or web address]

## 2014-11-20 NOTE — Progress Notes (Signed)
Pt. Alert and stable. No s/s of distress or discomfort noted. Pt. Denies pain at this time. Pt. Oriented to room, placed on telemetry monitor. CCMD notified. VSS. RN will continue to monitor pt. For changes in condition. Russell Hamilton, Russell Hamilton

## 2014-11-20 NOTE — ED Notes (Signed)
[facility]

## 2014-11-20 NOTE — Progress Notes (Signed)
Admitted after midnight. Chart reviewed. Patient examined. Currently eating a bag of barbecue Fritos.  Denies chest pain. Breathing improved.  Troponin at 9 AM on 0.98. Continue heparin drip, aspirin, diuretics, beta blocker.  Cardiology following.  Russell Curborinna Kamla Skilton, MD [facility] [email or web address]

## 2014-11-20 NOTE — Consult Note (Signed)
WOC wound consult note Reason for Consult:Bilateral medial malleolar ulcerations with eschar.  Bedside RN states that patient "bangs ankles together". Patient is watching television and indicated that I might examine him briefly. Wound type:Differential diagnosis includes trauma vs pressure vs venous insufficiency. Pressure Ulcer POA: Yes Measurement: Left medial malleolus:  Dried eschar measuring 1.5cm round.  Right medial malleolus with darkened, hard area measuring 3cm x 3cm with eschar in center measuring 2cm round.  I am unable to determine depth on either area due to the presence of non-viable tissue. Wound bed:As described above. Drainage (amount, consistency, odor) Scant serous drainage from right medial malleolar ulcer.   Periwound:AS described above.  Bilateral LEs are dry and intact with evidence of previous wounding and wound healing (scars)  Dressing procedure/placement/frequency:Bedside RN indicates that patient is not likely to wear a pressure redistribution boot (e.g. Prevalon) and ultimately, this is an intervention I would recommend. Patient is not asked about this intervention as he has asked that I not disturb him again while he is watching television.  If he is agreeable to this intervention, bedside staff is abel to order/obtain these.  In the interim, I will order an enzymatic debriding agent (collagenase/Santyl) to the bilateral areas of eschar daily and top this with a saline gauze and a silicone foam to act as a padding. WOC nursing team will not follow, but will remain available to this patient, the nursing and medical team.  Please re-consult if needed.If patient is willing, follow up by an outpatient wound care center or [facility] a [facility] should be [facility]. Thanks, Ladona MowLaurie Charelle Petrakis, MSN, RN, GNP, [place], CWON-AP [phone])

## 2014-11-20 NOTE — Progress Notes (Signed)
Pharmacy: Re- heparin  Patient's a 74 y.o M on heparin for ACS.  Heparin level remains subtherapeutic at 0.23 despite rate increased to 1100 units/hr.    Plan: - increase rate to 1250 units/hr - will recheck another 8 hour level  Dorna LeitzAnh Odai Wimmer, PharmD, BCPS

## 2014-11-20 NOTE — Progress Notes (Addendum)
Subjective:  Readmitted with IM overload and acute on chronic systolic heart failure last night.  Feels better today but is still coughing.  Objective:  Vital Signs in the last 24 hours: BP 123/85 mmHg  Pulse 82  Temp(Src) 97.3 F (36.3 C) (Oral)  Resp 20  Ht 5\' 9"  (1.753 m)  Wt 71.26 kg (157 lb 1.6 oz)  BMI 23.19 kg/m2  SpO2 93%  Physical Exam: Older black male in no acute distress Lungs:  Rhonchi, reduced breath sounds at bases Cardiac:  Regular rhythm, normal S1 and S2, no S3 Abdomen:  Soft, nontender, no masses Extremities:  Trace edema present, bandage over  Intake/Output from previous day: 01/15 0701 - 01/16 0700 In: 270 [P.O.:240; I.V.:30] Out: 1700 [Urine:1700] Weight Filed Weights   11/19/14 2011 11/20/14 0335  Weight: 68.04 kg (150 lb) 71.26 kg (157 lb 1.6 oz)    Lab Results: Basic Metabolic Panel:  Recent Labs  16/08/9600/15/16 2303 11/20/14 0420  NA 136 137  K 4.1 3.8  CL 105 101  CO2 28 31  GLUCOSE 145* 127*  BUN 14 12  CREATININE 1.08 1.20    CBC:  Recent Labs  11/19/14 2303 11/20/14 0420  WBC 5.9 5.9  NEUTROABS 3.9 3.4  HGB 11.4* 12.0*  HCT 35.3* 36.7*  MCV 93.6 93.4  PLT 188 210    BNP    Component Value Date/Time   PROBNP 7427.0* 08/12/2014 1756   Cardiac Panel (last 3 results)  Recent Labs  11/19/14 2303 11/20/14 0420 11/20/14 0958  TROPONINI 1.17* 1.35* 1.98*    PROTIME: Lab Results  Component Value Date   INR 1.17 08/12/2014    Telemetry: Sinus rhythm  Assessment/Plan:  1.  Acute on chronic systolic heart failure. 2.  Mild troponin elevation that may be demand ischemia in the setting of heart failure. 3.  Probable medical noncompliance   recommendations:  This may be ischemic because of regional wall motion abnormalities, but evidently in past has not wanted to pursue this.  Continue diuresis at this time.  May be worthwhile re-looking at an ischemic evaluation in light of his recurrent admissions here.  He will  need a higher dose of furosemide.   Darden PalmerW. Spencer Wilmore Holsomback, Jr.  MD [facility]  11/20/2014, 12:10 PM

## 2014-11-20 NOTE — Progress Notes (Signed)
ANTICOAGULATION CONSULT NOTE - Follow Up Consult  Pharmacy Consult for Heparin Indication: chest pain/ACS  No Known Allergies  Patient Measurements: Height: 5\' 9"  (175.3 cm) Weight: 157 lb 1.6 oz (71.26 kg) IBW/kg (Calculated) : 70.7 Heparin Dosing Weight: 71kg  Vital Signs: Temp: 97.3 F (36.3 C) (01/16 0336) Temp Source: Oral (01/16 0336) BP: 123/85 mmHg (01/16 0336) Pulse Rate: 82 (01/16 0336)  Labs:  Recent Labs  11/19/14 2303 11/20/14 0420 11/20/14 0950  HGB 11.4* 12.0*  --   HCT 35.3* 36.7*  --   PLT 188 210  --   HEPARINUNFRC  --   --  <0.10*  CREATININE 1.08 1.20  --   TROPONINI 1.17* 1.35*  --     Estimated Creatinine Clearance: 56.5 mL/min (by C-G formula based on Cr of 1.2).   Medications:  Heparin @ 800 units/hr  Assessment: 73yom started on heparin early this morning for elevated troponin. Initial heparin level is undetectable. No issues with infusion per RN. Draw ~ 2 hours early so still could be accumulating. CBC stable.  Goal of Therapy:  Heparin level 0.3-0.7 units/ml Monitor platelets by anticoagulation protocol: Yes   Plan:  1) Rebolus heparin 2000 units x 1 2) Increase heparin drip to 1100 units/hr 3) Recheck heparin level in 8 hours  Fredrik RiggerMarkle, Etana Beets Sue 11/20/2014,10:57 AM

## 2014-11-21 ENCOUNTER — Inpatient Hospital Stay (HOSPITAL_COMMUNITY): Payer: Medicare (Managed Care)

## 2014-11-21 DIAGNOSIS — I5021 Acute systolic (congestive) heart failure: Secondary | ICD-10-CM

## 2014-11-21 DIAGNOSIS — E876 Hypokalemia: Secondary | ICD-10-CM | POA: Diagnosis present

## 2014-11-21 DIAGNOSIS — E1140 Type 2 diabetes mellitus with diabetic neuropathy, unspecified: Secondary | ICD-10-CM

## 2014-11-21 DIAGNOSIS — J90 Pleural effusion, not elsewhere classified: Secondary | ICD-10-CM

## 2014-11-21 DIAGNOSIS — J9601 Acute respiratory failure with hypoxia: Secondary | ICD-10-CM

## 2014-11-21 DIAGNOSIS — I214 Non-ST elevation (NSTEMI) myocardial infarction: Principal | ICD-10-CM

## 2014-11-21 DIAGNOSIS — L97509 Non-pressure chronic ulcer of other part of unspecified foot with unspecified severity: Secondary | ICD-10-CM

## 2014-11-21 LAB — BASIC METABOLIC PANEL
Anion gap: 4 — ABNORMAL LOW (ref 5–15)
BUN: 20 mg/dL (ref 6–23)
CALCIUM: 8.3 mg/dL — AB (ref 8.4–10.5)
CHLORIDE: 98 meq/L (ref 96–112)
CO2: 34 mmol/L — ABNORMAL HIGH (ref 19–32)
Creatinine, Ser: 1.25 mg/dL (ref 0.50–1.35)
GFR calc Af Amer: 65 mL/min — ABNORMAL LOW (ref >90)
GFR calc non Af Amer: 56 mL/min — ABNORMAL LOW (ref >90)
Glucose, Bld: 160 mg/dL — ABNORMAL HIGH (ref 70–99)
Potassium: 3.3 mmol/L — ABNORMAL LOW (ref 3.5–5.1)
Sodium: 136 mmol/L (ref 135–145)

## 2014-11-21 LAB — GLUCOSE, CAPILLARY
GLUCOSE-CAPILLARY: 190 mg/dL — AB (ref 70–99)
GLUCOSE-CAPILLARY: 88 mg/dL (ref 70–99)
Glucose-Capillary: 250 mg/dL — ABNORMAL HIGH (ref 70–99)

## 2014-11-21 LAB — URINE CULTURE
Colony Count: NO GROWTH
Culture: NO GROWTH

## 2014-11-21 LAB — CBC
HCT: 33.6 % — ABNORMAL LOW (ref 39.0–52.0)
Hemoglobin: 11 g/dL — ABNORMAL LOW (ref 13.0–17.0)
MCH: 29.5 pg (ref 26.0–34.0)
MCHC: 32.7 g/dL (ref 30.0–36.0)
MCV: 90.1 fL (ref 78.0–100.0)
Platelets: 204 10*3/uL (ref 150–400)
RBC: 3.73 MIL/uL — ABNORMAL LOW (ref 4.22–5.81)
RDW: 14.8 % (ref 11.5–15.5)
WBC: 5.4 10*3/uL (ref 4.0–10.5)

## 2014-11-21 LAB — HEPARIN LEVEL (UNFRACTIONATED)
HEPARIN UNFRACTIONATED: 0.41 [IU]/mL (ref 0.30–0.70)
Heparin Unfractionated: 0.42 IU/mL (ref 0.30–0.70)

## 2014-11-21 MED ORDER — POTASSIUM CHLORIDE CRYS ER 20 MEQ PO TBCR
40.0000 meq | EXTENDED_RELEASE_TABLET | Freq: Two times a day (BID) | ORAL | Status: DC
  Administered 2014-11-21 – 2014-11-22 (×3): 40 meq via ORAL
  Filled 2014-11-21 (×6): qty 2

## 2014-11-21 MED ORDER — METFORMIN HCL 500 MG PO TABS
500.0000 mg | ORAL_TABLET | Freq: Two times a day (BID) | ORAL | Status: DC
  Administered 2014-11-21: 500 mg via ORAL
  Filled 2014-11-21 (×4): qty 1

## 2014-11-21 MED ORDER — ENOXAPARIN SODIUM 40 MG/0.4ML ~~LOC~~ SOLN
40.0000 mg | Freq: Every day | SUBCUTANEOUS | Status: DC
  Filled 2014-11-21 (×3): qty 0.4

## 2014-11-21 NOTE — Progress Notes (Signed)
Subjective:  Still coughing and short of breath.  Went down for a chest x-ray that shows continued bilateral pleural effusions as well as pneumonia.  Spoke to him ischemic evaluation, and he adamantly refuses catheterization and says that if he dies that would be God's will.  No chest pain.    Objective:  Vital Signs in the last 24 hours: BP 102/57 mmHg  Pulse 73  Temp(Src) 97.9 F (36.6 C) (Oral)  Resp 20  Ht 5\' 9"  (1.753 m)  Wt 68.04 kg (150 lb)  BMI 22.14 kg/m2  SpO2 95%  Physical Exam: Older black male in no acute distress Lungs:  Rhonchi reduced breath sounds at the bases Cardiac:  Regular rhythm, normal S1 and S2, no S3 Extremities:  1+ edema noted  Intake/Output from previous day: 01/16 0701 - 01/17 0700 In: 856.4 [P.O.:580; I.V.:276.4] Out: 826 [Urine:825; Stool:1] Weight Filed Weights   11/19/14 2011 11/20/14 0335 11/21/14 0527  Weight: 68.04 kg (150 lb) 71.26 kg (157 lb 1.6 oz) 68.04 kg (150 lb)    Lab Results: Basic Metabolic Panel:  Recent Labs  16/08/9600/16/16 0420 11/21/14 0434  NA 137 136  K 3.8 3.3*  CL 101 98  CO2 31 34*  GLUCOSE 127* 160*  BUN 12 20  CREATININE 1.20 1.25    CBC:  Recent Labs  11/19/14 2303 11/20/14 0420 11/21/14 0434  WBC 5.9 5.9 5.4  NEUTROABS 3.9 3.4  --   HGB 11.4* 12.0* 11.0*  HCT 35.3* 36.7* 33.6*  MCV 93.6 93.4 90.1  PLT 188 210 204    BNP    Component Value Date/Time   PROBNP 7427.0* 08/12/2014 1756   Cardiac Panel (last 3 results)  Recent Labs  11/20/14 0958 11/20/14 1530 11/21/14 0434  TROPONINI 1.98* 1.37* 1.62*   Telemetry: Sinus rhythm  Assessment/Plan:  1.  Acute on chronic systolic heart failure. 2.  Mild troponin elevation that may be demand ischemia in the setting of heart failure. 3.  Probable medical noncompliance 4.  Probable coronary artery disease on the basis of calcification noted but refuses catheterization.   recommendations:  Continue treatment for heart failure.  Also, would  continue antibiotics for pneumonia.  At this point in time, he is not willing to have catheterization, so really don't see a value in continuing heparin or pursuing a noninvasive workup.   Darden PalmerW. Spencer Olivia Pavelko, Jr.  MD [facility]  11/21/2014, 12:20 PM

## 2014-11-21 NOTE — Progress Notes (Signed)
ANTICOAGULATION CONSULT NOTE - Follow Up Consult  Pharmacy Consult for Heparin Indication: chest pain/ACS  No Known Allergies  Patient Measurements: Height: 5\' 9"  (175.3 cm) Weight: 150 lb (68.04 kg) IBW/kg (Calculated) : 70.7 Heparin Dosing Weight: 68kg  Vital Signs: Temp: 97.9 F (36.6 C) (01/17 0527) Temp Source: Oral (01/17 0527) BP: 102/57 mmHg (01/17 1001) Pulse Rate: 73 (01/17 1001)  Labs:  Recent Labs  11/19/14 2303 11/20/14 0420  11/20/14 0958 11/20/14 1530 11/20/14 1854 11/21/14 0434 11/21/14 1110  HGB 11.4* 12.0*  --   --   --   --  11.0*  --   HCT 35.3* 36.7*  --   --   --   --  33.6*  --   PLT 188 210  --   --   --   --  204  --   HEPARINUNFRC  --   --   < >  --   --  0.23* 0.42 0.41  CREATININE 1.08 1.20  --   --   --   --  1.25  --   TROPONINI 1.17* 1.35*  --  1.98* 1.37*  --  1.62*  --   < > = values in this interval not displayed.  Estimated Creatinine Clearance: 52.1 mL/min (by C-G formula based on Cr of 1.25).   Medications:  Heparin @ 1250 units/hr  Assessment: Russell Hamilton continues on heparin for elevated troponin. May need ischemic evaluation though elevated troponin may be demand ischemia from HF exacerbation. Confirmatory heparin level is therapeutic. CBC is stable. No bleeding reported.  Goal of Therapy:  Heparin level 0.3-0.7 units/ml Monitor platelets by anticoagulation protocol: Yes   Plan:  1) Continue heparin at 1250 units/hr 2) Follow up heparin level, CBC in AM  Fredrik RiggerMarkle, [place]xston Quinter Sue 11/21/2014,12:11 PM

## 2014-11-21 NOTE — Progress Notes (Signed)
Pt refused blood sugar check, pt agitated, don't want to be disturbed and just want to go to sleep. MD on call notified.

## 2014-11-21 NOTE — Progress Notes (Signed)
ANTICOAGULATION CONSULT NOTE - Follow Up Consult  Pharmacy Consult for heparin Indication: chest pain/ACS   Labs:  Recent Labs  11/19/14 2303 11/20/14 0420 11/20/14 0950 11/20/14 0958 11/20/14 1530 11/20/14 1854 11/21/14 0434  HGB 11.4* 12.0*  --   --   --   --  11.0*  HCT 35.3* 36.7*  --   --   --   --  33.6*  PLT 188 210  --   --   --   --  204  HEPARINUNFRC  --   --  <0.10*  --   --  0.23* 0.42  CREATININE 1.08 1.20  --   --   --   --   --   TROPONINI 1.17* 1.35*  --  1.98* 1.37*  --   --      Assessment/Plan:  74yo male therapeutic on heparin after rate increases. Will continue gtt at current rate and confirm stable with additional level.   Vernard GamblesVeronda Lindsey Demonte, PharmD, BCPS  11/21/2014,6:03 AM

## 2014-11-21 NOTE — Progress Notes (Addendum)
[facility] PROGRESS NOTE  Verbon Giangregorio ZOX:[id number] DOB: [birth year] DOA: 11/19/2014 PCP: No PCP Per Patient  Assessment/Plan:  Principal Problem:   Acute systolic CHF (congestive heart failure), NYHA class 4 improving clinically.  Wean oxygen. Increase activity. Active Problems:   HTN (hypertension) controlled   Type II diabetes mellitus: not getting cath. Resume metformin   Foot ulcer: seen by [facility]. Santyl ordered.   NSTEMI (non-ST elevated myocardial infarction): refuses cath. Heparin gtt stopped   Pleural effusion: no clinical evidence of pneumonia. Consolidation on CXR likely atx   Acute respiratory failure with hypoxia Hypokalemia: replete  HPI/Subjective: No complaints  Objective: Filed Vitals:   11/21/14 1423  BP: 111/64  Pulse: 73  Temp: 97.7 F (36.5 C)  Resp: 22    Intake/Output Summary (Last 24 hours) at 11/21/14 1457 Last data filed at 11/21/14 1354  Gross per 24 hour  Intake 1395.96 ml  Output   1431 ml  Net -35.04 ml   Filed Weights   11/19/14 2011 11/20/14 0335 11/21/14 0527  Weight: 68.04 kg (150 lb) 71.26 kg (157 lb 1.6 oz) 68.04 kg (150 lb)    Exam:   General:  Asleep. Arousable. comfortable  Cardiovascular: RRR without MGR  Respiratory: CTA without WRR  Abdomen: S, NT, ND  Ext: dressings CDI  Basic Metabolic Panel:  Recent Labs Lab 11/19/14 2303 11/20/14 0420 11/21/14 0434  NA 136 137 136  K 4.1 3.8 3.3*  CL 105 101 98  CO2 28 31 34*  GLUCOSE 145* 127* 160*  BUN CREATININE 1.08 1.20 1.25  CALCIUM 8.4 8.9 8.3*   Liver Function Tests:  Recent Labs Lab 11/19/14 2303 11/20/14 0420  AST 27 30  ALT 21 23  ALKPHOS 154* 164*  BILITOT 0.8 0.8  PROT 6.9 7.5  ALBUMIN 3.3* 3.4*   No results for input(s): LIPASE, AMYLASE in the last 168 hours. No results for input(s): AMMONIA in the last 168 hours. CBC:  Recent Labs Lab 11/19/14 2303 11/20/14 0420 11/21/14 0434  WBC 5.9 5.9 5.4  NEUTROABS 3.9 3.4  --    HGB 11.4* 12.0* 11.0*  HCT 35.3* 36.7* 33.6*  MCV 93.6 93.4 90.1  PLT 188 210 204   Cardiac Enzymes:  Recent Labs Lab 11/19/14 2303 11/20/14 0420 11/20/14 0958 11/20/14 1530 11/21/14 0434  TROPONINI 1.17* 1.35* 1.98* 1.37* 1.62*   BNP (last 3 results)  Recent Labs  08/12/14 1756  PROBNP 7427.0*   CBG:  Recent Labs Lab 11/20/14 1135 11/20/14 1721 11/20/14 2215 11/21/14 0550 11/21/14 1145  GLUCAP 106* 164* 123* 190* 250*    Recent Results (from the past 240 hour(s))  Culture, Urine     Status: None   Collection Time: 11/20/14  3:48 AM  Result Value Ref Range Status   Specimen Description URINE, CLEAN CATCH  Final   Special Requests NONE  Final   Colony Count NO GROWTH Performed at [facility]   Final   Culture NO GROWTH Performed at [facility]   Final   Report Status 11/21/2014 FINAL  Final     Studies: Dg Chest 2 View  11/21/2014   CLINICAL DATA:  Follow-up bilateral pleural effusions.  EXAM: CHEST  2 VIEW  COMPARISON:  11/19/2014, 08/12/2014.  FINDINGS: Moderate-sized bilateral pleural effusions, slightly decreased in size since the examination 2 days ago. Improved aeration in the right lower lobe. Stable dense consolidation in the left lower lobe. No new pulmonary parenchymal abnormalities. Cardiac silhouette moderately enlarged  but stable. Mild pulmonary venous hypertension without overt edema. Degenerative changes and DISH involving the thoracic spine.  IMPRESSION: 1. Moderate-sized bilateral pleural effusions, slightly decreased in size since the examination 2 days ago. 2. Improved aeration in the right lower lobe. Stable dense passive atelectasis and/or pneumonia in the left lower lobe. 3. No new abnormalities.   Electronically Signed   By: Hulan Saashomas  Lawrence M.D.   On: 11/21/2014 10:56   Dg Chest 2 View  11/19/2014   CLINICAL DATA:  Two day history of productive cough and shortness of breath. Current history of hypertension. Prior  episodes of CHF.  EXAM: CHEST  2 VIEW  COMPARISON:  Two-view chest x-ray and CTA chest 08/12/2014.  FINDINGS: Cardiac silhouette moderately enlarged for the AP semi-erect technique, unchanged. Pulmonary vascularity normal without evidence of pulmonary edema currently. Large bilateral pleural effusions, left greater than right, with associated dense consolidation in the lower lobes. Fluid in the major fissure on the right. Upper lobes remain clear. Degenerative changes throughout the thoracic and visualized lumbar spine and in the right acromioclavicular joint.  IMPRESSION: Stable moderate cardiomegaly without evidence of pulmonary edema currently. Large bilateral pleural effusions, left greater than right, with associated dense passive atelectasis versus pneumonia in the lower lobes.   Electronically Signed   By: Hulan Saashomas  Lawrence M.D.   On: 11/19/2014 21:01   Dg Foot 2 Views Right  11/20/2014   CLINICAL DATA:  Foot ulcer over medial malleolus.  EXAM: RIGHT FOOT - 2 VIEW  COMPARISON:  None.  FINDINGS: Skin thickening and probable ulcer about the medial ankle. There is a questionable adjacent periosteal reaction of the medial malleolus. This would be better assessed on dedicated ankle series. There is mild hallux valgus and degenerative change in the first metatarsal phalangeal joint. No acute fracture. Moderate plantar calcaneal spur and small Achilles tendon enthesophyte. There is an os navicular. No findings in the foot to suggest osteomyelitis.  IMPRESSION: Soft tissue ulcer about the medial ankle. There is questionable subjacent periosteal reaction about the medial malleolus. This could be better assessed on dedicated three-view ankle radiographs. There is no evidence of osteomyelitis of foot.  Mild hallux valgus and degenerative change of the first metatarsal phalangeal joint.   Electronically Signed   By: Rubye OaksMelanie  Ehinger M.D.   On: 11/20/2014 02:20    Scheduled Meds: . aspirin  81 mg Oral Daily  .  carvedilol  6.25 mg Oral BID WC  . collagenase   Topical Daily  . doxycycline  100 mg Oral Q12H  . enoxaparin (LOVENOX) injection  40 mg Subcutaneous Daily  . feeding supplement (ENSURE COMPLETE)  237 mL Oral BID BM  . furosemide  40 mg Intravenous BID  . gabapentin  300 mg Oral TID  . guaiFENesin  600 mg Oral BID  . insulin aspart  0-15 Units Subcutaneous TID WC  . insulin aspart  0-5 Units Subcutaneous QHS  . lisinopril  5 mg Oral Daily  . potassium chloride  40 mEq Oral BID  . sodium chloride  3 mL Intravenous Q12H   Continuous Infusions:   Time spent: 25 minutes  Monroe Toure L  [facility] [email or web address], password [email or web address] 11/21/2014, 2:57 PM  LOS: 2 days

## 2014-11-21 NOTE — Progress Notes (Signed)
UR Completed.  [phone]  

## 2014-11-22 DIAGNOSIS — N179 Acute kidney failure, unspecified: Secondary | ICD-10-CM | POA: Diagnosis not present

## 2014-11-22 LAB — BASIC METABOLIC PANEL
Anion gap: 4 — ABNORMAL LOW (ref 5–15)
BUN: 20 mg/dL (ref 6–23)
CALCIUM: 8.4 mg/dL (ref 8.4–10.5)
CHLORIDE: 96 meq/L (ref 96–112)
CO2: 35 mmol/L — ABNORMAL HIGH (ref 19–32)
CREATININE: 1.4 mg/dL — AB (ref 0.50–1.35)
GFR calc Af Amer: 57 mL/min — ABNORMAL LOW (ref >90)
GFR calc non Af Amer: 49 mL/min — ABNORMAL LOW (ref >90)
GLUCOSE: 183 mg/dL — AB (ref 70–99)
POTASSIUM: 3.7 mmol/L (ref 3.5–5.1)
Sodium: 135 mmol/L (ref 135–145)

## 2014-11-22 LAB — TROPONIN I: TROPONIN I: 1.62 ng/mL — AB (ref <0.031)

## 2014-11-22 LAB — GLUCOSE, CAPILLARY
Glucose-Capillary: 158 mg/dL — ABNORMAL HIGH (ref 70–99)
Glucose-Capillary: 164 mg/dL — ABNORMAL HIGH (ref 70–99)

## 2014-11-22 MED ORDER — GLIMEPIRIDE 2 MG PO TABS
2.0000 mg | ORAL_TABLET | Freq: Every day | ORAL | Status: DC
  Administered 2014-11-22 – 2014-11-23 (×2): 2 mg via ORAL
  Filled 2014-11-22 (×3): qty 1

## 2014-11-22 MED ORDER — NICOTINE 7 MG/24HR TD PT24
7.0000 mg | MEDICATED_PATCH | Freq: Every day | TRANSDERMAL | Status: DC
  Filled 2014-11-22 (×2): qty 1

## 2014-11-22 MED ORDER — FUROSEMIDE 80 MG PO TABS
80.0000 mg | ORAL_TABLET | Freq: Two times a day (BID) | ORAL | Status: DC
Start: 2014-11-22 — End: 2014-11-23
  Administered 2014-11-22 – 2014-11-23 (×2): 80 mg via ORAL
  Filled 2014-11-22 (×4): qty 1

## 2014-11-22 MED ORDER — HYDROCODONE-ACETAMINOPHEN 5-325 MG PO TABS
1.0000 | ORAL_TABLET | Freq: Four times a day (QID) | ORAL | Status: DC | PRN
  Administered 2014-11-22: 1 via ORAL
  Filled 2014-11-22: qty 1

## 2014-11-22 MED ORDER — FUROSEMIDE 40 MG PO TABS
40.0000 mg | ORAL_TABLET | Freq: Two times a day (BID) | ORAL | Status: DC
  Administered 2014-11-22: 40 mg via ORAL
  Filled 2014-11-22 (×3): qty 1

## 2014-11-22 NOTE — Progress Notes (Signed)
Pt refused to walk in the hallway. Stated that if he couldn't walk outside, he wasn't walking.

## 2014-11-22 NOTE — Progress Notes (Signed)
[facility] PROGRESS NOTE  Russell MathJames Hamilton RUE:[id number] DOB: 07/11/1941 DOA: 11/19/2014 PCP: No PCP Per Patient Summary 74 yo male presented with dyspnea, found to be in CHF. Elevated troponins, refuses catheterization.  Denies chest pain.  Assessment/Plan:  Principal Problem:   Acute systolic CHF (congestive heart failure),  improving clinically.  Wean oxygen. Increase activity. EF 40% in October. Likely home in am if stable. Creatinine increased today, so monitor another day. Will need higher dose lasix at home Active Problems:   HTN (hypertension) controlled   Type II diabetes mellitus: d/c metformin due to creatinine jump. Alternate agent safer in this elderly patient with CHF, on lasix and ACE   Foot ulcer: seen by [facility]. Santyl ordered.    NSTEMI (non-ST elevated myocardial infarction): refuses cath. Heparin gtt stopped   Pleural effusion: no clinical evidence of pneumonia. Consolidation on CXR likely atx   Acute respiratory failure with hypoxia: wean oxygen Hypokalemia: corrected Acute kidney injury; creatinine up today. Hold ace inhibitor. Change lasix to po. Will stop metformin and change to amaryl  HPI/Subjective: Does not feel well enough to go home  Objective: Filed Vitals:   11/22/14 1116  BP: 125/70  Pulse: 75  Temp: 98.2 F (36.8 C)  Resp: 20    Intake/Output Summary (Last 24 hours) at 11/22/14 1537 Last data filed at 11/22/14 1337  Gross per 24 hour  Intake   1040 ml  Output   1200 ml  Net   -160 ml   Filed Weights   11/20/14 0335 11/21/14 0527 11/22/14 0523  Weight: 71.26 kg (157 lb 1.6 oz) 68.04 kg (150 lb) 68 kg (149 lb 14.6 oz)    Exam:   General:  Appears comfortable lying flat in bed  Cardiovascular: RRR without MGR  Respiratory: CTA without WRR  Abdomen: S, NT, ND  Ext: dressings CDI. No edema  Basic Metabolic Panel:  Recent Labs Lab 11/19/14 2303 11/20/14 0420 11/21/14 0434 11/22/14 0320  NA 136 137 136 135  K 4.1 3.8 3.3*  3.7  CL 105 101 98 96  CO2 28 31 34* 35*  GLUCOSE 145* 127* 160* 183*  BUN 14 12 20 20   CREATININE 1.08 1.20 1.25 1.40*  CALCIUM 8.4 8.9 8.3* 8.4   Liver Function Tests:  Recent Labs Lab 11/19/14 2303 11/20/14 0420  AST 27 30  ALT 21 23  ALKPHOS 154* 164*  BILITOT 0.8 0.8  PROT 6.9 7.5  ALBUMIN 3.3* 3.4*   No results for input(s): LIPASE, AMYLASE in the last 168 hours. No results for input(s): AMMONIA in the last 168 hours. CBC:  Recent Labs Lab 11/19/14 2303 11/20/14 0420 11/21/14 0434  WBC 5.9 5.9 5.4  NEUTROABS 3.9 3.4  --   HGB 11.4* 12.0* 11.0*  HCT 35.3* 36.7* 33.6*  MCV 93.6 93.4 90.1  PLT 188 210 204   Cardiac Enzymes:  Recent Labs Lab 11/19/14 2303 11/20/14 0420 11/20/14 0958 11/20/14 1530 11/21/14 0434  TROPONINI 1.17* 1.35* 1.98* 1.37* 1.62*   BNP (last 3 results)  Recent Labs  08/12/14 1756  PROBNP 7427.0*   CBG:  Recent Labs Lab 11/20/14 2215 11/21/14 0550 11/21/14 1145 11/21/14 1623 11/22/14 1104  GLUCAP 123* 190* 250* 88 158*    Recent Results (from the past 240 hour(s))  Culture, Urine     Status: None   Collection Time: 11/20/14  3:48 AM  Result Value Ref Range Status   Specimen Description URINE, CLEAN CATCH  Final   Special Requests NONE  Final  Colony Count NO GROWTH Performed at [facility]   Final   Culture NO GROWTH Performed at [facility]   Final   Report Status 11/21/2014 FINAL  Final     Studies: Dg Chest 2 View  11/21/2014   CLINICAL DATA:  Follow-up bilateral pleural effusions.  EXAM: CHEST  2 VIEW  COMPARISON:  11/19/2014, 08/12/2014.  FINDINGS: Moderate-sized bilateral pleural effusions, slightly decreased in size since the examination 2 days ago. Improved aeration in the right lower lobe. Stable dense consolidation in the left lower lobe. No new pulmonary parenchymal abnormalities. Cardiac silhouette moderately enlarged but stable. Mild pulmonary venous hypertension without overt  edema. Degenerative changes and DISH involving the thoracic spine.  IMPRESSION: 1. Moderate-sized bilateral pleural effusions, slightly decreased in size since the examination 2 days ago. 2. Improved aeration in the right lower lobe. Stable dense passive atelectasis and/or pneumonia in the left lower lobe. 3. No new abnormalities.   Electronically Signed   By: Hulan Saas M.D.   On: 11/21/2014 10:56    Scheduled Meds: . aspirin  81 mg Oral Daily  . carvedilol  6.25 mg Oral BID WC  . collagenase   Topical Daily  . enoxaparin (LOVENOX) injection  40 mg Subcutaneous Daily  . feeding supplement (ENSURE COMPLETE)  237 mL Oral BID BM  . furosemide  80 mg Oral BID  . gabapentin  300 mg Oral TID  . glimepiride  2 mg Oral Q breakfast  . guaiFENesin  600 mg Oral BID  . insulin aspart  0-15 Units Subcutaneous TID WC  . insulin aspart  0-5 Units Subcutaneous QHS  . lisinopril  5 mg Oral Daily  . potassium chloride  40 mEq Oral BID  . sodium chloride  3 mL Intravenous Q12H   Continuous Infusions:   Time spent: 25 minutes  Russell Hamilton  [facility] [email or web address], password [facility] 11/22/2014, 3:37 PM  LOS: 3 days

## 2014-11-22 NOTE — Progress Notes (Signed)
Pt is complaining about his feet hurting. He moans and groans, but states that the gabapentin does not help, and that the tylenol that he has ordered does not work either. He refuses his lovenox. He complains and groans about everything. He also yells at staff. He states that he is not HOH, but he speaks very loud.  Text paged Dr. Lendell CapriceSullivan to see if i can get some other kind of pain medication ordered. The pain in his feet is no continuous. He states that when the pain comes on its rated at 6 or 7 on pain scale. Pt refuses to take off his jeans, and he is also refusing CBG's at times.

## 2014-11-22 NOTE — Progress Notes (Signed)
Patient Name: Alano Blasco Date of Encounter: 11/22/2014     Principal Problem:   Acute systolic CHF (congestive heart failure), NYHA class 4 Active Problems:   HTN (hypertension)   Type II diabetes mellitus   Foot ulcer   NSTEMI (non-ST elevated myocardial infarction)   Pleural effusion   Acute respiratory failure with hypoxia   Hypokalemia    SUBJECTIVE  Breathing better. No CP. Has chronic back pain and LE pain below both knee.  CURRENT MEDS . aspirin  81 mg Oral Daily  . carvedilol  6.25 mg Oral BID WC  . collagenase   Topical Daily  . enoxaparin (LOVENOX) injection  40 mg Subcutaneous Daily  . feeding supplement (ENSURE COMPLETE)  237 mL Oral BID BM  . furosemide  40 mg Oral BID  . gabapentin  300 mg Oral TID  . guaiFENesin  600 mg Oral BID  . insulin aspart  0-15 Units Subcutaneous TID WC  . insulin aspart  0-5 Units Subcutaneous QHS  . lisinopril  5 mg Oral Daily  . potassium chloride  40 mEq Oral BID  . sodium chloride  3 mL Intravenous Q12H    OBJECTIVE  Filed Vitals:   11/21/14 1423 11/21/14 1659 11/21/14 2015 11/22/14 0523  BP: 111/64 124/71 103/58 118/61  Pulse: 73 79 74 82  Temp: 97.7 F (36.5 C)  97.7 F (36.5 C) 98 F (36.7 C)  TempSrc: Oral  Oral Oral  Resp: Height:      Weight:    149 lb 14.6 oz (68 kg)  SpO2: 99% 99% 99% 100%    Intake/Output Summary (Last 24 hours) at 11/22/14 1034 Last data filed at 11/22/14 0824  Gross per 24 hour  Intake 1471.54 ml  Output   1275 ml  Net 196.54 ml   Filed Weights   11/20/14 0335 11/21/14 0527 11/22/14 0523  Weight: 157 lb 1.6 oz (71.26 kg) 150 lb (68.04 kg) 149 lb 14.6 oz (68 kg)    PHYSICAL EXAM  General: Pleasant, NAD. Neuro: Alert and oriented X 3. Moves all extremities spontaneously. Psych: Normal affect. HEENT:  Normal  Neck: Supple without bruits. +JVD. Lungs:  Resp regular and unlabored. mild decrease in breath sound bilaterally, otherwise no obvious rale. Heart: RRR  no s3, s4, or murmurs. Abdomen: Soft, non-tender, non-distended, BS + x 4.  Extremities: No clubbing, cyanosis or edema. DP/PT/Radials 2+ and equal bilaterally. Did not appreciate significant LE erythema  Accessory Clinical Findings  CBC  Recent Labs  11/19/14 2303 11/20/14 0420 11/21/14 0434  WBC 5.9 5.9 5.4  NEUTROABS 3.9 3.4  --   HGB 11.4* 12.0* 11.0*  HCT 35.3* 36.7* 33.6*  MCV 93.6 93.4 90.1  PLT 188 210 204   Basic Metabolic Panel  Recent Labs  11/21/14 0434 11/22/14 0320  NA 136 135  K 3.3* 3.7  CL 98 96  CO2 34* 35*  GLUCOSE 160* 183*  BUN 20 20  CREATININE 1.25 1.40*  CALCIUM 8.3* 8.4   Liver Function Tests  Recent Labs  11/19/14 2303 11/20/14 0420  AST 27 30  ALT 21 23  ALKPHOS 154* 164*  BILITOT 0.8 0.8  PROT 6.9 7.5  ALBUMIN 3.3* 3.4*   Cardiac Enzymes  Recent Labs  11/20/14 0958 11/20/14 1530 11/21/14 0434  TROPONINI 1.98* 1.37* 1.62*   Hemoglobin A1C  Recent Labs  11/20/14 0420  HGBA1C 7.5*   Fasting Lipid Panel  Recent Labs  11/20/14 0420  CHOL  167  HDL 55  LDLCALC 95  TRIG 85  CHOLHDL 3.0   Thyroid Function Tests  Recent Labs  11/20/14 0420  TSH 1.534    TELE NSR with HR 70s    ECG  No new EKG  Echocardiogram 08/13/2014  LV EF: 40%  ------------------------------------------------------------------- Indications:   CHF - 428.0.  ------------------------------------------------------------------- Study Conclusions  - Left ventricle: Diffuse hypokinesis possibly worse in inferior wall. The cavity size was mildly dilated. Wall thickness was normal. The estimated ejection fraction was 40%. - Mitral valve: Calcified annulus. There was mild regurgitation. - Left atrium: The atrium was mildly dilated. - Atrial septum: No defect or patent foramen ovale was identified. - Pulmonary arteries: PA peak pressure: 50 mm Hg (S). - Pericardium, extracardiac: A trivial pericardial effusion  was identified.     Radiology/Studies  Dg Chest 2 View  11/21/2014   CLINICAL DATA:  Follow-up bilateral pleural effusions.  EXAM: CHEST  2 VIEW  COMPARISON:  11/19/2014, 08/12/2014.  FINDINGS: Moderate-sized bilateral pleural effusions, slightly decreased in size since the examination 2 days ago. Improved aeration in the right lower lobe. Stable dense consolidation in the left lower lobe. No new pulmonary parenchymal abnormalities. Cardiac silhouette moderately enlarged but stable. Mild pulmonary venous hypertension without overt edema. Degenerative changes and DISH involving the thoracic spine.  IMPRESSION: 1. Moderate-sized bilateral pleural effusions, slightly decreased in size since the examination 2 days ago. 2. Improved aeration in the right lower lobe. Stable dense passive atelectasis and/or pneumonia in the left lower lobe. 3. No new abnormalities.   Electronically Signed   By: Hulan Saas M.D.   On: 11/21/2014 10:56   Dg Chest 2 View  11/19/2014   CLINICAL DATA:  Two day history of productive cough and shortness of breath. Current history of hypertension. Prior episodes of CHF.  EXAM: CHEST  2 VIEW  COMPARISON:  Two-view chest x-ray and CTA chest 08/12/2014.  FINDINGS: Cardiac silhouette moderately enlarged for the AP semi-erect technique, unchanged. Pulmonary vascularity normal without evidence of pulmonary edema currently. Large bilateral pleural effusions, left greater than right, with associated dense consolidation in the lower lobes. Fluid in the major fissure on the right. Upper lobes remain clear. Degenerative changes throughout the thoracic and visualized lumbar spine and in the right acromioclavicular joint.  IMPRESSION: Stable moderate cardiomegaly without evidence of pulmonary edema currently. Large bilateral pleural effusions, left greater than right, with associated dense passive atelectasis versus pneumonia in the lower lobes.   Electronically Signed   By: Hulan Saas  M.D.   On: 11/19/2014 21:01   Dg Foot 2 Views Right  11/20/2014   CLINICAL DATA:  Foot ulcer over medial malleolus.  EXAM: RIGHT FOOT - 2 VIEW  COMPARISON:  None.  FINDINGS: Skin thickening and probable ulcer about the medial ankle. There is a questionable adjacent periosteal reaction of the medial malleolus. This would be better assessed on dedicated ankle series. There is mild hallux valgus and degenerative change in the first metatarsal phalangeal joint. No acute fracture. Moderate plantar calcaneal spur and small Achilles tendon enthesophyte. There is an os navicular. No findings in the foot to suggest osteomyelitis.  IMPRESSION: Soft tissue ulcer about the medial ankle. There is questionable subjacent periosteal reaction about the medial malleolus. This could be better assessed on dedicated three-view ankle radiographs. There is no evidence of osteomyelitis of foot.  Mild hallux valgus and degenerative change of the first metatarsal phalangeal joint.   Electronically Signed   By: Rubye Oaks  M.D.   On: 11/20/2014 02:20    ASSESSMENT AND PLAN  This is a 74 y.o. 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   1. Acute on chronic systolic heart failure.  - -1.2L, weight did not change much, feeling better symptomatically expect continuous LE ache below bilateral knee (normal RLE ABI in Oct 2015, mild decreased ABI in LLE)  - transitioned to oral lasix by primary team. Continue coreg, lisinopril. BP does not allow any further uptitration of HF meds. Will arrange outpatient followup. Pending PT eval. Likely discharge soon  2. Moderate sized bilateral pleural effusion: mildly decreased after diuresis  - ?PNA on CXR in LLL, per IM, no clinical indication of PNA  - likely does not need thoracentesis at this point since he is improving  symptomatically and size of pleural effusion is decreasing after diuresis. However likely need outpt followup  3. Mild troponin elevation that may be demand ischemia in the setting of heart failure. 4.Probable medical noncompliance 5.Coronary artery disease on the basis of calcification noted but refuses catheterization. 6. HTN 7. Foot ulcer: per primary team, WOC  - ?if related to DM, previous ABI in Oct 2015, only mildly decreased ABI on L 8. Hypokalemia: replete  Signed, Azalee CourseMeng, Janelys Glassner PA-C Pager: [phone]

## 2014-11-22 NOTE — Progress Notes (Signed)
Notified MD Lendell CapriceSullivan notified of patient being diabetic and unable to Drink ensure without raising his blood sugar.

## 2014-11-22 NOTE — Care Management Note (Signed)
    Page 2 of 2   11/23/2014     3:51:23 PM CARE MANAGEMENT NOTE 11/23/2014  Patient:  Russell Hamilton,Russell Hamilton   Account Number:  [id number]  Date Initiated:  11/22/2014  Documentation initiated by:  Genae Strine  Subjective/Objective Assessment:   Pt adm on 11/19/14 with NSTEMI, PNA.  PTA, pt resided at home alone.  He states he ambulates with cane.  Pt states he has family in the area to assist with care, if needed.     Action/Plan:   PT eval pending to determine home needs.  Will cont to follow for recommendations.   Anticipated DC Date:  11/24/2014   Anticipated DC Plan:  HOME W HOME HEALTH SERVICES      DC Planning Services  CM consult      [facility]   Choice offered to / List presented to:  C-1 Patient        HH arranged  HH-1 RN  HH - 11 Patient Refused      HH agency  [facility]   Status of service:  Completed, signed off Medicare Important Message given?  YES (If response is "NO", the following Medicare IM given date fields will be blank) Date Medicare IM given:  11/22/2014 Medicare IM given by:  Ammaar Encina Date Additional Medicare IM given:   Additional Medicare IM given by:    Discharge Disposition:  HOME/SELF CARE  Per UR Regulation:  Reviewed for med. necessity/level of care/duration of stay  If discussed at Long Length of Stay Meetings, dates discussed:    Comments:  11/23/14 Sidney AceJulie Jacie Tristan, RN, BSN [phone] Pt for dc home today.  Pt has no PCP, and is agreeable [facility] up.  Follow up appt made for Jan 27 at 12:00pm. Pt has wounds bilat LE; [facility], and he is agreeable with this plan.  When [facility] entered room and explained that nurse would be teaching pt to change dressings on lower extremities, pt stated, "oh no, I am not touching them;  I am afraid."  Pt states niece used to work in homecare, but she has 5 children, and is too busy to  change his dressings daily.  Pt is completely adamant that he will NOT have anything to do with his lower leg dressings.  Explained that wounds will not heal correctly, and this could be quite detrimental to his health, but he still refuses.  Spoke with Dr. Benjamine MolaVann, advised follow up care at the wound care center if pt unwilling to have nurse come to home.  Nursing secretary to make follow up appt and put on AVS.

## 2014-11-23 DIAGNOSIS — N179 Acute kidney failure, unspecified: Secondary | ICD-10-CM

## 2014-11-23 LAB — BASIC METABOLIC PANEL
Anion gap: 8 (ref 5–15)
BUN: 20 mg/dL (ref 6–23)
CHLORIDE: 94 meq/L — AB (ref 96–112)
CO2: 32 mmol/L (ref 19–32)
CREATININE: 1.1 mg/dL (ref 0.50–1.35)
Calcium: 8.8 mg/dL (ref 8.4–10.5)
GFR calc Af Amer: 76 mL/min — ABNORMAL LOW (ref >90)
GFR calc non Af Amer: 66 mL/min — ABNORMAL LOW (ref >90)
Glucose, Bld: 102 mg/dL — ABNORMAL HIGH (ref 70–99)
Potassium: 3.8 mmol/L (ref 3.5–5.1)
Sodium: 134 mmol/L — ABNORMAL LOW (ref 135–145)

## 2014-11-23 LAB — GLUCOSE, CAPILLARY: Glucose-Capillary: 106 mg/dL — ABNORMAL HIGH (ref 70–99)

## 2014-11-23 MED ORDER — FUROSEMIDE 80 MG PO TABS: 80.0000 mg | ORAL_TABLET | Freq: Two times a day (BID) | ORAL | Status: DC

## 2014-11-23 MED ORDER — GLIMEPIRIDE 2 MG PO TABS: 2.0000 mg | ORAL_TABLET | Freq: Every day | ORAL | Status: DC

## 2014-11-23 MED ORDER — HYDROCODONE-ACETAMINOPHEN 5-325 MG PO TABS: 1.0000 | ORAL_TABLET | Freq: Four times a day (QID) | ORAL | Status: DC | PRN

## 2014-11-23 MED ORDER — POTASSIUM CHLORIDE CRYS ER 20 MEQ PO TBCR: 40.0000 meq | EXTENDED_RELEASE_TABLET | Freq: Two times a day (BID) | ORAL | Status: DC

## 2014-11-23 MED ORDER — ENSURE COMPLETE PO LIQD: 237.0000 mL | Freq: Two times a day (BID) | ORAL | Status: DC

## 2014-11-23 MED ORDER — COLLAGENASE 250 UNIT/GM EX OINT
TOPICAL_OINTMENT | Freq: Every day | CUTANEOUS | Status: DC
Start: 2014-11-23 — End: 2014-12-14

## 2014-11-23 NOTE — Progress Notes (Signed)
Pt Alert and oriented x 3. Skin warm and dry. Oral mucosa pink and moist.  Pt has been cooperating with some of his treatment today. He did refuse some of his meds. Pt very non-compliant with medications. He takes them when he feels like it. Pt is also still a current smoker, and he doesn't plan to quit. Continues to complain about lower leg pain and feet pain. Refuses to take gabapentin.

## 2014-11-23 NOTE — Discharge Summary (Signed)
Physician Discharge Summary  Nickoles Gregori ZOX:[id number] DOB: Nov 01, 1941 DOA: 11/19/2014  PCP: No PCP Per Patient-care management to arrange before d/c  Admit date: 11/19/2014 Discharge date: 11/23/2014  Time spent: 35 minutes  Recommendations for Outpatient Follow-up:  F/up on foot ulcer Cardiology follow up  Discharge Diagnoses:  Principal Problem:   Acute systolic CHF (congestive heart failure), NYHA class 4 Active Problems:   HTN (hypertension)   Type II diabetes mellitus   Foot ulcer   NSTEMI (non-ST elevated myocardial infarction)   Pleural effusion   Acute respiratory failure with hypoxia   Hypokalemia   AKI (acute kidney injury)   Discharge Condition: improved  Diet recommendation: cardiac/diabetic  Filed Weights   11/20/14 0335 11/21/14 0527 11/22/14 0523  Weight: 71.26 kg (157 lb 1.6 oz) 68.04 kg (150 lb) 68 kg (149 lb 14.6 oz)    History of present illness:  Rondrick Barreira is a 74 y.o. male   has a past medical history of Stroke (2010); Hypertension; Type II diabetes mellitus; Chronic systolic CHF (congestive heart failure); and Acute systolic CHF (congestive heart failure), NYHA class 4 (08/12/2014).   Presented with  Patient reports 2 day hx of cough and shortness of breath no fever. Non-productive cough. He reports taking his medications. Reports increased leg swelling. He was found to be hypoxic on RA down to 85%. Per Echo 10/15 EF 40%, diffuse hypokinesis possibly worse in the inferior wall. Mild MR, mild LA dilation, PA pressure 50. Patiet in the past has refused cardiac work up but curretnly is more agreeable stating if he has to do it he will. Cardiology consult has been called spoken to Dr. Gae Gallop who agrees with transfer to [facility] for further cardiac evaluation.   Hospitalist was called for admission for CHF exacerbation and elevated troponin  Hospital Course:  Acute systolic CHF (congestive heart failure), NYHA class 4 improving clinically. refusing O2   HTN (hypertension) controlled  Type II diabetes mellitus: not getting cath  Foot ulcer: seen by WOC. Santyl ordered.  NSTEMI (non-ST elevated myocardial infarction): refuses cath. Heparin gtt stopped  Pleural effusion: no clinical evidence of pneumonia. Consolidation on CXR likely atx  Acute respiratory failure with hypoxia Hypokalemia: replete Tobacco abuse- encourage cessation  Procedures:    Consultations:  cardiology  Discharge Exam: Filed Vitals:   11/23/14 0607  BP: 112/58  Pulse: 71  Temp: 97.8 F (36.6 C)  Resp: 18    General: A+OX3, NAD- not on O2 Cardiovascular: rrr Respiratory: decreased at base  Discharge Instructions   Discharge Instructions    (HEART FAILURE PATIENTS) Call MD:  Anytime you have any of the following symptoms: 1) 3 pound weight gain in 24 hours or 5 pounds in 1 week 2) shortness of breath, with or without a dry hacking cough 3) swelling in the hands, feet or stomach 4) if you have to sleep on extra pillows at night in order to breathe.    Complete by:  As directed      Diet - low sodium heart healthy    Complete by:  As directed      Diet Carb Modified    Complete by:  As directed      Discharge instructions    Complete by:  As directed   BMP 1 week Stop smoking     Increase activity slowly    Complete by:  As directed           Current Discharge Medication List    START taking  these medications   Details  collagenase (SANTYL) ointment Apply topically daily. Apply for 21 days Qty: 15 g, Refills: 0    feeding supplement, ENSURE COMPLETE, (ENSURE COMPLETE) LIQD Take 237 mLs by mouth 2 (two) times daily between meals. Qty: 20 Bottle, Refills: 0    glimepiride (AMARYL) 2 MG tablet Take 1 tablet (2 mg total) by mouth daily with breakfast. Qty: 30 tablet, Refills: 0    HYDROcodone-acetaminophen (NORCO/VICODIN) 5-325 MG per tablet Take 1 tablet by mouth every 6 (six) hours as needed for severe pain. Qty: 15 tablet, Refills: 0     potassium chloride SA (K-DUR,KLOR-CON) 20 MEQ tablet Take 2 tablets (40 mEq total) by mouth 2 (two) times daily. Qty: 120 tablet, Refills: 0      CONTINUE these medications which have CHANGED   Details  furosemide (LASIX) 80 MG tablet Take 1 tablet (80 mg total) by mouth 2 (two) times daily. Qty: 60 tablet, Refills: 0      CONTINUE these medications which have NOT CHANGED   Details  albuterol (PROVENTIL HFA;VENTOLIN HFA) 108 (90 BASE) MCG/ACT inhaler Inhale 1-2 puffs into the lungs every 6 (six) hours as needed for wheezing or shortness of breath.    aspirin 81 MG chewable tablet Chew 1 tablet (81 mg total) by mouth daily. Qty: 30 tablet, Refills: 3    carvedilol (COREG) 6.25 MG tablet Take 1 tablet (6.25 mg total) by mouth 2 (two) times daily with a meal. Qty: 60 tablet, Refills: 3    gabapentin (NEURONTIN) 300 MG capsule Take 1 capsule (300 mg total) by mouth 3 (three) times daily. Qty: 90 capsule, Refills: 3    lisinopril (PRINIVIL,ZESTRIL) 5 MG tablet Take 1 tablet (5 mg total) by mouth daily. Qty: 90 tablet, Refills: 3      STOP taking these medications     metFORMIN (GLUCOPHAGE) 500 MG tablet      guaiFENesin (MUCINEX) 600 MG 12 hr tablet        No Known Allergies Follow-up Information    Follow up with Lars Masson, MD On 12/14/2014.   Specialty:  Cardiology   Why:  8:15am   Contact information:   [address] [place] [address] [phone]       Please follow up.   Why:  PCP 1 week for BMP       The results of significant diagnostics from this hospitalization (including imaging, microbiology, ancillary and laboratory) are listed below for reference.    Significant Diagnostic Studies: Dg Chest 2 View  11/21/2014   CLINICAL DATA:  Follow-up bilateral pleural effusions.  EXAM: CHEST  2 VIEW  COMPARISON:  11/19/2014, 08/12/2014.  FINDINGS: Moderate-sized bilateral pleural effusions, slightly decreased in size since the  examination 2 days ago. Improved aeration in the right lower lobe. Stable dense consolidation in the left lower lobe. No new pulmonary parenchymal abnormalities. Cardiac silhouette moderately enlarged but stable. Mild pulmonary venous hypertension without overt edema. Degenerative changes and DISH involving the thoracic spine.  IMPRESSION: 1. Moderate-sized bilateral pleural effusions, slightly decreased in size since the examination 2 days ago. 2. Improved aeration in the right lower lobe. Stable dense passive atelectasis and/or pneumonia in the left lower lobe. 3. No new abnormalities.   Electronically Signed   By: Hulan Saas M.D.   On: 11/21/2014 10:56   Dg Chest 2 View  11/19/2014   CLINICAL DATA:  Two day history of productive cough and shortness of breath. Current history of hypertension.  Prior episodes of CHF.  EXAM: CHEST  2 VIEW  COMPARISON:  Two-view chest x-ray and CTA chest 08/12/2014.  FINDINGS: Cardiac silhouette moderately enlarged for the AP semi-erect technique, unchanged. Pulmonary vascularity normal without evidence of pulmonary edema currently. Large bilateral pleural effusions, left greater than right, with associated dense consolidation in the lower lobes. Fluid in the major fissure on the right. Upper lobes remain clear. Degenerative changes throughout the thoracic and visualized lumbar spine and in the right acromioclavicular joint.  IMPRESSION: Stable moderate cardiomegaly without evidence of pulmonary edema currently. Large bilateral pleural effusions, left greater than right, with associated dense passive atelectasis versus pneumonia in the lower lobes.   Electronically Signed   By: Hulan Saas M.D.   On: 11/19/2014 21:01   Dg Foot 2 Views Right  11/20/2014   CLINICAL DATA:  Foot ulcer over medial malleolus.  EXAM: RIGHT FOOT - 2 VIEW  COMPARISON:  None.  FINDINGS: Skin thickening and probable ulcer about the medial ankle. There is a questionable adjacent periosteal  reaction of the medial malleolus. This would be better assessed on dedicated ankle series. There is mild hallux valgus and degenerative change in the first metatarsal phalangeal joint. No acute fracture. Moderate plantar calcaneal spur and small Achilles tendon enthesophyte. There is an os navicular. No findings in the foot to suggest osteomyelitis.  IMPRESSION: Soft tissue ulcer about the medial ankle. There is questionable subjacent periosteal reaction about the medial malleolus. This could be better assessed on dedicated three-view ankle radiographs. There is no evidence of osteomyelitis of foot.  Mild hallux valgus and degenerative change of the first metatarsal phalangeal joint.   Electronically Signed   By: Rubye Oaks M.D.   On: 11/20/2014 02:20    Microbiology: Recent Results (from the past 240 hour(s))  Culture, Urine     Status: None   Collection Time: 11/20/14  3:48 AM  Result Value Ref Range Status   Specimen Description URINE, CLEAN CATCH  Final   Special Requests NONE  Final   Colony Count NO GROWTH Performed at [facility]   Final   Culture NO GROWTH Performed at [facility]   Final   Report Status 11/21/2014 FINAL  Final     Labs: Basic Metabolic Panel:  Recent Labs Lab 11/19/14 2303 11/20/14 0420 11/21/14 0434 11/22/14 0320 11/23/14 0302  NA 136 137 136 135 134*  K 4.1 3.8 3.3* 3.7 3.8  CL 105 101 98 96 94*  CO2 28 31 34* 35* 32  GLUCOSE 145* 127* 160* 183* 102*  BUN CREATININE 1.08 1.20 1.25 1.40* 1.10  CALCIUM 8.4 8.9 8.3* 8.4 8.8   Liver Function Tests:  Recent Labs Lab 11/19/14 2303 11/20/14 0420  AST 27 30  ALT 21 23  ALKPHOS 154* 164*  BILITOT 0.8 0.8  PROT 6.9 7.5  ALBUMIN 3.3* 3.4*   No results for input(s): LIPASE, AMYLASE in the last 168 hours. No results for input(s): AMMONIA in the last 168 hours. CBC:  Recent Labs Lab 11/19/14 2303 11/20/14 0420 11/21/14 0434  WBC 5.9 5.9 5.4  NEUTROABS  3.9 3.4  --   HGB 11.4* 12.0* 11.0*  HCT 35.3* 36.7* 33.6*  MCV 93.6 93.4 90.1  PLT 188 210 204   Cardiac Enzymes:  Recent Labs Lab 11/19/14 2303 11/20/14 0420 11/20/14 0958 11/20/14 1530 11/21/14 0434  TROPONINI 1.17* 1.35* 1.98* 1.37* 1.62*   BNP: BNP (last 3 results)  Recent Labs  08/12/14 1756  PROBNP 7427.0*   CBG:  Recent Labs Lab 11/21/14 1145 11/21/14 1623 11/22/14 1104 11/22/14 2119 11/23/14 0606  GLUCAP 250* 88 158* 164* 106*       Signed:  Elania Crowl  [facility] 11/23/2014, 8:22 AM

## 2014-11-23 NOTE — Evaluation (Signed)
Physical Therapy Evaluation Patient Details Name: Russell Hamilton MRN: [id number] DOB: 04/02/1941 Today's Date: 11/23/2014   History of Present Illness  Pt adm with acute systolic heart failure. PMH - CVA, HTN, DM, peripheral neuropathy.  Clinical Impression  Pt mobilizing adequately with cane. Pt reports bil leg pain with amb which pt reports happens if he hasn't walked. Pt stated no one has walked with him. Nursing note yesterday written when pt refused amb when offered.    Follow Up Recommendations No PT follow up    Equipment Recommendations  None recommended by PT    Recommendations for Other Services       Precautions / Restrictions Precautions Precautions: None      Mobility  Bed Mobility                  Transfers Overall transfer level: Modified independent Equipment used: Straight cane                Ambulation/Gait Ambulation/Gait assistance: Modified independent (Device/Increase time) Ambulation Distance (Feet): 200 Feet Assistive device: Straight cane Gait Pattern/deviations: Step-through pattern;Decreased step length - right;Decreased step length - left   Gait velocity interpretation: Below normal speed for age/gender General Gait Details: Pt took 3 standing breaks due to pain in legs.  Stairs            Wheelchair Mobility    Modified Rankin (Stroke Patients Only)       Balance Overall balance assessment: Needs assistance Sitting-balance support: No upper extremity supported;Feet supported Sitting balance-Leahy Scale: Normal     Standing balance support: No upper extremity supported Standing balance-Leahy Scale: Good                               Pertinent Vitals/Pain Pain Assessment: 0-10 Pain Score: 8  Pain Location: bil legs Pain Intervention(s): Limited activity within patient's tolerance;Monitored during session;Repositioned    Home Living Family/patient expects to be discharged to:: Private  residence Living Arrangements: Alone Available Help at Discharge: Family;Available PRN/intermittently Type of Home: House Home Access: Stairs to enter   [employer] Corporation[employer]trance Stairs-Number of Steps: 12 Home Layout: One level Home Equipment: Cane - single point      Prior Function Level of Independence: Independent with assistive device(s)               Hand Dominance   Dominant Hand: Right    Extremity/Trunk Assessment   Upper Extremity Assessment: Overall WFL for tasks assessed           Lower Extremity Assessment: Overall WFL for tasks assessed         Communication   Communication: No difficulties  Cognition Arousal/Alertness: Awake/alert Behavior During Therapy: WFL for tasks assessed/performed Overall Cognitive Status: Within Functional Limits for tasks assessed                      General Comments      Exercises        Assessment/Plan    PT Assessment Patent does not need any further PT services  PT Diagnosis Difficulty walking   PT Problem List    PT Treatment Interventions     PT Goals (Current goals can be found in the Care Plan section) Acute Rehab PT Goals PT Goal Formulation: All assessment and education complete, DC therapy    Frequency     Barriers to discharge        Co-evaluation  End of Session   Activity Tolerance: Patient limited by pain Patient left: in bed Nurse Communication: Mobility status         Time: 1145-1200 PT Time Calculation (min) (ACUTE ONLY): 15 min   Charges:   PT Evaluation $Initial PT Evaluation Tier I: 1 Procedure     PT G Codes:        Bernarda Erck 12-14-14, 2:00 PM  [facility] PT [phone]

## 2014-11-23 NOTE — Progress Notes (Signed)
Pt has orders to discharge home. Discharge instructions completed. Medication list reviewed. Scripts given.

## 2014-11-28 ENCOUNTER — Emergency Department (HOSPITAL_COMMUNITY): Payer: Medicare (Managed Care)

## 2014-11-28 ENCOUNTER — Encounter (HOSPITAL_COMMUNITY): Payer: Self-pay | Admitting: *Deleted

## 2014-11-28 ENCOUNTER — Emergency Department (HOSPITAL_COMMUNITY)
Admission: EM | Admit: 2014-11-28 | Discharge: 2014-11-28 | Disposition: A | Discharge status: Home / Self Care | Payer: Medicare (Managed Care) | Attending: Emergency Medicine | Admitting: Emergency Medicine

## 2014-11-28 DIAGNOSIS — L02612 Cutaneous abscess of left foot: Secondary | ICD-10-CM | POA: Insufficient documentation

## 2014-11-28 DIAGNOSIS — E119 Type 2 diabetes mellitus without complications: Secondary | ICD-10-CM | POA: Insufficient documentation

## 2014-11-28 DIAGNOSIS — I10 Essential (primary) hypertension: Secondary | ICD-10-CM | POA: Diagnosis not present

## 2014-11-28 DIAGNOSIS — Z79899 Other long term (current) drug therapy: Secondary | ICD-10-CM | POA: Diagnosis not present

## 2014-11-28 DIAGNOSIS — IMO0002 Reserved for concepts with insufficient information to code with codable children: Secondary | ICD-10-CM

## 2014-11-28 DIAGNOSIS — Z8669 Personal history of other diseases of the nervous system and sense organs: Secondary | ICD-10-CM | POA: Diagnosis not present

## 2014-11-28 DIAGNOSIS — S90822A Blister (nonthermal), left foot, initial encounter: Secondary | ICD-10-CM

## 2014-11-28 DIAGNOSIS — Z7982 Long term (current) use of aspirin: Secondary | ICD-10-CM | POA: Insufficient documentation

## 2014-11-28 DIAGNOSIS — I509 Heart failure, unspecified: Secondary | ICD-10-CM | POA: Insufficient documentation

## 2014-11-28 DIAGNOSIS — Z720 Tobacco use: Secondary | ICD-10-CM | POA: Diagnosis not present

## 2014-11-28 DIAGNOSIS — L988 Other specified disorders of the skin and subcutaneous tissue: Secondary | ICD-10-CM | POA: Insufficient documentation

## 2014-11-28 DIAGNOSIS — Z8673 Personal history of transient ischemic attack (TIA), and cerebral infarction without residual deficits: Secondary | ICD-10-CM | POA: Insufficient documentation

## 2014-11-28 DIAGNOSIS — M79672 Pain in left foot: Secondary | ICD-10-CM | POA: Diagnosis present

## 2014-11-28 DIAGNOSIS — L989 Disorder of the skin and subcutaneous tissue, unspecified: Secondary | ICD-10-CM

## 2014-11-28 LAB — CBC
HCT: 35.4 % — ABNORMAL LOW (ref 39.0–52.0)
Hemoglobin: 11.7 g/dL — ABNORMAL LOW (ref 13.0–17.0)
MCH: 30.1 pg (ref 26.0–34.0)
MCHC: 33.1 g/dL (ref 30.0–36.0)
MCV: 91 fL (ref 78.0–100.0)
PLATELETS: 224 10*3/uL (ref 150–400)
RBC: 3.89 MIL/uL — AB (ref 4.22–5.81)
RDW: 14.4 % (ref 11.5–15.5)
WBC: 5.1 10*3/uL (ref 4.0–10.5)

## 2014-11-28 LAB — BASIC METABOLIC PANEL
ANION GAP: 10 (ref 5–15)
BUN: 8 mg/dL (ref 6–23)
CO2: 28 mmol/L (ref 19–32)
Calcium: 8.6 mg/dL (ref 8.4–10.5)
Chloride: 96 mmol/L (ref 96–112)
Creatinine, Ser: 0.85 mg/dL (ref 0.50–1.35)
GFR calc Af Amer: >90 mL/min (ref >90)
GFR calc non Af Amer: 86 mL/min — ABNORMAL LOW (ref >90)
Glucose, Bld: 283 mg/dL — ABNORMAL HIGH (ref 70–99)
POTASSIUM: 4 mmol/L (ref 3.5–5.1)
SODIUM: 134 mmol/L — AB (ref 135–145)

## 2014-11-28 MED ORDER — MORPHINE SULFATE 4 MG/ML IJ SOLN
4.0000 mg | Freq: Once | INTRAMUSCULAR | Status: AC
Start: 2014-11-28 — End: 2014-11-28
  Administered 2014-11-28: 4 mg via INTRAVENOUS
  Filled 2014-11-28: qty 1

## 2014-11-28 MED ORDER — CLINDAMYCIN HCL 150 MG PO CAPS: 450.0000 mg | ORAL_CAPSULE | Freq: Three times a day (TID) | ORAL | Status: DC

## 2014-11-28 MED ORDER — CLINDAMYCIN HCL 300 MG PO CAPS
450.0000 mg | ORAL_CAPSULE | Freq: Once | ORAL | Status: AC
  Administered 2014-11-28: 450 mg via ORAL
  Filled 2014-11-28: qty 1

## 2014-11-28 NOTE — ED Provider Notes (Signed)
CSN: [id number]     Arrival date & time 11/28/14  1653 History   First MD Initiated Contact with Patient 11/28/14 1916     Chief Complaint  Patient presents with  . Foot Pain     (Consider location/radiation/quality/duration/timing/severity/associated sxs/prior Treatment) HPI Comments: The patient is a 74 year old male with past mental history of diabetes, peripheral neuropathy, heart failure, CVA presenting emergency complaint of left foot pain and swelling since yesterday. The patient reports he sustained ulcerations to bilateral medial malleoli after "knocking his ankles together"  Several weeks ago. He reports pain and swelling have improved. He reports he was supposed to have his dressings changed from a home health nurse, both wounds have not been dressed since 11/23/2014 when he was discharged from the hospital. The patient reports blisters appearing yesterday with some drainage and pain at the fifth toe.  He denies known injury. He denies fever, chills, nausea, vomiting. He reports dyspnea resolved denies chest pain. Patient reports recently admitted to the hospital for heart failure. NO PCP  The history is provided by the patient. No language interpreter was used.    Past Medical History  Diagnosis Date  . Stroke 2010    "memory problems since"  . Hypertension   . Type II diabetes mellitus   . Chronic systolic CHF (congestive heart failure)     a. 2D ECHO 08/13/14: EF 40%, diffuse hypokinesis possibly worse in the inferior wall. Mild MR, mild LA dilation, PA pressure 50. Trivial pericardial effusion.  . Acute systolic CHF (congestive heart failure), NYHA class 4 08/12/2014   Past Surgical History  Procedure Laterality Date  . Carotid endarterectomy Right 2010   Family History  Problem Relation Age of Onset  . Kidney disease Mother    History  Substance Use Topics  . Smoking status: Current Every Day Smoker -- 0.50 packs/day for 56 years    Types: Cigarettes  . Smokeless  tobacco: Never Used  . Alcohol Use: No     Comment: "quit drinking in 1976    Review of Systems  Constitutional: Negative for fever and chills.  Respiratory: Negative for shortness of breath.   Cardiovascular: Negative for chest pain.  Gastrointestinal: Negative for nausea and vomiting.  Musculoskeletal: Positive for gait problem.  Skin: Positive for wound.      Allergies  Review of patient's allergies indicates no known allergies.  Home Medications   Prior to Admission medications   Medication Sig Start Date End Date Taking? Authorizing Provider  albuterol (PROVENTIL HFA;VENTOLIN HFA) 108 (90 BASE) MCG/ACT inhaler Inhale 2 puffs into the lungs every 6 (six) hours as needed for wheezing or shortness of breath.   Yes Historical Provider, MD  aspirin 81 MG chewable tablet Chew 1 tablet (81 mg total) by mouth daily. 08/14/14  Yes Richarda Overlie, MD  carvedilol (COREG) 6.25 MG tablet Take 1 tablet (6.25 mg total) by mouth 2 (two) times daily with a meal. 08/14/14  Yes Richarda Overlie, MD  gabapentin (NEURONTIN) 300 MG capsule Take 1 capsule (300 mg total) by mouth 3 (three) times daily. 08/14/14  Yes Richarda Overlie, MD  lisinopril (PRINIVIL,ZESTRIL) 5 MG tablet Take 1 tablet (5 mg total) by mouth daily. 09/01/14  Yes Lars Masson, MD  collagenase (SANTYL) ointment Apply topically daily. Apply for 21 days Patient not taking: Reported on 11/28/2014 11/23/14   Joseph Art, DO  feeding supplement, ENSURE COMPLETE, (ENSURE COMPLETE) LIQD Take 237 mLs by mouth 2 (two) times daily between meals. Patient not  taking: Reported on 11/28/2014 11/23/14   Joseph ArtJessica U Vann, DO  furosemide (LASIX) 40 MG tablet Take 40 mg by mouth daily.    Historical Provider, MD  furosemide (LASIX) 80 MG tablet Take 1 tablet (80 mg total) by mouth 2 (two) times daily. Patient not taking: Reported on 11/28/2014 11/23/14   Joseph ArtJessica U Vann, DO  glimepiride (AMARYL) 2 MG tablet Take 1 tablet (2 mg total) by mouth daily with  breakfast. Patient not taking: Reported on 11/28/2014 11/23/14   Joseph ArtJessica U Vann, DO  HYDROcodone-acetaminophen (NORCO/VICODIN) 5-325 MG per tablet Take 1 tablet by mouth every 6 (six) hours as needed for severe pain. Patient not taking: Reported on 11/28/2014 11/23/14   Joseph ArtJessica U Vann, DO  metFORMIN (GLUCOPHAGE) 500 MG tablet Take 500 mg by mouth 2 (two) times daily with a meal.    Historical Provider, MD  potassium chloride SA (K-DUR,KLOR-CON) 20 MEQ tablet Take 2 tablets (40 mEq total) by mouth 2 (two) times daily. Patient not taking: Reported on 11/28/2014 11/23/14   Selinda OrionJessica U Vann, DO   BP 161/90 mmHg  Pulse 108  Temp(Src) 97.9 F (36.6 C) (Oral)  Resp 28  Ht 5\' 9"  (1.753 m)  Wt 150 lb (68.04 kg)  BMI 22.14 kg/m2  SpO2 96% Physical Exam  Constitutional: He is oriented to person, place, and time. He appears well-developed and well-nourished. No distress.  HENT:  Head: Normocephalic and atraumatic.  Neck: Neck supple.  Cardiovascular: Normal rate and regular rhythm.   Pulmonary/Chest: Effort normal and breath sounds normal. No respiratory distress. He has no wheezes. He has no rales.  Abdominal: Soft. There is no tenderness. There is no rebound.  Musculoskeletal: Normal range of motion.       Feet:  Left foot: Large serosanguinous intact blister to lateral aspect of foot.  Circular bloody/serosanginous intact blister to lateral aspect of heel.  No erythema or streaking up leg Bilateral medial ankles healing ulcers to medial malleolus  without induration or purulent drainage. No surrounding erythema.  Neurological: He is alert and oriented to person, place, and time.  Skin: Skin is warm.  Psychiatric: He has a normal mood and affect. His behavior is normal.  Nursing note and vitals reviewed.   ED Course  Procedures (including critical care time)      Labs Review Labs Reviewed  CBC - Abnormal; Notable for the following:    RBC 3.89 (*)    Hemoglobin 11.7 (*)    HCT 35.4  (*)    All other components within normal limits  BASIC METABOLIC PANEL - Abnormal; Notable for the following:    Sodium 134 (*)    Glucose, Bld 283 (*)    GFR calc non Af Amer 86 (*)    All other components within normal limits    Imaging Review No results found.   EKG Interpretation None      MDM   Final diagnoses:  Wound abscess  Blister of foot, left, initial encounter  Foot lesion   Patient with large intact blisters to left foot, and mild swelling and redness to the left fifth digit. Afebrile, no leukocytosis. No red streaking up the foot or leg. Discussed patient history, condition with Dr. Park Popeockerty who also evaluated the patient. X-ray negative for sign of osteo. Plan to treat for superficial infection, follow up with wound care as previously instructed. And [facility] already scheduled.  Meds given in ED:  Medications  morphine 4 MG/ML injection 4 mg (4 mg Intravenous Given 11/28/14 2019)  clindamycin (CLEOCIN) capsule 450 mg (450 mg Oral Given 11/28/14 2134)    Discharge Medication List as of 11/28/2014  9:56 PM    START taking these medications   Details  clindamycin (CLEOCIN) 150 MG capsule Take 3 capsules (450 mg total) by mouth 3 (three) times daily., Starting 11/28/2014, Until Discontinued, Print          Mellody Drown, PA-C 11/29/14 1610  Toy Cookey, MD 11/29/14 2201

## 2014-11-28 NOTE — Discharge Instructions (Signed)
Call for a follow up appointment with a Family or Primary Care Provider.  Keep your appointment for January 27th at 12:00PM as previously scheduled. Return if Symptoms worsen.   Take medication as prescribed.  Keep blisters intact, do not pop them. Take medications as previously prescribed, and the new antibiotic for your foot lesion.

## 2014-11-28 NOTE — ED Notes (Signed)
Pt reports wound to left foot, now has large blister to lateral left foot. Denies fever.

## 2014-11-29 NOTE — Care Management Note (Signed)
Received a call from the Office of Patient Experience this morning that [name] was confused about his home care. When he discharged last week, there was an attempt to set him up with home care for dressing changes on bilat ankles. The home care liaison and the RN CM met with him in his room to advise that he and/or a caregiver would be trained in the dressing changes as the agency would not be out daily. He adamantly refused this option. He also advised that the niece who would be most capable of helping has five children and is too busy to assist. Home care was not set up. I had to leave a message with [name]- when he called back he indicated that he had watched the MD in the ED last evening and was comfortable doing it if he could get the supplies at the [facility] near his home as he does not drive. I advised that he can get the supplies where it is most convenient for him.  I asked him if he had his AVS to confirm that he has two appts on Wednesday the 27th- one at the [facility] and [facility]- he is aware and led me to believe that he will make these appts. He disagrees that he has an appt on Feb 9th at [facility]- I asked that he call them to clear that up. He appreciated the follow-up.

## 2014-12-01 ENCOUNTER — Encounter (HOSPITAL_BASED_OUTPATIENT_CLINIC_OR_DEPARTMENT_OTHER): Payer: Medicare (Managed Care) | Attending: Surgery

## 2014-12-01 ENCOUNTER — Inpatient Hospital Stay: Payer: Medicare (Managed Care) | Admitting: Internal Medicine

## 2014-12-11 ENCOUNTER — Encounter (HOSPITAL_COMMUNITY): Payer: Self-pay | Admitting: Physical Medicine and Rehabilitation

## 2014-12-11 ENCOUNTER — Inpatient Hospital Stay (HOSPITAL_COMMUNITY)
Admission: EM | Admit: 2014-12-11 | Discharge: 2014-12-14 | DRG: 291 | Disposition: A | Discharge status: Home / Self Care | Payer: Medicare (Managed Care) | Attending: Internal Medicine | Admitting: Internal Medicine

## 2014-12-11 ENCOUNTER — Other Ambulatory Visit: Payer: Self-pay

## 2014-12-11 ENCOUNTER — Emergency Department (HOSPITAL_COMMUNITY): Payer: Medicare (Managed Care)

## 2014-12-11 DIAGNOSIS — IMO0002 Reserved for concepts with insufficient information to code with codable children: Secondary | ICD-10-CM

## 2014-12-11 DIAGNOSIS — I10 Essential (primary) hypertension: Secondary | ICD-10-CM | POA: Diagnosis present

## 2014-12-11 DIAGNOSIS — E1165 Type 2 diabetes mellitus with hyperglycemia: Secondary | ICD-10-CM

## 2014-12-11 DIAGNOSIS — I5023 Acute on chronic systolic (congestive) heart failure: Secondary | ICD-10-CM | POA: Diagnosis present

## 2014-12-11 DIAGNOSIS — E119 Type 2 diabetes mellitus without complications: Secondary | ICD-10-CM | POA: Diagnosis present

## 2014-12-11 DIAGNOSIS — Z7951 Long term (current) use of inhaled steroids: Secondary | ICD-10-CM | POA: Diagnosis not present

## 2014-12-11 DIAGNOSIS — I509 Heart failure, unspecified: Secondary | ICD-10-CM | POA: Diagnosis not present

## 2014-12-11 DIAGNOSIS — Z8673 Personal history of transient ischemic attack (TIA), and cerebral infarction without residual deficits: Secondary | ICD-10-CM | POA: Diagnosis not present

## 2014-12-11 DIAGNOSIS — L97319 Non-pressure chronic ulcer of right ankle with unspecified severity: Secondary | ICD-10-CM | POA: Diagnosis present

## 2014-12-11 DIAGNOSIS — Z7982 Long term (current) use of aspirin: Secondary | ICD-10-CM | POA: Diagnosis not present

## 2014-12-11 DIAGNOSIS — E43 Unspecified severe protein-calorie malnutrition: Secondary | ICD-10-CM | POA: Diagnosis present

## 2014-12-11 DIAGNOSIS — J9601 Acute respiratory failure with hypoxia: Secondary | ICD-10-CM | POA: Diagnosis present

## 2014-12-11 DIAGNOSIS — Z79899 Other long term (current) drug therapy: Secondary | ICD-10-CM | POA: Diagnosis not present

## 2014-12-11 DIAGNOSIS — F17210 Nicotine dependence, cigarettes, uncomplicated: Secondary | ICD-10-CM | POA: Diagnosis present

## 2014-12-11 DIAGNOSIS — E1129 Type 2 diabetes mellitus with other diabetic kidney complication: Secondary | ICD-10-CM

## 2014-12-11 DIAGNOSIS — I5021 Acute systolic (congestive) heart failure: Secondary | ICD-10-CM | POA: Diagnosis present

## 2014-12-11 DIAGNOSIS — E1140 Type 2 diabetes mellitus with diabetic neuropathy, unspecified: Secondary | ICD-10-CM

## 2014-12-11 LAB — CBC WITH DIFFERENTIAL/PLATELET
Basophils Absolute: 0 10*3/uL (ref 0.0–0.1)
Basophils Relative: 0 % (ref 0–1)
Eosinophils Absolute: 0 10*3/uL (ref 0.0–0.7)
Eosinophils Relative: 1 % (ref 0–5)
HCT: 38.8 % — ABNORMAL LOW (ref 39.0–52.0)
HEMOGLOBIN: 13 g/dL (ref 13.0–17.0)
LYMPHS ABS: 1.4 10*3/uL (ref 0.7–4.0)
LYMPHS PCT: 22 % (ref 12–46)
MCH: 30.2 pg (ref 26.0–34.0)
MCHC: 33.5 g/dL (ref 30.0–36.0)
MCV: 90 fL (ref 78.0–100.0)
MONO ABS: 0.5 10*3/uL (ref 0.1–1.0)
Monocytes Relative: 8 % (ref 3–12)
NEUTROS ABS: 4.4 10*3/uL (ref 1.7–7.7)
NEUTROS PCT: 69 % (ref 43–77)
Platelets: 250 10*3/uL (ref 150–400)
RBC: 4.31 MIL/uL (ref 4.22–5.81)
RDW: 14.6 % (ref 11.5–15.5)
WBC: 6.3 10*3/uL (ref 4.0–10.5)

## 2014-12-11 LAB — I-STAT TROPONIN, ED: Troponin i, poc: 0.02 ng/mL (ref 0.00–0.08)

## 2014-12-11 LAB — COMPREHENSIVE METABOLIC PANEL
ALT: 11 U/L (ref 0–53)
AST: 17 U/L (ref 0–37)
Albumin: 3.4 g/dL — ABNORMAL LOW (ref 3.5–5.2)
Alkaline Phosphatase: 140 U/L — ABNORMAL HIGH (ref 39–117)
Anion gap: 10 (ref 5–15)
BUN: 9 mg/dL (ref 6–23)
CHLORIDE: 100 mmol/L (ref 96–112)
CO2: 26 mmol/L (ref 19–32)
CREATININE: 1.05 mg/dL (ref 0.50–1.35)
Calcium: 9.2 mg/dL (ref 8.4–10.5)
GFR calc Af Amer: 80 mL/min — ABNORMAL LOW (ref >90)
GFR calc non Af Amer: 69 mL/min — ABNORMAL LOW (ref >90)
GLUCOSE: 150 mg/dL — AB (ref 70–99)
POTASSIUM: 4.2 mmol/L (ref 3.5–5.1)
Sodium: 136 mmol/L (ref 135–145)
Total Bilirubin: 1 mg/dL (ref 0.3–1.2)
Total Protein: 7.6 g/dL (ref 6.0–8.3)

## 2014-12-11 LAB — I-STAT ARTERIAL BLOOD GAS, ED
ACID-BASE DEFICIT: 2 mmol/L (ref 0.0–2.0)
Bicarbonate: 20.8 mEq/L (ref 20.0–24.0)
O2 Saturation: 93 %
Patient temperature: 98.6
TCO2: 22 mmol/L (ref 0–100)
pCO2 arterial: 30.2 mmHg — ABNORMAL LOW (ref 35.0–45.0)
pH, Arterial: 7.446 (ref 7.350–7.450)
pO2, Arterial: 63 mmHg — ABNORMAL LOW (ref 80.0–100.0)

## 2014-12-11 LAB — BRAIN NATRIURETIC PEPTIDE: B NATRIURETIC PEPTIDE 5: 1884.5 pg/mL — AB (ref 0.0–100.0)

## 2014-12-11 LAB — TROPONIN I: Troponin I: 0.03 ng/mL (ref <0.031)

## 2014-12-11 MED ORDER — NITROGLYCERIN 2 % TD OINT
1.0000 [in_us] | TOPICAL_OINTMENT | Freq: Once | TRANSDERMAL | Status: AC
  Administered 2014-12-11: 1 [in_us] via TOPICAL
  Filled 2014-12-11: qty 1

## 2014-12-11 MED ORDER — ASPIRIN 81 MG PO CHEW
81.0000 mg | CHEWABLE_TABLET | Freq: Once | ORAL | Status: AC
  Administered 2014-12-11: 81 mg via ORAL
  Filled 2014-12-11: qty 1

## 2014-12-11 MED ORDER — OXYCODONE-ACETAMINOPHEN 5-325 MG PO TABS
1.0000 | ORAL_TABLET | Freq: Once | ORAL | Status: AC
  Administered 2014-12-11: 1 via ORAL
  Filled 2014-12-11: qty 1

## 2014-12-11 MED ORDER — FUROSEMIDE 10 MG/ML IJ SOLN
40.0000 mg | Freq: Once | INTRAMUSCULAR | Status: AC
  Administered 2014-12-11: 40 mg via INTRAVENOUS
  Filled 2014-12-11: qty 4

## 2014-12-11 NOTE — ED Notes (Signed)
Respiratory notified patient has returned for ABG.

## 2014-12-11 NOTE — ED Notes (Signed)
Patient transported to X-ray 

## 2014-12-11 NOTE — ED Provider Notes (Signed)
CSN: [id number]     Arrival date & time 12/11/14  1856 History   First MD Initiated Contact with Patient 12/11/14 1938     Chief Complaint  Patient presents with  . Shortness of Breath     (Consider location/radiation/quality/duration/timing/severity/associated sxs/prior Treatment) HPI The patient reports that he became very quickly short of breath about 2 hours ago. He denies any associated chest pain. He reports he's had chronic swelling in his legs. Worse that he has been treated for congestive heart failure in the hospital within the last month. He denies that he was discharged on any diuretic type medications. The patient denies any pain in his legs or his calves. He reports he does continue to smoke and was smoking today. He denies he's had fever or productive cough leading up to the symptoms. Past Medical History  Diagnosis Date  . Stroke 2010    "memory problems since"  . Hypertension   . Type II diabetes mellitus   . Chronic systolic CHF (congestive heart failure)     a. 2D ECHO 08/13/14: EF 40%, diffuse hypokinesis possibly worse in the inferior wall. Mild MR, mild LA dilation, PA pressure 50. Trivial pericardial effusion.  . Acute systolic CHF (congestive heart failure), NYHA class 4 08/12/2014   Past Surgical History  Procedure Laterality Date  . Carotid endarterectomy Right 2010   Family History  Problem Relation Age of Onset  . Kidney disease Mother    History  Substance Use Topics  . Smoking status: Current Every Day Smoker -- 0.50 packs/day for 56 years    Types: Cigarettes  . Smokeless tobacco: Never Used  . Alcohol Use: No     Comment: "quit drinking in 1976    Review of Systems 10 Systems reviewed and are negative for acute change except as noted in the HPI.    Allergies  Review of patient's allergies indicates no known allergies.  Home Medications   Prior to Admission medications   Medication Sig Start Date End Date Taking? Authorizing Provider   aspirin 81 MG chewable tablet Chew 1 tablet (81 mg total) by mouth daily. 08/14/14  Yes Richarda OverlieNayana Abrol, MD  carvedilol (COREG) 6.25 MG tablet Take 1 tablet (6.25 mg total) by mouth 2 (two) times daily with a meal. 08/14/14  Yes Richarda OverlieNayana Abrol, MD  clindamycin (CLEOCIN) 150 MG capsule Take 3 capsules (450 mg total) by mouth 3 (three) times daily. 11/28/14  Yes Mellody DrownLauren Parker, PA-C  furosemide (LASIX) 40 MG tablet Take 40 mg by mouth daily.   Yes Historical Provider, MD  gabapentin (NEURONTIN) 300 MG capsule Take 1 capsule (300 mg total) by mouth 3 (three) times daily. 08/14/14  Yes Richarda OverlieNayana Abrol, MD  glimepiride (AMARYL) 2 MG tablet Take 1 tablet (2 mg total) by mouth daily with breakfast. 11/23/14  Yes Joseph ArtJessica U Vann, DO  HYDROcodone-acetaminophen (NORCO/VICODIN) 5-325 MG per tablet Take 1 tablet by mouth every 6 (six) hours as needed for severe pain. 11/23/14  Yes Joseph ArtJessica U Vann, DO  lactose free nutrition (BOOST) LIQD Take 237 mLs by mouth as needed (occasionally).   Yes Historical Provider, MD  lisinopril (PRINIVIL,ZESTRIL) 5 MG tablet Take 1 tablet (5 mg total) by mouth daily. 09/01/14  Yes Lars MassonKatarina H Nelson, MD  potassium chloride SA (K-DUR,KLOR-CON) 20 MEQ tablet Take 2 tablets (40 mEq total) by mouth 2 (two) times daily. 11/23/14  Yes Joseph ArtJessica U Vann, DO  albuterol (PROVENTIL HFA;VENTOLIN HFA) 108 (90 BASE) MCG/ACT inhaler Inhale 2 puffs into the lungs  every 6 (six) hours as needed for wheezing or shortness of breath.    Historical Provider, MD  collagenase (SANTYL) ointment Apply topically daily. Apply for 21 days Patient not taking: Reported on 11/28/2014 11/23/14   Shanda Bumps U Vann, DO   BP 152/92 mmHg  Pulse 100  Temp(Src) 97.4 F (36.3 C) (Oral)  Resp 41  SpO2 99% Physical Exam  Constitutional: He is oriented to person, place, and time. He appears well-developed and well-nourished.  The patient has moderate respiratory distress but is able to speak in full short sentences. His mental status is  clear. Skin is warm and dry. The patient is nontoxic in appearance.  HENT:  Head: Normocephalic and atraumatic.  Nose: Nose normal.  Mouth/Throat: Oropharynx is clear and moist.  Eyes: EOM are normal. Pupils are equal, round, and reactive to light.  Neck: Neck supple.  Cardiovascular: Normal rate, regular rhythm, normal heart sounds and intact distal pulses.   Pulmonary/Chest: He is in respiratory distress. He has wheezes. He has rales.  Breath sounds are diminished at the bases. There appear to be more localizing rail in the right midlung field.  Abdominal: Soft. Bowel sounds are normal. He exhibits no distension. There is no tenderness.  Musculoskeletal: Normal range of motion. He exhibits no edema.  Patient has 1-2+ pitting edema bilateral lower extremities symmetric. No calf pain.  Neurological: He is alert and oriented to person, place, and time. He has normal strength. Coordination normal. GCS eye subscore is 4. GCS verbal subscore is 5. GCS motor subscore is 6.  Skin: Skin is warm, dry and intact.  Psychiatric: He has a normal mood and affect.    ED Course  Procedures (including critical care time) Labs Review Labs Reviewed  CBC WITH DIFFERENTIAL/PLATELET - Abnormal; Notable for the following:    HCT 38.8 (*)    All other components within normal limits  COMPREHENSIVE METABOLIC PANEL - Abnormal; Notable for the following:    Glucose, Bld 150 (*)    Albumin 3.4 (*)    Alkaline Phosphatase 140 (*)    GFR calc non Af Amer 69 (*)    GFR calc Af Amer 80 (*)    All other components within normal limits  BRAIN NATRIURETIC PEPTIDE - Abnormal; Notable for the following:    B Natriuretic Peptide 1884.5 (*)    All other components within normal limits  I-STAT ARTERIAL BLOOD GAS, ED - Abnormal; Notable for the following:    pCO2 arterial 30.2 (*)    pO2, Arterial 63.0 (*)    All other components within normal limits  TROPONIN I  BLOOD GAS, ARTERIAL  I-STAT TROPOININ, ED     Imaging Review Dg Chest 2 View  12/11/2014   CLINICAL DATA:  Shortness of breath while in the supine position over the past several days. Current history of chronic systolic CHF, diabetes and hypertension.  EXAM: CHEST  2 VIEW  COMPARISON:  11/21/2014 dating back to 08/12/2014. CTA chest 08/12/2014.  FINDINGS: AP semi-erect and lateral images were obtained. Cardiac silhouette moderately to markedly enlarged but stable. 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 Moderately large bilateral pleural effusions with associated dense consolidation in the lower lobes. Degenerative changes throughout the thoracic spine.  IMPRESSION: Stable moderate to marked cardiomegaly. 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 Moderately large bilateral pleural effusions and associated passive atelectasis and/or pneumonia in the lower lobes.   Electronically Signed   By: Hulan Saas M.D.   On: 12/11/2014 20:33  EKG Interpretation None      MDM   Final diagnoses:  Acute exacerbation of CHF (congestive heart failure)   The patient presents with dyspnea of several hours onset. He has peripheral edema and rales on examination. Findings are consistent with CHF exacerbation. Patient reports prior CHF but denies taking a diuretic. There is not associated chest pain. Patient is having a positive response to oxygen, Lasix and Nitropaste. His respiratory status has improved and he subjectively perceived improvement.  Arby Barrette, MD 12/11/14 2350

## 2014-12-11 NOTE — ED Notes (Signed)
Pt presents to department for evaluation of SOB. States he began having difficulty breathing when laying flat. Respirations unlabored. Speaking complete sentences. History of CHF. Pt is alert and oriented x4.

## 2014-12-11 NOTE — H&P (Signed)
[facility] Admission History and Physical       Russell Hamilton ZOX:[id number] DOB: [birth year] DOA: 12/11/2014  Referring physician: EDP PCP: No PCP Per Patient  Specialists:   Chief Complaint:  SOB  HPI: Russell Hamilton is a 74 y.o. male with a history of HTN, Acute Systolic CHF, DM2  who presents to the ED with complaints of increased SOB  2 hours PTA to the ED.   He denies any chest pain or fevers or chills.  He was found to have decreased O2 sats into the 80's.     Review of Systems: Unable to Obtain from the Patient Constitutional: No Weight Loss, No Weight Gain, Night Sweats, Fevers, Chills, Dizziness, Fatigue, or Generalized Weakness HEENT: No Headaches, Difficulty Swallowing,Tooth/Dental Problems,Sore Throat,  No Sneezing, Rhinitis, Ear Ache, Nasal Congestion, or Post Nasal Drip,  Cardio-vascular:  No Chest pain, Orthopnea, PND, Edema in Lower Extremities, Anasarca, Dizziness, Palpitations  Resp: No +Dyspnea, No DOE, No Productive Cough, No Non-Productive Cough, No Hemoptysis, No Wheezing.    GI: No Heartburn, Indigestion, Abdominal Pain, Nausea, Vomiting, Diarrhea, Hematemesis, Hematochezia, Melena, Change in Bowel Habits,  Loss of Appetite  GU:  +Dysuria, Change in Color of Urine, No Urgency or Frequency, No Flank pain.  Musculoskeletal: No Joint Pain or Swelling, No Decreased Range of Motion, No Back Pain.  Neurologic: No Syncope, No Seizures, Muscle Weakness, Paresthesia, Vision Disturbance or Loss, No Diplopia, No Vertigo, No Difficulty Walking,  Skin: No Rash or Lesions. Psych: No Change in Mood or Affect, No Depression or Anxiety, No Memory loss, No Confusion, or Hallucinations    Past Medical History  Diagnosis Date  . Stroke 2010    "memory problems since"  . Hypertension   . Type II diabetes mellitus   . Chronic systolic CHF (congestive heart failure)     a. 2D ECHO 08/13/14: EF 40%, diffuse hypokinesis possibly worse in the inferior wall. Mild MR, mild LA  dilation, PA pressure 50. Trivial pericardial effusion.  . Acute systolic CHF (congestive heart failure), NYHA class 4 08/12/2014     Past Surgical History  Procedure Laterality Date  . Carotid endarterectomy Right 2010      Prior to Admission medications   Medication Sig Start Date End Date Taking? Authorizing Provider  aspirin 81 MG chewable tablet Chew 1 tablet (81 mg total) by mouth daily. 08/14/14  Yes Richarda Overlie, MD  carvedilol (COREG) 6.25 MG tablet Take 1 tablet (6.25 mg total) by mouth 2 (two) times daily with a meal. 08/14/14  Yes Richarda Overlie, MD  clindamycin (CLEOCIN) 150 MG capsule Take 3 capsules (450 mg total) by mouth 3 (three) times daily. 11/28/14  Yes Mellody Drown, PA-C  furosemide (LASIX) 40 MG tablet Take 40 mg by mouth daily.   Yes Historical Provider, MD  gabapentin (NEURONTIN) 300 MG capsule Take 1 capsule (300 mg total) by mouth 3 (three) times daily. 08/14/14  Yes Richarda Overlie, MD  glimepiride (AMARYL) 2 MG tablet Take 1 tablet (2 mg total) by mouth daily with breakfast. 11/23/14  Yes Joseph Art, DO  HYDROcodone-acetaminophen (NORCO/VICODIN) 5-325 MG per tablet Take 1 tablet by mouth every 6 (six) hours as needed for severe pain. 11/23/14  Yes Joseph Art, DO  lactose free nutrition (BOOST) LIQD Take 237 mLs by mouth as needed (occasionally).   Yes Historical Provider, MD  lisinopril (PRINIVIL,ZESTRIL) 5 MG tablet Take 1 tablet (5 mg total) by mouth daily. 09/01/14  Yes Lars Masson, MD  potassium chloride  SA (K-DUR,KLOR-CON) 20 MEQ tablet Take 2 tablets (40 mEq total) by mouth 2 (two) times daily. 11/23/14  Yes Joseph ArtJessica U Vann, DO  albuterol (PROVENTIL HFA;VENTOLIN HFA) 108 (90 BASE) MCG/ACT inhaler Inhale 2 puffs into the lungs every 6 (six) hours as needed for wheezing or shortness of breath.    Historical Provider, MD  collagenase (SANTYL) ointment Apply topically daily. Apply for 21 days Patient not taking: Reported on 11/28/2014 11/23/14   Joseph ArtJessica U  Vann, DO     No Known Allergies  Social History:  reports that he has been smoking Cigarettes.  He has a 28 pack-year smoking history. He has never used smokeless tobacco. He reports that he does not drink alcohol or use illicit drugs.    Family History  Problem Relation Age of Onset  . Kidney disease Mother        Physical Exam:  GEN:  Pleasant Elderly Thin Well Developed   74 y.o. male  examined  and in no acute distress; cooperative with exam Filed Vitals:   12/11/14 2200 12/11/14 2215 12/11/14 2230 12/11/14 2245  BP: 152/99 164/98 115/77 152/92  Pulse: 100 103 103 100  Temp:      TempSrc:      Resp: 26 25 20  41  SpO2: 98% 95% 96% 99%   Blood pressure 152/92, pulse 100, temperature 97.4 F (36.3 C), temperature source Oral, resp. rate 41, SpO2 99 %. PSYCH: He is alert and oriented x4; does not appear anxious does not appear depressed; affect is normal HEENT: Normocephalic and Atraumatic, Mucous membranes pink; PERRLA; EOM intact; Fundi:  Benign;  No scleral icterus, Nares: Patent, Oropharynx: Clear, Poor Dentition,    Neck:  FROM, No Cervical Lymphadenopathy nor Thyromegaly or Carotid Bruit; No JVD; Breasts:: Not examined CHEST WALL: No tenderness CHEST: Normal respiration, clear to auscultation bilaterally HEART: Regular rate and rhythm; no murmurs rubs or gallops BACK: No kyphosis or scoliosis; No CVA tenderness ABDOMEN: Positive Bowel Sounds, Soft Non-Tender; No Masses, No Organomegaly. Rectal Exam: Not done EXTREMITIES: No Cyanosis, Clubbing, 1-2+ Edema BLEs; No Ulcerations. Genitalia: not examined PULSES: 2+ and symmetric SKIN: Normal hydration no rash or ulceration CNS:  Alert and oriented x 4, No Focal Deficits Vascular: pulses palpable throughout    Labs on Admission:  Basic Metabolic Panel:  Recent Labs Lab 12/11/14 1909  NA 136  K 4.2  CL 100  CO2 26  GLUCOSE 150*  BUN 9  CREATININE 1.05  CALCIUM 9.2   Liver Function Tests:  Recent Labs Lab  12/11/14 1909  AST 17  ALT 11  ALKPHOS 140*  BILITOT 1.0  PROT 7.6  ALBUMIN 3.4*   No results for input(s): LIPASE, AMYLASE in the last 168 hours. No results for input(s): AMMONIA in the last 168 hours. CBC:  Recent Labs Lab 12/11/14 1909  WBC 6.3  NEUTROABS 4.4  HGB 13.0  HCT 38.8*  MCV 90.0  PLT 250   Cardiac Enzymes:  Recent Labs Lab 12/11/14 1949  TROPONINI 0.03    BNP (last 3 results)  Recent Labs  11/19/14 2303 11/20/14 0420 12/11/14 1945  BNP 1565.5* 1124.7* 1884.5*    ProBNP (last 3 results)  Recent Labs  08/12/14 1756  PROBNP 7427.0*    CBG: No results for input(s): GLUCAP in the last 168 hours.  Radiological Exams on Admission: Dg Chest 2 View  12/11/2014   CLINICAL DATA:  Shortness of breath while in the supine position over the past several days. Current history of  chronic systolic CHF, diabetes and hypertension.  EXAM: CHEST  2 VIEW  COMPARISON:  11/21/2014 dating back to 08/12/2014. CTA chest 08/12/2014.  FINDINGS: AP semi-erect and lateral images were obtained. Cardiac silhouette moderately to markedly enlarged but stable. 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 Moderately large bilateral pleural effusions with associated dense consolidation in the lower lobes. Degenerative changes throughout the thoracic spine.  IMPRESSION: Stable moderate to marked cardiomegaly. 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 Moderately large bilateral pleural effusions and associated passive atelectasis and/or pneumonia in the lower lobes.   Electronically Signed   By: Hulan Saas M.D.   On: 12/11/2014 20:33     EKG: Independently reviewed. Sinus Tachycardia 111 No Acute Changes    Assessment/Plan:   74 y.o. male with  Principal Problem:   1.   Acute systolic CHF (congestive heart failure)   CHF Protocol   IV Lasix BID   Continue Carvedilol, Lisinopril,  Rx.      [place] O2 PRN   Active Problems:   2.   Acute respiratory  failure with hypoxia   Monitor O2 Sats   NCO2 PRN     3.   HTN (hypertension)   Continue Carvedilol, Lisinopril, and Lasix Rx     4.   Protein-calorie malnutrition, severe   Continue Nutritional Supplements      5.   Type II diabetes mellitus   SSI coverage PRN   Hold Amaryl Rx         6.    DVT Prophylaxis   Lovenox     Code Status:  FULL CODE      Family Communication:    No Family at Bedside Disposition Plan:       Inpatient   Time spent:  56 Minutes  Ron Parker [facility] Pager [phone]   If 7AM -7PM Please Contact the Day Rounding Team MD for [facility]  If 7PM-7AM, Please Contact Night-Floor Coverage  [email or web address] Password [email or web address] 12/11/2014, 11:51 PM     ADDENDUM:   Patient was seen and examined on 12/11/2014

## 2014-12-12 DIAGNOSIS — E43 Unspecified severe protein-calorie malnutrition: Secondary | ICD-10-CM

## 2014-12-12 DIAGNOSIS — I10 Essential (primary) hypertension: Secondary | ICD-10-CM

## 2014-12-12 DIAGNOSIS — I5031 Acute diastolic (congestive) heart failure: Secondary | ICD-10-CM | POA: Insufficient documentation

## 2014-12-12 DIAGNOSIS — J9601 Acute respiratory failure with hypoxia: Secondary | ICD-10-CM

## 2014-12-12 DIAGNOSIS — I5021 Acute systolic (congestive) heart failure: Secondary | ICD-10-CM | POA: Insufficient documentation

## 2014-12-12 LAB — BASIC METABOLIC PANEL
Anion gap: 3 — ABNORMAL LOW (ref 5–15)
BUN: 10 mg/dL (ref 6–23)
CALCIUM: 8.8 mg/dL (ref 8.4–10.5)
CO2: 33 mmol/L — AB (ref 19–32)
Chloride: 99 mmol/L (ref 96–112)
Creatinine, Ser: 1.12 mg/dL (ref 0.50–1.35)
GFR calc non Af Amer: 64 mL/min — ABNORMAL LOW (ref >90)
GFR, EST AFRICAN AMERICAN: 74 mL/min — AB (ref >90)
GLUCOSE: 172 mg/dL — AB (ref 70–99)
POTASSIUM: 3.9 mmol/L (ref 3.5–5.1)
SODIUM: 135 mmol/L (ref 135–145)

## 2014-12-12 LAB — GLUCOSE, CAPILLARY
GLUCOSE-CAPILLARY: 107 mg/dL — AB (ref 70–99)
GLUCOSE-CAPILLARY: 187 mg/dL — AB (ref 70–99)
GLUCOSE-CAPILLARY: 128 mg/dL — AB (ref 70–99)
Glucose-Capillary: 140 mg/dL — ABNORMAL HIGH (ref 70–99)
Glucose-Capillary: 135 mg/dL — ABNORMAL HIGH (ref 70–99)

## 2014-12-12 LAB — CBG MONITORING, ED: Glucose-Capillary: 105 mg/dL — ABNORMAL HIGH (ref 70–99)

## 2014-12-12 MED ORDER — HYDROCODONE-ACETAMINOPHEN 5-325 MG PO TABS
1.0000 | ORAL_TABLET | Freq: Four times a day (QID) | ORAL | Status: DC | PRN
  Administered 2014-12-14: 1 via ORAL
  Filled 2014-12-12: qty 1

## 2014-12-12 MED ORDER — INSULIN ASPART 100 UNIT/ML ~~LOC~~ SOLN: 0.0000 [IU] | Freq: Every day | SUBCUTANEOUS | Status: DC

## 2014-12-12 MED ORDER — ASPIRIN 81 MG PO CHEW
81.0000 mg | CHEWABLE_TABLET | Freq: Every day | ORAL | Status: DC
  Administered 2014-12-12 – 2014-12-14 (×3): 81 mg via ORAL
  Filled 2014-12-12: qty 1

## 2014-12-12 MED ORDER — INSULIN ASPART 100 UNIT/ML ~~LOC~~ SOLN
0.0000 [IU] | Freq: Three times a day (TID) | SUBCUTANEOUS | Status: DC
  Administered 2014-12-12: 1 [IU] via SUBCUTANEOUS
  Administered 2014-12-12: 2 [IU] via SUBCUTANEOUS
  Administered 2014-12-12 – 2014-12-13 (×2): 1 [IU] via SUBCUTANEOUS
  Administered 2014-12-13 – 2014-12-14 (×2): 2 [IU] via SUBCUTANEOUS

## 2014-12-12 MED ORDER — CARVEDILOL 6.25 MG PO TABS
6.2500 mg | ORAL_TABLET | Freq: Two times a day (BID) | ORAL | Status: DC
  Administered 2014-12-12 – 2014-12-14 (×5): 6.25 mg via ORAL
  Filled 2014-12-12 (×7): qty 1

## 2014-12-12 MED ORDER — SODIUM CHLORIDE 0.9 % IV SOLN: 250.0000 mL | INTRAVENOUS | Status: DC | PRN

## 2014-12-12 MED ORDER — POTASSIUM CHLORIDE CRYS ER 10 MEQ PO TBCR
10.0000 meq | EXTENDED_RELEASE_TABLET | Freq: Two times a day (BID) | ORAL | Status: AC
  Administered 2014-12-12 – 2014-12-13 (×3): 10 meq via ORAL
  Filled 2014-12-12 (×4): qty 1

## 2014-12-12 MED ORDER — ENOXAPARIN SODIUM 40 MG/0.4ML ~~LOC~~ SOLN
40.0000 mg | Freq: Every day | SUBCUTANEOUS | Status: DC
  Filled 2014-12-12 (×3): qty 0.4

## 2014-12-12 MED ORDER — FUROSEMIDE 10 MG/ML IJ SOLN
40.0000 mg | Freq: Two times a day (BID) | INTRAMUSCULAR | Status: DC
  Administered 2014-12-12 (×2): 40 mg via INTRAVENOUS
  Filled 2014-12-12 (×4): qty 4

## 2014-12-12 MED ORDER — GABAPENTIN 300 MG PO CAPS
300.0000 mg | ORAL_CAPSULE | Freq: Three times a day (TID) | ORAL | Status: DC
  Administered 2014-12-12 – 2014-12-14 (×7): 300 mg via ORAL
  Filled 2014-12-12 (×10): qty 1

## 2014-12-12 MED ORDER — SODIUM CHLORIDE 0.9 % IJ SOLN
3.0000 mL | Freq: Two times a day (BID) | INTRAMUSCULAR | Status: DC
  Administered 2014-12-12 – 2014-12-14 (×5): 3 mL via INTRAVENOUS

## 2014-12-12 MED ORDER — SODIUM CHLORIDE 0.9 % IJ SOLN: 3.0000 mL | INTRAMUSCULAR | Status: DC | PRN

## 2014-12-12 MED ORDER — LISINOPRIL 5 MG PO TABS
5.0000 mg | ORAL_TABLET | Freq: Every day | ORAL | Status: DC
  Administered 2014-12-12 – 2014-12-14 (×3): 5 mg via ORAL
  Filled 2014-12-12 (×3): qty 1

## 2014-12-12 MED ORDER — ONDANSETRON HCL 4 MG/2ML IJ SOLN: 4.0000 mg | Freq: Four times a day (QID) | INTRAMUSCULAR | Status: DC | PRN

## 2014-12-12 MED ORDER — ENSURE COMPLETE PO LIQD: 237.0000 mL | ORAL | Status: DC | PRN

## 2014-12-12 MED ORDER — ACETAMINOPHEN 325 MG PO TABS: 650.0000 mg | ORAL_TABLET | ORAL | Status: DC | PRN

## 2014-12-12 NOTE — Progress Notes (Signed)
Upon admission to the [facility]itt, pt refused to have feet assessed on his feet assessed and admission hx done. Stated he only wanted to sleep.

## 2014-12-12 NOTE — Progress Notes (Signed)
[facility] PROGRESS NOTE  Russell Hamilton WUJ:[id number] DOB: 1941/08/17 DOA: 12/11/2014 PCP: No PCP Per Patient  Assessment/Plan: 1. Acutely decompensated systolic CHF 1. Recently discharged for the same on 1/19 2. Wt today 69.12kg 3. Wt on recent d/c of 68kg, however wt of 65kg noted in 10/15 2. Acute respiratory failure with hypoxia 1. Improved 2. Pt currently remains on 3L [place] 3. HTN 1. BP remains stable 4. Severe protein calorie malnutrition 1. Will consult Nutrition 5. DM2 1. Cont SSI coverage 6. DVT prophylaxis 1. Lovenox subQ  Code Status: Full Family Communication: Pt in room (indicate person spoken with, relationship, and if by phone, the number) Disposition Plan: Pending   Consultants:    Procedures:    Antibiotics:  none (indicate start date, and stop date if known)  HPI/Subjective: Feels better today. No acute events noted overnight  Objective: Filed Vitals:   12/12/14 0050 12/12/14 0126 12/12/14 0602 12/12/14 1406  BP: 156/93 149/109 147/91 100/60  Pulse: 90 105 86 82  Temp: 97.9 F (36.6 C) 97.6 F (36.4 C) 97.8 F (36.6 C) 98.6 F (37 C)  TempSrc: Oral Oral Oral Oral  Resp: Height:   (1.753 m)    Weight:  69.128 kg (152 lb 6.4 oz)    SpO2: 98% 97% 96% 95%    Intake/Output Summary (Last 24 hours) at 12/12/14 1719 Last data filed at 12/12/14 1405  Gross per 24 hour  Intake    716 ml  Output   2975 ml  Net  -2259 ml   Filed Weights   12/12/14 0126  Weight: 69.128 kg (152 lb 6.4 oz)    Exam:   General:  Awake, in nad  Cardiovascular: regular, s1, s2  Respiratory: normal resp effort, no wheezing  Abdomen: soft,nondistended  Musculoskeletal: perfused, no clubbing   Data Reviewed: Basic Metabolic Panel:  Recent Labs Lab 12/11/14 1909 12/12/14 0553  NA 136 135  K 4.2 3.9  CL 100 99  CO2 26 33*  GLUCOSE 150* 172*  BUN 9 10  CREATININE 1.05 1.12  CALCIUM 9.2 8.8   Liver Function Tests:  Recent  Labs Lab 12/11/14 1909  AST 17  ALT 11  ALKPHOS 140*  BILITOT 1.0  PROT 7.6  ALBUMIN 3.4*   No results for input(s): LIPASE, AMYLASE in the last 168 hours. No results for input(s): AMMONIA in the last 168 hours. CBC:  Recent Labs Lab 12/11/14 1909  WBC 6.3  NEUTROABS 4.4  HGB 13.0  HCT 38.8*  MCV 90.0  PLT 250   Cardiac Enzymes:  Recent Labs Lab 12/11/14 1949  TROPONINI 0.03   BNP (last 3 results)  Recent Labs  11/19/14 2303 11/20/14 0420 12/11/14 1945  BNP 1565.5* 1124.7* 1884.5*    ProBNP (last 3 results)  Recent Labs  08/12/14 1756  PROBNP 7427.0*    CBG:  Recent Labs Lab 12/12/14 0036 12/12/14 0142 12/12/14 0800 12/12/14 1115 12/12/14 1639  GLUCAP 105* 107* 140* 135* 187*    No results found for this or any previous visit (from the past 240 hour(s)).   Studies: Dg Chest 2 View  12/11/2014   CLINICAL DATA:  Shortness of breath while in the supine position over the past several days. Current history of chronic systolic CHF, diabetes and hypertension.  EXAM: CHEST  2 VIEW  COMPARISON:  11/21/2014 dating back to 08/12/2014. CTA chest 08/12/2014.  FINDINGS: AP semi-erect and lateral images were obtained. Cardiac silhouette moderately to markedly enlarged  but stable. 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 Moderately large bilateral pleural effusions with associated dense consolidation in the lower lobes. Degenerative changes throughout the thoracic spine.  IMPRESSION: Stable moderate to marked cardiomegaly. 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 Moderately large bilateral pleural effusions and associated passive atelectasis and/or pneumonia in the lower lobes.   Electronically Signed   By: Hulan Saashomas  Lawrence M.D.   On: 12/11/2014 20:33    Scheduled Meds: . aspirin  81 mg Oral Daily  . carvedilol  6.25 mg Oral BID WC  . enoxaparin (LOVENOX) injection  40 mg Subcutaneous Daily  . furosemide  40 mg Intravenous BID  .  gabapentin  300 mg Oral TID  . insulin aspart  0-5 Units Subcutaneous QHS  . insulin aspart  0-9 Units Subcutaneous TID WC  . lisinopril  5 mg Oral Daily  . potassium chloride  10 mEq Oral BID  . sodium chloride  3 mL Intravenous Q12H   Continuous Infusions:   Principal Problem:   Acute systolic CHF (congestive heart failure), NYHA class 4 Active Problems:   HTN (hypertension)   Protein-calorie malnutrition, severe   Type II diabetes mellitus   Acute respiratory failure with hypoxia    CHIU, STEPHEN K  [facility] Pager [phone]. If 7PM-7AM, please contact night-coverage at [email or web address], password [email or web address] 12/12/2014, 5:19 PM  LOS: 1 day

## 2014-12-12 NOTE — Progress Notes (Signed)
Pt still refusing to let me look at bilateral ankles, pt admits to having dressed wounds but will not let me assess.

## 2014-12-12 NOTE — Progress Notes (Signed)
Pt refused to let me look at wounds on bilateral ankles. Will attempt later in shift.

## 2014-12-13 LAB — BASIC METABOLIC PANEL
ANION GAP: 5 (ref 5–15)
BUN: 19 mg/dL (ref 6–23)
CO2: 31 mmol/L (ref 19–32)
Calcium: 8.5 mg/dL (ref 8.4–10.5)
Chloride: 99 mmol/L (ref 96–112)
Creatinine, Ser: 1.25 mg/dL (ref 0.50–1.35)
GFR calc Af Amer: 65 mL/min — ABNORMAL LOW (ref >90)
GFR calc non Af Amer: 56 mL/min — ABNORMAL LOW (ref >90)
GLUCOSE: 145 mg/dL — AB (ref 70–99)
Potassium: 3.7 mmol/L (ref 3.5–5.1)
Sodium: 135 mmol/L (ref 135–145)

## 2014-12-13 LAB — GLUCOSE, CAPILLARY: GLUCOSE-CAPILLARY: 138 mg/dL — AB (ref 70–99)

## 2014-12-13 MED ORDER — FUROSEMIDE 10 MG/ML IJ SOLN
60.0000 mg | Freq: Two times a day (BID) | INTRAMUSCULAR | Status: AC
  Administered 2014-12-13 (×2): 60 mg via INTRAVENOUS
  Filled 2014-12-13: qty 6

## 2014-12-13 MED ORDER — CETYLPYRIDINIUM CHLORIDE 0.05 % MT LIQD
7.0000 mL | Freq: Two times a day (BID) | OROMUCOSAL | Status: DC
  Administered 2014-12-13 – 2014-12-14 (×3): 7 mL via OROMUCOSAL

## 2014-12-13 MED ORDER — ENSURE COMPLETE PO LIQD
237.0000 mL | Freq: Two times a day (BID) | ORAL | Status: DC
  Administered 2014-12-14: 237 mL via ORAL

## 2014-12-13 MED ORDER — COLLAGENASE 250 UNIT/GM EX OINT
TOPICAL_OINTMENT | Freq: Every day | CUTANEOUS | Status: DC
  Filled 2014-12-13: qty 30

## 2014-12-13 NOTE — Progress Notes (Signed)
[facility] PROGRESS NOTE  Russell MathJames Hamilton JYN:[id number] DOB: 06/06/1941 DOA: 12/11/2014 PCP: No PCP Per Patient  Assessment/Plan: 1. Acutely decompensated systolic CHF 1. Recently discharged for the same on 1/19 2. Wt today remains 69kg 3. Wt on recent d/c of 68kg, however wt of 65kg noted in 10/15 4. Increase lasix to 60mg  IV bid 5. Follow renal function 2. Acute respiratory failure with hypoxia 1. Improved 2. Pt currently remains on 3L [place]. Wean O2 as tolerated 3. HTN 1. BP remains stable and controlled 4. Severe protein calorie malnutrition 1. Have consulted Nutrition 2. Appreciate recs 5. DM2 1. Cont SSI coverage 6. DVT prophylaxis 1. Lovenox subQ  Code Status: Full Family Communication: Pt in room Disposition Plan: Pending   Consultants:    Procedures:    Antibiotics:  none (indicate start date, and stop date if known)  HPI/Subjective: No complaints. Reports feeling better.  Objective: Filed Vitals:   12/12/14 1406 12/12/14 2019 12/13/14 0614 12/13/14 1500  BP: 100/60 100/62 120/77 110/74  Pulse: 82 84 71 70  Temp: 98.6 F (37 C) 98.4 F (36.9 C) 98.2 F (36.8 C) 98.9 F (37.2 C)  TempSrc: Oral Oral Oral Oral  Resp: 20 18 20 20   Height:      Weight:   69 kg (152 lb 1.9 oz)   SpO2: 95% 98% 100% 100%    Intake/Output Summary (Last 24 hours) at 12/13/14 1826 Last data filed at 12/13/14 1700  Gross per 24 hour  Intake   1060 ml  Output   3050 ml  Net  -1990 ml   Filed Weights   12/12/14 0126 12/13/14 0614  Weight: 69.128 kg (152 lb 6.4 oz) 69 kg (152 lb 1.9 oz)    Exam:   General:  Awake, in nad  Cardiovascular: regular, s1, s2  Respiratory: normal resp effort, no wheezing  Abdomen: soft,nondistended  Musculoskeletal: perfused, no clubbing   Data Reviewed: Basic Metabolic Panel:  Recent Labs Lab 12/11/14 1909 12/12/14 0553 12/13/14 0540  NA 136 135 135  K 4.2 3.9 3.7  CL 100 99 99  CO2 26 33* 31  GLUCOSE 150* 172* 145*   BUN 9 10 19   CREATININE 1.05 1.12 1.25  CALCIUM 9.2 8.8 8.5   Liver Function Tests:  Recent Labs Lab 12/11/14 1909  AST 17  ALT 11  ALKPHOS 140*  BILITOT 1.0  PROT 7.6  ALBUMIN 3.4*   No results for input(s): LIPASE, AMYLASE in the last 168 hours. No results for input(s): AMMONIA in the last 168 hours. CBC:  Recent Labs Lab 12/11/14 1909  WBC 6.3  NEUTROABS 4.4  HGB 13.0  HCT 38.8*  MCV 90.0  PLT 250   Cardiac Enzymes:  Recent Labs Lab 12/11/14 1949  TROPONINI 0.03   BNP (last 3 results)  Recent Labs  11/19/14 2303 11/20/14 0420 12/11/14 1945  BNP 1565.5* 1124.7* 1884.5*    ProBNP (last 3 results)  Recent Labs  08/12/14 1756  PROBNP 7427.0*    CBG:  Recent Labs Lab 12/12/14 0800 12/12/14 1115 12/12/14 1639 12/12/14 2059 12/13/14 0640  GLUCAP 140* 135* 187* 128* 138*    No results found for this or any previous visit (from the past 240 hour(s)).   Studies: Dg Chest 2 View  12/11/2014   CLINICAL DATA:  Shortness of breath while in the supine position over the past several days. Current history of chronic systolic CHF, diabetes and hypertension.  EXAM: CHEST  2 VIEW  COMPARISON:  11/21/2014 dating  back to 08/12/2014. CTA chest 08/12/2014.  FINDINGS: AP semi-erect and lateral images were obtained. Cardiac silhouette moderately to markedly enlarged but stable. 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 Moderately large bilateral pleural effusions with associated dense consolidation in the lower lobes. Degenerative changes throughout the thoracic spine.  IMPRESSION: Stable moderate to marked cardiomegaly. 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 Moderately large bilateral pleural effusions and associated passive atelectasis and/or pneumonia in the lower lobes.   Electronically Signed   By: Hulan Saas M.D.   On: 12/11/2014 20:33    Scheduled Meds: . antiseptic oral rinse  7 mL Mouth Rinse BID  . aspirin  81 mg  Oral Daily  . carvedilol  6.25 mg Oral BID WC  . collagenase   Topical Daily  . enoxaparin (LOVENOX) injection  40 mg Subcutaneous Daily  . feeding supplement (ENSURE COMPLETE)  237 mL Oral BID PC  . gabapentin  300 mg Oral TID  . insulin aspart  0-5 Units Subcutaneous QHS  . insulin aspart  0-9 Units Subcutaneous TID WC  . lisinopril  5 mg Oral Daily  . potassium chloride  10 mEq Oral BID  . sodium chloride  3 mL Intravenous Q12H   Continuous Infusions:   Principal Problem:   Acute systolic CHF (congestive heart failure), NYHA class 4 Active Problems:   HTN (hypertension)   Protein-calorie malnutrition, severe   Type II diabetes mellitus   Acute respiratory failure with hypoxia    Russell Hamilton K  [facility] Pager [phone]. If 7PM-7AM, please contact night-coverage at [email or web address], password [facility] 12/13/2014, 6:26 PM  LOS: 2 days

## 2014-12-13 NOTE — Progress Notes (Signed)
The patient would not allow the RN to assess his wounds and also refused his nighttime medications.  His VS were stable overnight.

## 2014-12-13 NOTE — Progress Notes (Signed)
INITIAL NUTRITION ASSESSMENT  DOCUMENTATION CODES Per approved criteria  -Not Applicable   INTERVENTION: Provide Ensure Complete po BID, each supplement provides 350 kcal and 13 grams of protein Encourage PO intake  NUTRITION DIAGNOSIS: Increased nutrient needs related to protein-calorie malnutrition as evidenced by moderate muscle wasting and estimated energy/protein needs.   Goal: Pt to meet >/= 90% of their estimated nutrition needs   Monitor:  PO intake, weight trend, labs, I/O's  Reason for Assessment: Consult for assessment of nutrition status/recommendations  74 y.o. male  Admitting Dx: Acute systolic CHF (congestive heart failure), NYHA class 4  ASSESSMENT: 74 y.o. male with a history of HTN, Acute Systolic CHF, DM2 who presents to the ED with complaints of increased SOB 2 hours PTA to the ED.   Pt states that his appetite is good and he is eating well. Per nursing notes pt is eating 100% of meals. Pt reports that PTA his appetite varied, usually one day a week he doesn't eat anything at all due to poor appetite. He reports that he weighed 185 lbs 2 years ago, lost down to 120 lbs and, has since regained weight. Pt reports feeling weak. Noticeable muscle wasting upon physical exam. Per MD note, pt with severe protein calorie malnutrition. Pt agreeable to drinking nutritional supplements to improve nutrient and protein intake.   Labs: Glucose ranging 105 to 187 mg/dL  Nutrition Focused Physical Exam:  Subcutaneous Fat:  Orbital Region: wnl Upper Arm Region: mild wasting Thoracic and Lumbar Region: NA  Muscle:  Temple Region: wnl Clavicle Bone Region: moderate wasting Clavicle and Acromion Bone Region: moderate wasting Scapular Bone Region: NA Dorsal Hand: moderate wasting Patellar Region: moderate wasting Anterior Thigh Region: moderate wasting Posterior Calf Region: NA  Edema: +2 RLE edema, +3 LLE edema   Height: Ht Readings from Last 1 Encounters:   12/12/14 5\' 9"  (1.753 m)    Weight: Wt Readings from Last 1 Encounters:  12/13/14 152 lb 1.9 oz (69 kg)    Ideal Body Weight: 160 lbs  % Ideal Body Weight: 95%  Wt Readings from Last 10 Encounters:  12/13/14 152 lb 1.9 oz (69 kg)  11/28/14 150 lb (68.04 kg)  11/22/14 149 lb 14.6 oz (68 kg)  10/27/14 151 lb 12.8 oz (68.856 kg)  09/01/14 143 lb (64.864 kg)  08/14/14 143 lb 0.9 oz (64.89 kg)    Usual Body Weight: 185 lbs (2 years ago)  % Usual Body Weight: 82%  BMI:  Body mass index is 22.45 kg/(m^2).  Estimated Nutritional Needs: Kcal: 1900-2100 Protein: 90-100 grams Fluid: 1.9-2.1L/day  Skin: bilateral pressure ulcers on ankles  Diet Order: Diet heart healthy/carb modified  EDUCATION NEEDS: -No education needs identified at this time   Intake/Output Summary (Last 24 hours) at 12/13/14 1105 Last data filed at 12/13/14 0751  Gross per 24 hour  Intake    720 ml  Output   1625 ml  Net   -905 ml    Last BM: 2/2   Labs:   Recent Labs Lab 12/11/14 1909 12/12/14 0553 12/13/14 0540  NA 136 135 135  K 4.2 3.9 3.7  CL 100 99 99  CO2 26 33* 31  BUN 9 10 19   CREATININE 1.05 1.12 1.25  CALCIUM 9.2 8.8 8.5  GLUCOSE 150* 172* 145*    CBG (last 3)   Recent Labs  12/12/14 1639 12/12/14 2059 12/13/14 0640  GLUCAP 187* 128* 138*    Scheduled Meds: . antiseptic oral rinse  7 mL  Mouth Rinse BID  . aspirin  81 mg Oral Daily  . carvedilol  6.25 mg Oral BID WC  . enoxaparin (LOVENOX) injection  40 mg Subcutaneous Daily  . furosemide  60 mg Intravenous BID  . gabapentin  300 mg Oral TID  . insulin aspart  0-5 Units Subcutaneous QHS  . insulin aspart  0-9 Units Subcutaneous TID WC  . lisinopril  5 mg Oral Daily  . potassium chloride  10 mEq Oral BID  . sodium chloride  3 mL Intravenous Q12H    Continuous Infusions:   Past Medical History  Diagnosis Date  . Stroke 2010    "memory problems since"  . Hypertension   . Type II diabetes mellitus   .  Chronic systolic CHF (congestive heart failure)     a. 2D ECHO 08/13/14: EF 40%, diffuse hypokinesis possibly worse in the inferior wall. Mild MR, mild LA dilation, PA pressure 50. Trivial pericardial effusion.  . Acute systolic CHF (congestive heart failure), NYHA class 4 08/12/2014    Past Surgical History  Procedure Laterality Date  . Carotid endarterectomy Right 2010    Ian Malkin RD, LDN Inpatient Clinical Dietitian Pager: [phone] After Hours Pager: [phone]

## 2014-12-13 NOTE — Consult Note (Signed)
WOC wound consult note  Reason for Consult: Bilateral medial malleolar ulcerations with eschar.   Wound Type: Full thickness wound, very unlikely given location and patient mobility that this is pressure related. Pressure Ulcer POA: N/A Measurement: Left medial malleolus: Dried eschar measuring 1.4X1.5cm.  Right medial malleolus with darkened, hard area measuring approximately 3X3cm with eschar center measuring 1X1cm.   Wound bed: Not visible to either wound Drainage (amount, consistency, odor) Scant serous drainage noted from right medial malleolar ulcer, no drainage noted to left. Periwound: Darkened but intact. Dressing procedure/placement/frequency: Pt states that he cleanses wounds with saline at home and covers with a dry bandaid.  On previous admission, patient was receiving santyl to wounds while inpatient.  ABIs done in 2015 are suggestive of adequate perfusion to the BLE.  At this point, an enzymatic debriding agent (collagenase/Santyl) can be used to remove eschar from wounds.  Recommend applying santyl daily to bilateral areas of eschar and top with moist gauze.  Educated patient on treatment plan and dressing recommendations, to which he verbalized understanding.  Carita PianAllyson Hamilton, BSN, RN-BC, Graduate Student Please re-consult if further assistance is needed.  Thank-you,  Russell Mcgeeawn Jadea Shiffer MSN, RN, CWOCN, [place]AP, CNS [phone]

## 2014-12-14 ENCOUNTER — Ambulatory Visit: Payer: Medicare (Managed Care) | Admitting: Cardiology

## 2014-12-14 LAB — BASIC METABOLIC PANEL
Anion gap: 9 (ref 5–15)
BUN: 22 mg/dL (ref 6–23)
CALCIUM: 8.4 mg/dL (ref 8.4–10.5)
CO2: 28 mmol/L (ref 19–32)
Chloride: 98 mmol/L (ref 96–112)
Creatinine, Ser: 1.14 mg/dL (ref 0.50–1.35)
GFR calc Af Amer: 73 mL/min — ABNORMAL LOW (ref >90)
GFR, EST NON AFRICAN AMERICAN: 63 mL/min — AB (ref >90)
GLUCOSE: 121 mg/dL — AB (ref 70–99)
Potassium: 3.4 mmol/L — ABNORMAL LOW (ref 3.5–5.1)
Sodium: 135 mmol/L (ref 135–145)

## 2014-12-14 LAB — GLUCOSE, CAPILLARY
GLUCOSE-CAPILLARY: 174 mg/dL — AB (ref 70–99)
Glucose-Capillary: 171 mg/dL — ABNORMAL HIGH (ref 70–99)
Glucose-Capillary: 159 mg/dL — ABNORMAL HIGH (ref 70–99)
Glucose-Capillary: 179 mg/dL — ABNORMAL HIGH (ref 70–99)

## 2014-12-14 MED ORDER — COLLAGENASE 250 UNIT/GM EX OINT: TOPICAL_OINTMENT | Freq: Every day | CUTANEOUS | Status: DC

## 2014-12-14 NOTE — Discharge Summary (Signed)
Physician Discharge Summary  Russell Hamilton ZOX:[id number] DOB: [birth year] DOA: 12/11/2014  PCP: No PCP Per Patient  Admit date: 12/11/2014 Discharge date: 12/14/2014  Time spent: 25 minutes  Recommendations for Outpatient Follow-up:  1. Follow up with Heart Failure clinic as scheduled on 2/22  Discharge Diagnoses:  Principal Problem:   Acute systolic CHF (congestive heart failure), NYHA class 4 Active Problems:   HTN (hypertension)   Protein-calorie malnutrition, severe   Type II diabetes mellitus   Acute respiratory failure with hypoxia   Discharge Condition: Improved  Diet recommendation: Diabetic, heart healthy  Filed Weights   12/12/14 0126 12/13/14 0614 12/14/14 0524  Weight: 69.128 kg (152 lb 6.4 oz) 69 kg (152 lb 1.9 oz) 64.501 kg (142 lb 3.2 oz)    History of present illness:  Please review h and p from 2/6 for details. Briefly, pt re-admits to hospital with acute heart failure with strong concerns for medical and dietary noncompliance. Pt was admitted for further work up.  Hospital Course:  1. Acutely decompensated systolic CHF 1. Recently discharged for the same on 1/19 2. Strong concerns for both dietary and medical noncompliance 3. Wt today now down to 64.5kg 4. Wt on recent d/c of 68kg, however wt of 65kg noted in 10/15 5. Wt on presentation of 69kg 6. Patient was continued on BID IV lasix with improvement 7. Pt is to follow up with clinic as scheduled. Of note, pt was a "no show" for clinic that was set up for him from last admit 2. Acute respiratory failure with hypoxia 1. Improved 2. Pt currently remains on 3L [place], which is his baseline requirements. Wean O2 as tolerated 3. HTN 1. BP remained stable and controlled 4. Severe protein calorie malnutrition 1. Had consulted Nutrition 2. Appreciate recs 5. DM2 1. Cont SSI coverage 6. DVT prophylaxis 1. Lovenox subQ 7. Left medial malleolus eschar 1. WOC consulted with recs as follows: Pt states that he cleanses  wounds with saline at home and covers with a dry bandaid. On previous admission, patient was receiving santyl to wounds while inpatient. ABIs done in 2015 are suggestive of adequate perfusion to the BLE. At this point, an enzymatic debriding agent (collagenase/Santyl) can be used to remove eschar from wounds. Recommend applying santyl daily to bilateral areas of eschar and top with moist gauze.  Consultations:  none  Discharge Exam: Filed Vitals:   12/13/14 1900 12/13/14 2024 12/14/14 0524 12/14/14 0955  BP: 116/76 119/75 103/61 112/66  Pulse: 72 85 71   Temp: 98 F (36.7 C) 97.7 F (36.5 C) 97.7 F (36.5 C)   TempSrc: Oral Oral Oral   Resp: Height:      Weight:   64.501 kg (142 lb 3.2 oz)   SpO2: 100% 98% 99%     General: Awake, in nad Cardiovascular: regular, s1, s2 Respiratory: normal resp effort, no wheezing  Discharge Instructions     Medication List    TAKE these medications        albuterol 108 (90 BASE) MCG/ACT inhaler  Commonly known as:  PROVENTIL HFA;VENTOLIN HFA  Inhale 2 puffs into the lungs every 6 (six) hours as needed for wheezing or shortness of breath.     aspirin 81 MG chewable tablet  Chew 1 tablet (81 mg total) by mouth daily.     carvedilol 6.25 MG tablet  Commonly known as:  COREG  Take 1 tablet (6.25 mg total) by mouth 2 (two) times daily with a  meal.     clindamycin 150 MG capsule  Commonly known as:  CLEOCIN  Take 3 capsules (450 mg total) by mouth 3 (three) times daily.     collagenase ointment  Commonly known as:  SANTYL  Apply topically daily.     furosemide 40 MG tablet  Commonly known as:  LASIX  Take 40 mg by mouth daily.     gabapentin 300 MG capsule  Commonly known as:  NEURONTIN  Take 1 capsule (300 mg total) by mouth 3 (three) times daily.     glimepiride 2 MG tablet  Commonly known as:  AMARYL  Take 1 tablet (2 mg total) by mouth daily with breakfast.     HYDROcodone-acetaminophen 5-325 MG per tablet   Commonly known as:  NORCO/VICODIN  Take 1 tablet by mouth every 6 (six) hours as needed for severe pain.     lactose free nutrition Liqd  Take 237 mLs by mouth as needed (occasionally).     lisinopril 5 MG tablet  Commonly known as:  PRINIVIL,ZESTRIL  Take 1 tablet (5 mg total) by mouth daily.     potassium chloride SA 20 MEQ tablet  Commonly known as:  K-DUR,KLOR-CON  Take 2 tablets (40 mEq total) by mouth 2 (two) times daily.       No Known Allergies Follow-up Information    Follow up with Jacolyn Reedy, PA-C On 12/27/2014.   Specialty:  Cardiology   Why:  at 2:15pm-- for your Heart Failure   Contact information:   [address] [place] [address] [phone]        The results of significant diagnostics from this hospitalization (including imaging, microbiology, ancillary and laboratory) are listed below for reference.    Significant Diagnostic Studies: Dg Chest 2 View  12/11/2014   CLINICAL DATA:  Shortness of breath while in the supine position over the past several days. Current history of chronic systolic CHF, diabetes and hypertension.  EXAM: CHEST  2 VIEW  COMPARISON:  11/21/2014 dating back to 08/12/2014. CTA chest 08/12/2014.  FINDINGS: AP semi-erect and lateral images were obtained. Cardiac silhouette moderately to markedly enlarged but stable. 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 Moderately large bilateral pleural effusions with associated dense consolidation in the lower lobes. Degenerative changes throughout the thoracic spine.  IMPRESSION: Stable moderate to marked cardiomegaly. 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 Moderately large bilateral pleural effusions and associated passive atelectasis and/or pneumonia in the lower lobes.   Electronically Signed   By: Hulan Saas M.D.   On: 12/11/2014 20:33   Dg Chest 2 View  11/21/2014   CLINICAL DATA:  Follow-up bilateral pleural effusions.  EXAM: CHEST  2  VIEW  COMPARISON:  11/19/2014, 08/12/2014.  FINDINGS: Moderate-sized bilateral pleural effusions, slightly decreased in size since the examination 2 days ago. Improved aeration in the right lower lobe. Stable dense consolidation in the left lower lobe. No new pulmonary parenchymal abnormalities. Cardiac silhouette moderately enlarged but stable. Mild pulmonary venous hypertension without overt edema. Degenerative changes and DISH involving the thoracic spine.  IMPRESSION: 1. Moderate-sized bilateral pleural effusions, slightly decreased in size since the examination 2 days ago. 2. Improved aeration in the right lower lobe. Stable dense passive atelectasis and/or pneumonia in the left lower lobe. 3. No new abnormalities.   Electronically Signed   By: Hulan Saas M.D.   On: 11/21/2014 10:56   Dg Chest 2 View  11/19/2014   CLINICAL DATA:  Two day history of  productive cough and shortness of breath. Current history of hypertension. Prior episodes of CHF.  EXAM: CHEST  2 VIEW  COMPARISON:  Two-view chest x-ray and CTA chest 08/12/2014.  FINDINGS: Cardiac silhouette moderately enlarged for the AP semi-erect technique, unchanged. Pulmonary vascularity normal without evidence of pulmonary edema currently. Large bilateral pleural effusions, left greater than right, with associated dense consolidation in the lower lobes. Fluid in the major fissure on the right. Upper lobes remain clear. Degenerative changes throughout the thoracic and visualized lumbar spine and in the right acromioclavicular joint.  IMPRESSION: Stable moderate cardiomegaly without evidence of pulmonary edema currently. Large bilateral pleural effusions, left greater than right, with associated dense passive atelectasis versus pneumonia in the lower lobes.   Electronically Signed   By: Hulan Saashomas  Lawrence M.D.   On: 11/19/2014 21:01   Dg Foot 2 Views Right  11/20/2014   CLINICAL DATA:  Foot ulcer over medial malleolus.  EXAM: RIGHT FOOT - 2 VIEW   COMPARISON:  None.  FINDINGS: Skin thickening and probable ulcer about the medial ankle. There is a questionable adjacent periosteal reaction of the medial malleolus. This would be better assessed on dedicated ankle series. There is mild hallux valgus and degenerative change in the first metatarsal phalangeal joint. No acute fracture. Moderate plantar calcaneal spur and small Achilles tendon enthesophyte. There is an os navicular. No findings in the foot to suggest osteomyelitis.  IMPRESSION: Soft tissue ulcer about the medial ankle. There is questionable subjacent periosteal reaction about the medial malleolus. This could be better assessed on dedicated three-view ankle radiographs. There is no evidence of osteomyelitis of foot.  Mild hallux valgus and degenerative change of the first metatarsal phalangeal joint.   Electronically Signed   By: Rubye OaksMelanie  Ehinger M.D.   On: 11/20/2014 02:20   Dg Foot Complete Left  11/28/2014   CLINICAL DATA:  Lateral left foot blister appearing 1 day ago. Wound abscess. Pain in both legs and feet.  EXAM: LEFT FOOT - COMPLETE 3+ VIEW  COMPARISON:  None.  FINDINGS: There is marked soft tissue swelling involving the lateral and dorsal aspects of the foot adjacent to the fifth metatarsal. No radiopaque foreign body or adjacent osseous erosion is identified. No soft tissue emphysema is identified. No acute fracture or dislocation is seen. Moderate sized posterior calcaneal enthesophyte is noted. Mild joint space narrowing and marginal spurring are noted at the first MTP joint.  IMPRESSION: Marked soft tissue swelling at the lateral aspect of the left foot, nonspecific but consistent with provided history of abscess. No osseous erosion or acute fracture identified.   Electronically Signed   By: Sebastian AcheAllen  Grady   On: 11/28/2014 20:21    Microbiology: No results found for this or any previous visit (from the past 240 hour(s)).   Labs: Basic Metabolic Panel:  Recent Labs Lab  12/11/14 1909 12/12/14 0553 12/13/14 0540 12/14/14 0520  NA 136 135 135 135  K 4.2 3.9 3.7 3.4*  CL 100 99 99 98  CO2 26 33* 31 28  GLUCOSE 150* 172* 145* 121*  BUN 9 10 19 22   CREATININE 1.05 1.12 1.25 1.14  CALCIUM 9.2 8.8 8.5 8.4   Liver Function Tests:  Recent Labs Lab 12/11/14 1909  AST 17  ALT 11  ALKPHOS 140*  BILITOT 1.0  PROT 7.6  ALBUMIN 3.4*   No results for input(s): LIPASE, AMYLASE in the last 168 hours. No results for input(s): AMMONIA in the last 168 hours. CBC:  Recent Labs Lab  12/11/14 1909  WBC 6.3  NEUTROABS 4.4  HGB 13.0  HCT 38.8*  MCV 90.0  PLT 250   Cardiac Enzymes:  Recent Labs Lab 12/11/14 1949  TROPONINI 0.03   BNP: BNP (last 3 results)  Recent Labs  11/19/14 2303 11/20/14 0420 12/11/14 1945  BNP 1565.5* 1124.7* 1884.5*    ProBNP (last 3 results)  Recent Labs  08/12/14 1756  PROBNP 7427.0*    CBG:  Recent Labs Lab 12/12/14 1115 12/12/14 1639 12/12/14 2059 12/13/14 0640 12/14/14 1205  GLUCAP 135* 187* 128* 138* 179*   Signed:  Mallorie Norrod K  [facility] 12/14/2014, 1:46 PM

## 2014-12-14 NOTE — Progress Notes (Signed)
Attempted to read/go over  d/c instructions with pt. Pt refused stated my  niece is down stairs waiting for me, I have to go I know about my meds."  Pt refused to wait for nurse to [facility].  Raynelle FanningJulie CM made aware when she called.  Amanda PeaNellie Elianny Buxbaum, Charity fundraiserN.

## 2014-12-14 NOTE — Progress Notes (Signed)
At 1535 all d/c instructions explained and given to pt.  Verbalized understanding.  D/c off florr via w/c to awaiting transport.  Amanda PeaNellie Waller Marcussen, Charity fundraiserN.

## 2014-12-14 NOTE — Progress Notes (Signed)
Heart Failure Navigator Consult Note  Presentation: Russell Hamilton is a 74 y.o. male with a history of HTN, Acute Systolic CHF, DM2 who presents to the ED with complaints of increased SOB 2 hours PTA to the ED. He denies any chest pain or fevers or chills. He was found to have decreased O2 sats into the 80's.    Past Medical History  Diagnosis Date  . Stroke 2010    "memory problems since"  . Hypertension   . Type II diabetes mellitus   . Chronic systolic CHF (congestive heart failure)     a. 2D ECHO 08/13/14: EF 40%, diffuse hypokinesis possibly worse in the inferior wall. Mild MR, mild LA dilation, PA pressure 50. Trivial pericardial effusion.  . Acute systolic CHF (congestive heart failure), NYHA class 4 08/12/2014    History   Social History  . Marital Status: Single    Spouse Name: N/A    Number of Children: N/A  . Years of Education: N/A   Social History Main Topics  . Smoking status: Current Every Day Smoker -- 0.50 packs/day for 56 years    Types: Cigarettes  . Smokeless tobacco: Never Used  . Alcohol Use: No     Comment: "quit drinking in 1976  . Drug Use: No  . Sexual Activity: Not Currently   Other Topics Concern  . None   Social History Narrative    ECHO:Study Conclusions--08/13/14  - Left ventricle: Diffuse hypokinesis possibly worse in inferior wall. The cavity size was mildly dilated. Wall thickness was normal. The estimated ejection fraction was 40%. - Mitral valve: Calcified annulus. There was mild regurgitation. - Left atrium: The atrium was mildly dilated. - Atrial septum: No defect or patent foramen ovale was identified. - Pulmonary arteries: PA peak pressure: 50 mm Hg (S). - Pericardium, extracardiac: A trivial pericardial effusion was identified.  Transthoracic echocardiography. M-mode, complete 2D, spectral Doppler, and color Doppler. Birthdate: Patient birthdate: Sep 17, 1941. Age: Patient is 74 yr old. Sex: Gender: male. BMI:  20.6 kg/m^2. Blood pressure:   142/94 Patient status: Inpatient. Study date: Study date: 08/13/2014. Study time: 10:51 AM. Location: Bedside.  BNP    Component Value Date/Time   BNP 1884.5* 12/11/2014 1945    ProBNP    Component Value Date/Time   PROBNP 7427.0* 08/12/2014 1756     Education Assessment and Provision:  Detailed education and instructions provided on heart failure disease management including the following:  Signs and symptoms of Heart Failure When to call the physician Importance of daily weights Low sodium diet Fluid restriction Medication management Anticipated future follow-up appointments  Patient education given on each of the above topics.  Patient acknowledges understanding and acceptance of all instructions.  I spoke for a brief while with Russell Hamilton.  He spoke to me for a good part of our conversation with the sheet on his head.  He does not have a scale and says that even if I provided him one --he would not use.  He eats "everything I can".  I did reinforce a low sodium diet and high sodium foods to avoid.  He lives alone and has a niece that assists him with transportation as needed.  He claims to have no issue getting medications and says that he takes them as directed.  I have scheduled follow-up with [facility] for Feb. 22 at 2:15pm (which she says is first available hosp F/U).  Education Materials:  "Living Better With Heart Failure" Booklet, Daily Weight Tracker Tool  High Risk Criteria for Readmission and/or Poor Patient Outcomes:    EF <30%- No 40%  2 or more admissions in 6 months- Yes  Difficult social situation- Yes--lives alone and seems quite resistant to HF recommendations  Demonstrates medication noncompliance- No ?    Barriers of Care:  Knowledge and compliance  Discharge Planning:   Plans to discharge to home alone with Niece available for transportation to and from appts.  I have schedule first available post  hospital F/U appt with [facility] on Feb 22 at 2:15pm.

## 2014-12-15 ENCOUNTER — Ambulatory Visit: Payer: Medicare (Managed Care) | Attending: Internal Medicine | Admitting: Physician Assistant

## 2014-12-15 VITALS — BP 133/83 | HR 80 | Temp 97.4°F | Resp 16 | Ht 69.5 in | Wt 152.6 lb

## 2014-12-15 DIAGNOSIS — F17200 Nicotine dependence, unspecified, uncomplicated: Secondary | ICD-10-CM | POA: Insufficient documentation

## 2014-12-15 DIAGNOSIS — I10 Essential (primary) hypertension: Secondary | ICD-10-CM | POA: Diagnosis not present

## 2014-12-15 DIAGNOSIS — I739 Peripheral vascular disease, unspecified: Secondary | ICD-10-CM | POA: Insufficient documentation

## 2014-12-15 DIAGNOSIS — I5022 Chronic systolic (congestive) heart failure: Secondary | ICD-10-CM | POA: Diagnosis present

## 2014-12-15 DIAGNOSIS — Z792 Long term (current) use of antibiotics: Secondary | ICD-10-CM | POA: Insufficient documentation

## 2014-12-15 DIAGNOSIS — E119 Type 2 diabetes mellitus without complications: Secondary | ICD-10-CM | POA: Insufficient documentation

## 2014-12-15 DIAGNOSIS — I69398 Other sequelae of cerebral infarction: Secondary | ICD-10-CM | POA: Diagnosis not present

## 2014-12-15 LAB — GLUCOSE, CAPILLARY: Glucose-Capillary: 118 mg/dL — ABNORMAL HIGH (ref 70–99)

## 2014-12-15 MED ORDER — LISINOPRIL 5 MG PO TABS
5.0000 mg | ORAL_TABLET | Freq: Every day | ORAL | Status: DC
Start: 2014-12-15 — End: 2015-02-10

## 2014-12-15 MED ORDER — ASPIRIN 81 MG PO CHEW: 81.0000 mg | CHEWABLE_TABLET | Freq: Every day | ORAL | Status: DC

## 2014-12-15 MED ORDER — FUROSEMIDE 40 MG PO TABS: 40.0000 mg | ORAL_TABLET | Freq: Every day | ORAL | Status: DC

## 2014-12-15 MED ORDER — POTASSIUM CHLORIDE CRYS ER 20 MEQ PO TBCR: 40.0000 meq | EXTENDED_RELEASE_TABLET | Freq: Two times a day (BID) | ORAL | Status: DC

## 2014-12-15 MED ORDER — CARVEDILOL 6.25 MG PO TABS: 6.2500 mg | ORAL_TABLET | Freq: Two times a day (BID) | ORAL | Status: DC

## 2014-12-15 NOTE — Care Management Note (Signed)
    Page 1 of 1   12/15/2014     4:39:10 PM CARE MANAGEMENT NOTE 12/15/2014  Patient:  [name],Geddy   Account Number:  [id number]  Date Initiated:  12/13/2014  Documentation initiated by:  Vega Withrow  Subjective/Objective Assessment:   Pt adm on 12/11/14 with CHF.  PTA, pt lives alone, has supportive niece, Archie Pattenonya.     Action/Plan:   Will follow for dc needs; PT recommending HH follow up/   Anticipated DC Date:  12/14/2014   Anticipated DC Plan:  HOME W HOME HEALTH SERVICES      DC Planning Services  CM consult  Follow-up appt scheduled  [facility]  PCP issues      Choice offered to / List presented to:             Status of service:  Completed, signed off Medicare Important Message given?  YES (If response is "NO", the following Medicare IM given date fields will be blank) Date Medicare IM given:  12/14/2014 Medicare IM given by:  Shanti Agresti Date Additional Medicare IM given:   Additional Medicare IM given by:    Discharge Disposition:  HOME/SELF CARE  Per UR Regulation:  Reviewed for med. necessity/level of care/duration of stay  If discussed at Long Length of Stay Meetings, dates discussed:    Comments:  12/14/14 Sidney AceJulie Genevia Bouldin, RN, BSN [phone] [facility].  [place], and apparently his insurance is a [name] plan.  After multiple phone calls, I spoke with rep from his insurance company--[phone]):  he states that insurance is not valid in this state, except for emergency room and urgent care visits.  Rep states pt will need to enroll in a Medicare plan that serves [place], as this one does not.  I will notify pt's niece to assist him with this, as pt quite noncompliant regarding follow up.  [facility]--pt states he may not go to appt. Pt will have to [facility] at this point, which he will not be agreeable to.  MD notified of  issues with insurance and HH set up.

## 2014-12-15 NOTE — Progress Notes (Signed)
Pt presents to clinic for follow up after hospitalization of CHF.

## 2014-12-15 NOTE — Progress Notes (Signed)
Russell Hamilton  JYN:[id number]  QMV:[id number]  DOB - 12/19/1940  Chief Complaint  Patient presents with  . Follow-up  . Congestive Heart Failure       Subjective:   Russell Hamilton is a 74 y.o. male here today for establishment of care. He was hospitalized from 12/11/2014 through 12/14/2014 for acute hypoxic respiratory failure secondary to and acute on chronic CHF exacerbation. He initially presented with shortness of breath. He was found to be hypoxic with O2 saturations in the 80s. Chest x-ray was consistent with bilateral pleural effusions suggestive of congestive heart failure. He was diuresed. Supplemental oxygen was placed. He has had minimal adjustment to his medication management. He admits to running out of medications and not being aware of his diet. His weight came down to 10 pounds while in the hospital.  11. Patient hospitalized in January for similar presentation.  His potassium is 3.4 yesterday. He is supposed be on potassium at home. His last echocardiogram was in October of last year and his ejection fraction was 40% at that time.  Since discharge he states that he is breathing better. His appetite is slowly improving. He denies chest pain..    ROS: GEN: denies fever or chills, denies change in weight Skin: denies lesions or rashes HEENT: denies headache, earache, epistaxis, sore throat, or neck pain LUNGS: denies SHOB, dyspnea, PND, orthopnea CV: denies CP or palpitations ABD: denies abd pain, N or V EXT: denies muscle spasms or swelling; no pain in lower ext, no weakness NEURO: denies numbness or tingling, denies sz, stroke or TIA   ALLERGIES: No Known Allergies  PAST MEDICAL HISTORY: Past Medical History  Diagnosis Date  . Stroke 2010    "memory problems since"  . Hypertension   . Type II diabetes mellitus   . Chronic systolic CHF (congestive heart failure)     a. 2D ECHO 08/13/14: EF 40%, diffuse hypokinesis possibly worse in the inferior wall. Mild MR,  mild LA dilation, PA pressure 50. Trivial pericardial effusion.  . Acute systolic CHF (congestive heart failure), NYHA class 4 08/12/2014    PAST SURGICAL HISTORY: Past Surgical History  Procedure Laterality Date  . Carotid endarterectomy Right 2010    MEDICATIONS AT HOME: Prior to Admission medications   Medication Sig Start Date End Date Taking? Authorizing Provider  albuterol (PROVENTIL HFA;VENTOLIN HFA) 108 (90 BASE) MCG/ACT inhaler Inhale 2 puffs into the lungs every 6 (six) hours as needed for wheezing or shortness of breath.    Historical Provider, MD  aspirin 81 MG chewable tablet Chew 1 tablet (81 mg total) by mouth daily. 12/15/14   Vivianne Masteriffany S Noel, PA-C  carvedilol (COREG) 6.25 MG tablet Take 1 tablet (6.25 mg total) by mouth 2 (two) times daily with a meal. 12/15/14   Vivianne Masteriffany S Noel, PA-C  clindamycin (CLEOCIN) 150 MG capsule Take 3 capsules (450 mg total) by mouth 3 (three) times daily. 11/28/14   Mellody DrownLauren Parker, PA-C  collagenase (SANTYL) ointment Apply topically daily. 12/14/14   Jerald KiefStephen K Chiu, MD  furosemide (LASIX) 40 MG tablet Take 1 tablet (40 mg total) by mouth daily. 12/15/14   Vivianne Masteriffany S Noel, PA-C  gabapentin (NEURONTIN) 300 MG capsule Take 1 capsule (300 mg total) by mouth 3 (three) times daily. 08/14/14   Richarda OverlieNayana Abrol, MD  glimepiride (AMARYL) 2 MG tablet Take 1 tablet (2 mg total) by mouth daily with breakfast. 11/23/14   Joseph ArtJessica U Vann, DO  HYDROcodone-acetaminophen (NORCO/VICODIN) 5-325 MG per tablet Take 1 tablet by  mouth every 6 (six) hours as needed for severe pain. 11/23/14   Joseph Art, DO  lactose free nutrition (BOOST) LIQD Take 237 mLs by mouth as needed (occasionally).    Historical Provider, MD  lisinopril (PRINIVIL,ZESTRIL) 5 MG tablet Take 1 tablet (5 mg total) by mouth daily. 12/15/14   Vivianne Master, PA-C  potassium chloride SA (K-DUR,KLOR-CON) 20 MEQ tablet Take 2 tablets (40 mEq total) by mouth 2 (two) times daily. 12/15/14   Vivianne Master, PA-C      Objective:   Filed Vitals:   12/15/14 1035  BP: 133/83  Pulse: 80  Temp: 97.4 F (36.3 C)  TempSrc: Oral  Resp: 16  Height: 5' 9.5" (1.765 m)  Weight: 152 lb 9.6 oz (69.219 kg)    Exam General appearance : Awake, alert, not in any distress. Speech Clear. Not toxic looking HEENT: Atraumatic and Normocephalic, pupils equally reactive to light and accomodation Neck: supple, no JVD. No cervical lymphadenopathy.  Chest:Good air entry bilaterally, no added sounds  CVS: S1 S2 regular, no murmurs.  Abdomen: Bowel sounds present, Non tender and not distended with no gaurding, rigidity or rebound. Extremities: B/L Lower Ext shows no edema, both legs are warm to touch Neurology: Awake alert, and oriented X 3, CN II-XII intact, Non focal Skin:No Rash Wounds:N/A  Data Review Lab Results  Component Value Date   HGBA1C 7.5* 11/20/2014   HGBA1C 6.9* 08/13/2014     Assessment & Plan  1. Chronic systolic congestive heart failure  -Continue aspirin, Coreg, ACE inhibitor, potassium, Lasix-refills provided  -BMP in 1 week  -CHF clinic on February 22 at 2:15 PM  -Encouraged to check his weight to 3 times weekly and keep a log  -Sodium restrictions 2. Hypertension  -Continue current medications  -Watch sodium intake   3. Diabetes type 2  -Continue current regimen  -BMP in 1 week. Discussed further with primary attending at next visit.  -Smoking cessation discussed 4. PAD-stable 5. Hx CVA-stable   Return in about 1 month (around 01/13/2015).  The patient was given clear instructions to go to ER or return to medical center if symptoms don't improve, worsen or new problems develop. The patient verbalized understanding. The patient was told to call to get lab results if they haven't heard anything in the next week.   This note has been created with Education officer, environmental. Any transcriptional errors are unintentional.    Scot Jun,  PA-C [facility] and [facility] [place], [place] [phone]   12/15/2014, 11:06 AM

## 2014-12-15 NOTE — Patient Instructions (Signed)
Watch your sodium intake. Watch your processed and canned foods. Taking medications every day as prescribed. Keep appointment with the heart failure clinic for February 22. Return here in one week for blood work only. Return here 1 month to see the doctor. Keep track of your weight as best as you can.

## 2014-12-23 ENCOUNTER — Inpatient Hospital Stay: Payer: Medicare (Managed Care) | Admitting: Internal Medicine

## 2014-12-27 ENCOUNTER — Encounter: Payer: Medicare (Managed Care) | Admitting: Physician Assistant

## 2015-02-08 ENCOUNTER — Emergency Department (HOSPITAL_COMMUNITY): Payer: Medicare (Managed Care)

## 2015-02-08 ENCOUNTER — Inpatient Hospital Stay (HOSPITAL_COMMUNITY)
Admission: EM | Admit: 2015-02-08 | Discharge: 2015-02-10 | DRG: 293 | Disposition: A | Discharge status: Home / Self Care | Payer: Medicare (Managed Care) | Attending: Internal Medicine | Admitting: Internal Medicine

## 2015-02-08 ENCOUNTER — Encounter (HOSPITAL_COMMUNITY): Payer: Self-pay | Admitting: Emergency Medicine

## 2015-02-08 ENCOUNTER — Other Ambulatory Visit (HOSPITAL_COMMUNITY): Payer: Self-pay

## 2015-02-08 DIAGNOSIS — R05 Cough: Secondary | ICD-10-CM | POA: Diagnosis present

## 2015-02-08 DIAGNOSIS — L97529 Non-pressure chronic ulcer of other part of left foot with unspecified severity: Secondary | ICD-10-CM | POA: Diagnosis present

## 2015-02-08 DIAGNOSIS — E1129 Type 2 diabetes mellitus with other diabetic kidney complication: Secondary | ICD-10-CM

## 2015-02-08 DIAGNOSIS — Z8673 Personal history of transient ischemic attack (TIA), and cerebral infarction without residual deficits: Secondary | ICD-10-CM | POA: Diagnosis not present

## 2015-02-08 DIAGNOSIS — K219 Gastro-esophageal reflux disease without esophagitis: Secondary | ICD-10-CM | POA: Diagnosis present

## 2015-02-08 DIAGNOSIS — R053 Chronic cough: Secondary | ICD-10-CM | POA: Diagnosis present

## 2015-02-08 DIAGNOSIS — E119 Type 2 diabetes mellitus without complications: Secondary | ICD-10-CM | POA: Diagnosis present

## 2015-02-08 DIAGNOSIS — L97509 Non-pressure chronic ulcer of other part of unspecified foot with unspecified severity: Secondary | ICD-10-CM | POA: Diagnosis present

## 2015-02-08 DIAGNOSIS — I5023 Acute on chronic systolic (congestive) heart failure: Secondary | ICD-10-CM | POA: Diagnosis not present

## 2015-02-08 DIAGNOSIS — IMO0002 Reserved for concepts with insufficient information to code with codable children: Secondary | ICD-10-CM

## 2015-02-08 DIAGNOSIS — G629 Polyneuropathy, unspecified: Secondary | ICD-10-CM | POA: Diagnosis present

## 2015-02-08 DIAGNOSIS — I5021 Acute systolic (congestive) heart failure: Secondary | ICD-10-CM | POA: Diagnosis not present

## 2015-02-08 DIAGNOSIS — I10 Essential (primary) hypertension: Secondary | ICD-10-CM | POA: Diagnosis present

## 2015-02-08 DIAGNOSIS — Z7982 Long term (current) use of aspirin: Secondary | ICD-10-CM | POA: Diagnosis not present

## 2015-02-08 DIAGNOSIS — I509 Heart failure, unspecified: Secondary | ICD-10-CM

## 2015-02-08 DIAGNOSIS — F17210 Nicotine dependence, cigarettes, uncomplicated: Secondary | ICD-10-CM | POA: Diagnosis present

## 2015-02-08 DIAGNOSIS — Z720 Tobacco use: Secondary | ICD-10-CM | POA: Diagnosis present

## 2015-02-08 DIAGNOSIS — E1165 Type 2 diabetes mellitus with hyperglycemia: Secondary | ICD-10-CM

## 2015-02-08 DIAGNOSIS — R0602 Shortness of breath: Secondary | ICD-10-CM | POA: Diagnosis not present

## 2015-02-08 HISTORY — DX: Gastro-esophageal reflux disease without esophagitis: K21.9

## 2015-02-08 HISTORY — DX: Polyneuropathy, unspecified: G62.9

## 2015-02-08 LAB — I-STAT TROPONIN, ED: TROPONIN I, POC: 0.02 ng/mL (ref 0.00–0.08)

## 2015-02-08 LAB — CBC WITH DIFFERENTIAL/PLATELET
Basophils Absolute: 0 10*3/uL (ref 0.0–0.1)
Basophils Relative: 0 % (ref 0–1)
Eosinophils Absolute: 0.1 10*3/uL (ref 0.0–0.7)
Eosinophils Relative: 2 % (ref 0–5)
HCT: 38.2 % — ABNORMAL LOW (ref 39.0–52.0)
Hemoglobin: 12.6 g/dL — ABNORMAL LOW (ref 13.0–17.0)
Lymphocytes Relative: 29 % (ref 12–46)
Lymphs Abs: 1.5 10*3/uL (ref 0.7–4.0)
MCH: 29.8 pg (ref 26.0–34.0)
MCHC: 33 g/dL (ref 30.0–36.0)
MCV: 90.3 fL (ref 78.0–100.0)
MONO ABS: 0.5 10*3/uL (ref 0.1–1.0)
Monocytes Relative: 10 % (ref 3–12)
NEUTROS ABS: 2.9 10*3/uL (ref 1.7–7.7)
NEUTROS PCT: 59 % (ref 43–77)
Platelets: 171 10*3/uL (ref 150–400)
RBC: 4.23 MIL/uL (ref 4.22–5.81)
RDW: 15.7 % — AB (ref 11.5–15.5)
WBC: 5 10*3/uL (ref 4.0–10.5)

## 2015-02-08 LAB — BASIC METABOLIC PANEL
Anion gap: 11 (ref 5–15)
BUN: 11 mg/dL (ref 6–23)
CALCIUM: 8.8 mg/dL (ref 8.4–10.5)
CO2: 21 mmol/L (ref 19–32)
Chloride: 102 mmol/L (ref 96–112)
Creatinine, Ser: 1.14 mg/dL (ref 0.50–1.35)
GFR calc Af Amer: 73 mL/min — ABNORMAL LOW (ref >90)
GFR, EST NON AFRICAN AMERICAN: 63 mL/min — AB (ref >90)
Glucose, Bld: 177 mg/dL — ABNORMAL HIGH (ref 70–99)
Potassium: 4 mmol/L (ref 3.5–5.1)
Sodium: 134 mmol/L — ABNORMAL LOW (ref 135–145)

## 2015-02-08 LAB — BRAIN NATRIURETIC PEPTIDE: B Natriuretic Peptide: 1355.4 pg/mL — ABNORMAL HIGH (ref 0.0–100.0)

## 2015-02-08 LAB — GLUCOSE, CAPILLARY: Glucose-Capillary: 184 mg/dL — ABNORMAL HIGH (ref 70–99)

## 2015-02-08 LAB — I-STAT CG4 LACTIC ACID, ED: Lactic Acid, Venous: 1.8 mmol/L (ref 0.5–2.0)

## 2015-02-08 MED ORDER — INSULIN ASPART 100 UNIT/ML ~~LOC~~ SOLN
0.0000 [IU] | Freq: Three times a day (TID) | SUBCUTANEOUS | Status: DC
  Administered 2015-02-09 (×2): 2 [IU] via SUBCUTANEOUS

## 2015-02-08 MED ORDER — ALBUTEROL SULFATE (2.5 MG/3ML) 0.083% IN NEBU: 5.0000 mg | INHALATION_SOLUTION | Freq: Once | RESPIRATORY_TRACT | Status: DC

## 2015-02-08 MED ORDER — POTASSIUM CHLORIDE CRYS ER 20 MEQ PO TBCR
40.0000 meq | EXTENDED_RELEASE_TABLET | Freq: Two times a day (BID) | ORAL | Status: DC
  Administered 2015-02-08 – 2015-02-09 (×3): 40 meq via ORAL
  Filled 2015-02-08 (×5): qty 2

## 2015-02-08 MED ORDER — HYDROCOD POLST-CHLORPHEN POLST 10-8 MG/5ML PO LQCR: 5.0000 mL | Freq: Two times a day (BID) | ORAL | Status: DC | PRN

## 2015-02-08 MED ORDER — ALBUTEROL SULFATE (2.5 MG/3ML) 0.083% IN NEBU
2.5000 mg | INHALATION_SOLUTION | Freq: Three times a day (TID) | RESPIRATORY_TRACT | Status: DC
  Administered 2015-02-08 – 2015-02-10 (×5): 2.5 mg via RESPIRATORY_TRACT
  Filled 2015-02-08 (×5): qty 3

## 2015-02-08 MED ORDER — SODIUM CHLORIDE 0.9 % IJ SOLN: 3.0000 mL | INTRAMUSCULAR | Status: DC | PRN

## 2015-02-08 MED ORDER — FLUTICASONE PROPIONATE 50 MCG/ACT NA SUSP
2.0000 | Freq: Every day | NASAL | Status: DC
  Administered 2015-02-09: 2 via NASAL
  Filled 2015-02-08: qty 16

## 2015-02-08 MED ORDER — FUROSEMIDE 10 MG/ML IJ SOLN
40.0000 mg | Freq: Every day | INTRAMUSCULAR | Status: DC
  Administered 2015-02-09 – 2015-02-10 (×2): 40 mg via INTRAVENOUS
  Filled 2015-02-08 (×2): qty 4

## 2015-02-08 MED ORDER — PANTOPRAZOLE SODIUM 40 MG PO TBEC
40.0000 mg | DELAYED_RELEASE_TABLET | Freq: Every day | ORAL | Status: DC
  Administered 2015-02-08 – 2015-02-09 (×2): 40 mg via ORAL
  Filled 2015-02-08 (×3): qty 1

## 2015-02-08 MED ORDER — IPRATROPIUM BROMIDE 0.02 % IN SOLN: 0.5000 mg | Freq: Once | RESPIRATORY_TRACT | Status: DC

## 2015-02-08 MED ORDER — ASPIRIN 81 MG PO CHEW
81.0000 mg | CHEWABLE_TABLET | Freq: Every day | ORAL | Status: DC
  Administered 2015-02-08 – 2015-02-09 (×2): 81 mg via ORAL
  Filled 2015-02-08 (×3): qty 1

## 2015-02-08 MED ORDER — GABAPENTIN 300 MG PO CAPS
300.0000 mg | ORAL_CAPSULE | Freq: Three times a day (TID) | ORAL | Status: DC
  Administered 2015-02-08 – 2015-02-10 (×5): 300 mg via ORAL
  Filled 2015-02-08 (×7): qty 1

## 2015-02-08 MED ORDER — HEPARIN SODIUM (PORCINE) 5000 UNIT/ML IJ SOLN
5000.0000 [IU] | Freq: Three times a day (TID) | INTRAMUSCULAR | Status: DC
  Filled 2015-02-08 (×7): qty 1

## 2015-02-08 MED ORDER — SODIUM CHLORIDE 0.9 % IJ SOLN
3.0000 mL | Freq: Two times a day (BID) | INTRAMUSCULAR | Status: DC
  Administered 2015-02-08 – 2015-02-09 (×3): 3 mL via INTRAVENOUS

## 2015-02-08 MED ORDER — CARVEDILOL 6.25 MG PO TABS
6.2500 mg | ORAL_TABLET | Freq: Two times a day (BID) | ORAL | Status: DC
  Administered 2015-02-09 (×2): 6.25 mg via ORAL
  Filled 2015-02-08 (×3): qty 1

## 2015-02-08 MED ORDER — BENZONATATE 100 MG PO CAPS
200.0000 mg | ORAL_CAPSULE | Freq: Two times a day (BID) | ORAL | Status: DC
  Administered 2015-02-08 – 2015-02-09 (×3): 200 mg via ORAL
  Filled 2015-02-08 (×5): qty 2

## 2015-02-08 MED ORDER — ALBUTEROL SULFATE (2.5 MG/3ML) 0.083% IN NEBU: 2.5000 mg | INHALATION_SOLUTION | RESPIRATORY_TRACT | Status: DC | PRN

## 2015-02-08 MED ORDER — ONDANSETRON HCL 4 MG/2ML IJ SOLN: 4.0000 mg | Freq: Four times a day (QID) | INTRAMUSCULAR | Status: DC | PRN

## 2015-02-08 MED ORDER — ACETAMINOPHEN 325 MG PO TABS: 650.0000 mg | ORAL_TABLET | ORAL | Status: DC | PRN

## 2015-02-08 MED ORDER — LOSARTAN POTASSIUM 25 MG PO TABS
25.0000 mg | ORAL_TABLET | Freq: Every day | ORAL | Status: DC
  Administered 2015-02-08 – 2015-02-09 (×2): 25 mg via ORAL
  Filled 2015-02-08 (×3): qty 1

## 2015-02-08 MED ORDER — SODIUM CHLORIDE 0.9 % IV SOLN: 250.0000 mL | INTRAVENOUS | Status: DC | PRN

## 2015-02-08 MED ORDER — FUROSEMIDE 10 MG/ML IJ SOLN
60.0000 mg | Freq: Once | INTRAMUSCULAR | Status: AC
  Administered 2015-02-08: 60 mg via INTRAVENOUS
  Filled 2015-02-08: qty 6

## 2015-02-08 NOTE — ED Provider Notes (Signed)
Patient presented to the ER with shortness of breath. Patient does report a history of congestive heart failure. He reports that he ran out of his Lasix several days ago. Since then he has been experiencing increasing shortness of breath, dyspnea on exertion with swelling of the legs.  Face to face Exam: HEENT - PERRLA Lungs - CTAB Heart - RRR, no M/R/G Abd - S/NT/ND Neuro - alert, oriented x3  Plan: Patient very dyspneic with minimal exertion. He did drop to 88% on room air while walking here in the ER. He will require inpatient diuresis.   Gilda Creasehristopher J Brendy Ficek, MD 02/08/15 [phone]

## 2015-02-08 NOTE — ED Provider Notes (Signed)
CSN: [id number]     Arrival date & time 02/08/15  1434 History   First MD Initiated Contact with Patient 02/08/15 1643     Chief Complaint  Patient presents with  . Shortness of Breath  . Cough     (Consider location/radiation/quality/duration/timing/severity/associated sxs/prior Treatment) HPI Nat MathJames Hamilton is a 74 y.o. male with hx of HTN, CVA, DM, CHF, presents to ED with complaint of cough and shortness of breath. States his symptoms started today. Reports symptoms worsened with laying down flat. Cough is non productive. Admits to increased swelling in bilateral feet. Denies any chest pain. No nausea or vomiting. No back pain. Pt reports that he was admitted for similar symptoms 2 months ago. States he ran out of lasix 3 days ago. Denies any fever or chills. No medications taken for cough or sob. No other complaints.   Past Medical History  Diagnosis Date  . Stroke 2010    "memory problems since"  . Hypertension   . Type II diabetes mellitus   . Chronic systolic CHF (congestive heart failure)     a. 2D ECHO 08/13/14: EF 40%, diffuse hypokinesis possibly worse in the inferior wall. Mild MR, mild LA dilation, PA pressure 50. Trivial pericardial effusion.  . Acute systolic CHF (congestive heart failure), NYHA class 4 08/12/2014   Past Surgical History  Procedure Laterality Date  . Carotid endarterectomy Right 2010   Family History  Problem Relation Age of Onset  . Kidney disease Mother    History  Substance Use Topics  . Smoking status: Current Every Day Smoker -- 0.50 packs/day for 56 years    Types: Cigarettes  . Smokeless tobacco: Never Used  . Alcohol Use: No     Comment: "quit drinking in 1976    Review of Systems  Constitutional: Negative for fever and chills.  Respiratory: Positive for cough and shortness of breath. Negative for chest tightness.   Cardiovascular: Positive for leg swelling. Negative for chest pain and palpitations.  Gastrointestinal: Negative for nausea,  vomiting, abdominal pain, diarrhea and abdominal distention.  Genitourinary: Negative for dysuria, urgency, frequency and hematuria.  Musculoskeletal: Negative for myalgias, arthralgias, neck pain and neck stiffness.  Skin: Negative for rash.  Allergic/Immunologic: Negative for immunocompromised state.  Neurological: Negative for dizziness, weakness, light-headedness, numbness and headaches.  All other systems reviewed and are negative.     Allergies  Review of patient's allergies indicates no known allergies.  Home Medications   Prior to Admission medications   Medication Sig Start Date End Date Taking? Authorizing Provider  aspirin 81 MG chewable tablet Chew 1 tablet (81 mg total) by mouth daily. 12/15/14  Yes Tiffany Netta CedarsS Noel, PA-C  carvedilol (COREG) 6.25 MG tablet Take 1 tablet (6.25 mg total) by mouth 2 (two) times daily with a meal. 12/15/14  Yes Tiffany Netta CedarsS Noel, PA-C  collagenase (SANTYL) ointment Apply topically daily. 12/14/14  Yes Jerald KiefStephen K Chiu, MD  furosemide (LASIX) 40 MG tablet Take 1 tablet (40 mg total) by mouth daily. 12/15/14  Yes Tiffany Netta CedarsS Noel, PA-C  gabapentin (NEURONTIN) 300 MG capsule Take 1 capsule (300 mg total) by mouth 3 (three) times daily. 08/14/14  Yes Richarda OverlieNayana Abrol, MD  glimepiride (AMARYL) 2 MG tablet Take 1 tablet (2 mg total) by mouth daily with breakfast. 11/23/14  Yes Joseph ArtJessica U Vann, DO  lisinopril (PRINIVIL,ZESTRIL) 5 MG tablet Take 1 tablet (5 mg total) by mouth daily. 12/15/14  Yes Tiffany Netta CedarsS Noel, PA-C  potassium chloride SA (K-DUR,KLOR-CON) 20 MEQ tablet  Take 2 tablets (40 mEq total) by mouth 2 (two) times daily. 12/15/14  Yes Tiffany Netta Cedars, PA-C  albuterol (PROVENTIL HFA;VENTOLIN HFA) 108 (90 BASE) MCG/ACT inhaler Inhale 2 puffs into the lungs every 6 (six) hours as needed for wheezing or shortness of breath.    Historical Provider, MD  clindamycin (CLEOCIN) 150 MG capsule Take 3 capsules (450 mg total) by mouth 3 (three) times daily. 11/28/14   Mellody Drown,  PA-C  HYDROcodone-acetaminophen (NORCO/VICODIN) 5-325 MG per tablet Take 1 tablet by mouth every 6 (six) hours as needed for severe pain. 11/23/14   Jessica U Vann, DO   BP 153/95 mmHg  Pulse 104  Temp(Src) 97.6 F (36.4 C) (Axillary)  Resp 20  Ht 5' 9.5" (1.765 m)  Wt 159 lb (72.122 kg)  BMI 23.15 kg/m2  SpO2 97% Physical Exam  Constitutional: He is oriented to person, place, and time. He appears well-developed and well-nourished. No distress.  HENT:  Head: Normocephalic and atraumatic.  Eyes: Conjunctivae and EOM are normal. Pupils are equal, round, and reactive to light.  Neck: Neck supple.  Cardiovascular: Normal rate, regular rhythm and normal heart sounds.   Pulmonary/Chest: Effort normal. No respiratory distress. He has no wheezes. He has no rales. He exhibits no tenderness.  Abdominal: Soft. Bowel sounds are normal. He exhibits no distension. There is no tenderness. There is no rebound.  Musculoskeletal:  Swelling to bilateral ankles and feet. There is a dry, scabbed wound to the left lateral foot, nontender. No drainage. Blackened discoloration.  Neurological: He is alert and oriented to person, place, and time.  Skin: Skin is warm and dry.  Nursing note and vitals reviewed.   ED Course  Procedures (including critical care time) Labs Review Labs Reviewed  BASIC METABOLIC PANEL - Abnormal; Notable for the following:    Sodium 134 (*)    Glucose, Bld 177 (*)    GFR calc non Af Amer 63 (*)    GFR calc Af Amer 73 (*)    All other components within normal limits  BRAIN NATRIURETIC PEPTIDE - Abnormal; Notable for the following:    B Natriuretic Peptide 1355.4 (*)    All other components within normal limits  CBC WITH DIFFERENTIAL/PLATELET - Abnormal; Notable for the following:    Hemoglobin 12.6 (*)    HCT 38.2 (*)    RDW 15.7 (*)    All other components within normal limits  I-STAT TROPOININ, ED  I-STAT CG4 LACTIC ACID, ED    Imaging Review Dg Chest 2  View  02/08/2015   CLINICAL DATA:  Cough and shortness of breath for 1 day. Swelling and numbness of the feet.  EXAM: CHEST  2 VIEW  COMPARISON:  12/11/2014  FINDINGS: Bibasilar airspace opacities obscuring the hemidiaphragms, similar to prior. Mild enlargement of the cardiopericardial silhouette moderate size bilateral pleural effusions.  Thoracic spondylosis.  Emphysema.  IMPRESSION: 1. Similar appearance of cardiomegaly and moderate size bilateral pleural effusions with passive atelectasis compared to 12/11/14.   Electronically Signed   By: Gaylyn Rong M.D.   On: 02/08/2015 15:54     EKG Interpretation None      MDM   Final diagnoses:  CHF exacerbation    patient with history of CHF, here with shortness of breath that worsened today. Also complaining of cough. Patient does have some swelling in his feet, and noticed 6 pound weight gain in the last 2 months. Labs and chest x-ray ordered. Lasix order.   The patient's labs  are unremarkable except for elevated BNP level. Chest x-ray unchanged from the last with moderate pleural effusions. Patient was ambulated, and dropped her oxygen saturation into the 80s on room air. Given hypoxia, history of CHF with EF of 40%, patient will need admission for diuresis and further treatment of his shortness of breath.  5:40 PM Spoke with [facility] hospitalist. Will admit.   Filed Vitals:   02/08/15 1458 02/08/15 1655  BP: 135/83 153/95  Pulse: 106 104  Temp: 97.6 F (36.4 C)   TempSrc: Axillary   Resp: 20 20  Height: 5' 9.5" (1.765 m)   Weight: 159 lb (72.122 kg)   SpO2: 93% 97%     Jaynie Crumble, PA-C 02/09/15 0059  Gilda Crease, MD 02/11/15 1219

## 2015-02-08 NOTE — Progress Notes (Signed)
Report received from ED at 1815 and pt arrived to the unit via stretcher at 1830. Pt A&O x4; transferred from strecher to bed with one person assist; oriented to the room and unit; VSS; telemetry applied and verified; non pitting edema BLE; dry and flaky feet; pt has a dry black ulcer to left foot with no drainage or dsg. Pt c/o pins and needles pain to BLE stating " they hurting because of the swelling in my legs". Pt in bed comfortably with call light within reach watching TV. Reported off to incoming RN. Arabella MerlesP. Amo Revonda Menter RN.

## 2015-02-08 NOTE — ED Notes (Signed)
Pt c/o cough and SOB; pt speaking complete sentences with cough noted; pt noted to have cold finger tips

## 2015-02-08 NOTE — H&P (Addendum)
PATIENT DETAILS Name: Russell Hamilton Age: 74 y.o. Sex: male Date of Birth: 1941-02-11 Admit Date: 02/08/2015 PCP:No PCP Per Patient   CHIEF COMPLAINT:  Shortness of breath, cough for past 3 days  HPI: Russell Hamilton is a 74 y.o. male with a Past Medical History of chronic systolic heart failure, hypertension, type 2 diabetes who presents today with the above noted complaint. Per patient, he ran out of his Lasix approximately 1 week back. For the past 3 days he is being complaining of shortness of breath. Shortness of breath is mostly on exertion, and also on lying flat. For the past few days patient has had trouble sleeping due to orthopnea and PND. He now uses 2 pillows, and has to wake up repeatedly because of shortness of breath. He also complains of this persistent dry cough that has been going on for the past few days. No history of hemoptysis or fever. No chest pain. He also complains of some intermittent dizziness upon ambulation. Patient presented to the emergency room with these complaints, was found to be 5 pounds over his dry weight (155 pounds in the ED, claims he was 150 last week). Hospitalist service was asked to admit this patient for further evaluation and treatment No nausea, vomiting, diarrhea, abdominal pain, headache or fever. Does complain of some intermittent heartburn with belching, and some nasal congestion   ALLERGIES:  No Known Allergies  PAST MEDICAL HISTORY: Past Medical History  Diagnosis Date  . Stroke 2010    "memory problems since"  . Hypertension   . Type II diabetes mellitus   . Chronic systolic CHF (congestive heart failure)     a. 2D ECHO 08/13/14: EF 40%, diffuse hypokinesis possibly worse in the inferior wall. Mild MR, mild LA dilation, PA pressure 50. Trivial pericardial effusion.  . Acute systolic CHF (congestive heart failure), NYHA class 4 08/12/2014    PAST SURGICAL HISTORY: Past Surgical History  Procedure Laterality Date  . Carotid  endarterectomy Right 2010    MEDICATIONS AT HOME: Prior to Admission medications   Medication Sig Start Date End Date Taking? Authorizing Provider  aspirin 81 MG chewable tablet Chew 1 tablet (81 mg total) by mouth daily. 12/15/14  Yes Tiffany Netta Cedars, PA-C  carvedilol (COREG) 6.25 MG tablet Take 1 tablet (6.25 mg total) by mouth 2 (two) times daily with a meal. 12/15/14  Yes Tiffany Netta Cedars, PA-C  collagenase (SANTYL) ointment Apply topically daily. 12/14/14  Yes Jerald Kief, MD  furosemide (LASIX) 40 MG tablet Take 1 tablet (40 mg total) by mouth daily. 12/15/14  Yes Tiffany Netta Cedars, PA-C  gabapentin (NEURONTIN) 300 MG capsule Take 1 capsule (300 mg total) by mouth 3 (three) times daily. 08/14/14  Yes Richarda Overlie, MD  glimepiride (AMARYL) 2 MG tablet Take 1 tablet (2 mg total) by mouth daily with breakfast. 11/23/14  Yes Joseph Art, DO  lisinopril (PRINIVIL,ZESTRIL) 5 MG tablet Take 1 tablet (5 mg total) by mouth daily. 12/15/14  Yes Tiffany Netta Cedars, PA-C  potassium chloride SA (K-DUR,KLOR-CON) 20 MEQ tablet Take 2 tablets (40 mEq total) by mouth 2 (two) times daily. 12/15/14  Yes Tiffany Netta Cedars, PA-C  albuterol (PROVENTIL HFA;VENTOLIN HFA) 108 (90 BASE) MCG/ACT inhaler Inhale 2 puffs into the lungs every 6 (six) hours as needed for wheezing or shortness of breath.    Historical Provider, MD  clindamycin (CLEOCIN) 150 MG capsule Take 3 capsules (450 mg total) by mouth 3 (three)  times daily. 11/28/14   Mellody DrownLauren Parker, PA-C  HYDROcodone-acetaminophen (NORCO/VICODIN) 5-325 MG per tablet Take 1 tablet by mouth every 6 (six) hours as needed for severe pain. 11/23/14   Joseph ArtJessica U Vann, DO    FAMILY HISTORY: Family History  Problem Relation Age of Onset  . Kidney disease Mother     SOCIAL HISTORY:  reports that he has been smoking Cigarettes.  He has a 28 pack-year smoking history. He has never used smokeless tobacco. He reports that he does not drink alcohol or use illicit drugs.  REVIEW OF SYSTEMS:    Constitutional:   No  weight loss, night sweats,  Fevers, chills, fatigue.  HEENT:    No headaches, Difficulty swallowing,Tooth/dental problems,Sore throat,   Cardio-vascular: No chest pain,anasarca, palpitations  GI:  No heartburn, indigestion, abdominal pain, nausea, vomiting, diarrhea, change in bowel habits, loss of appetite  Resp: No shortness of breath  rest.  No excess mucus, No coughing up of blood.No wheezing.No chest wall deformity  Skin:  no rash or lesions.  GU:  no dysuria, change in color of urine, no urgency or frequency.  No flank pain.  Musculoskeletal: No joint pain or swelling.  No decreased range of motion.  No back pain.  Psych: No change in mood or affect. No depression or anxiety.  No memory loss.   PHYSICAL EXAM: Blood pressure 146/98, pulse 97, temperature 97.6 F (36.4 C), temperature source Axillary, resp. rate 26, height 5' 9.5" (1.765 m), weight 70.353 kg (155 lb 1.6 oz), SpO2 94 %.  General appearance :Awake, alert, not in any distress. Speech Clear. Not toxic Looking HEENT: Atraumatic and Normocephalic, pupils equally reactive to light and accomodation Neck: supple, no JVD. No cervical lymphadenopathy.  Chest:Good air entry bilaterally, few bibasilar rales CVS: S1 S2 regular, no murmurs.  Abdomen: Bowel sounds present, Non tender and not distended with no gaurding, rigidity or rebound. Extremities: B/L Lower Ext shows no edema, both legs are warm to touch. Chronic calluses with dry ulcer in bilateral medial malleolus Neurology: Awake alert, and oriented X 3, CN II-XII intact, Non focal Skin:No Rash Wounds:N/A  LABS ON ADMISSION:   Recent Labs  02/08/15 1502  NA 134*  K 4.0  CL 102  CO2 21  GLUCOSE 177*  BUN 11  CREATININE 1.14  CALCIUM 8.8   No results for input(s): AST, ALT, ALKPHOS, BILITOT, PROT, ALBUMIN in the last 72 hours. No results for input(s): LIPASE, AMYLASE in the last 72 hours.  Recent Labs  02/08/15 1502   WBC 5.0  NEUTROABS 2.9  HGB 12.6*  HCT 38.2*  MCV 90.3  PLT 171   No results for input(s): CKTOTAL, CKMB, CKMBINDEX, TROPONINI in the last 72 hours. No results for input(s): DDIMER in the last 72 hours. Invalid input(s): POCBNP   RADIOLOGIC STUDIES ON ADMISSION: Dg Chest 2 View  02/08/2015   CLINICAL DATA:  Cough and shortness of breath for 1 day. Swelling and numbness of the feet.  EXAM: CHEST  2 VIEW  COMPARISON:  12/11/2014  FINDINGS: Bibasilar airspace opacities obscuring the hemidiaphragms, similar to prior. Mild enlargement of the cardiopericardial silhouette moderate size bilateral pleural effusions.  Thoracic spondylosis.  Emphysema.  IMPRESSION: 1. Similar appearance of cardiomegaly and moderate size bilateral pleural effusions with passive atelectasis compared to 12/11/14.   Electronically Signed   By: Gaylyn RongWalter  Liebkemann M.D.   On: 02/08/2015 15:54     EKG: Independently reviewed. Normal sinus rhythm  ASSESSMENT AND PLAN: Present on Admission:  .  Acute systolic CHF (congestive heart failure): 5 pound weight gain in the past one week, with mild exertional dyspnea and new two-pillow orthopnea-but with no overwhelming edema/volume on exam. Suspect some mild congestive heart failure with decompensation, will start on IV Lasix, check daily weights, strict intake and output. Continue Coreg, because of persistent cough would discontinue lisinopril and put him on losartan. Suspect compliance to be an issue here. Per prior cardiology documentation, has refused ischemic workup to evaluate etiology of chronic systolic heart failure.  Marland Kitchen HTN (hypertension): Continue Coreg, would change lisinopril to losartan given dry cough. Follow and adjust accordingly   . Cough, persistent: Has a persistent dry cough over the past few days, no pneumonia seen on chest x-ray, lungs except mostly clear for a few bibasilar rales. Has history of ongoing tobacco use. We will change from lisinopril to losartan,  place on PPI, Flonase, and antitussive medications as needed. Follow, if no improvement, may need low-dose Ultram. He needs to stop smoking and he has been counseled.  . Foot ulcer: Wound care evaluation   . Type 2 diabetes: Hold oral hypoglycemics, place on SSI   . Tobacco abuse: Counseled extensively   Further plan will depend as patient's clinical course evolves and further radiologic and laboratory data become available. Patient will be monitored closely.  Above noted plan was discussed with patient, he was in agreement.   DVT Prophylaxis: Prophylactic  Heparin  Code Status: Full Code   Total time spent for admission equals 45 minutes.  [facility] Pager [phone]  If 7PM-7AM, please contact night-coverage [email or web address] Password [facility] 02/08/2015, 6:05 PM

## 2015-02-09 DIAGNOSIS — Z720 Tobacco use: Secondary | ICD-10-CM

## 2015-02-09 LAB — GLUCOSE, CAPILLARY
GLUCOSE-CAPILLARY: 179 mg/dL — AB (ref 70–99)
GLUCOSE-CAPILLARY: 141 mg/dL — AB (ref 70–99)
Glucose-Capillary: 196 mg/dL — ABNORMAL HIGH (ref 70–99)
Glucose-Capillary: 143 mg/dL — ABNORMAL HIGH (ref 70–99)

## 2015-02-09 LAB — BASIC METABOLIC PANEL
Anion gap: 8 (ref 5–15)
BUN: 14 mg/dL (ref 6–23)
CO2: 28 mmol/L (ref 19–32)
Calcium: 8.7 mg/dL (ref 8.4–10.5)
Chloride: 99 mmol/L (ref 96–112)
Creatinine, Ser: 1.25 mg/dL (ref 0.50–1.35)
GFR calc Af Amer: 65 mL/min — ABNORMAL LOW (ref >90)
GFR calc non Af Amer: 56 mL/min — ABNORMAL LOW (ref >90)
GLUCOSE: 196 mg/dL — AB (ref 70–99)
POTASSIUM: 4 mmol/L (ref 3.5–5.1)
Sodium: 135 mmol/L (ref 135–145)

## 2015-02-09 MED ORDER — AZITHROMYCIN 500 MG PO TABS
500.0000 mg | ORAL_TABLET | Freq: Every day | ORAL | Status: DC
  Administered 2015-02-09: 500 mg via ORAL
  Filled 2015-02-09 (×2): qty 1

## 2015-02-09 MED ORDER — TRAMADOL HCL 50 MG PO TABS
50.0000 mg | ORAL_TABLET | Freq: Four times a day (QID) | ORAL | Status: DC | PRN
  Administered 2015-02-09 – 2015-02-10 (×3): 50 mg via ORAL
  Filled 2015-02-09 (×3): qty 1

## 2015-02-09 MED ORDER — HYDROCOD POLST-CHLORPHEN POLST 10-8 MG/5ML PO LQCR
5.0000 mL | Freq: Two times a day (BID) | ORAL | Status: DC
  Administered 2015-02-09 (×2): 5 mL via ORAL
  Filled 2015-02-09 (×2): qty 5

## 2015-02-09 NOTE — Progress Notes (Signed)
PATIENT DETAILS Name: Russell Hamilton Age: 74 y.o. Sex: male Date of Birth: [birth year] Admit Date: 02/08/2015 Admitting Physician Dewayne Shorter Levora Dredge, MD PCP:No PCP Per Patient  Subjective: Orthopnea better. Still with persistent coughing spells.  Assessment/Plan: Active Problems:   Acute systolic CHF (congestive heart failure): Presented with 5 pound weight gain in the past one week, with mild exertional dyspnea and new two-pillow orthopnea-but with no overwhelming edema/volume on exam.Started on IV Lasix, with some improvement in Orthopnea.Will continue with IV lasix today, and switch to oral tomorrow. Continue Coreg and Losartan. Follow.   Cough: persistent coughing spells-no infiltrate on CXR. Continue anti-tussives, start Tramadol.Will start empiric Zithromax to cover atypicals.Follow.  HTN (hypertension): Continue Coreg, and losartan. BP stable. Follow.  Type 2 diabetes: CBG's stable, continue SSI. Will resume oral hypoglycemics on discharrge.  Tobacco abuse: Counseled extensively   Foot ulcer:appreciate wound care recommendations-continue Foam dressing to left outer foot and bandaid to left small toe to protect and promote healing.  Disposition: Remain inpatient  Antibiotics:  See below   Anti-infectives    Start     Dose/Rate Route Frequency Ordered Stop   02/09/15 1000  azithromycin [facility]) tablet 500 mg     500 mg Oral Daily 02/09/15 0809 02/12/15 0959      DVT Prophylaxis: Prophylactic Heparin   Code Status: Full code   Family Communication None  Procedures:  None  CONSULTS:  None  Time spent 40 minutes-which includes 50% of the time with face-to-face with patient/ family and coordinating care related to the above assessment and plan.  MEDICATIONS: Scheduled Meds: . albuterol  2.5 mg Nebulization TID  . aspirin  81 mg Oral Daily  . azithromycin  500 mg Oral Daily  . benzonatate  200 mg Oral BID  . carvedilol  6.25 mg Oral BID WC  .  chlorpheniramine-HYDROcodone  5 mL Oral Q12H  . fluticasone  2 spray Each Nare Daily  . furosemide  40 mg Intravenous Daily  . gabapentin  300 mg Oral TID  . heparin  5,000 Units Subcutaneous 3 times per day  . insulin aspart  0-9 Units Subcutaneous TID WC  . losartan  25 mg Oral Daily  . pantoprazole  40 mg Oral Daily  . potassium chloride SA  40 mEq Oral BID  . sodium chloride  3 mL Intravenous Q12H   Continuous Infusions:  PRN Meds:.sodium chloride, acetaminophen, albuterol, ondansetron (ZOFRAN) IV, sodium chloride, traMADol    PHYSICAL EXAM: Vital signs in last 24 hours: Filed Vitals:   02/09/15 0131 02/09/15 0634 02/09/15 0802 02/09/15 0948  BP: 114/83 125/90  128/70  Pulse: 96 89  96  Temp: 97.8 F (36.6 C) 97.4 F (36.3 C)    TempSrc: Oral Oral    Resp: Height:      Weight:  67.405 kg (148 lb 9.6 oz)    SpO2: 93% 98% 94% 94%    Weight change:  Filed Weights   02/08/15 1747 02/08/15 1845 02/09/15 0634  Weight: 70.353 kg (155 lb 1.6 oz) 69.219 kg (152 lb 9.6 oz) 67.405 kg (148 lb 9.6 oz)   Body mass index is 21.93 kg/(m^2).   Gen Exam: Awake and alert with clear speech Neck: Supple, No JVD.   Chest: B/L Clear.   CVS: S1 S2 Regular, no murmurs.  Abdomen: soft, BS +, non tender, non distended.  Extremities: no edema, lower extremities warm to touch. Neurologic: Non Focal.  Skin: No Rash.   Wounds: N/A.    Intake/Output from previous day:  Intake/Output Summary (Last 24 hours) at 02/09/15 1158 Last data filed at 02/09/15 1119  Gross per 24 hour  Intake    600 ml  Output   2275 ml  Net  -1675 ml     LAB RESULTS: CBC  Recent Labs Lab 02/08/15 1502  WBC 5.0  HGB 12.6*  HCT 38.2*  PLT 171  MCV 90.3  MCH 29.8  MCHC 33.0  RDW 15.7*  LYMPHSABS 1.5  MONOABS 0.5  EOSABS 0.1  BASOSABS 0.0    Chemistries   Recent Labs Lab 02/08/15 1502 02/09/15 0425  NA 134* 135  K 4.0 4.0  CL 102 99  CO2 21 28  GLUCOSE 177* 196*  BUN 11 14   CREATININE 1.14 1.25  CALCIUM 8.8 8.7    CBG:  Recent Labs Lab 02/08/15 2133 02/09/15 0643 02/09/15 1118  GLUCAP 184* 196* 143*    GFR Estimated Creatinine Clearance: 51.7 mL/min (by C-G formula based on Cr of 1.25).  Coagulation profile No results for input(s): INR, PROTIME in the last 168 hours.  Cardiac Enzymes No results for input(s): CKMB, TROPONINI, MYOGLOBIN in the last 168 hours.  Invalid input(s): CK  Invalid input(s): POCBNP No results for input(s): DDIMER in the last 72 hours. No results for input(s): HGBA1C in the last 72 hours. No results for input(s): CHOL, HDL, LDLCALC, TRIG, CHOLHDL, LDLDIRECT in the last 72 hours. No results for input(s): TSH, T4TOTAL, T3FREE, THYROIDAB in the last 72 hours.  Invalid input(s): FREET3 No results for input(s): VITAMINB12, FOLATE, FERRITIN, TIBC, IRON, RETICCTPCT in the last 72 hours. No results for input(s): LIPASE, AMYLASE in the last 72 hours.  Urine Studies No results for input(s): UHGB, CRYS in the last 72 hours.  Invalid input(s): UACOL, UAPR, USPG, UPH, UTP, UGL, UKET, UBIL, UNIT, UROB, ULEU, UEPI, UWBC, URBC, UBAC, CAST, UCOM, BILUA  MICROBIOLOGY: No results found for this or any previous visit (from the past 240 hour(s)).  RADIOLOGY STUDIES/RESULTS: Dg Chest 2 View  02/08/2015   CLINICAL DATA:  Cough and shortness of breath for 1 day. Swelling and numbness of the feet.  EXAM: CHEST  2 VIEW  COMPARISON:  12/11/2014  FINDINGS: Bibasilar airspace opacities obscuring the hemidiaphragms, similar to prior. Mild enlargement of the cardiopericardial silhouette moderate size bilateral pleural effusions.  Thoracic spondylosis.  Emphysema.  IMPRESSION: 1. Similar appearance of cardiomegaly and moderate size bilateral pleural effusions with passive atelectasis compared to 12/11/14.   Electronically Signed   By: Gaylyn RongWalter  Liebkemann M.D.   On: 02/08/2015 15:54    [name],Porscha Axley, MD  [facility] Pager:[phone]  If  7PM-7AM, please contact night-coverage [email or web address] Password [email or web address] 02/09/2015, 11:58 AM   LOS: 1 day

## 2015-02-09 NOTE — Care Management Note (Signed)
    Page 1 of 1   02/10/2015     4:41:54 PM CARE MANAGEMENT NOTE 02/10/2015  Patient:  [name],Nazier   Account Number:  [id number]  Date Initiated:  02/09/2015  Documentation initiated by:  Maurya Nethery  Subjective/Objective Assessment:   Pt adm on 02/08/15 with CHF.  PTA, pt lives at home alone. He has supportive niece.     Action/Plan:   Will follow for dc needs as pt progresses.   Anticipated DC Date:  02/12/2015   Anticipated DC Plan:  HOME W HOME HEALTH SERVICES      DC Planning Services  CM consult  PCP issues      Choice offered to / List presented to:             Status of service:  Completed, signed off Medicare Important Message given?  NA - LOS <3 / Initial given by admissions (If response is "NO", the following Medicare IM given date fields will be blank) Date Medicare IM given:   Medicare IM given by:   Date Additional Medicare IM given:   Additional Medicare IM given by:    Discharge Disposition:  HOME/SELF CARE  Per UR Regulation:  Reviewed for med. necessity/level of care/duration of stay  If discussed at Long Length of Stay Meetings, dates discussed:    Comments:  02/10/15 Sidney AceJulie Ayan Heffington, RN, BSN [phone] Pt for dc home today.  States he has no PCP; states insurance is paying for his meds currently.  [facility], though he has no-showed to all past appts made for him.  Follow up appt made at health clinic--appt info put on AVS.  Pt states he will go to appt this time.

## 2015-02-09 NOTE — Consult Note (Addendum)
WOC wound consult note Reason for Consult: Consult requested for BLE.  Pt states he has chronic wounds which have been present several months; they started as a burn from a heating pad and have greatly improved according to him.  Wound type: Left outer foot with full thickness wound; tightly adhered eschar, 4X5cm, no odor, drainage, or fluctuance. Left small toe with dry scab which removes easily, revealing partial thickness wound.8X.3X.1cm, pink and dry without odor or drainage. Left inner ankle with dry scab which removes easily, revealing scar tissue .3X.3cm, pink and dry without odor or drainage. Right inner ankle with dry scab which removes easily, revealing scar tissue .3X.3Xcm, pink and dry without odor or drainage. Dressing procedure/placement/frequency: It is best practice to leave dry stable eschar intact to lower extremities.  Foam dressing to left outer foot and bandaid to left small toe to protect and promote healing.  Discussed plan of care with patient and he denies further questions. Please re-consult if further assistance is needed.  Thank-you,  Cammie Mcgeeawn Cherylanne Ardelean MSN, RN, CWOCN, [place]-AP, CNS [phone]

## 2015-02-10 LAB — BASIC METABOLIC PANEL
Anion gap: 7 (ref 5–15)
BUN: 18 mg/dL (ref 6–23)
CALCIUM: 8.4 mg/dL (ref 8.4–10.5)
CO2: 27 mmol/L (ref 19–32)
CREATININE: 1.28 mg/dL (ref 0.50–1.35)
Chloride: 100 mmol/L (ref 96–112)
GFR calc non Af Amer: 55 mL/min — ABNORMAL LOW (ref >90)
GFR, EST AFRICAN AMERICAN: 63 mL/min — AB (ref >90)
GLUCOSE: 138 mg/dL — AB (ref 70–99)
POTASSIUM: 4.6 mmol/L (ref 3.5–5.1)
Sodium: 134 mmol/L — ABNORMAL LOW (ref 135–145)

## 2015-02-10 LAB — GLUCOSE, CAPILLARY
GLUCOSE-CAPILLARY: 122 mg/dL — AB (ref 70–99)
Glucose-Capillary: 155 mg/dL — ABNORMAL HIGH (ref 70–99)

## 2015-02-10 MED ORDER — FLUTICASONE PROPIONATE 50 MCG/ACT NA SUSP: 2.0000 | Freq: Every day | NASAL | Status: DC

## 2015-02-10 MED ORDER — ASPIRIN 81 MG PO CHEW: 81.0000 mg | CHEWABLE_TABLET | Freq: Every day | ORAL | Status: DC

## 2015-02-10 MED ORDER — NICOTINE 21 MG/24HR TD PT24
21.0000 mg | MEDICATED_PATCH | Freq: Every day | TRANSDERMAL | Status: DC
  Filled 2015-02-10: qty 1

## 2015-02-10 MED ORDER — BENZONATATE 200 MG PO CAPS: 200.0000 mg | ORAL_CAPSULE | Freq: Two times a day (BID) | ORAL | Status: DC

## 2015-02-10 MED ORDER — PREDNISONE 20 MG PO TABS
40.0000 mg | ORAL_TABLET | Freq: Every day | ORAL | Status: DC
  Filled 2015-02-10: qty 2

## 2015-02-10 MED ORDER — PANTOPRAZOLE SODIUM 40 MG PO TBEC: 40.0000 mg | DELAYED_RELEASE_TABLET | Freq: Every day | ORAL | Status: DC

## 2015-02-10 MED ORDER — ALBUTEROL SULFATE HFA 108 (90 BASE) MCG/ACT IN AERS: 2.0000 | INHALATION_SPRAY | Freq: Four times a day (QID) | RESPIRATORY_TRACT | Status: DC | PRN

## 2015-02-10 MED ORDER — CARVEDILOL 6.25 MG PO TABS
6.2500 mg | ORAL_TABLET | Freq: Two times a day (BID) | ORAL | Status: DC
  Administered 2015-02-10: 6.25 mg via ORAL
  Filled 2015-02-10 (×3): qty 1

## 2015-02-10 MED ORDER — FUROSEMIDE 40 MG PO TABS: 40.0000 mg | ORAL_TABLET | Freq: Every day | ORAL | Status: DC

## 2015-02-10 MED ORDER — PREDNISONE 10 MG PO TABS: ORAL_TABLET | ORAL | Status: DC

## 2015-02-10 MED ORDER — LORATADINE 10 MG PO TABS: 10.0000 mg | ORAL_TABLET | Freq: Every day | ORAL | Status: DC

## 2015-02-10 MED ORDER — POTASSIUM CHLORIDE CRYS ER 20 MEQ PO TBCR: 40.0000 meq | EXTENDED_RELEASE_TABLET | Freq: Two times a day (BID) | ORAL | Status: DC

## 2015-02-10 MED ORDER — GABAPENTIN 300 MG PO CAPS: 300.0000 mg | ORAL_CAPSULE | Freq: Three times a day (TID) | ORAL | Status: DC

## 2015-02-10 MED ORDER — LOSARTAN POTASSIUM 25 MG PO TABS
25.0000 mg | ORAL_TABLET | Freq: Every day | ORAL | Status: DC
Start: 2015-02-10 — End: 2015-03-01

## 2015-02-10 MED ORDER — GLIMEPIRIDE 2 MG PO TABS: 2.0000 mg | ORAL_TABLET | Freq: Every day | ORAL | Status: DC

## 2015-02-10 MED ORDER — COLLAGENASE 250 UNIT/GM EX OINT: TOPICAL_OINTMENT | Freq: Every day | CUTANEOUS | Status: DC

## 2015-02-10 MED ORDER — NICOTINE 21 MG/24HR TD PT24: 21.0000 mg | MEDICATED_PATCH | Freq: Every day | TRANSDERMAL | Status: DC

## 2015-02-10 MED ORDER — TRAMADOL HCL 50 MG PO TABS: 50.0000 mg | ORAL_TABLET | Freq: Four times a day (QID) | ORAL | Status: DC | PRN

## 2015-02-10 MED ORDER — ALBUTEROL SULFATE HFA 108 (90 BASE) MCG/ACT IN AERS
1.0000 | INHALATION_SPRAY | RESPIRATORY_TRACT | Status: DC | PRN
  Filled 2015-02-10: qty 6.7

## 2015-02-10 MED ORDER — CARVEDILOL 6.25 MG PO TABS: 6.2500 mg | ORAL_TABLET | Freq: Two times a day (BID) | ORAL | Status: DC

## 2015-02-10 MED ORDER — HYDROCOD POLST-CHLORPHEN POLST 10-8 MG/5ML PO LQCR: 5.0000 mL | Freq: Two times a day (BID) | ORAL | Status: DC

## 2015-02-10 MED ORDER — AZITHROMYCIN 500 MG PO TABS
500.0000 mg | ORAL_TABLET | Freq: Every day | ORAL | Status: DC
Start: 2015-02-10 — End: 2015-03-01

## 2015-02-10 NOTE — Discharge Summary (Addendum)
PATIENT DETAILS Name: Russell Hamilton Age: 74 y.o. Sex: male Date of Birth: Jun 30, 1941 MRN: [id number]. Admitting Physician: Maretta Bees, MD PCP:No PCP Per Patient  Admit Date: 02/08/2015 Discharge date: 02/10/2015  Recommendations for Outpatient Follow-up:  1. Please check CBC and chemistries at next visit  2. Emphasize importance of compliance to medications  3. Persistent/chronic cough-currently on empiric prednisone, Zithromax, antihistamine, albuterol inhaler, and antitussives it'd please reassess at next visit.  4. Counsel regarding importance of stopping tobacco abuse 5. Respiratory virus panel pending please follow at next visit  PRIMARY DISCHARGE DIAGNOSIS:  Active Problems:   HTN (hypertension)   Type II diabetes mellitus   Foot ulcer   Acute systolic CHF (congestive heart failure)   Cough, persistent   Tobacco abuse      PAST MEDICAL HISTORY: Past Medical History  Diagnosis Date  . Hypertension   . Chronic systolic CHF (congestive heart failure)     a. 2D ECHO 08/13/14: EF 40%, diffuse hypokinesis possibly worse in the inferior wall. Mild MR, mild LA dilation, PA pressure 50. Trivial pericardial effusion.  . Acute systolic CHF (congestive heart failure), NYHA class 4 08/12/2014  . Type II diabetes mellitus   . GERD (gastroesophageal reflux disease)   . Stroke 2010    "memory problems since; right leg and left arm weakness since" (02/08/2015)  . Peripheral neuropathy     [name] 08/12/2014    DISCHARGE MEDICATIONS: Current Discharge Medication List    START taking these medications   Details  azithromycin (ZITHROMAX) 500 MG tablet Take 1 tablet (500 mg total) by mouth daily. Qty: 3 tablet, Refills: 0    benzonatate (TESSALON) 200 MG capsule Take 1 capsule (200 mg total) by mouth 2 (two) times daily. Qty: 10 capsule, Refills: 0    chlorpheniramine-HYDROcodone (TUSSIONEX) 10-8 MG/5ML LQCR Take 5 mLs by mouth every 12 (twelve) hours. Qty: 115 mL, Refills: 0      fluticasone (FLONASE) 50 MCG/ACT nasal spray Place 2 sprays into both nostrils daily. Qty: 9.9 g, Refills: 0    loratadine (CLARITIN) 10 MG tablet Take 1 tablet (10 mg total) by mouth daily. Qty: 30 tablet, Refills: 0    losartan (COZAAR) 25 MG tablet Take 1 tablet (25 mg total) by mouth daily. Qty: 30 tablet, Refills: 0    nicotine (NICODERM CQ - DOSED IN MG/24 HOURS) 21 mg/24hr patch Place 1 patch (21 mg total) onto the skin daily. Qty: 28 patch, Refills: 0    pantoprazole (PROTONIX) 40 MG tablet Take 1 tablet (40 mg total) by mouth daily. Qty: 30 tablet, Refills: 0    predniSONE (DELTASONE) 10 MG tablet Take 4 tablets (40 mg) daily for 2 days, then, Take 3 tablets (30 mg) daily for 2 days, then, Take 2 tablets (20 mg) daily for 2 days, then, Take 1 tablets (10 mg) daily for 1 days, then stop Qty: 19 tablet, Refills: 0    traMADol (ULTRAM) 50 MG tablet Take 1 tablet (50 mg total) by mouth every 6 (six) hours as needed for moderate pain (cough). Qty: 15 tablet, Refills: 0      CONTINUE these medications which have CHANGED   Details  albuterol (PROVENTIL HFA;VENTOLIN HFA) 108 (90 BASE) MCG/ACT inhaler Inhale 2 puffs into the lungs every 6 (six) hours as needed for wheezing or shortness of breath. Qty: 1 Inhaler, Refills: 0    aspirin 81 MG chewable tablet Chew 1 tablet (81 mg total) by mouth daily. Qty: 30 tablet, Refills: 3  carvedilol (COREG) 6.25 MG tablet Take 1 tablet (6.25 mg total) by mouth 2 (two) times daily with a meal. Qty: 60 tablet, Refills: 0    collagenase (SANTYL) ointment Apply topically daily. Qty: 15 g, Refills: 0    furosemide (LASIX) 40 MG tablet Take 1 tablet (40 mg total) by mouth daily. Qty: 30 tablet, Refills: 1    gabapentin (NEURONTIN) 300 MG capsule Take 1 capsule (300 mg total) by mouth 3 (three) times daily. Qty: 90 capsule, Refills: 0    glimepiride (AMARYL) 2 MG tablet Take 1 tablet (2 mg total) by mouth daily with breakfast. Qty: 30  tablet, Refills: 0    potassium chloride SA (K-DUR,KLOR-CON) 20 MEQ tablet Take 2 tablets (40 mEq total) by mouth 2 (two) times daily. Qty: 120 tablet, Refills: 0      STOP taking these medications     lisinopril (PRINIVIL,ZESTRIL) 5 MG tablet      clindamycin (CLEOCIN) 150 MG capsule      HYDROcodone-acetaminophen (NORCO/VICODIN) 5-325 MG per tablet         ALLERGIES:  No Known Allergies  BRIEF HPI:  See H&P, Labs, Consult and Test reports for all details in brief, patient was admitted for weight gain and orthopnea along with exertional dyspnea. Per patient for the 3-4 days prior to his admission, he also developed this persistent dry cough. Patient continues to smoke. Patient was thought to have mild exacerbation of underlying chronic systolic heart failure, and admitted for further evaluation and treatment   CONSULTATIONS:   None  PERTINENT RADIOLOGIC STUDIES: Dg Chest 2 View  02/08/2015   CLINICAL DATA:  Cough and shortness of breath for 1 day. Swelling and numbness of the feet.  EXAM: CHEST  2 VIEW  COMPARISON:  12/11/2014  FINDINGS: Bibasilar airspace opacities obscuring the hemidiaphragms, similar to prior. Mild enlargement of the cardiopericardial silhouette moderate size bilateral pleural effusions.  Thoracic spondylosis.  Emphysema.  IMPRESSION: 1. Similar appearance of cardiomegaly and moderate size bilateral pleural effusions with passive atelectasis compared to 12/11/14.   Electronically Signed   By: Gaylyn Rong M.D.   On: 02/08/2015 15:54     PERTINENT LAB RESULTS: CBC:  Recent Labs  02/08/15 1502  WBC 5.0  HGB 12.6*  HCT 38.2*  PLT 171   CMET CMP     Component Value Date/Time   NA 134* 02/10/2015 0528   K 4.6 02/10/2015 0528   CL 100 02/10/2015 0528   CO2 27 02/10/2015 0528   GLUCOSE 138* 02/10/2015 0528   BUN 18 02/10/2015 0528   CREATININE 1.28 02/10/2015 0528   CALCIUM 8.4 02/10/2015 0528   PROT 7.6 12/11/2014 1909   ALBUMIN 3.4*  12/11/2014 1909   AST 17 12/11/2014 1909   ALT 11 12/11/2014 1909   ALKPHOS 140* 12/11/2014 1909   BILITOT 1.0 12/11/2014 1909   GFRNONAA 55* 02/10/2015 0528   GFRAA 63* 02/10/2015 0528    GFR Estimated Creatinine Clearance: 50.8 mL/min (by C-G formula based on Cr of 1.28). No results for input(s): LIPASE, AMYLASE in the last 72 hours. No results for input(s): CKTOTAL, CKMB, CKMBINDEX, TROPONINI in the last 72 hours. Invalid input(s): POCBNP No results for input(s): DDIMER in the last 72 hours. No results for input(s): HGBA1C in the last 72 hours. No results for input(s): CHOL, HDL, LDLCALC, TRIG, CHOLHDL, LDLDIRECT in the last 72 hours. No results for input(s): TSH, T4TOTAL, T3FREE, THYROIDAB in the last 72 hours.  Invalid input(s): FREET3 No results for input(s):  VITAMINB12, FOLATE, FERRITIN, TIBC, IRON, RETICCTPCT in the last 72 hours. Coags: No results for input(s): INR in the last 72 hours.  Invalid input(s): PT Microbiology: No results found for this or any previous visit (from the past 240 hour(s)).   BRIEF HOSPITAL COURSE:   Acute systolic CHF (congestive heart failure): Presented with 5 pound weight gain in the past one week, with mild exertional dyspnea and new two-pillow orthopnea-but with no overwhelming edema/volume on exam.Started on IV Lasix, with significant improvement in orthopnea. This morning, this M.D. saw patient lying flat in bed. Was switched to oral Lasix, continue Coreg and losartan he did patient will be discharged home in a stable manner. I've asked him to follow-up with his PCP for further ongoing care. He has been counseled extensively regarding compliance to medication-apparently ran out of Lasix 1 week prior to this presentation. He has been asked to monitor his weight on a daily basis and let his primary care practitioner know if there is rapid weight gain    Cough: Patient has developed persistent coughing spells 3-4 days prior to this admission. He  continues to smoke. He has had no fever or hemoptysis. Chest x-ray was negative for any infiltrates. Do not think this cough is related to his cardiac issues. He was in fact on lisinopril prior to this admission this was discontinued and changed to losartan. He was managed with bronchodilators, antitussives with some improvement. Subsequently he was empirically started on Zithromax. This morning he claims some improvement, however continues to have paroxysmal of cough. We will empirically place him on tapering prednisone. We will continue treatment with PPI and Flonase. Suspect this is going to take a while for it to get better, I have asked him to follow up with his primary care practitioner for further continued care.    Hypertension: Continue Coreg and losartan.   Type 2 diabetes: Resume Amaryl on discharge. CBGs managed with SSI during this hospital stay   Tobacco abuse: Counseled extensively, patient does not seem very motivated on quitting, I have divided in nicotine transdermal patches.    Chronic bilateral foot/ankle ulcers: Continue one cares previous  TODAY-DAY OF DISCHARGE:  Subjective:   Russell Hamilton todayfeels much better, his orthopnea is significantly improved, continues to have some coughing spells however. Objective:   Blood pressure 109/68, pulse 89, temperature 97.5 F (36.4 C), temperature source Oral, resp. rate 20, height 5\' 9"  (1.753 m), weight 67.767 kg (149 lb 6.4 oz), SpO2 97 %.  Intake/Output Summary (Last 24 hours) at 02/10/15 1436 Last data filed at 02/10/15 1301  Gross per 24 hour  Intake    840 ml  Output   1300 ml  Net   -460 ml   Filed Weights   02/08/15 1845 02/09/15 0634 02/10/15 0640  Weight: 69.219 kg (152 lb 9.6 oz) 67.405 kg (148 lb 9.6 oz) 67.767 kg (149 lb 6.4 oz)    Exam Awake Alert, Oriented *3, No new F.N deficits, Normal affect [place].AT,PERRAL Supple Neck,No JVD, No cervical lymphadenopathy appriciated.  Symmetrical Chest wall movement, Good  air movement bilaterally, CTAB RRR,No Gallops,Rubs or new Murmurs, No Parasternal Heave +ve B.Sounds, Abd Soft, Non tender, No organomegaly appriciated, No rebound -guarding or rigidity. No Cyanosis, Clubbing or edema, No new Rash or bruise  DISCHARGE CONDITION: Stable  DISPOSITION: Home-with home health RN   DISCHARGE INSTRUCTIONS:    Activity:  As tolerated   Diet recommendation: Diabetic Diet Heart Healthy diet  Discharge Instructions    (HEART FAILURE  PATIENTS) Call MD:  Anytime you have any of the following symptoms: 1) 3 pound weight gain in 24 hours or 5 pounds in 1 week 2) shortness of breath, with or without a dry hacking cough 3) swelling in the hands, feet or stomach 4) if you have to sleep on extra pillows at night in order to breathe.    Complete by:  As directed      ACE Inhibitor / ARB already ordered    Complete by:  As directed      Beta Blocker already ordered    Complete by:  As directed      Diet - low sodium heart healthy    Complete by:  As directed      Diet - low sodium heart healthy    Complete by:  As directed      Heart Failure patients record your daily weight using the same scale at the same time of day    Complete by:  As directed      Increase activity slowly    Complete by:  As directed      Increase activity slowly    Complete by:  As directed      STOP any activity that causes chest pain, shortness of breath, dizziness, sweating, or exessive weakness    Complete by:  As directed           Follow-up Information    Follow up with [facility]    .   Why:  Appt.on April 11 @ 4;00pm with Dr. Carleene OverlieAmao   Contact information:   [address] [place] [id number] [phone]       Total Time spent on discharge equals 45 minutes.  SignedJeoffrey Massed: GHIMIRE,SHANKER 02/10/2015 2:36 PM

## 2015-02-10 NOTE — Progress Notes (Signed)
Witnessed Student Nurse Amanda give pt medications. Pt name and DOB verified. Medications given properly.  

## 2015-02-10 NOTE — Progress Notes (Signed)
Pt has orders to be discharged. Discharge instructions given and pt has no additional questions at this time. Pt understands he has all prescriptions needed but states "I will only fill the Lasix and Tramadol prescriptions". Pt verbalized  understanding that for his health it is important to take all prescriptions.   IV removed and site in good condition. Pt waiting for taxi voucher to leave hospital.  Jilda PandaBethany Heavenlee Maiorana RN

## 2015-02-11 LAB — RESPIRATORY VIRUS PANEL
ADENOVIRUS: NEGATIVE
INFLUENZA A: NEGATIVE
Influenza B: NEGATIVE
Metapneumovirus: NEGATIVE
PARAINFLUENZA 2 A: NEGATIVE
PARAINFLUENZA 3 A: NEGATIVE
Parainfluenza 1: NEGATIVE
RESPIRATORY SYNCYTIAL VIRUS B: NEGATIVE
RHINOVIRUS: NEGATIVE
Respiratory Syncytial Virus A: NEGATIVE

## 2015-02-14 ENCOUNTER — Inpatient Hospital Stay: Payer: Medicare (Managed Care) | Admitting: Family Medicine

## 2015-02-17 ENCOUNTER — Inpatient Hospital Stay: Payer: Medicare (Managed Care) | Admitting: Family Medicine

## 2015-02-24 ENCOUNTER — Encounter (HOSPITAL_COMMUNITY): Payer: Self-pay | Admitting: Emergency Medicine

## 2015-02-24 ENCOUNTER — Emergency Department (INDEPENDENT_AMBULATORY_CARE_PROVIDER_SITE_OTHER)
Admission: EM | Admit: 2015-02-24 | Discharge: 2015-02-24 | Disposition: A | Discharge status: Home / Self Care | Payer: Medicare (Managed Care) | Source: Home / Self Care | Attending: Family Medicine | Admitting: Family Medicine

## 2015-02-24 ENCOUNTER — Inpatient Hospital Stay (HOSPITAL_COMMUNITY)
Admission: EM | Admit: 2015-02-24 | Discharge: 2015-03-01 | DRG: 291 | Disposition: A | Discharge status: Home / Self Care | Payer: Medicare (Managed Care) | Attending: Internal Medicine | Admitting: Internal Medicine

## 2015-02-24 ENCOUNTER — Emergency Department (HOSPITAL_COMMUNITY): Payer: Medicare (Managed Care)

## 2015-02-24 ENCOUNTER — Emergency Department (INDEPENDENT_AMBULATORY_CARE_PROVIDER_SITE_OTHER): Payer: Medicare (Managed Care)

## 2015-02-24 DIAGNOSIS — I5043 Acute on chronic combined systolic (congestive) and diastolic (congestive) heart failure: Secondary | ICD-10-CM | POA: Diagnosis not present

## 2015-02-24 DIAGNOSIS — N183 Chronic kidney disease, stage 3 unspecified: Secondary | ICD-10-CM

## 2015-02-24 DIAGNOSIS — R079 Chest pain, unspecified: Secondary | ICD-10-CM | POA: Diagnosis not present

## 2015-02-24 DIAGNOSIS — J449 Chronic obstructive pulmonary disease, unspecified: Secondary | ICD-10-CM | POA: Diagnosis present

## 2015-02-24 DIAGNOSIS — Z8673 Personal history of transient ischemic attack (TIA), and cerebral infarction without residual deficits: Secondary | ICD-10-CM

## 2015-02-24 DIAGNOSIS — L84 Corns and callosities: Secondary | ICD-10-CM | POA: Diagnosis present

## 2015-02-24 DIAGNOSIS — E1159 Type 2 diabetes mellitus with other circulatory complications: Secondary | ICD-10-CM

## 2015-02-24 DIAGNOSIS — I5023 Acute on chronic systolic (congestive) heart failure: Secondary | ICD-10-CM | POA: Diagnosis not present

## 2015-02-24 DIAGNOSIS — L97509 Non-pressure chronic ulcer of other part of unspecified foot with unspecified severity: Secondary | ICD-10-CM | POA: Diagnosis present

## 2015-02-24 DIAGNOSIS — I129 Hypertensive chronic kidney disease with stage 1 through stage 4 chronic kidney disease, or unspecified chronic kidney disease: Secondary | ICD-10-CM | POA: Diagnosis present

## 2015-02-24 DIAGNOSIS — Z66 Do not resuscitate: Secondary | ICD-10-CM | POA: Diagnosis present

## 2015-02-24 DIAGNOSIS — F17210 Nicotine dependence, cigarettes, uncomplicated: Secondary | ICD-10-CM | POA: Diagnosis present

## 2015-02-24 DIAGNOSIS — I509 Heart failure, unspecified: Secondary | ICD-10-CM

## 2015-02-24 DIAGNOSIS — Z6821 Body mass index (BMI) 21.0-21.9, adult: Secondary | ICD-10-CM

## 2015-02-24 DIAGNOSIS — I25110 Atherosclerotic heart disease of native coronary artery with unstable angina pectoris: Secondary | ICD-10-CM | POA: Diagnosis present

## 2015-02-24 DIAGNOSIS — I10 Essential (primary) hypertension: Secondary | ICD-10-CM | POA: Diagnosis not present

## 2015-02-24 DIAGNOSIS — Z7952 Long term (current) use of systemic steroids: Secondary | ICD-10-CM

## 2015-02-24 DIAGNOSIS — L97529 Non-pressure chronic ulcer of other part of left foot with unspecified severity: Secondary | ICD-10-CM | POA: Diagnosis present

## 2015-02-24 DIAGNOSIS — R0600 Dyspnea, unspecified: Secondary | ICD-10-CM

## 2015-02-24 DIAGNOSIS — E1129 Type 2 diabetes mellitus with other diabetic kidney complication: Secondary | ICD-10-CM | POA: Insufficient documentation

## 2015-02-24 DIAGNOSIS — D509 Iron deficiency anemia, unspecified: Secondary | ICD-10-CM | POA: Diagnosis present

## 2015-02-24 DIAGNOSIS — T380X5A Adverse effect of glucocorticoids and synthetic analogues, initial encounter: Secondary | ICD-10-CM | POA: Diagnosis present

## 2015-02-24 DIAGNOSIS — R0602 Shortness of breath: Secondary | ICD-10-CM | POA: Diagnosis not present

## 2015-02-24 DIAGNOSIS — Z9119 Patient's noncompliance with other medical treatment and regimen: Secondary | ICD-10-CM | POA: Diagnosis present

## 2015-02-24 DIAGNOSIS — N179 Acute kidney failure, unspecified: Secondary | ICD-10-CM | POA: Diagnosis present

## 2015-02-24 DIAGNOSIS — G629 Polyneuropathy, unspecified: Secondary | ICD-10-CM | POA: Diagnosis present

## 2015-02-24 DIAGNOSIS — E43 Unspecified severe protein-calorie malnutrition: Secondary | ICD-10-CM | POA: Diagnosis present

## 2015-02-24 DIAGNOSIS — R197 Diarrhea, unspecified: Secondary | ICD-10-CM | POA: Diagnosis present

## 2015-02-24 DIAGNOSIS — Z720 Tobacco use: Secondary | ICD-10-CM | POA: Diagnosis present

## 2015-02-24 DIAGNOSIS — Z7982 Long term (current) use of aspirin: Secondary | ICD-10-CM

## 2015-02-24 DIAGNOSIS — K219 Gastro-esophageal reflux disease without esophagitis: Secondary | ICD-10-CM | POA: Diagnosis present

## 2015-02-24 DIAGNOSIS — E871 Hypo-osmolality and hyponatremia: Secondary | ICD-10-CM | POA: Diagnosis present

## 2015-02-24 DIAGNOSIS — E1122 Type 2 diabetes mellitus with diabetic chronic kidney disease: Secondary | ICD-10-CM | POA: Insufficient documentation

## 2015-02-24 DIAGNOSIS — E1165 Type 2 diabetes mellitus with hyperglycemia: Secondary | ICD-10-CM | POA: Diagnosis present

## 2015-02-24 DIAGNOSIS — J9601 Acute respiratory failure with hypoxia: Secondary | ICD-10-CM | POA: Diagnosis present

## 2015-02-24 DIAGNOSIS — I200 Unstable angina: Secondary | ICD-10-CM

## 2015-02-24 DIAGNOSIS — I255 Ischemic cardiomyopathy: Secondary | ICD-10-CM | POA: Diagnosis present

## 2015-02-24 DIAGNOSIS — IMO0002 Reserved for concepts with insufficient information to code with codable children: Secondary | ICD-10-CM | POA: Insufficient documentation

## 2015-02-24 HISTORY — DX: Patient's noncompliance with other medical treatment and regimen due to unspecified reason: Z91.199

## 2015-02-24 HISTORY — DX: Patient's noncompliance with other medical treatment and regimen: Z91.19

## 2015-02-24 HISTORY — DX: Non-pressure chronic ulcer of unspecified part of left lower leg with unspecified severity: L97.929

## 2015-02-24 HISTORY — DX: Non-pressure chronic ulcer of unspecified part of right lower leg with unspecified severity: L97.919

## 2015-02-24 HISTORY — DX: Non-pressure chronic ulcer of unspecified part of right lower leg with unspecified severity: L97.929

## 2015-02-24 LAB — CBC WITH DIFFERENTIAL/PLATELET
Basophils Absolute: 0 10*3/uL (ref 0.0–0.1)
Basophils Relative: 0 % (ref 0–1)
EOS ABS: 0 10*3/uL (ref 0.0–0.7)
Eosinophils Relative: 1 % (ref 0–5)
HCT: 39.6 % (ref 39.0–52.0)
Hemoglobin: 12.9 g/dL — ABNORMAL LOW (ref 13.0–17.0)
LYMPHS ABS: 1.1 10*3/uL (ref 0.7–4.0)
LYMPHS PCT: 25 % (ref 12–46)
MCH: 29.1 pg (ref 26.0–34.0)
MCHC: 32.6 g/dL (ref 30.0–36.0)
MCV: 89.2 fL (ref 78.0–100.0)
Monocytes Absolute: 0.4 10*3/uL (ref 0.1–1.0)
Monocytes Relative: 10 % (ref 3–12)
NEUTROS PCT: 64 % (ref 43–77)
Neutro Abs: 2.9 10*3/uL (ref 1.7–7.7)
PLATELETS: 175 10*3/uL (ref 150–400)
RBC: 4.44 MIL/uL (ref 4.22–5.81)
RDW: 15.3 % (ref 11.5–15.5)
WBC: 4.5 10*3/uL (ref 4.0–10.5)

## 2015-02-24 LAB — POCT I-STAT, CHEM 8
BUN: 12 mg/dL (ref 6–23)
Calcium, Ion: 1.13 mmol/L (ref 1.13–1.30)
Chloride: 99 mmol/L (ref 96–112)
Creatinine, Ser: 1 mg/dL (ref 0.50–1.35)
Glucose, Bld: 184 mg/dL — ABNORMAL HIGH (ref 70–99)
HEMATOCRIT: 44 % (ref 39.0–52.0)
Hemoglobin: 15 g/dL (ref 13.0–17.0)
Potassium: 3.9 mmol/L (ref 3.5–5.1)
SODIUM: 138 mmol/L (ref 135–145)
TCO2: 23 mmol/L (ref 0–100)

## 2015-02-24 LAB — BASIC METABOLIC PANEL
Anion gap: 7 (ref 5–15)
BUN: 11 mg/dL (ref 6–23)
CHLORIDE: 100 mmol/L (ref 96–112)
CO2: 28 mmol/L (ref 19–32)
Calcium: 8.7 mg/dL (ref 8.4–10.5)
Creatinine, Ser: 0.99 mg/dL (ref 0.50–1.35)
GFR calc Af Amer: >90 mL/min (ref >90)
GFR, EST NON AFRICAN AMERICAN: 80 mL/min — AB (ref >90)
Glucose, Bld: 160 mg/dL — ABNORMAL HIGH (ref 70–99)
Potassium: 3.6 mmol/L (ref 3.5–5.1)
SODIUM: 135 mmol/L (ref 135–145)

## 2015-02-24 LAB — GLUCOSE, CAPILLARY: Glucose-Capillary: 165 mg/dL — ABNORMAL HIGH (ref 70–99)

## 2015-02-24 LAB — BRAIN NATRIURETIC PEPTIDE: B NATRIURETIC PEPTIDE 5: 1739.5 pg/mL — AB (ref 0.0–100.0)

## 2015-02-24 MED ORDER — ENOXAPARIN SODIUM 40 MG/0.4ML ~~LOC~~ SOLN
40.0000 mg | SUBCUTANEOUS | Status: DC
  Filled 2015-02-24: qty 0.4

## 2015-02-24 MED ORDER — ONDANSETRON HCL 4 MG/2ML IJ SOLN: 4.0000 mg | Freq: Four times a day (QID) | INTRAMUSCULAR | Status: DC | PRN

## 2015-02-24 MED ORDER — SODIUM CHLORIDE 0.9 % IJ SOLN
3.0000 mL | Freq: Two times a day (BID) | INTRAMUSCULAR | Status: DC
  Administered 2015-02-24 – 2015-03-01 (×9): 3 mL via INTRAVENOUS

## 2015-02-24 MED ORDER — SODIUM CHLORIDE 0.9 % IJ SOLN
INTRAMUSCULAR | Status: AC
  Filled 2015-02-24: qty 6

## 2015-02-24 MED ORDER — INSULIN ASPART 100 UNIT/ML ~~LOC~~ SOLN: 0.0000 [IU] | Freq: Every day | SUBCUTANEOUS | Status: DC

## 2015-02-24 MED ORDER — ONDANSETRON HCL 4 MG PO TABS
4.0000 mg | ORAL_TABLET | Freq: Four times a day (QID) | ORAL | Status: DC | PRN
Start: 2015-02-24 — End: 2015-03-01

## 2015-02-24 MED ORDER — MORPHINE SULFATE 2 MG/ML IJ SOLN
1.0000 mg | INTRAMUSCULAR | Status: DC | PRN
  Administered 2015-02-24 – 2015-02-26 (×5): 1 mg via INTRAVENOUS
  Filled 2015-02-24 (×5): qty 1

## 2015-02-24 MED ORDER — NITROGLYCERIN 0.4 MG SL SUBL
SUBLINGUAL_TABLET | SUBLINGUAL | Status: AC
  Filled 2015-02-24: qty 1

## 2015-02-24 MED ORDER — ALBUTEROL SULFATE (2.5 MG/3ML) 0.083% IN NEBU
2.5000 mg | INHALATION_SOLUTION | Freq: Four times a day (QID) | RESPIRATORY_TRACT | Status: DC
  Administered 2015-02-24 – 2015-02-26 (×6): 2.5 mg via RESPIRATORY_TRACT
  Filled 2015-02-24 (×6): qty 3

## 2015-02-24 MED ORDER — LOSARTAN POTASSIUM 25 MG PO TABS
25.0000 mg | ORAL_TABLET | Freq: Once | ORAL | Status: AC
  Administered 2015-02-24: 25 mg via ORAL
  Filled 2015-02-24: qty 1

## 2015-02-24 MED ORDER — ASPIRIN 81 MG PO CHEW
81.0000 mg | CHEWABLE_TABLET | Freq: Every day | ORAL | Status: DC
Start: 2015-02-25 — End: 2015-03-01
  Administered 2015-02-25 – 2015-02-28 (×4): 81 mg via ORAL
  Filled 2015-02-24 (×4): qty 1

## 2015-02-24 MED ORDER — FUROSEMIDE 40 MG PO TABS
40.0000 mg | ORAL_TABLET | Freq: Every day | ORAL | Status: DC
Start: 2015-02-24 — End: 2015-02-24
  Administered 2015-02-24: 40 mg via ORAL
  Filled 2015-02-24: qty 2

## 2015-02-24 MED ORDER — METHYLPREDNISOLONE SODIUM SUCC 125 MG IJ SOLR
60.0000 mg | Freq: Three times a day (TID) | INTRAMUSCULAR | Status: DC
  Administered 2015-02-24 – 2015-02-25 (×2): 60 mg via INTRAVENOUS
  Filled 2015-02-24 (×2): qty 0.96
  Filled 2015-02-24: qty 2
  Filled 2015-02-24 (×2): qty 0.96

## 2015-02-24 MED ORDER — ACETAMINOPHEN 325 MG PO TABS: 650.0000 mg | ORAL_TABLET | Freq: Four times a day (QID) | ORAL | Status: DC | PRN

## 2015-02-24 MED ORDER — SODIUM CHLORIDE 0.9 % IJ SOLN: 3.0000 mL | INTRAMUSCULAR | Status: DC | PRN

## 2015-02-24 MED ORDER — ACETAMINOPHEN 650 MG RE SUPP: 650.0000 mg | Freq: Four times a day (QID) | RECTAL | Status: DC | PRN

## 2015-02-24 MED ORDER — ALBUTEROL SULFATE (2.5 MG/3ML) 0.083% IN NEBU: 2.5000 mg | INHALATION_SOLUTION | RESPIRATORY_TRACT | Status: DC | PRN

## 2015-02-24 MED ORDER — NICOTINE 21 MG/24HR TD PT24
21.0000 mg | MEDICATED_PATCH | Freq: Every day | TRANSDERMAL | Status: DC
  Filled 2015-02-24 (×5): qty 1

## 2015-02-24 MED ORDER — CARVEDILOL 6.25 MG PO TABS
6.2500 mg | ORAL_TABLET | Freq: Two times a day (BID) | ORAL | Status: DC
Start: 2015-02-25 — End: 2015-02-24

## 2015-02-24 MED ORDER — TRAMADOL HCL 50 MG PO TABS
50.0000 mg | ORAL_TABLET | Freq: Four times a day (QID) | ORAL | Status: DC | PRN
  Administered 2015-02-27 – 2015-02-28 (×2): 50 mg via ORAL
  Filled 2015-02-24 (×2): qty 1

## 2015-02-24 MED ORDER — SODIUM CHLORIDE 0.9 % IV SOLN
INTRAVENOUS | Status: DC
  Administered 2015-02-24: 17:00:00 via INTRAVENOUS

## 2015-02-24 MED ORDER — LOSARTAN POTASSIUM 25 MG PO TABS
25.0000 mg | ORAL_TABLET | Freq: Every day | ORAL | Status: DC
  Administered 2015-02-25 – 2015-02-27 (×3): 25 mg via ORAL
  Filled 2015-02-24 (×3): qty 1

## 2015-02-24 MED ORDER — ZOLPIDEM TARTRATE 5 MG PO TABS: 5.0000 mg | ORAL_TABLET | Freq: Every evening | ORAL | Status: DC | PRN

## 2015-02-24 MED ORDER — PANTOPRAZOLE SODIUM 40 MG PO TBEC
40.0000 mg | DELAYED_RELEASE_TABLET | Freq: Every day | ORAL | Status: DC
  Administered 2015-02-25 – 2015-02-28 (×4): 40 mg via ORAL
  Filled 2015-02-24 (×2): qty 1

## 2015-02-24 MED ORDER — POTASSIUM CHLORIDE CRYS ER 20 MEQ PO TBCR
40.0000 meq | EXTENDED_RELEASE_TABLET | Freq: Two times a day (BID) | ORAL | Status: DC
  Administered 2015-02-24: 40 meq via ORAL
  Filled 2015-02-24 (×7): qty 2

## 2015-02-24 MED ORDER — FLUTICASONE PROPIONATE 50 MCG/ACT NA SUSP
2.0000 | Freq: Every day | NASAL | Status: DC
  Administered 2015-02-25 – 2015-03-01 (×5): 2 via NASAL
  Filled 2015-02-24: qty 16

## 2015-02-24 MED ORDER — SODIUM CHLORIDE 0.9 % IV SOLN
250.0000 mL | INTRAVENOUS | Status: DC | PRN
  Administered 2015-02-25: 250 mL via INTRAVENOUS

## 2015-02-24 MED ORDER — ASPIRIN 81 MG PO CHEW
324.0000 mg | CHEWABLE_TABLET | Freq: Once | ORAL | Status: AC
  Administered 2015-02-24: 324 mg via ORAL

## 2015-02-24 MED ORDER — SODIUM CHLORIDE 0.9 % IJ SOLN
3.0000 mL | Freq: Two times a day (BID) | INTRAMUSCULAR | Status: DC
  Administered 2015-02-24 – 2015-02-25 (×3): 3 mL via INTRAVENOUS

## 2015-02-24 MED ORDER — FUROSEMIDE 10 MG/ML IJ SOLN
40.0000 mg | Freq: Two times a day (BID) | INTRAMUSCULAR | Status: DC
  Administered 2015-02-25 – 2015-02-26 (×3): 40 mg via INTRAVENOUS
  Filled 2015-02-24 (×4): qty 4

## 2015-02-24 MED ORDER — NITROGLYCERIN 0.4 MG SL SUBL
0.4000 mg | SUBLINGUAL_TABLET | SUBLINGUAL | Status: DC | PRN
  Administered 2015-02-24: 0.4 mg via SUBLINGUAL

## 2015-02-24 MED ORDER — GABAPENTIN 300 MG PO CAPS
300.0000 mg | ORAL_CAPSULE | Freq: Three times a day (TID) | ORAL | Status: DC
  Filled 2015-02-24 (×10): qty 1

## 2015-02-24 MED ORDER — CARVEDILOL 6.25 MG PO TABS
6.2500 mg | ORAL_TABLET | Freq: Two times a day (BID) | ORAL | Status: DC
  Administered 2015-02-25 – 2015-02-26 (×4): 6.25 mg via ORAL
  Filled 2015-02-24 (×5): qty 1

## 2015-02-24 MED ORDER — GUAIFENESIN ER 600 MG PO TB12
600.0000 mg | ORAL_TABLET | Freq: Two times a day (BID) | ORAL | Status: DC
  Administered 2015-02-24 – 2015-03-01 (×10): 600 mg via ORAL
  Filled 2015-02-24 (×11): qty 1

## 2015-02-24 MED ORDER — ASPIRIN 81 MG PO CHEW
CHEWABLE_TABLET | ORAL | Status: AC
  Filled 2015-02-24: qty 4

## 2015-02-24 MED ORDER — LORATADINE 10 MG PO TABS
10.0000 mg | ORAL_TABLET | Freq: Every day | ORAL | Status: DC
  Administered 2015-02-25 – 2015-03-01 (×4): 10 mg via ORAL
  Filled 2015-02-24 (×5): qty 1

## 2015-02-24 MED ORDER — INSULIN ASPART 100 UNIT/ML ~~LOC~~ SOLN
0.0000 [IU] | Freq: Three times a day (TID) | SUBCUTANEOUS | Status: DC
  Administered 2015-02-25: 7 [IU] via SUBCUTANEOUS
  Administered 2015-02-25: 9 [IU] via SUBCUTANEOUS

## 2015-02-24 MED ORDER — GLIMEPIRIDE 2 MG PO TABS
2.0000 mg | ORAL_TABLET | Freq: Every day | ORAL | Status: DC
  Administered 2015-02-25: 2 mg via ORAL
  Filled 2015-02-24 (×2): qty 1

## 2015-02-24 NOTE — ED Notes (Signed)
[facility], bilateral foot swelling, and SOB that gets worse daily since being D/C from hospital. [facility], had left chest pain that radiated down left arm. Given nitro x1 and ASA 324mg . Denies chest pain on arrival to ED. Alert and Oriented x4.

## 2015-02-24 NOTE — ED Notes (Signed)
Case management at bedside.

## 2015-02-24 NOTE — ED Provider Notes (Signed)
CSN: [id number]     Arrival date & time 02/24/15  1531 History   First MD Initiated Contact with Patient 02/24/15 1548     Chief Complaint  Patient presents with  . Foot Swelling   (Consider location/radiation/quality/duration/timing/severity/associated sxs/prior Treatment) HPI      74 year old male with history of peripheral neuropathy as well as chronic systolic heart failure with recent admission for heart failure exacerbation earlier this month presents complaining of ongoing leg pain and swelling. This is worse in his left leg. This is been going on for 3 months. He has areas of dead skin on the left foot are painful. He does not think they're healing appropriately. This did not get any better when he was admitted and given IV Lasix. He says his breathing has not gotten any better either. He is supposed to follow-up with primary care provider a few days after he was discharged from the hospital but he says he forgot. He was prescribed approximately 14 medications when he left the hospital, he was only able to afford four of the prescriptions including his Lasix and his Tussionex cough syrup.  Past Medical History  Diagnosis Date  . Hypertension   . Chronic systolic CHF (congestive heart failure)     a. 2D ECHO 08/13/14: EF 40%, diffuse hypokinesis possibly worse in the inferior wall. Mild MR, mild LA dilation, PA pressure 50. Trivial pericardial effusion.  . Acute systolic CHF (congestive heart failure), NYHA class 4 08/12/2014  . Type II diabetes mellitus   . GERD (gastroesophageal reflux disease)   . Stroke 2010    "memory problems since; right leg and left arm weakness since" (02/08/2015)  . Peripheral neuropathy     /notes 08/12/2014   Past Surgical History  Procedure Laterality Date  . Carotid endarterectomy Right 2010  . Hand ligament reconstruction Right ?1970's    "pinky"   Family History  Problem Relation Age of Onset  . Kidney disease Mother    History  Substance Use  Topics  . Smoking status: Current Every Day Smoker -- 0.75 packs/day for 56 years    Types: Cigarettes  . Smokeless tobacco: Never Used  . Alcohol Use: Yes     Comment: 02/08/2015 "I might have a beer q 6 months"    Review of Systems  Constitutional: Negative for fever and chills.  Respiratory: Positive for shortness of breath. Negative for cough.   Cardiovascular: Positive for leg swelling. Negative for chest pain.  Gastrointestinal: Negative for nausea, vomiting and diarrhea.  Genitourinary: Negative for decreased urine volume.  Musculoskeletal:       Leg pain and swelling  Skin: Positive for wound.  All other systems reviewed and are negative.   Allergies  Review of patient's allergies indicates no known allergies.  Home Medications   Prior to Admission medications   Medication Sig Start Date End Date Taking? Authorizing Provider  albuterol (PROVENTIL HFA;VENTOLIN HFA) 108 (90 BASE) MCG/ACT inhaler Inhale 2 puffs into the lungs every 6 (six) hours as needed for wheezing or shortness of breath. 02/10/15   Shanker Levora DredgeM Ghimire, MD  aspirin 81 MG chewable tablet Chew 1 tablet (81 mg total) by mouth daily. 02/10/15   Shanker Levora DredgeM Ghimire, MD  azithromycin (ZITHROMAX) 500 MG tablet Take 1 tablet (500 mg total) by mouth daily. 02/10/15   Shanker Levora DredgeM Ghimire, MD  benzonatate (TESSALON) 200 MG capsule Take 1 capsule (200 mg total) by mouth 2 (two) times daily. 02/10/15   Shanker Levora DredgeM Ghimire, MD  carvedilol (COREG) 6.25 MG tablet Take 1 tablet (6.25 mg total) by mouth 2 (two) times daily with a meal. 02/10/15   Shanker Levora Dredge, MD  chlorpheniramine-HYDROcodone (TUSSIONEX) 10-8 MG/5ML LQCR Take 5 mLs by mouth every 12 (twelve) hours. 02/10/15   Shanker Levora Dredge, MD  collagenase (SANTYL) ointment Apply topically daily. 02/10/15   Shanker Levora Dredge, MD  fluticasone (FLONASE) 50 MCG/ACT nasal spray Place 2 sprays into both nostrils daily. 02/10/15   Shanker Levora Dredge, MD  furosemide (LASIX) 40 MG tablet Take 1  tablet (40 mg total) by mouth daily. 02/10/15   Shanker Levora Dredge, MD  gabapentin (NEURONTIN) 300 MG capsule Take 1 capsule (300 mg total) by mouth 3 (three) times daily. 02/10/15   Shanker Levora Dredge, MD  glimepiride (AMARYL) 2 MG tablet Take 1 tablet (2 mg total) by mouth daily with breakfast. 02/10/15   Maretta Bees, MD  loratadine (CLARITIN) 10 MG tablet Take 1 tablet (10 mg total) by mouth daily. 02/10/15   Shanker Levora Dredge, MD  losartan (COZAAR) 25 MG tablet Take 1 tablet (25 mg total) by mouth daily. 02/10/15   Shanker Levora Dredge, MD  nicotine (NICODERM CQ - DOSED IN MG/24 HOURS) 21 mg/24hr patch Place 1 patch (21 mg total) onto the skin daily. 02/10/15   Shanker Levora Dredge, MD  pantoprazole (PROTONIX) 40 MG tablet Take 1 tablet (40 mg total) by mouth daily. 02/10/15   Shanker Levora Dredge, MD  potassium chloride SA (K-DUR,KLOR-CON) 20 MEQ tablet Take 2 tablets (40 mEq total) by mouth 2 (two) times daily. 02/10/15   Shanker Levora Dredge, MD  predniSONE (DELTASONE) 10 MG tablet Take 4 tablets (40 mg) daily for 2 days, then, Take 3 tablets (30 mg) daily for 2 days, then, Take 2 tablets (20 mg) daily for 2 days, then, Take 1 tablets (10 mg) daily for 1 days, then stop 02/10/15   Maretta Bees, MD  traMADol (ULTRAM) 50 MG tablet Take 1 tablet (50 mg total) by mouth every 6 (six) hours as needed for moderate pain (cough). 02/10/15   Shanker Levora Dredge, MD   BP 135/87 mmHg  Pulse 108  Temp(Src) 97.4 F (36.3 C) (Oral)  Resp 16  SpO2 93% Physical Exam  Constitutional: He is oriented to person, place, and time. He appears well-developed and well-nourished. No distress.  HENT:  Head: Normocephalic.  Cardiovascular: Normal rate, regular rhythm and normal heart sounds.   Pulmonary/Chest: Effort normal. No respiratory distress. He has rales (bilateral lower lung fields).  Musculoskeletal:  Pitting edema of the bilateral lower extremities, worse in the left. Left is also erythematous and tender. The lateral portion  of the left foot he has skin breakdown, epidermis only. A large portion of the skin of the left foot is hyperpigmented and thickened  Neurological: He is alert and oriented to person, place, and time. Coordination normal.  Skin: Skin is warm and dry. No rash noted. He is not diaphoretic.  Psychiatric: He has a normal mood and affect. Judgment normal.  Nursing note and vitals reviewed.  Oxygen saturation stayed above 95% with ambulation  ED Course  ED EKG  Date/Time: 02/24/2015 5:09 PM Performed by: Autumn Messing H Authorized by: Autumn Messing H Comparison: compared with previous ECG  Comparison to previous ECG: Minimal change  Rhythm: sinus rhythm Ectopy: PVCs Rate: normal QRS axis: left Conduction: conduction normal ST Depression: V5 and V6 T depression: V6, V5, V4, I and aVL Other findings: prolonged QTc interval Clinical  impression: abnormal ECG and myocardial ischemia Comments: Concerning for lateral ischemia. EKG independently reviewed by me as above   (including critical care time) Labs Review Labs Reviewed  POCT I-STAT, CHEM 8 - Abnormal; Notable for the following:    Glucose, Bld 184 (*)    All other components within normal limits    Imaging Review Dg Chest 2 View  02/24/2015   CLINICAL DATA:  Shortness of breath, coughing.  EXAM: CHEST  2 VIEW  COMPARISON:  February 08, 2015.  FINDINGS: Stable cardiomediastinal silhouette. No pneumothorax is noted. Stable moderate size bilateral pleural effusions are noted compared to prior exam mild interstitial densities are noted throughout both lungs suggesting pulmonary edema. Degenerative change of both acromioclavicular joints is noted.  IMPRESSION: Stable cardiomegaly with associated bilateral pulmonary edema and pleural effusions concerning for congestive heart failure.   Electronically Signed   By: Lupita Raider, M.D.   On: 02/24/2015 16:46   Dg Foot Complete Left  02/24/2015   CLINICAL DATA:  Patient hospitalized 2 weeks  ago for foot infection. History of diabetes. Now with sores involving the left foot. No known injury.  EXAM: LEFT FOOT - COMPLETE 3+ VIEW  COMPARISON:  11/28/2014  FINDINGS: There is apparent soft tissue defect involving the lateral mid aspect of the foot. This finding is without associated radiopaque foreign body, subcutaneous emphysema or discrete area of osteolysis to suggest osteomyelitis.  No fracture or dislocation. Joint spaces are preserved. No erosions. Minimal enthesopathic change of the Achilles tendon insertion site. Distal vascular calcifications compatible with provided history of diabetes.  IMPRESSION: Soft tissue defect involving the lateral mid aspect of the foot without radiopaque foreign body or radiographic evidence of osteomyelitis.   Electronically Signed   By: Simonne Come M.D.   On: 02/24/2015 16:45     MDM   1. Heart failure, unspecified heart failure chronicity, unspecified heart failure type   2. Chest pain, unspecified chest pain type    While we were attempting the weigh this patient, he began to develop a sensation of pins and needles in his left chest that radiates down his left arm along with chest tightness and shortness of breath. He'll be transferred to the emergency department for further evaluation  Meds ordered this encounter  Medications  . 0.9 %  sodium chloride infusion    Sig:   . aspirin chewable tablet 324 mg    Sig:   . nitroGLYCERIN (NITROSTAT) SL tablet 0.4 mg    Sig:       Graylon Good, PA-C 02/24/15 1714

## 2015-02-24 NOTE — Progress Notes (Addendum)
ED CM received consult from [place] regarding medication assistance, possible HH services for HF and  wound care. Patient presented to [facility] ED today with SOB, bilateral leg swelling, with BLE chronic wounds,  Hx of CHF,.  Patient was apparently discharged earlier this month s/p CHF exacerbation and states, he has run out of meds cannot afford copay, and has not had follow up. Reviewed patient's record with CM note, also noted to have a couple of  missed appointments at the [facility].  Met with patient at bedside, confirmed demographic information. Patient reports that he lives alone, and does not have a solid support system, he said he has niece, but she has 5 children at home and cannot assist with his care.  Discussed missed [place] follow up appointments, patient states "Since his stroke his memory is bad".  Discussed TCC management  high risk initiative at [facility],  patient may benefits from the services, patient is agreeable . Also discussed the [facility] care management services patient is agreeable to services, referral placed 4/21.  EDP recommending  [place] for BLE wound care, noted wound consult done 02/09/15.  Spoke with Dr. Jeneen Rinks consult Hospitalist, patient's oxygen saturations dropped, orthostatic sats  86% 2L [place]. Patient placed in observation status. Unit CM will follow patient for discharge plan.

## 2015-02-24 NOTE — ED Notes (Signed)
Pt states that he was in the hospital early part of April. States that his feet have been swollen since then

## 2015-02-24 NOTE — ED Provider Notes (Signed)
CSN: [id number]     Arrival date & time 02/24/15  1819 History   First MD Initiated Contact with Patient 02/24/15 1822     Chief Complaint  Patient presents with  . Chest Pain  . Shortness of Breath  . Leg Pain      HPI  Patient presents for evaluation of a sore on his leg, shortness of breath. Patient recently admitted with leg swelling, congestive heart failure, diabetic foot ulcer. He states that he still has "peeling skin" on his feet.  States that he did fill his Lasix and has been taking it. He admits that he only filled "3 or 4" of his medications because "that's all they would pay for". I did note with a care manager note prior to his discharge and arrangements made for him to have assistance with his medications. However, the patient did not make his most recent appointment at [facility].  I also note that in the chart he stated prior to discharge "I'm only going to fill the Lasix and the Ultram". Pt states that "since my stroke, I forget things".  Nonetheless, he presented urgent care Center today concerned that his foot ulcer had not completely healed. Had a foot x-ray there that did not show any signs of osteolysis. Had one episode where he complained of feeling short of breath, and he was transferred to [facility] for further evaluation. He denies chest pain or shortness of breath here.  Past Medical History  Diagnosis Date  . Hypertension   . Chronic systolic CHF (congestive heart failure)     a. 2D ECHO 08/13/14: EF 40%, diffuse hypokinesis possibly worse in the inferior wall. Mild MR, mild LA dilation, PA pressure 50. Trivial pericardial effusion.  . Acute systolic CHF (congestive heart failure), NYHA class 4 08/12/2014  . Type II diabetes mellitus   . GERD (gastroesophageal reflux disease)   . Peripheral neuropathy     [name] 08/12/2014  . Stroke 2010    "memory problems since; right leg and left arm weakness since" (02/08/2015)  . Ulcers of both lower extremities       [name] 02/24/2015  . Noncompliance     [name] 02/24/2015   Past Surgical History  Procedure Laterality Date  . Carotid endarterectomy Right 2010  . Hand ligament reconstruction Right ?1970's    "pinky"   Family History  Problem Relation Age of Onset  . Kidney disease Mother    History  Substance Use Topics  . Smoking status: Current Every Day Smoker -- 0.75 packs/day for 56 years    Types: Cigarettes  . Smokeless tobacco: Never Used  . Alcohol Use: Yes     Comment: 02/08/2015 "I might have a beer q 6 months"    Review of Systems  Constitutional: Negative for fever, chills, diaphoresis, appetite change and fatigue.  HENT: Negative for mouth sores, sore throat and trouble swallowing.   Eyes: Negative for visual disturbance.  Respiratory: Positive for shortness of breath. Negative for cough, chest tightness and wheezing.   Cardiovascular: Negative for chest pain.  Gastrointestinal: Negative for nausea, vomiting, abdominal pain, diarrhea and abdominal distention.  Endocrine: Negative for polydipsia, polyphagia and polyuria.  Genitourinary: Negative for dysuria, frequency and hematuria.  Musculoskeletal: Negative for gait problem.  Skin: Negative for color change, pallor and rash.       Chronic ulceration lateral left foot. Peeling skin on "both my legs  Neurological: Negative for dizziness, syncope, light-headedness and headaches.  Hematological: Does not bruise/bleed easily.  Psychiatric/Behavioral: Negative for behavioral problems and confusion.      Allergies  Review of patient's allergies indicates no known allergies.  Home Medications   Prior to Admission medications   Medication Sig Start Date End Date Taking? Authorizing Provider  aspirin 81 MG chewable tablet Chew 1 tablet (81 mg total) by mouth daily. 02/10/15  Yes Shanker Levora Dredge, MD  benzonatate (TESSALON) 200 MG capsule Take 1 capsule (200 mg total) by mouth 2 (two) times daily. 02/10/15  Yes Shanker Levora Dredge,  MD  carvedilol (COREG) 6.25 MG tablet Take 1 tablet (6.25 mg total) by mouth 2 (two) times daily with a meal. 02/10/15  Yes Shanker Levora Dredge, MD  furosemide (LASIX) 40 MG tablet Take 1 tablet (40 mg total) by mouth daily. 02/10/15  Yes Shanker Levora Dredge, MD  gabapentin (NEURONTIN) 300 MG capsule Take 1 capsule (300 mg total) by mouth 3 (three) times daily. 02/10/15  Yes Shanker Levora Dredge, MD  glimepiride (AMARYL) 2 MG tablet Take 1 tablet (2 mg total) by mouth daily with breakfast. 02/10/15  Yes Shanker Levora Dredge, MD  losartan (COZAAR) 25 MG tablet Take 1 tablet (25 mg total) by mouth daily. 02/10/15  Yes Shanker Levora Dredge, MD  potassium chloride SA (K-DUR,KLOR-CON) 20 MEQ tablet Take 2 tablets (40 mEq total) by mouth 2 (two) times daily. 02/10/15  Yes Shanker Levora Dredge, MD  albuterol (PROVENTIL HFA;VENTOLIN HFA) 108 (90 BASE) MCG/ACT inhaler Inhale 2 puffs into the lungs every 6 (six) hours as needed for wheezing or shortness of breath. 02/10/15   Shanker Levora Dredge, MD  azithromycin (ZITHROMAX) 500 MG tablet Take 1 tablet (500 mg total) by mouth daily. 02/10/15   Shanker Levora Dredge, MD  chlorpheniramine-HYDROcodone (TUSSIONEX) 10-8 MG/5ML LQCR Take 5 mLs by mouth every 12 (twelve) hours. 02/10/15   Shanker Levora Dredge, MD  collagenase (SANTYL) ointment Apply topically daily. 02/10/15   Shanker Levora Dredge, MD  fluticasone (FLONASE) 50 MCG/ACT nasal spray Place 2 sprays into both nostrils daily. 02/10/15   Shanker Levora Dredge, MD  loratadine (CLARITIN) 10 MG tablet Take 1 tablet (10 mg total) by mouth daily. 02/10/15   Shanker Levora Dredge, MD  nicotine (NICODERM CQ - DOSED IN MG/24 HOURS) 21 mg/24hr patch Place 1 patch (21 mg total) onto the skin daily. 02/10/15   Shanker Levora Dredge, MD  pantoprazole (PROTONIX) 40 MG tablet Take 1 tablet (40 mg total) by mouth daily. 02/10/15   Shanker Levora Dredge, MD  predniSONE (DELTASONE) 10 MG tablet Take 4 tablets (40 mg) daily for 2 days, then, Take 3 tablets (30 mg) daily for 2 days, then, Take 2  tablets (20 mg) daily for 2 days, then, Take 1 tablets (10 mg) daily for 1 days, then stop 02/10/15   Maretta Bees, MD  traMADol (ULTRAM) 50 MG tablet Take 1 tablet (50 mg total) by mouth every 6 (six) hours as needed for moderate pain (cough). 02/10/15   Shanker Levora Dredge, MD   BP 128/75 mmHg  Pulse 99  Temp(Src) 97.7 F (36.5 C) (Oral)  Resp 20  Ht  (1.753 m)  Wt 157 lb 4.8 oz (71.351 kg)  BMI 23.22 kg/m2  SpO2 97% Physical Exam  Constitutional: He is oriented to person, place, and time. He appears well-developed and well-nourished. No distress.  Not dyspneic with conversation. He does not appear distressed.  HENT:  Head: Normocephalic.  Eyes: Conjunctivae are normal. Pupils are equal, round, and reactive to light. No scleral icterus.  Neck: Normal range of motion. Neck supple. No thyromegaly present.  Cardiovascular: Normal rate and regular rhythm.  Exam reveals no gallop and no friction rub.   No murmur heard. Pulmonary/Chest: Effort normal and breath sounds normal. No respiratory distress. He has no wheezes. He has no rales.  Slightly diminished bibasilar breath sounds. No increased work of breathing.  Abdominal: Soft. Bowel sounds are normal. He exhibits no distension. There is no tenderness. There is no rebound.  Musculoskeletal: Normal range of motion.  Neurological: He is alert and oriented to person, place, and time.  Skin: Skin is warm and dry. No rash noted.     Psychiatric: He has a normal mood and affect. His behavior is normal.    ED Course  Procedures (including critical care time) Labs Review Labs Reviewed  CBC WITH DIFFERENTIAL/PLATELET - Abnormal; Notable for the following:    Hemoglobin 12.9 (*)    All other components within normal limits  BASIC METABOLIC PANEL - Abnormal; Notable for the following:    Glucose, Bld 160 (*)    GFR calc non Af Amer 80 (*)    All other components within normal limits  BRAIN NATRIURETIC PEPTIDE - Abnormal; Notable  for the following:    B Natriuretic Peptide 1739.5 (*)    All other components within normal limits  GLUCOSE, CAPILLARY - Abnormal; Notable for the following:    Glucose-Capillary 165 (*)    All other components within normal limits  TROPONIN I  TROPONIN I  TROPONIN I  TSH  I-STAT TROPOININ, ED    Imaging Review Dg Chest 2 View  02/24/2015   CLINICAL DATA:  Shortness of breath, coughing.  EXAM: CHEST  2 VIEW  COMPARISON:  February 08, 2015.  FINDINGS: Stable cardiomediastinal silhouette. No pneumothorax is noted. Stable moderate size bilateral pleural effusions are noted compared to prior exam mild interstitial densities are noted throughout both lungs suggesting pulmonary edema. Degenerative change of both acromioclavicular joints is noted.  IMPRESSION: Stable cardiomegaly with associated bilateral pulmonary edema and pleural effusions concerning for congestive heart failure.   Electronically Signed   By: Lupita RaiderJames  Green Jr, M.D.   On: 02/24/2015 16:46   Dg Foot Complete Left  02/24/2015   CLINICAL DATA:  Patient hospitalized 2 weeks ago for foot infection. History of diabetes. Now with sores involving the left foot. No known injury.  EXAM: LEFT FOOT - COMPLETE 3+ VIEW  COMPARISON:  11/28/2014  FINDINGS: There is apparent soft tissue defect involving the lateral mid aspect of the foot. This finding is without associated radiopaque foreign body, subcutaneous emphysema or discrete area of osteolysis to suggest osteomyelitis.  No fracture or dislocation. Joint spaces are preserved. No erosions. Minimal enthesopathic change of the Achilles tendon insertion site. Distal vascular calcifications compatible with provided history of diabetes.  IMPRESSION: Soft tissue defect involving the lateral mid aspect of the foot without radiopaque foreign body or radiographic evidence of osteomyelitis.   Electronically Signed   By: Simonne ComeJohn  Watts M.D.   On: 02/24/2015 16:45     EKG Interpretation   Date/Time:  Thursday  February 24 2015 18:22:27 EDT Ventricular Rate:  93 PR Interval:  155 QRS Duration: 113 QT Interval:  412 QTC Calculation: 512 R Axis:   -24 Text Interpretation:  Sinus rhythm Atrial premature complexes Left atrial  enlargement LVH with secondary repolarization abnormality Anterior Q  waves, possibly due to LVH Prolonged QT interval Confirmed by Fayrene FearingJAMES  MD,  Christina Gintz ([id number]) on 02/24/2015 6:36:28 PM  MDM   Final diagnoses:  Acute on chronic congestive heart failure, unspecified congestive heart failure type    Patient initially 100% on room air. We'll try him off with his O2. Labs requested. Case manager involved. Will initiate [facility] involvement with the patient to help ensure follow-up. Labs are pending. Plan orthostatic check ambulation. He'll need wound care for his foot. Plan reassessment after the above.  On recheck patient has desaturations to the mid 80s breathing room air simply standing at the bedside. Chest x-ray does show similar pattern of CHF versus his last admission. Discussed the case with hospitalist. He will be admission. Prior to this I discussed the case with case management and arrangements are being put in place for patient to have outpatient follow-up, medication assistance, [facility] follow-up.    Rolland Porter, MD 02/24/15 [phone]

## 2015-02-24 NOTE — H&P (Signed)
[facility]                                                                                    Patient Demographics  Russell Hamilton, is a 74 y.o. male  CSN: [id number]  MRN: [id number]  DOB - [birth year]  Admit Date - 02/24/2015  Outpatient Primary MD for the patient is No PCP Per Patient   With History of -  Past Medical History  Diagnosis Date  . Hypertension   . Chronic systolic CHF (congestive heart failure)     a. 2D ECHO 08/13/14: EF 40%, diffuse hypokinesis possibly worse in the inferior wall. Mild MR, mild LA dilation, PA pressure 50. Trivial pericardial effusion.  . Acute systolic CHF (congestive heart failure), NYHA class 4 08/12/2014  . Type II diabetes mellitus   . GERD (gastroesophageal reflux disease)   . Stroke 2010    "memory problems since; right leg and left arm weakness since" (02/08/2015)  . Peripheral neuropathy     /notes 08/12/2014      Past Surgical History  Procedure Laterality Date  . Carotid endarterectomy Right 2010  . Hand ligament reconstruction Right ?1970's    "pinky"    in for   Chief Complaint  Patient presents with  . Chest Pain  . Shortness of Breath  . Leg Pain     HPI  Russell Hamilton  is a 74 y.o. male, past medical history significant for chronic congestive heart failure, hypertension and diabetes mellitus type 2 presenting today with increased shortness of breath and swelling in the lower extremities. Patient presented to urgent care center originally for left pain and he was found to be in failure and transferred here for further management . No history of chest pains, fever or chills, but he feels pins and needles in his left chest and arm . The patient has not been compliant with his medications at home and he is only taking Lasix since he began afford it . Patient still smokes.    Review of Systems    In addition to the HPI above,  No Fever-chills No Headache, No changes with Vision or hearing, No problems  swallowing food or Liquids, No Chest pain, Cough or Shortness of Breath, No Abdominal pain, No Nausea or Vommitting, Bowel movements are regular, No Blood in stool or Urine, No dysuria, No new skin rashes or bruises, No new joints pains-aches,  No new weakness, tingling, numbness in any extremity, No recent weight gain or loss, No polyuria, polydypsia or polyphagia, No significant Mental Stressors.  A full 10 point Review of Systems was done, except as stated above, all other Review of Systems were negative.   Social History History  Substance Use Topics  . Smoking status: Current Every Day Smoker -- 0.75 packs/day for 56 years    Types: Cigarettes  . Smokeless tobacco: Never Used  . Alcohol Use: Yes     Comment: 02/08/2015 "I might have a beer q 6 months"     Family History Family History  Problem Relation Age of Onset  . Kidney disease Mother     Prior to Admission medications   Medication Sig Start  Date End Date Taking? Authorizing Provider  aspirin 81 MG chewable tablet Chew 1 tablet (81 mg total) by mouth daily. 02/10/15  Yes Shanker Levora Dredge, MD  benzonatate (TESSALON) 200 MG capsule Take 1 capsule (200 mg total) by mouth 2 (two) times daily. 02/10/15  Yes Shanker Levora Dredge, MD  carvedilol (COREG) 6.25 MG tablet Take 1 tablet (6.25 mg total) by mouth 2 (two) times daily with a meal. 02/10/15  Yes Shanker Levora Dredge, MD  furosemide (LASIX) 40 MG tablet Take 1 tablet (40 mg total) by mouth daily. 02/10/15  Yes Shanker Levora Dredge, MD  gabapentin (NEURONTIN) 300 MG capsule Take 1 capsule (300 mg total) by mouth 3 (three) times daily. 02/10/15  Yes Shanker Levora Dredge, MD  glimepiride (AMARYL) 2 MG tablet Take 1 tablet (2 mg total) by mouth daily with breakfast. 02/10/15  Yes Shanker Levora Dredge, MD  losartan (COZAAR) 25 MG tablet Take 1 tablet (25 mg total) by mouth daily. 02/10/15  Yes Shanker Levora Dredge, MD  potassium chloride SA (K-DUR,KLOR-CON) 20 MEQ tablet Take 2 tablets (40 mEq total) by  mouth 2 (two) times daily. 02/10/15  Yes Shanker Levora Dredge, MD  albuterol (PROVENTIL HFA;VENTOLIN HFA) 108 (90 BASE) MCG/ACT inhaler Inhale 2 puffs into the lungs every 6 (six) hours as needed for wheezing or shortness of breath. 02/10/15   Shanker Levora Dredge, MD  azithromycin (ZITHROMAX) 500 MG tablet Take 1 tablet (500 mg total) by mouth daily. 02/10/15   Shanker Levora Dredge, MD  chlorpheniramine-HYDROcodone (TUSSIONEX) 10-8 MG/5ML LQCR Take 5 mLs by mouth every 12 (twelve) hours. 02/10/15   Shanker Levora Dredge, MD  collagenase (SANTYL) ointment Apply topically daily. 02/10/15   Shanker Levora Dredge, MD  fluticasone (FLONASE) 50 MCG/ACT nasal spray Place 2 sprays into both nostrils daily. 02/10/15   Shanker Levora Dredge, MD  loratadine (CLARITIN) 10 MG tablet Take 1 tablet (10 mg total) by mouth daily. 02/10/15   Shanker Levora Dredge, MD  nicotine (NICODERM CQ - DOSED IN MG/24 HOURS) 21 mg/24hr patch Place 1 patch (21 mg total) onto the skin daily. 02/10/15   Shanker Levora Dredge, MD  pantoprazole (PROTONIX) 40 MG tablet Take 1 tablet (40 mg total) by mouth daily. 02/10/15   Shanker Levora Dredge, MD  predniSONE (DELTASONE) 10 MG tablet Take 4 tablets (40 mg) daily for 2 days, then, Take 3 tablets (30 mg) daily for 2 days, then, Take 2 tablets (20 mg) daily for 2 days, then, Take 1 tablets (10 mg) daily for 1 days, then stop 02/10/15   Maretta Bees, MD  traMADol (ULTRAM) 50 MG tablet Take 1 tablet (50 mg total) by mouth every 6 (six) hours as needed for moderate pain (cough). 02/10/15   Shanker Levora Dredge, MD    No Known Allergies  Physical Exam  Vitals  Blood pressure 150/83, pulse 102, temperature 97.7 F (36.5 C), temperature source Oral, resp. rate 18, height 5' 9.5" (1.765 m), weight 72.179 kg (159 lb 2 oz), SpO2 96 %.   1. General well-developed, well-nourished gentleman, very pleasant  2. Normal affect and insight, Not Suicidal or Homicidal, Awake Alert, Oriented X 3.  3. No F.N deficits, grossly  4. Ears and Eyes  appear Normal, Conjunctivae clear, PERRLA. Moist Oral Mucosa.  5. Supple Neck, No JVD, No cervical lymphadenopathy appriciated, No Carotid Bruits.  6. Symmetrical Chest wall movement, bilateral crackles.  7. RRR, No Gallops, Rubs or Murmurs, No Parasternal Heave.  8. Positive Bowel Sounds, Abdomen Soft,  Non tender, No organomegaly appriciated,No rebound -guarding or rigidity.  9.  No Cyanosis, Normal Skin Turgor, multiple colonic ulcers noted in his lower extremities.  10. Good muscle tone,  joints appear normal , no effusions, Normal ROM.  11. No Palpable Lymph Nodes in Neck or Axillae    Data Review  CBC  Recent Labs Lab 02/24/15 1610 02/24/15 1911  WBC  --  4.5  HGB 15.0 12.9*  HCT 44.0 39.6  PLT  --  175  MCV  --  89.2  MCH  --  29.1  MCHC  --  32.6  RDW  --  15.3  LYMPHSABS  --  1.1  MONOABS  --  0.4  EOSABS  --  0.0  BASOSABS  --  0.0   ------------------------------------------------------------------------------------------------------------------  Chemistries   Recent Labs Lab 02/24/15 1610 02/24/15 1911  NA 138 135  K 3.9 3.6  CL 99 100  CO2  --  28  GLUCOSE 184* 160*  BUN 12 11  CREATININE 1.00 0.99  CALCIUM  --  8.7   ------------------------------------------------------------------------------------------------------------------ estimated creatinine clearance is 69.6 mL/min (by C-G formula based on Cr of 0.99). ------------------------------------------------------------------------------------------------------------------ No results for input(s): TSH, T4TOTAL, T3FREE, THYROIDAB in the last 72 hours.  Invalid input(s): FREET3   Coagulation profile No results for input(s): INR, PROTIME in the last 168 hours. ------------------------------------------------------------------------------------------------------------------- No results for input(s): DDIMER in the last 72  hours. -------------------------------------------------------------------------------------------------------------------  Cardiac Enzymes No results for input(s): CKMB, TROPONINI, MYOGLOBIN in the last 168 hours.  Invalid input(s): CK ------------------------------------------------------------------------------------------------------------------ Invalid input(s): POCBNP   ---------------------------------------------------------------------------------------------------------------  Urinalysis    Component Value Date/Time   COLORURINE YELLOW 11/20/2014 0348   APPEARANCEUR CLEAR 11/20/2014 0348   LABSPEC 1.007 11/20/2014 0348   PHURINE 5.5 11/20/2014 0348   GLUCOSEU NEGATIVE 11/20/2014 0348   HGBUR NEGATIVE 11/20/2014 0348   BILIRUBINUR NEGATIVE 11/20/2014 0348   KETONESUR NEGATIVE 11/20/2014 0348   PROTEINUR NEGATIVE 11/20/2014 0348   UROBILINOGEN 0.2 11/20/2014 0348   NITRITE NEGATIVE 11/20/2014 0348   LEUKOCYTESUR NEGATIVE 11/20/2014 0348    ----------------------------------------------------------------------------------------------------------------   Imaging results:   Dg Chest 2 View  02/24/2015   CLINICAL DATA:  Shortness of breath, coughing.  EXAM: CHEST  2 VIEW  COMPARISON:  February 08, 2015.  FINDINGS: Stable cardiomediastinal silhouette. No pneumothorax is noted. Stable moderate size bilateral pleural effusions are noted compared to prior exam mild interstitial densities are noted throughout both lungs suggesting pulmonary edema. Degenerative change of both acromioclavicular joints is noted.  IMPRESSION: Stable cardiomegaly with associated bilateral pulmonary edema and pleural effusions concerning for congestive heart failure.   Electronically Signed   By: Lupita RaiderJames  Green Jr, M.D.   On: 02/24/2015 16:46   Dg Chest 2 View  02/08/2015   CLINICAL DATA:  Cough and shortness of breath for 1 day. Swelling and numbness of the feet.  EXAM: CHEST  2 VIEW  COMPARISON:   12/11/2014  FINDINGS: Bibasilar airspace opacities obscuring the hemidiaphragms, similar to prior. Mild enlargement of the cardiopericardial silhouette moderate size bilateral pleural effusions.  Thoracic spondylosis.  Emphysema.  IMPRESSION: 1. Similar appearance of cardiomegaly and moderate size bilateral pleural effusions with passive atelectasis compared to 12/11/14.   Electronically Signed   By: Gaylyn RongWalter  Liebkemann M.D.   On: 02/08/2015 15:54   Dg Foot Complete Left  02/24/2015   CLINICAL DATA:  Patient hospitalized 2 weeks ago for foot infection. History of diabetes. Now with sores involving the left foot. No known injury.  EXAM: LEFT FOOT -  COMPLETE 3+ VIEW  COMPARISON:  11/28/2014  FINDINGS: There is apparent soft tissue defect involving the lateral mid aspect of the foot. This finding is without associated radiopaque foreign body, subcutaneous emphysema or discrete area of osteolysis to suggest osteomyelitis.  No fracture or dislocation. Joint spaces are preserved. No erosions. Minimal enthesopathic change of the Achilles tendon insertion site. Distal vascular calcifications compatible with provided history of diabetes.  IMPRESSION: Soft tissue defect involving the lateral mid aspect of the foot without radiopaque foreign body or radiographic evidence of osteomyelitis.   Electronically Signed   By: Simonne Come M.D.   On: 02/24/2015 16:45    My personal review of EKG: Normal sinus rhythm with LVH and prolonged QT,? Left atrial enlargement    Assessment & Plan   1. Congestive heart failure; acute episode, systolic; ejection fraction 40% 2. Chronic lower extremity ulcers 3. History of hypertension 4. Diabetes mellitus type 2 5. Noncompliance  Plan Change Lasix to IV Accurate intake output Serial troponins Social worker consulted and has seen the patient   DVT Prophylaxis Lovenox  AM Labs Ordered, also please review Full Orders  Family Communication: Admission, patients condition and  plan of care including tests being ordered have been discussed with the patient and niece who indicate understanding and agree with the plan and Code Status.  Code Status full  Disposition Plan: Home with home health or support  Time spent in minutes : 38 minutes  Condition GUARDED   @SIGNATURE @

## 2015-02-24 NOTE — ED Notes (Signed)
Admitting MD at bedside.

## 2015-02-25 DIAGNOSIS — E1122 Type 2 diabetes mellitus with diabetic chronic kidney disease: Secondary | ICD-10-CM | POA: Diagnosis present

## 2015-02-25 DIAGNOSIS — R197 Diarrhea, unspecified: Secondary | ICD-10-CM | POA: Diagnosis present

## 2015-02-25 DIAGNOSIS — I200 Unstable angina: Secondary | ICD-10-CM

## 2015-02-25 DIAGNOSIS — Z7952 Long term (current) use of systemic steroids: Secondary | ICD-10-CM | POA: Diagnosis not present

## 2015-02-25 DIAGNOSIS — Z6821 Body mass index (BMI) 21.0-21.9, adult: Secondary | ICD-10-CM | POA: Diagnosis not present

## 2015-02-25 DIAGNOSIS — T380X5A Adverse effect of glucocorticoids and synthetic analogues, initial encounter: Secondary | ICD-10-CM | POA: Diagnosis present

## 2015-02-25 DIAGNOSIS — Z66 Do not resuscitate: Secondary | ICD-10-CM | POA: Diagnosis present

## 2015-02-25 DIAGNOSIS — J9601 Acute respiratory failure with hypoxia: Secondary | ICD-10-CM | POA: Diagnosis present

## 2015-02-25 DIAGNOSIS — I25110 Atherosclerotic heart disease of native coronary artery with unstable angina pectoris: Secondary | ICD-10-CM | POA: Diagnosis present

## 2015-02-25 DIAGNOSIS — L97529 Non-pressure chronic ulcer of other part of left foot with unspecified severity: Secondary | ICD-10-CM | POA: Diagnosis present

## 2015-02-25 DIAGNOSIS — I5023 Acute on chronic systolic (congestive) heart failure: Secondary | ICD-10-CM | POA: Diagnosis not present

## 2015-02-25 DIAGNOSIS — R0602 Shortness of breath: Secondary | ICD-10-CM | POA: Diagnosis present

## 2015-02-25 DIAGNOSIS — E43 Unspecified severe protein-calorie malnutrition: Secondary | ICD-10-CM | POA: Diagnosis present

## 2015-02-25 DIAGNOSIS — Z8673 Personal history of transient ischemic attack (TIA), and cerebral infarction without residual deficits: Secondary | ICD-10-CM | POA: Diagnosis not present

## 2015-02-25 DIAGNOSIS — G629 Polyneuropathy, unspecified: Secondary | ICD-10-CM | POA: Diagnosis present

## 2015-02-25 DIAGNOSIS — I509 Heart failure, unspecified: Secondary | ICD-10-CM | POA: Diagnosis not present

## 2015-02-25 DIAGNOSIS — E1165 Type 2 diabetes mellitus with hyperglycemia: Secondary | ICD-10-CM | POA: Diagnosis present

## 2015-02-25 DIAGNOSIS — Z7982 Long term (current) use of aspirin: Secondary | ICD-10-CM | POA: Diagnosis not present

## 2015-02-25 DIAGNOSIS — R079 Chest pain, unspecified: Secondary | ICD-10-CM | POA: Diagnosis not present

## 2015-02-25 DIAGNOSIS — F17210 Nicotine dependence, cigarettes, uncomplicated: Secondary | ICD-10-CM | POA: Diagnosis present

## 2015-02-25 DIAGNOSIS — N179 Acute kidney failure, unspecified: Secondary | ICD-10-CM | POA: Diagnosis present

## 2015-02-25 DIAGNOSIS — N183 Chronic kidney disease, stage 3 (moderate): Secondary | ICD-10-CM | POA: Diagnosis present

## 2015-02-25 DIAGNOSIS — J449 Chronic obstructive pulmonary disease, unspecified: Secondary | ICD-10-CM | POA: Diagnosis present

## 2015-02-25 DIAGNOSIS — I129 Hypertensive chronic kidney disease with stage 1 through stage 4 chronic kidney disease, or unspecified chronic kidney disease: Secondary | ICD-10-CM | POA: Diagnosis present

## 2015-02-25 DIAGNOSIS — D509 Iron deficiency anemia, unspecified: Secondary | ICD-10-CM | POA: Diagnosis present

## 2015-02-25 DIAGNOSIS — L84 Corns and callosities: Secondary | ICD-10-CM | POA: Diagnosis present

## 2015-02-25 DIAGNOSIS — I10 Essential (primary) hypertension: Secondary | ICD-10-CM | POA: Diagnosis not present

## 2015-02-25 DIAGNOSIS — Z9119 Patient's noncompliance with other medical treatment and regimen: Secondary | ICD-10-CM | POA: Diagnosis present

## 2015-02-25 DIAGNOSIS — E871 Hypo-osmolality and hyponatremia: Secondary | ICD-10-CM | POA: Diagnosis present

## 2015-02-25 DIAGNOSIS — I5043 Acute on chronic combined systolic (congestive) and diastolic (congestive) heart failure: Secondary | ICD-10-CM | POA: Diagnosis present

## 2015-02-25 DIAGNOSIS — I255 Ischemic cardiomyopathy: Secondary | ICD-10-CM | POA: Diagnosis present

## 2015-02-25 DIAGNOSIS — K219 Gastro-esophageal reflux disease without esophagitis: Secondary | ICD-10-CM | POA: Diagnosis present

## 2015-02-25 LAB — TSH: TSH: 1.412 u[IU]/mL (ref 0.350–4.500)

## 2015-02-25 LAB — HEPARIN LEVEL (UNFRACTIONATED): HEPARIN UNFRACTIONATED: 0.3 [IU]/mL (ref 0.30–0.70)

## 2015-02-25 LAB — GLUCOSE, CAPILLARY
GLUCOSE-CAPILLARY: 367 mg/dL — AB (ref 70–99)
GLUCOSE-CAPILLARY: 308 mg/dL — AB (ref 70–99)
GLUCOSE-CAPILLARY: 209 mg/dL — AB (ref 70–99)
GLUCOSE-CAPILLARY: 120 mg/dL — AB (ref 70–99)

## 2015-02-25 LAB — TROPONIN I
TROPONIN I: 0.03 ng/mL (ref <0.031)
Troponin I: 0.03 ng/mL (ref <0.031)
Troponin I: 0.03 ng/mL (ref <0.031)

## 2015-02-25 MED ORDER — INSULIN ASPART 100 UNIT/ML ~~LOC~~ SOLN
0.0000 [IU] | Freq: Three times a day (TID) | SUBCUTANEOUS | Status: DC
  Administered 2015-02-25: 5 [IU] via SUBCUTANEOUS
  Administered 2015-02-26: 3 [IU] via SUBCUTANEOUS
  Administered 2015-02-27 (×2): 5 [IU] via SUBCUTANEOUS
  Administered 2015-02-27: 11 [IU] via SUBCUTANEOUS
  Administered 2015-02-28: 3 [IU] via SUBCUTANEOUS
  Administered 2015-02-28: 15 [IU] via SUBCUTANEOUS
  Administered 2015-02-28: 5 [IU] via SUBCUTANEOUS
  Administered 2015-03-01 (×2): 3 [IU] via SUBCUTANEOUS

## 2015-02-25 MED ORDER — GLUCERNA SHAKE PO LIQD
237.0000 mL | Freq: Two times a day (BID) | ORAL | Status: DC
  Administered 2015-02-25 – 2015-03-01 (×7): 237 mL via ORAL

## 2015-02-25 MED ORDER — HYDROCERIN EX CREA: TOPICAL_CREAM | Freq: Two times a day (BID) | CUTANEOUS | Status: DC

## 2015-02-25 MED ORDER — MOMETASONE FURO-FORMOTEROL FUM 100-5 MCG/ACT IN AERO
2.0000 | INHALATION_SPRAY | Freq: Two times a day (BID) | RESPIRATORY_TRACT | Status: DC
  Administered 2015-02-25 – 2015-03-01 (×7): 2 via RESPIRATORY_TRACT
  Filled 2015-02-25 (×2): qty 8.8

## 2015-02-25 MED ORDER — NITROGLYCERIN 0.4 MG SL SUBL
SUBLINGUAL_TABLET | SUBLINGUAL | Status: AC
  Administered 2015-02-25: 11:00:00
  Filled 2015-02-25: qty 1

## 2015-02-25 MED ORDER — HEPARIN BOLUS VIA INFUSION
3500.0000 [IU] | Freq: Once | INTRAVENOUS | Status: AC
Start: 2015-02-25 — End: 2015-02-25
  Administered 2015-02-25: 3500 [IU] via INTRAVENOUS
  Filled 2015-02-25: qty 3500

## 2015-02-25 MED ORDER — HYDROCERIN EX CREA
TOPICAL_CREAM | Freq: Every day | CUTANEOUS | Status: DC
  Administered 2015-02-26 – 2015-03-01 (×4): via TOPICAL
  Filled 2015-02-25: qty 113

## 2015-02-25 MED ORDER — ZOLPIDEM TARTRATE 5 MG PO TABS
5.0000 mg | ORAL_TABLET | Freq: Every day | ORAL | Status: DC
  Filled 2015-02-25: qty 1

## 2015-02-25 MED ORDER — ATORVASTATIN CALCIUM 80 MG PO TABS
80.0000 mg | ORAL_TABLET | Freq: Every day | ORAL | Status: DC
  Administered 2015-02-25 – 2015-02-28 (×4): 80 mg via ORAL
  Filled 2015-02-25 (×5): qty 1

## 2015-02-25 MED ORDER — HEPARIN (PORCINE) IN NACL 100-0.45 UNIT/ML-% IJ SOLN
1350.0000 [IU]/h | INTRAMUSCULAR | Status: DC
  Administered 2015-02-25 (×2): 1250 [IU]/h via INTRAVENOUS
  Administered 2015-02-26: 1350 [IU]/h via INTRAVENOUS
  Filled 2015-02-25 (×2): qty 250

## 2015-02-25 MED ORDER — INSULIN ASPART 100 UNIT/ML ~~LOC~~ SOLN
0.0000 [IU] | Freq: Every day | SUBCUTANEOUS | Status: DC
  Administered 2015-02-26 – 2015-02-27 (×2): 2 [IU] via SUBCUTANEOUS
  Administered 2015-02-28: 3 [IU] via SUBCUTANEOUS

## 2015-02-25 MED ORDER — NITROGLYCERIN 0.4 MG SL SUBL
SUBLINGUAL_TABLET | SUBLINGUAL | Status: AC
  Administered 2015-02-25: 0.4 mg
  Filled 2015-02-25: qty 3

## 2015-02-25 NOTE — Progress Notes (Addendum)
ANTICOAGULATION CONSULT NOTE - Initial Consult  Pharmacy Consult for heparin Indication: chest pain/ACS  No Known Allergies  Patient Measurements: Height:  (175.3 cm) Weight: 157 lb 9.6 oz (71.487 kg) (scale C) IBW/kg (Calculated) : 70.7 Heparin Dosing Weight: 71 kg  Vital Signs: Temp: 97.9 F (36.6 C) (04/22 0632) Temp Source: Oral (04/22 5409) BP: 149/88 mmHg (04/22 0900) Pulse Rate: 98 (04/22 0900)  Labs:  Recent Labs  02/24/15 1610 02/24/15 1911 02/24/15 2313 02/25/15 0420  HGB 15.0 12.9*  --   --   HCT 44.0 39.6  --   --   PLT  --  175  --   --   CREATININE 1.00 0.99  --   --   TROPONINI  --   --  0.03 0.03    Estimated Creatinine Clearance: 68.4 mL/min (by C-G formula based on Cr of 0.99).   Medical History: Past Medical History  Diagnosis Date  . Hypertension   . Chronic systolic CHF (congestive heart failure)     a. 2D ECHO 08/13/14: EF 40%, diffuse hypokinesis possibly worse in the inferior wall. Mild MR, mild LA dilation, PA pressure 50. Trivial pericardial effusion.  . Acute systolic CHF (congestive heart failure), NYHA class 4 08/12/2014  . Type II diabetes mellitus   . GERD (gastroesophageal reflux disease)   . Peripheral neuropathy     [name] 08/12/2014  . Stroke 2010    "memory problems since; right leg and left arm weakness since" (02/08/2015)  . Ulcers of both lower extremities     [name] 02/24/2015  . Noncompliance     /notes 02/24/2015    Medications:  Prescriptions prior to admission  Medication Sig Dispense Refill Last Dose  . aspirin 81 MG chewable tablet Chew 1 tablet (81 mg total) by mouth daily. 30 tablet 3 02/24/2015 at Unknown time  . benzonatate (TESSALON) 200 MG capsule Take 1 capsule (200 mg total) by mouth 2 (two) times daily. 10 capsule 0 02/24/2015 at Unknown time  . carvedilol (COREG) 6.25 MG tablet Take 1 tablet (6.25 mg total) by mouth 2 (two) times daily with a meal. 60 tablet 0 02/24/2015 at 0800  . furosemide (LASIX) 40  MG tablet Take 1 tablet (40 mg total) by mouth daily. 30 tablet 1 02/24/2015 at Unknown time  . gabapentin (NEURONTIN) 300 MG capsule Take 1 capsule (300 mg total) by mouth 3 (three) times daily. 90 capsule 0 02/24/2015 at Unknown time  . glimepiride (AMARYL) 2 MG tablet Take 1 tablet (2 mg total) by mouth daily with breakfast. 30 tablet 0 02/24/2015 at Unknown time  . losartan (COZAAR) 25 MG tablet Take 1 tablet (25 mg total) by mouth daily. 30 tablet 0 02/24/2015 at Unknown time  . potassium chloride SA (K-DUR,KLOR-CON) 20 MEQ tablet Take 2 tablets (40 mEq total) by mouth 2 (two) times daily. 120 tablet 0 02/24/2015 at Unknown time  . albuterol (PROVENTIL HFA;VENTOLIN HFA) 108 (90 BASE) MCG/ACT inhaler Inhale 2 puffs into the lungs every 6 (six) hours as needed for wheezing or shortness of breath. 1 Inhaler 0 not started  . azithromycin (ZITHROMAX) 500 MG tablet Take 1 tablet (500 mg total) by mouth daily. 3 tablet 0 not started  . chlorpheniramine-HYDROcodone (TUSSIONEX) 10-8 MG/5ML LQCR Take 5 mLs by mouth every 12 (twelve) hours. 115 mL 0 not started  . collagenase (SANTYL) ointment Apply topically daily. 15 g 0 not started  . fluticasone (FLONASE) 50 MCG/ACT nasal spray Place 2 sprays into both nostrils  daily. 9.9 g 0 not started  . loratadine (CLARITIN) 10 MG tablet Take 1 tablet (10 mg total) by mouth daily. 30 tablet 0 not started  . nicotine (NICODERM CQ - DOSED IN MG/24 HOURS) 21 mg/24hr patch Place 1 patch (21 mg total) onto the skin daily. 28 patch 0 not started  . pantoprazole (PROTONIX) 40 MG tablet Take 1 tablet (40 mg total) by mouth daily. 30 tablet 0 not started  . predniSONE (DELTASONE) 10 MG tablet Take 4 tablets (40 mg) daily for 2 days, then, Take 3 tablets (30 mg) daily for 2 days, then, Take 2 tablets (20 mg) daily for 2 days, then, Take 1 tablets (10 mg) daily for 1 days, then stop 19 tablet 0   . traMADol (ULTRAM) 50 MG tablet Take 1 tablet (50 mg total) by mouth every 6 (six)  hours as needed for moderate pain (cough). 15 tablet 0 not started    Assessment: 74 yo man to start heparin therapy for USAP.  He was not on anticoagulants pta.  Baseline Hg 12.9, PTLC 175 Goal of Therapy:  Heparin level 0.3-0.7 units/ml Monitor platelets by anticoagulation protocol: Yes   Plan:  Heparin bolus 3500 units and drip at 1000 units/hr Check heparin level in 6 hours Daily HL and CBC while on heparin Monitor for bleeding complications  Thanks for allowing pharmacy to be a part of this patient's care.  Talbert CageLora Areya Lemmerman, PharmD Clinical Pharmacist, [phone] 02/25/2015,12:33 PM  Was previously on 1350 units/hr heparin with therapeutic HL.  Will increase to this rate.

## 2015-02-25 NOTE — Progress Notes (Signed)
Pt refused the following meds (Dr. Malachi BondsShort is aware):  Potassium, Nicoderm patch, gabapentin, Lovenox.

## 2015-02-25 NOTE — Consult Note (Addendum)
WOC wound consult note [facility], refer to progress notes on 4/6. Reason for Consult: Consult requested for BLE. Pt has chronic wounds which have been present several months; they started as a burn from a heating pad and have greatly improved according since last assessment. Wound type: Left outer foot with full thickness wound; tightly adhered eschar, 4X3cm, no odor, drainage, or fluctuance. Removed dry calloused edges surrounding wound with scissors.  No odor, drainage, or open wound to this site. Left inner ankle with dry callous surrounding partail thickness wound, .2X.2X.1cm, pink and dry without odor or drainage. Left small outer toe with healing full thickness wound, .3X.1X.1cm, dark red and dry without odor or drainage. Right inner ankle with dry callous surrounding partail thickness wound, .2X.2X.1cm, pink and dry without odor or drainage. Left outer heel with dry callous, edges loose and remove easily.  Dry yellow wound bed .5X.5cm.   Right heel with dry callous, edges loose and remove easily.  Dry yellow wound bed .3X.5cm.  These are NOT  pressure ulcers to bilat heels; pt states he walks on his heels to avoid pressure on his toes, and he had increased edema during the past few weeks which caused his shoes to fit too tightly in these areas.  Dressing procedure/placement/frequency: It is best practice to leave dry stable eschar intact to left lower extremity. Eucerin cream to promote moist healing to other sites. Discussed plan of care with patient and he denies further questions. Please re-consult if further assistance is needed. Thank-you,  Cammie Mcgeeawn Javayah Magaw MSN, RN, CWOCN, ChurchillWCN-AP, CNS [phone]

## 2015-02-25 NOTE — Progress Notes (Signed)
Pt was set up to watch CHF video;  Also, gave the "Living with Heart Failure" booklet for his review.  Educated him on the importance of monitoring the s/s of CHF.

## 2015-02-25 NOTE — Care Management Note (Signed)
    Page 1 of 2   03/01/2015     5:05:57 PM CARE MANAGEMENT NOTE 03/01/2015  Patient:  [name],Jeromiah   Account Number:  [id number]  Date Initiated:  02/25/2015  Documentation initiated by:  Laquon Emel  Subjective/Objective Assessment:   Pt adm on 02/24/15 with chest pain, CHF.  PTA, pt resides at home alone.  He is noncompliant with appts, and has had multiple admissions.     Action/Plan:   Will follow for dc needs as pt progresses. Would recommend HHRN at dc for follow up of CHF, if pt will agree.   Anticipated DC Date:  03/01/2015   Anticipated DC Plan:  SKILLED NURSING FACILITY  In-house referral  Clinical Social Worker      DC Associate Professorlanning Services  CM consult  [facility]  PCP issues      Choice offered to / List presented to:  C-1 Patient           Status of service:  Completed, signed off Medicare Important Message given?  YES (If response is "NO", the following Medicare IM given date fields will be blank) Date Medicare IM given:  02/28/2015 Medicare IM given by:  [facility] Date Additional Medicare IM given:   Additional Medicare IM given by:    Discharge Disposition:  HOME/SELF CARE  Per UR Regulation:  Reviewed for med. necessity/level of care/duration of stay  If discussed at Long Length of Stay Meetings, dates discussed:    Comments:  03/01/15 Sidney AceJulie Gradyn Shein, RN, BSN [phone] Pt for dc today; Pt unable to place in SNF, as current insurance plan does not service this area--[place].  The same applies [facility]; [facility] or any other HH agency unable to pick [facility], as insurance is out of network for all local agencies.  I have discussed this with pt and niece during previous admissions.  Pt states that his insurance is paying hosp bills and Rx, but I am uncertain of this.  Pt states he does not need HH , and will be "fine".  02/28/2015 1600 NCM spoke to pt and [facility] Provided pt with [facility] list. [facility]. States no DME is needed. Waiting final recommendations for home. Contacted [facility] with new referral. Isidoro DonningAlesia Shavis RN CCM Case Mgmt phone [phone]  02/25/15 Sidney AceJulie Nahun Kronberg, RN, BSN [phone] [facility] 4[facility].  Pt prefers afternoon appt due to transportation issues.

## 2015-02-25 NOTE — Progress Notes (Addendum)
ANTICOAGULATION CONSULT NOTE  Pharmacy Consult for heparin Indication: chest pain/ACS  No Known Allergies  Patient Measurements: Height: 5\' 9"  (175.3 cm) Weight: 157 lb 9.6 oz (71.487 kg) (scale C) IBW/kg (Calculated) : 70.7 Heparin Dosing Weight: 71 kg  Vital Signs: Temp: 97.8 F (36.6 C) (04/22 1752) Temp Source: Oral (04/22 1752) BP: 124/82 mmHg (04/22 1752) Pulse Rate: 99 (04/22 1752)  Labs:  Recent Labs  02/24/15 1610 02/24/15 1911 02/24/15 2313 02/25/15 0420 02/25/15 1127 02/25/15 1806  HGB 15.0 12.9*  --   --   --   --   HCT 44.0 39.6  --   --   --   --   PLT  --  175  --   --   --   --   HEPARINUNFRC  --   --   --   --   --  0.30  CREATININE 1.00 0.99  --   --   --   --   TROPONINI  --   --  0.03 0.03 0.03  --     Estimated Creatinine Clearance: 68.4 mL/min (by C-G formula based on Cr of 0.99).  Assessment: 74 yo man to start heparin therapy for USAP.  He was not on anticoagulants pta.  Baseline Hg 12.9, PTLC 175  First heparin level just in therapeutic range at 0.3 units/hr on 1250 units/hr.  Goal of Therapy:  Heparin level 0.3-0.7 units/ml Monitor platelets by anticoagulation protocol: Yes   Plan:  -increase heparin drip to 1350 units/hr to ensure level stays in therapeutic range -daily HL and CBC -follow for s/s bleeding  Aoki Wedemeyer D. Jacody Beneke, PharmD, BCPS Clinical Pharmacist Pager: [phone] 02/25/2015 7:27 PM

## 2015-02-25 NOTE — Progress Notes (Signed)
Pt c/o "tingling" on left face and left arm.  However, no pain.  Dr. Malachi BondsShort was present.  Gave Nitro 0.4mg / SL to relieve "tingling."  No other symptoms.  "Tingling" relieved.  Unconfirmed EKG resulted SR.  VSS.

## 2015-02-25 NOTE — Progress Notes (Addendum)
INITIAL NUTRITION ASSESSMENT  DOCUMENTATION CODES Per approved criteria  -Severe malnutrition in the context of chronic illness   INTERVENTION: Glucerna Shake po BID, each supplement provides 220 kcal and 10 grams of protein RD to follow for nutrition care plan  NUTRITION DIAGNOSIS: Increased nutrient needs related to wound healing as evidenced by estimated nutrition needs  Goal: Pt to meet >/= 90% of their estimated nutrition needs   Monitor:  PO & supplemental intake, weight, labs, I/O's  Reason for Assessment: wound  74 y.o. male  Admitting Dx: SOB  ASSESSMENT: 74 y.o. Male with PMH significant for chronic CHF, HTN and diabetes mellitus type 2 presented with increased shortness of breath and swelling in the lower extremities. Patient presented to urgent care center originally for left pain and he was found to be in failure and transferred to [facility] for further management.  Patient reports his appetite is good.  PO intake 100% per flowsheet records.  See per Clinical Nutrition during previous admission.  Malnutrition identified and ongoing.  CWOCN note reviewed.  Pt with chronic wounds to ankle/toe/foot areas.  Nutrient needs increased given wound presence.  Pt would benefit from oral nutrition supplements.  RD to order.  Nutrition Focused Physical Exam:  Subcutaneous Fat:  Orbital Region: WNL Upper Arm Region: severe depletion Thoracic and Lumbar Region: severe depletion  Muscle:  Temple Region: mild/moderate depletion Clavicle Bone Region: severe depletion Clavicle and Acromion Bone Region: severe depletion Scapular Bone Region: N/A Dorsal Hand: N/A Patellar Region: moderate-severe depletion Anterior Thigh Region: moderate-severe depletion Posterior Calf Region: moderate-severe depletion  Edema:   Patient meets criteria for severe malnutrition in the context of chronic illness as evidenced by severe subcutaneous fat & muscle loss.  Height: Ht Readings from Last 1  Encounters:  02/24/15  (1.753 m)    Weight: Wt Readings from Last 1 Encounters:  02/25/15 157 lb 9.6 oz (71.487 kg)    Ideal Body Weight: 160 lb  % Ideal Body Weight: 98%  Wt Readings from Last 10 Encounters:  02/25/15 157 lb 9.6 oz (71.487 kg)  02/10/15 149 lb 6.4 oz (67.767 kg)  12/15/14 152 lb 9.6 oz (69.219 kg)  12/14/14 142 lb 3.2 oz (64.501 kg)  11/28/14 150 lb (68.04 kg)  11/22/14 149 lb 14.6 oz (68 kg)  10/27/14 151 lb 12.8 oz (68.856 kg)  09/01/14 143 lb (64.864 kg)  08/14/14 143 lb 0.9 oz (64.89 kg)    Usual Body Weight: 152 lb  % Usual Body Weight: 103%  BMI:  Body mass index is 23.26 kg/(m^2).  Estimated Nutritional Needs: Kcal: 2000-2200 Protein: 115-125 gm Fluid: 2.0-2.2 L  Skin:  Left outer foot with full thickness wound Left inner ankle with dry callous surrounding partial thickness wound Left small outer toe with healing full thickness wound Right inner ankle with dry callous surrounding partail thickness wound  Diet Order: Diet Carb Modified Fluid consistency:: Thin; Room service appropriate?: Yes; Fluid restriction:: 1800 mL Fluid  EDUCATION NEEDS: -Education needs addressed   Intake/Output Summary (Last 24 hours) at 02/25/15 1504 Last data filed at 02/25/15 1326  Gross per 24 hour  Intake 1374.5 ml  Output   1980 ml  Net -605.5 ml    Labs:   Recent Labs Lab 02/24/15 1610 02/24/15 1911  NA 138 135  K 3.9 3.6  CL 99 100  CO2  --  28  BUN 12 11  CREATININE 1.00 0.99  CALCIUM  --  8.7  GLUCOSE 184* 160*  CBG (last 3)   Recent Labs  02/24/15 2239 02/25/15 0617 02/25/15 1116  GLUCAP 165* 367* 308*    Scheduled Meds: . albuterol  2.5 mg Nebulization Q6H  . aspirin  81 mg Oral Daily  . atorvastatin  80 mg Oral q1800  . carvedilol  6.25 mg Oral BID WC  . fluticasone  2 spray Each Nare Daily  . furosemide  40 mg Intravenous BID  . gabapentin  300 mg Oral TID  . guaiFENesin  600 mg Oral BID  . [START ON  02/26/2015] hydrocerin   Topical Daily  . insulin aspart  0-15 Units Subcutaneous TID WC  . insulin aspart  0-5 Units Subcutaneous QHS  . loratadine  10 mg Oral Daily  . losartan  25 mg Oral Daily  . mometasone-formoterol  2 puff Inhalation BID  . nicotine  21 mg Transdermal Daily  . pantoprazole  40 mg Oral Daily  . potassium chloride SA  40 mEq Oral BID  . sodium chloride  3 mL Intravenous Q12H  . sodium chloride  3 mL Intravenous Q12H  . zolpidem  5 mg Oral QHS    Continuous Infusions: . heparin 1,250 Units/hr (02/25/15 1422)    Past Medical History  Diagnosis Date  . Hypertension   . Chronic systolic CHF (congestive heart failure)     a. 2D ECHO 08/13/14: EF 40%, diffuse hypokinesis possibly worse in the inferior wall. Mild MR, mild LA dilation, PA pressure 50. Trivial pericardial effusion.  . Acute systolic CHF (congestive heart failure), NYHA class 4 08/12/2014  . Type II diabetes mellitus   . GERD (gastroesophageal reflux disease)   . Peripheral neuropathy     [name]/notes 08/12/2014  . Stroke 2010    "memory problems since; right leg and left arm weakness since" (02/08/2015)  . Ulcers of both lower extremities     [name]/notes 02/24/2015  . Noncompliance     [name]/notes 02/24/2015    Past Surgical History  Procedure Laterality Date  . Carotid endarterectomy Right 2010  . Hand ligament reconstruction Right ?1970's    "pinky"    Maureen ChattersKatie Morene Cecilio, RD, LDN Pager #: [phone] After-Hours Pager #: [phone]

## 2015-02-25 NOTE — Plan of Care (Signed)
Problem: Limited Adherence to Nutrition-Related Recommendations (NB-1.6) Goal: Nutrition education Formal process to instruct or train a patient/client in a skill or to impart knowledge to help patients/clients voluntarily manage or modify food choices and eating behavior to maintain or improve health. Outcome: Completed/Met Date Met:  02/25/15  RD consulted for nutrition education regarding diabetes.     Lab Results  Component Value Date    HGBA1C 7.5* 11/20/2014    RD provided "Carbohydrate Counting for People with Diabetes" handout from the Academy of Nutrition and Dietetics. Discussed different food groups and their effects on blood sugar, emphasizing carbohydrate-containing foods. Provided list of carbohydrates and recommended serving sizes of common foods.  Discussed importance of controlled and consistent carbohydrate intake throughout the day. Provided examples of ways to balance meals/snacks and encouraged intake of high-fiber, whole grain complex carbohydrates. Teach back method used.  Expect fair compliance.  Body mass index is 23.26 kg/(m^2). Pt meets criteria for Normal based on current BMI.  Current diet order is Carbohydrate Modified, patient is consuming approximately 100% of meals at this time. Labs and medications reviewed. No further nutrition interventions warranted at this time.. If additional nutrition issues arise, please re-consult RD.  Arthur Holms, RD, LDN Pager #: [phone] After-Hours Pager #: [phone]

## 2015-02-25 NOTE — Progress Notes (Addendum)
[facility] PROGRESS NOTE  Russell Hamilton ZOX:[id number] DOB: Mar 01, 1941 DOA: 02/24/2015 PCP: No PCP Per Patient  Brief Summary  74 year old male with history of chronic systolic and diastolic heart failure, hypertension, diabetes type mellitus type II, cad based on cat scan imaging because patient previously declined cardiac catheterization, limited to no medical insight, medication compliance, lack of insurance. The patient came to the urgent care center because of tingling and pain in his left arm, shoulder, and back, shortness of breath, and pain in his feet. He was found to have heart failure exacerbation and was admitted for further evaluation. Since admission he has had repeated episodes of tingling in his left arm and back while at rest that have improved with nitroglycerin. This is concerning for unstable angina. He is diuresing. He is undergoing education about heart failure.  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 -  Weight stable and diuresed minimally since yesterday but is on received 1 dose of 40 mg IV Lasix -  Continue Lasix 40 mg IV twice a day -  Daily weights and strict ins and outs -  Patient is on beta blocker, ARB, Lasix -  He should also be on spironolactone -  BNP 1700  CAD with intermittent tingling of his left arm and shoulder which improves with nitroglycerin which is concerning for possible unstable angina -  Continuing to try to talk to patient about possibility of heart catheterization, open to possibility -  Cardiology consulted to assist continue beta blocker, aspirin 81 mg -  Start high-dose statin -  Changed to therapeutic heparin   COPD with recent exacerbation and just completed steroid taper -  D/c solumedrol -  Continue albuterol nebs -  Start dulera  Bilateral lower extremity ulcers  -  Wound care  consultation  -  Continue diuresis   Hypertension, blood pressure mildly elevated -  Continue metop  Diabetes mellitus type 2  -  Discontinue sulfonylurea  -Continue sliding scale insulin  -  Adjust insulin as needed based on CBGs  -  Repeat hemoglobin A1c  Normocytic anemia, hemoglobin at baseline of 12-13 -  Repeat hemoglobin in a.m. -  Check iron studies, B12, folate, TSH  Noncompliance with appointments and lack of insurance, very limited and poor insight into medical conditions. Patient cannot tell me why he was in the hospital only that he felt Sascha Baugher of breath and that his legs hurt. He seemed to have no idea about heart failure even though he has had multiple hospitalizations for this diagnosis in the last several months. He stated that he had tried to apply for Medicaid and had waited for 8 hours but was ultimately sent a letter saying he did not qualify in the state of [place]. When asked what he did to obtain insurance since he did not qualify for Medicaid he said that he did not do anything because he did not qualify for Medicaid. He stated that he did not keep his appointment with community health and wellness because your scheduled for 8 AM and he stays up all night and then sleeps during the day and so could not make it to those appointments. He wanted their plans to be scheduled for the afternoon. The last point that he had scheduled for [facility] wellness was in the afternoon but he also failed to show up for that appointment. He seemed to be unaware  that he go to community [facility] wellness even without insurance even though the case manager had talked to him about this repeatedly. -  Social work and case Engineering geologistmanagement consultations -  Education with teach back about insurance, medications, and health problems -  Nurse to keep him awake during the day as much as possible, lites on, TV on, then asleep with lights off and TV off at night.  Smoking cessation -   Smoking cessation counseling  Diet:  Diabetic Access:  PIV IVF:  off Proph:  lovenox  Code Status: full Family Communication: patient alone Disposition Plan: pending diuresis   Consultants:  Cardiology  Procedures:  CXR  Antibiotics:  none   HPI/Subjective:  Patient declining medications.   patient had episode of left arm and shoulder and back tingling that resolved nitroglycerin. He had worsening shortness of breath during the episode. His EKG appears unchanged with lateral ST segment depressions and T-wave inversions   Objective: Filed Vitals:   02/25/15 0126 02/25/15 0632 02/25/15 0744 02/25/15 0900  BP: 114/74 145/91  149/88  Pulse: 93 96  98  Temp: 98 F (36.7 C) 97.9 F (36.6 C)    TempSrc: Oral Oral    Resp: 18 18    Height:      Weight:  71.487 kg (157 lb 9.6 oz)    SpO2: 94% 96% 94%     Intake/Output Summary (Last 24 hours) at 02/25/15 1045 Last data filed at 02/25/15 1024  Gross per 24 hour  Intake  997.5 ml  Output    580 ml  Net  417.5 ml   Filed Weights   02/24/15 1836 02/24/15 2236 02/25/15 0632  Weight: 72.179 kg (159 lb 2 oz) 71.351 kg (157 lb 4.8 oz) 71.487 kg (157 lb 9.6 oz)    Exam:   General:   average weight male, No acute distress  HEENT:  NCAT, MMM  Cardiovascular:  RRR, nl S1, S2 no mrg, 2+ pulses, warm extremities  Respiratory:  diminished bilateral breath sounds at the bases with some faint ralesreased WOB  Abdomen:   NABS, soft, NT/ND  MSK:   Normal tone and bulk,  1+ pitting bilateral lower extreme edema  Neuro:  Grossly intact  Data Reviewed: Basic Metabolic Panel:  Recent Labs Lab 02/24/15 1610 02/24/15 1911  NA 138 135  K 3.9 3.6  CL 99 100  CO2  --  28  GLUCOSE 184* 160*  BUN 12 11  CREATININE 1.00 0.99  CALCIUM  --  8.7   Liver Function Tests: No results for input(s): AST, ALT, ALKPHOS, BILITOT, PROT, ALBUMIN in the last 168 hours. No results for input(s): LIPASE, AMYLASE in the last 168  hours. No results for input(s): AMMONIA in the last 168 hours. CBC:  Recent Labs Lab 02/24/15 1610 02/24/15 1911  WBC  --  4.5  NEUTROABS  --  2.9  HGB 15.0 12.9*  HCT 44.0 39.6  MCV  --  89.2  PLT  --  175   Cardiac Enzymes:  Recent Labs Lab 02/24/15 2313 02/25/15 0420  TROPONINI 0.03 0.03   BNP (last 3 results)  Recent Labs  12/11/14 1945 02/08/15 1502 02/24/15 1911  BNP 1884.5* 1355.4* 1739.5*    ProBNP (last 3 results)  Recent Labs  08/12/14 1756  PROBNP 7427.0*    CBG:  Recent Labs Lab 02/24/15 2239 02/25/15 0617  GLUCAP 165* 367*    No results found for this or any previous visit (from the past 240  hour(s)).   Studies: Dg Chest 2 View  02/24/2015   CLINICAL DATA:  Shortness of breath, coughing.  EXAM: CHEST  2 VIEW  COMPARISON:  February 08, 2015.  FINDINGS: Stable cardiomediastinal silhouette. No pneumothorax is noted. Stable moderate size bilateral pleural effusions are noted compared to prior exam mild interstitial densities are noted throughout both lungs suggesting pulmonary edema. Degenerative change of both acromioclavicular joints is noted.  IMPRESSION: Stable cardiomegaly with associated bilateral pulmonary edema and pleural effusions concerning for congestive heart failure.   Electronically Signed   By: Lupita Raider, M.D.   On: 02/24/2015 16:46   Dg Foot Complete Left  02/24/2015   CLINICAL DATA:  Patient hospitalized 2 weeks ago for foot infection. History of diabetes. Now with sores involving the left foot. No known injury.  EXAM: LEFT FOOT - COMPLETE 3+ VIEW  COMPARISON:  11/28/2014  FINDINGS: There is apparent soft tissue defect involving the lateral mid aspect of the foot. This finding is without associated radiopaque foreign body, subcutaneous emphysema or discrete area of osteolysis to suggest osteomyelitis.  No fracture or dislocation. Joint spaces are preserved. No erosions. Minimal enthesopathic change of the Achilles tendon insertion  site. Distal vascular calcifications compatible with provided history of diabetes.  IMPRESSION: Soft tissue defect involving the lateral mid aspect of the foot without radiopaque foreign body or radiographic evidence of osteomyelitis.   Electronically Signed   By: Simonne Come M.D.   On: 02/24/2015 16:45    Scheduled Meds: . albuterol  2.5 mg Nebulization Q6H  . aspirin  81 mg Oral Daily  . carvedilol  6.25 mg Oral BID WC  . enoxaparin (LOVENOX) injection  40 mg Subcutaneous Q24H  . fluticasone  2 spray Each Nare Daily  . furosemide  40 mg Intravenous BID  . gabapentin  300 mg Oral TID  . glimepiride  2 mg Oral Q breakfast  . guaiFENesin  600 mg Oral BID  . insulin aspart  0-5 Units Subcutaneous QHS  . insulin aspart  0-9 Units Subcutaneous TID WC  . loratadine  10 mg Oral Daily  . losartan  25 mg Oral Daily  . methylPREDNISolone (SOLU-MEDROL) injection  60 mg Intravenous Q8H  . nicotine  21 mg Transdermal Daily  . pantoprazole  40 mg Oral Daily  . potassium chloride SA  40 mEq Oral BID  . sodium chloride  3 mL Intravenous Q12H  . sodium chloride  3 mL Intravenous Q12H  . zolpidem  5 mg Oral QHS   Continuous Infusions:   Active Problems:   Congestive heart failure    Time spent: 30 min    Lorella Gomez, [facility]  [facility] Pager [phone]. If 7PM-7AM, please contact night-coverage at [email or web address], password [facility] 02/25/2015, 10:45 AM

## 2015-02-26 DIAGNOSIS — J9601 Acute respiratory failure with hypoxia: Secondary | ICD-10-CM

## 2015-02-26 DIAGNOSIS — I5043 Acute on chronic combined systolic (congestive) and diastolic (congestive) heart failure: Principal | ICD-10-CM

## 2015-02-26 LAB — BASIC METABOLIC PANEL
ANION GAP: 10 (ref 5–15)
BUN: 21 mg/dL (ref 6–23)
CO2: 28 mmol/L (ref 19–32)
Calcium: 8.5 mg/dL (ref 8.4–10.5)
Chloride: 99 mmol/L (ref 96–112)
Creatinine, Ser: 1.28 mg/dL (ref 0.50–1.35)
GFR calc Af Amer: 63 mL/min — ABNORMAL LOW (ref >90)
GFR calc non Af Amer: 55 mL/min — ABNORMAL LOW (ref >90)
Glucose, Bld: 131 mg/dL — ABNORMAL HIGH (ref 70–99)
POTASSIUM: 3.6 mmol/L (ref 3.5–5.1)
SODIUM: 137 mmol/L (ref 135–145)

## 2015-02-26 LAB — CBC
HCT: 33.5 % — ABNORMAL LOW (ref 39.0–52.0)
Hemoglobin: 11.1 g/dL — ABNORMAL LOW (ref 13.0–17.0)
MCH: 29.2 pg (ref 26.0–34.0)
MCHC: 33.1 g/dL (ref 30.0–36.0)
MCV: 88.2 fL (ref 78.0–100.0)
PLATELETS: 164 10*3/uL (ref 150–400)
RBC: 3.8 MIL/uL — ABNORMAL LOW (ref 4.22–5.81)
RDW: 15.4 % (ref 11.5–15.5)
WBC: 8.6 10*3/uL (ref 4.0–10.5)

## 2015-02-26 LAB — GLUCOSE, CAPILLARY
Glucose-Capillary: 130 mg/dL — ABNORMAL HIGH (ref 70–99)
Glucose-Capillary: 184 mg/dL — ABNORMAL HIGH (ref 70–99)
Glucose-Capillary: 89 mg/dL (ref 70–99)
Glucose-Capillary: 232 mg/dL — ABNORMAL HIGH (ref 70–99)

## 2015-02-26 LAB — IRON AND TIBC
Iron: 21 ug/dL — ABNORMAL LOW (ref 42–165)
Saturation Ratios: 7 % — ABNORMAL LOW (ref 20–55)
TIBC: 291 ug/dL (ref 215–435)
UIBC: 270 ug/dL (ref 125–400)

## 2015-02-26 LAB — TSH: TSH: 1.162 u[IU]/mL (ref 0.350–4.500)

## 2015-02-26 LAB — HEPARIN LEVEL (UNFRACTIONATED): HEPARIN UNFRACTIONATED: 0.43 [IU]/mL (ref 0.30–0.70)

## 2015-02-26 MED ORDER — IPRATROPIUM-ALBUTEROL 0.5-2.5 (3) MG/3ML IN SOLN
3.0000 mL | Freq: Three times a day (TID) | RESPIRATORY_TRACT | Status: DC
  Administered 2015-02-26 – 2015-03-01 (×8): 3 mL via RESPIRATORY_TRACT
  Filled 2015-02-26 (×10): qty 3

## 2015-02-26 MED ORDER — PREDNISONE 50 MG PO TABS
50.0000 mg | ORAL_TABLET | Freq: Every day | ORAL | Status: DC
  Administered 2015-02-26: 50 mg via ORAL
  Filled 2015-02-26 (×2): qty 1

## 2015-02-26 MED ORDER — CARVEDILOL 6.25 MG PO TABS
6.2500 mg | ORAL_TABLET | Freq: Two times a day (BID) | ORAL | Status: DC
Start: 2015-02-27 — End: 2015-03-01
  Administered 2015-02-27 – 2015-03-01 (×5): 6.25 mg via ORAL
  Filled 2015-02-26 (×7): qty 1

## 2015-02-26 MED ORDER — FUROSEMIDE 40 MG PO TABS
40.0000 mg | ORAL_TABLET | Freq: Every day | ORAL | Status: DC
Start: 2015-02-27 — End: 2015-02-26

## 2015-02-26 MED ORDER — ISOSORBIDE MONONITRATE ER 30 MG PO TB24
30.0000 mg | ORAL_TABLET | Freq: Every day | ORAL | Status: DC
  Administered 2015-02-26 – 2015-02-27 (×2): 30 mg via ORAL
  Filled 2015-02-26 (×2): qty 1

## 2015-02-26 MED ORDER — FUROSEMIDE 10 MG/ML IJ SOLN
40.0000 mg | Freq: Two times a day (BID) | INTRAMUSCULAR | Status: DC
  Administered 2015-02-26 – 2015-02-27 (×2): 40 mg via INTRAVENOUS
  Filled 2015-02-26 (×2): qty 4

## 2015-02-26 MED ORDER — PREDNISONE 50 MG PO TABS
50.0000 mg | ORAL_TABLET | Freq: Every day | ORAL | Status: DC
  Administered 2015-02-27 – 2015-03-01 (×3): 50 mg via ORAL
  Filled 2015-02-26 (×4): qty 1

## 2015-02-26 NOTE — Progress Notes (Signed)
ANTICOAGULATION CONSULT NOTE - FOLLOW UP  Pharmacy Consult:  Heparin Indication: chest pain/ACS  No Known Allergies  Patient Measurements: Height: 5\' 9"  (175.3 cm) Weight: 157 lb 4.5 oz (71.342 kg) (Scale C) IBW/kg (Calculated) : 70.7 Heparin Dosing Weight: 71 kg  Vital Signs: Temp: 97.2 F (36.2 C) (04/23 0534) Temp Source: Oral (04/23 0534) BP: 109/76 mmHg (04/23 0534) Pulse Rate: 84 (04/23 0534)  Labs:  Recent Labs  02/24/15 1610 02/24/15 1911 02/24/15 2313 02/25/15 0420 02/25/15 1127 02/25/15 1806 02/26/15 0550  HGB 15.0 12.9*  --   --   --   --  11.1*  HCT 44.0 39.6  --   --   --   --  33.5*  PLT  --  175  --   --   --   --  164  HEPARINUNFRC  --   --   --   --   --  0.30 0.43  CREATININE 1.00 0.99  --   --   --   --  1.28  TROPONINI  --   --  0.03 0.03 0.03  --   --     Estimated Creatinine Clearance: 52.9 mL/min (by C-G formula based on Cr of 1.28).    Assessment: [age] YOM [place] to continue on IV heparin.  Heparin level is therapeutic, no bleeding reported.   Goal of Therapy:  Heparin level 0.3-0.7 units/ml Monitor platelets by anticoagulation protocol: Yes    Plan:  - Continue heparin gtt at 1350 units/hr - Daily HL / CBC    Kawanda Drumheller D. Laney Potashang, PharmD, BCPS Pager:  [phone] 02/26/2015, 8:45 AM

## 2015-02-26 NOTE — Progress Notes (Signed)
[facility] PROGRESS NOTE  Russell Hamilton ZOX:[id number] DOB: 09/23/1941 DOA: 02/24/2015 PCP: No PCP Per Patient  Brief Summary  74 year old male with history of chronic systolic and diastolic heart failure, hypertension, diabetes type mellitus type II, cad based on cat scan imaging because patient previously declined cardiac catheterization, limited to no medical insight, medication compliance, lack of insurance. The patient came to the urgent care center because of tingling and pain in his left arm, shoulder, and back, shortness of breath, and pain in his feet. He was found to have heart failure exacerbation and was admitted for further evaluation. Since admission he has had repeated episodes of tingling in his left arm and back while at rest that have improved with nitroglycerin. This is concerning for unstable angina. He is diuresing. He is undergoing education about heart failure.  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.  Creatinine rising slightly, but still has significant JVP -  Weight stable  -  -2L yesterday -  Continue Lasix 40 mg IV BID -  Patient is on beta blocker, ARB, Lasix -  He should also be on spironolactone, but holding off for now since starting imdur -  BNP 1700  CAD with intermittent tingling of his left arm and shoulder which improves with nitroglycerin which is concerning for possible unstable angina -  Patient declines heart catheterization -  Continue beta blocker, aspirin 81 mg -  Started high-dose statin -  D/c heparin  -  Start imdur 30mg  daily  COPD with recent exacerbation and just completed steroid taper -  D/c solumedrol -  D/c albuterol nebs  -  Start duonebs and prednisone -  Start dulera  Bilateral lower extremity callouses and ulcer left foot lateral aspect -  Wound care consultation  -  Continue  diuresis   Hypertension, blood pressure low normal -  Continue metop  Diabetes mellitus type 2, CBG well controlled -  Discontinued sulfonylurea  -  Continue sliding scale insulin  -  Adjust insulin as needed based on CBGs  -  A1c pending  Normocytic anemia, hemoglobin at baseline of 12-13 -  Repeat hemoglobin in a.m. -  Check iron studies, B12, folate pending -  TSH 1.162  Noncompliance with appointments and lack of insurance, very limited and poor insight into medical conditions. Patient cannot tell me why he was in the hospital only that he felt Monque Haggar of breath and that his legs hurt. He seemed to have no idea about heart failure even though he has had multiple hospitalizations for this diagnosis in the last several months. He stated that he had tried to apply for Medicaid and had waited for 8 hours but was ultimately sent a letter saying he did not qualify in the state of [place]. When asked what he did to obtain insurance since he did not qualify for Medicaid he said that he did not do anything because he did not qualify for Medicaid. He stated that he did not keep his appointment with community health and wellness because your scheduled for 8 AM and he stays up all night and then sleeps during the day and so could not make it to those appointments. He wanted their plans to be scheduled for the afternoon. The last point that he had scheduled for community health and wellness was in the afternoon but he also failed to show up for that appointment. He seemed  to be unaware that he go to community health and wellness even without insurance even though the case manager had talked to him about this repeatedly. -  Social work and case Engineering geologist -  Education with teach back about insurance, medications, and health problems -  Nurse to keep him awake during the day as much as possible, lites on, TV on, then asleep with lights off and TV off at night.  Smoking cessation -   Smoking cessation counseled  Diet:  Diabetic Access:  PIV IVF:  off Proph:  scd  Code Status: full Family Communication: patient alone Disposition Plan: pending diuresis, wean oxygen as tolerated   Consultants:  none  Procedures:  CXR  Antibiotics:  none   HPI/Subjective:  Patient declining heart cath.  States his breathing is a little better and his left foot pain has improved. He continues to have intermittent episodes of tingling in his left arm, shoulder, chest, and back.  Objective: Filed Vitals:   02/26/15 0204 02/26/15 0211 02/26/15 0534 02/26/15 0713  BP:  114/67 109/76   Pulse: 80 88 84   Temp:  97.6 F (36.4 C) 97.2 F (36.2 C)   TempSrc:  Oral Oral   Resp: Height:      Weight:   71.342 kg (157 lb 4.5 oz)   SpO2:  96% 98% 98%    Intake/Output Summary (Last 24 hours) at 02/26/15 1127 Last data filed at 02/26/15 1122  Gross per 24 hour  Intake 1843.16 ml  Output   3870 ml  Net -2026.84 ml   Filed Weights   02/24/15 2236 02/25/15 0632 02/26/15 0534  Weight: 71.351 kg (157 lb 4.8 oz) 71.487 kg (157 lb 9.6 oz) 71.342 kg (157 lb 4.5 oz)    Exam:   General:   average weight male, No acute distress  HEENT:  NCAT, MMM, + JVP  Cardiovascular:  RRR, nl S1, S2 no mrg, 2+ pulses, warm extremities  Respiratory:  diminished bilateral breath sounds at the bases with bilateral basilar rales, full expiroatyr wheeze, mild tachypnea  Abdomen:   NABS, soft, NT/ND  MSK:   Normal tone and bulk,  trace pitting bilateral lower extreme edema  Neuro:  Grossly intact  Skin:  Dry ulcer 4cm on lateral aspect of left foot and multiple callouses on both feet  Data Reviewed: Basic Metabolic Panel:  Recent Labs Lab 02/24/15 1610 02/24/15 1911 02/26/15 0550  NA 138 135 137  K 3.9 3.6 3.6  CL 99 100 99  CO2  --  28 28  GLUCOSE 184* 160* 131*  BUN CREATININE 1.00 0.99 1.28  CALCIUM  --  8.7 8.5   Liver Function Tests: No results  for input(s): AST, ALT, ALKPHOS, BILITOT, PROT, ALBUMIN in the last 168 hours. No results for input(s): LIPASE, AMYLASE in the last 168 hours. No results for input(s): AMMONIA in the last 168 hours. CBC:  Recent Labs Lab 02/24/15 1610 02/24/15 1911 02/26/15 0550  WBC  --  4.5 8.6  NEUTROABS  --  2.9  --   HGB 15.0 12.9* 11.1*  HCT 44.0 39.6 33.5*  MCV  --  89.2 88.2  PLT  --  175 164   Cardiac Enzymes:  Recent Labs Lab 02/24/15 2313 02/25/15 0420 02/25/15 1127  TROPONINI 0.03 0.03 0.03   BNP (last 3 results)  Recent Labs  12/11/14 1945 02/08/15 1502 02/24/15 1911  BNP 1884.5* 1355.4* 1739.5*  ProBNP (last 3 results)  Recent Labs  08/12/14 1756  PROBNP 7427.0*    CBG:  Recent Labs Lab 02/25/15 1116 02/25/15 1605 02/25/15 2131 02/26/15 0538 02/26/15 1121  GLUCAP 308* 209* 120* 130* 184*    No results found for this or any previous visit (from the past 240 hour(s)).   Studies: Dg Chest 2 View  02/24/2015   CLINICAL DATA:  Shortness of breath, coughing.  EXAM: CHEST  2 VIEW  COMPARISON:  February 08, 2015.  FINDINGS: Stable cardiomediastinal silhouette. No pneumothorax is noted. Stable moderate size bilateral pleural effusions are noted compared to prior exam mild interstitial densities are noted throughout both lungs suggesting pulmonary edema. Degenerative change of both acromioclavicular joints is noted.  IMPRESSION: Stable cardiomegaly with associated bilateral pulmonary edema and pleural effusions concerning for congestive heart failure.   Electronically Signed   By: Lupita Raider, M.D.   On: 02/24/2015 16:46   Dg Foot Complete Left  02/24/2015   CLINICAL DATA:  Patient hospitalized 2 weeks ago for foot infection. History of diabetes. Now with sores involving the left foot. No known injury.  EXAM: LEFT FOOT - COMPLETE 3+ VIEW  COMPARISON:  11/28/2014  FINDINGS: There is apparent soft tissue defect involving the lateral mid aspect of the foot. This  finding is without associated radiopaque foreign body, subcutaneous emphysema or discrete area of osteolysis to suggest osteomyelitis.  No fracture or dislocation. Joint spaces are preserved. No erosions. Minimal enthesopathic change of the Achilles tendon insertion site. Distal vascular calcifications compatible with provided history of diabetes.  IMPRESSION: Soft tissue defect involving the lateral mid aspect of the foot without radiopaque foreign body or radiographic evidence of osteomyelitis.   Electronically Signed   By: Simonne Come M.D.   On: 02/24/2015 16:45    Scheduled Meds: . aspirin  81 mg Oral Daily  . atorvastatin  80 mg Oral q1800  . carvedilol  6.25 mg Oral BID WC  . feeding supplement (GLUCERNA SHAKE)  237 mL Oral BID BM  . fluticasone  2 spray Each Nare Daily  . [START ON 02/27/2015] furosemide  40 mg Oral Daily  . gabapentin  300 mg Oral TID  . guaiFENesin  600 mg Oral BID  . hydrocerin   Topical Daily  . insulin aspart  0-15 Units Subcutaneous TID WC  . insulin aspart  0-5 Units Subcutaneous QHS  . ipratropium-albuterol  3 mL Nebulization TID  . isosorbide mononitrate  30 mg Oral Daily  . loratadine  10 mg Oral Daily  . losartan  25 mg Oral Daily  . mometasone-formoterol  2 puff Inhalation BID  . nicotine  21 mg Transdermal Daily  . pantoprazole  40 mg Oral Daily  . potassium chloride SA  40 mEq Oral BID  . predniSONE  50 mg Oral Q breakfast  . sodium chloride  3 mL Intravenous Q12H  . sodium chloride  3 mL Intravenous Q12H  . zolpidem  5 mg Oral QHS   Continuous Infusions:   Active Problems:   HTN (hypertension)   Type II diabetes mellitus   Acute on chronic combined systolic and diastolic heart failure   Foot ulcer   Acute respiratory failure with hypoxia   Tobacco abuse   Congestive heart failure   Unstable angina    Time spent: 30 min    Algenis Ballin, [facility]  [facility] Pager [phone]. If 7PM-7AM, please contact night-coverage at  [email or web address], password [facility] 02/26/2015, 11:27 AM  LOS: 1 day

## 2015-02-27 ENCOUNTER — Inpatient Hospital Stay (HOSPITAL_COMMUNITY): Payer: Medicare (Managed Care)

## 2015-02-27 DIAGNOSIS — I509 Heart failure, unspecified: Secondary | ICD-10-CM

## 2015-02-27 DIAGNOSIS — R079 Chest pain, unspecified: Secondary | ICD-10-CM

## 2015-02-27 LAB — GLUCOSE, CAPILLARY
GLUCOSE-CAPILLARY: 210 mg/dL — AB (ref 70–99)
GLUCOSE-CAPILLARY: 314 mg/dL — AB (ref 70–99)
Glucose-Capillary: 223 mg/dL — ABNORMAL HIGH (ref 70–99)
Glucose-Capillary: 202 mg/dL — ABNORMAL HIGH (ref 70–99)

## 2015-02-27 LAB — CBC
HCT: 34.1 % — ABNORMAL LOW (ref 39.0–52.0)
HEMOGLOBIN: 11.2 g/dL — AB (ref 13.0–17.0)
MCH: 29.3 pg (ref 26.0–34.0)
MCHC: 32.8 g/dL (ref 30.0–36.0)
MCV: 89.3 fL (ref 78.0–100.0)
Platelets: 165 10*3/uL (ref 150–400)
RBC: 3.82 MIL/uL — ABNORMAL LOW (ref 4.22–5.81)
RDW: 15.6 % — ABNORMAL HIGH (ref 11.5–15.5)
WBC: 6.7 10*3/uL (ref 4.0–10.5)

## 2015-02-27 LAB — FERRITIN: Ferritin: 32 ng/mL (ref 22–322)

## 2015-02-27 LAB — BASIC METABOLIC PANEL
ANION GAP: 10 (ref 5–15)
BUN: 26 mg/dL — ABNORMAL HIGH (ref 6–23)
CHLORIDE: 95 mmol/L — AB (ref 96–112)
CO2: 28 mmol/L (ref 19–32)
Calcium: 8.4 mg/dL (ref 8.4–10.5)
Creatinine, Ser: 1.36 mg/dL — ABNORMAL HIGH (ref 0.50–1.35)
GFR calc Af Amer: 59 mL/min — ABNORMAL LOW (ref >90)
GFR, EST NON AFRICAN AMERICAN: 51 mL/min — AB (ref >90)
Glucose, Bld: 221 mg/dL — ABNORMAL HIGH (ref 70–99)
Potassium: 3.4 mmol/L — ABNORMAL LOW (ref 3.5–5.1)
SODIUM: 133 mmol/L — AB (ref 135–145)

## 2015-02-27 LAB — VITAMIN B12: Vitamin B-12: 773 pg/mL (ref 211–911)

## 2015-02-27 LAB — TRANSFERRIN: Transferrin: 240 mg/dL (ref 200–370)

## 2015-02-27 LAB — FOLATE: FOLATE: 8.3 ng/mL

## 2015-02-27 MED ORDER — HYDRALAZINE HCL 10 MG PO TABS
10.0000 mg | ORAL_TABLET | Freq: Three times a day (TID) | ORAL | Status: DC
  Administered 2015-02-27 – 2015-03-01 (×5): 10 mg via ORAL
  Filled 2015-02-27 (×8): qty 1

## 2015-02-27 MED ORDER — FERROUS SULFATE 325 (65 FE) MG PO TABS
325.0000 mg | ORAL_TABLET | Freq: Two times a day (BID) | ORAL | Status: DC
  Administered 2015-02-28 – 2015-03-01 (×3): 325 mg via ORAL
  Filled 2015-02-27 (×5): qty 1

## 2015-02-27 MED ORDER — FERROUS SULFATE 325 (65 FE) MG PO TABS
325.0000 mg | ORAL_TABLET | Freq: Two times a day (BID) | ORAL | Status: DC
  Administered 2015-02-27: 325 mg via ORAL
  Filled 2015-02-27 (×2): qty 1

## 2015-02-27 MED ORDER — SPIRONOLACTONE 25 MG PO TABS
25.0000 mg | ORAL_TABLET | Freq: Every day | ORAL | Status: DC
  Administered 2015-02-27: 25 mg via ORAL
  Filled 2015-02-27: qty 1

## 2015-02-27 MED ORDER — POTASSIUM CHLORIDE 10 MEQ/100ML IV SOLN
10.0000 meq | INTRAVENOUS | Status: AC
  Administered 2015-02-27 (×4): 10 meq via INTRAVENOUS
  Filled 2015-02-27 (×4): qty 100

## 2015-02-27 MED ORDER — ISOSORBIDE MONONITRATE ER 60 MG PO TB24
60.0000 mg | ORAL_TABLET | Freq: Every day | ORAL | Status: DC
  Administered 2015-02-28 – 2015-03-01 (×2): 60 mg via ORAL
  Filled 2015-02-27 (×2): qty 1

## 2015-02-27 MED ORDER — FUROSEMIDE 10 MG/ML IJ SOLN
80.0000 mg | Freq: Two times a day (BID) | INTRAMUSCULAR | Status: DC
  Administered 2015-02-27 – 2015-03-01 (×4): 80 mg via INTRAVENOUS
  Filled 2015-02-27 (×6): qty 8

## 2015-02-27 MED ORDER — FUROSEMIDE 40 MG PO TABS: 40.0000 mg | ORAL_TABLET | Freq: Every day | ORAL | Status: DC

## 2015-02-27 NOTE — Progress Notes (Addendum)
[facility] PROGRESS NOTE  Russell Hamilton EGB:[id number] DOB: 09/06/1941 DOA: 02/24/2015 PCP: No primary care provider on file.  Brief Summary  74 year old male with history of chronic systolic and diastolic heart failure, hypertension, diabetes type mellitus type II, cad based on cat scan imaging because patient previously declined cardiac catheterization, limited to no medical insight, medication compliance, lack of insurance. The patient came to the urgent care center because of tingling and pain in his left arm, shoulder, and back, shortness of breath, and pain in his feet. He was found to have heart failure exacerbation and was admitted for further evaluation. Since admission he has had repeated episodes of tingling in his left arm and back while at rest that have improved with nitroglycerin. This is concerning for unstable angina. He is diuresing. He is undergoing education about heart failure.  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.  Creatinine rising slightly. -  Weight down 2kg -  -450L yesterday -  Increase Lasix to 80 mg IV BID -  Patient is on beta blocker, Lasix -  Start hydralazine -  Increase imdur -  BNP 1700 -  Echo:  Moderately to severely reduced systolic function with mild LVH, ejection fraction of 30-35% with diffuse hypokinesis. Grade 1 diastolic dysfunction with evidence of high ventricular filling pressure. Moderate dilation of the left atrium, moderate dilation of the right atrium, moderate TR, PA peak pressure of 43 mmHg and a trivial pericardial effusion. -  Repeat chest x-ray:  Persistent vascular congestion/pulmonary edema  AKI, no recent contrast  -  D/c spironolactone -  D/c ARB -  FENa and RUS -  Continue diuresis for now   CAD with intermittent tingling of his left arm and shoulder which improves with nitroglycerin  which is concerning for possible unstable angina -  Patient declines heart catheterization -  Continue beta blocker, aspirin 81 mg -  Started high-dose statin -  D/c heparin  -  Started imdur and titrating dose   COPD with recent exacerbation and just completed steroid taper -  Started solumedrol and transitioned to prednisone on 4/24 -  Continue duonebs and prednisone -  Continue dulera  Bilateral lower extremity callouses and ulcer left foot lateral aspect -  Appreciate Wound care consultation   Hypertension, blood pressure within normal limits -  Continue metop  Diabetes mellitus type 2, CBG mildly elevated secondary to steroids -  Holding sulfonylurea  -  Continue sliding scale insulin  -  Adjust insulin as needed based on CBGs  -  A1c pending  Normocytic anemia, hemoglobin at baseline of 12-13 -  Repeat hemoglobin in a.m. -  Iron studies consistent with iron deficiency, B12 773 , folate 8.3 -  Start ferrous sulfate -  Recommend follow-up with gastroenterology -  TSH 1.162  Noncompliance with appointments and lack of insurance, very limited and poor insight into medical conditions. Patient cannot tell me why he was in the hospital only that he felt Russell Hamilton of breath and that his legs hurt. He seemed to have no idea about heart failure even though he has had multiple hospitalizations for this diagnosis in the last several months. He stated that he had tried to apply for Medicaid and had waited for 8 hours but was ultimately sent a letter saying he did not qualify in the state of [place]. When asked what he did to obtain insurance since he  did not qualify for Medicaid he said that he did not do anything because he did not qualify for Medicaid. He stated that he did not keep his appointment with community health and wellness because your scheduled for 8 AM and he stays up all night and then sleeps during the day and so could not make it to those appointments. He wanted their  plans to be scheduled for the afternoon. The last point that he had scheduled for community health and wellness was in the afternoon but he also failed to show up for that appointment. He seemed to be unaware that he go to community health and wellness even without insurance even though the case manager had talked to him about this repeatedly. -  Social work and case Engineering geologist -  Education with teach back about insurance, medications, and health problems -  Nurse to keep him awake during the day as much as possible, lights on, TV on, then asleep with lights off and TV off at night.   Smoking cessation -  Smoking cessation counseled  Diet:  Diabetic Access:  PIV IVF:  off Proph:  scd  Code Status: full Family Communication: patient alone Disposition Plan: pending diuresis, wean oxygen as tolerated   Consultants:  none  Procedures:  CXR  ECHO  RUS  Antibiotics:  none   HPI/Subjective:  Patient declines heart cath.  States his breathing is a little better and his left foot pain has improved.  He has not had further episodes of tingling in his left arm and shoulder.    Objective: Filed Vitals:   02/26/15 1435 02/26/15 2007 02/26/15 2115 02/27/15 0603  BP: 140/63 119/69  142/86  Pulse: 88 96 81 88  Temp: 97 F (36.1 C) 98.1 F (36.7 C)  98 F (36.7 C)  TempSrc: Oral Oral  Axillary  Resp: Height:      Weight:    68.325 kg (150 lb 10.1 oz)  SpO2: 98% 93% 97% 98%    Intake/Output Summary (Last 24 hours) at 02/27/15 1157 Last data filed at 02/27/15 1000  Gross per 24 hour  Intake 1736.28 ml  Output   2325 ml  Net -588.72 ml   Filed Weights   02/25/15 0632 02/26/15 0534 02/27/15 0603  Weight: 71.487 kg (157 lb 9.6 oz) 71.342 kg (157 lb 4.5 oz) 68.325 kg (150 lb 10.1 oz)    Exam:   General:   average weight male, No acute distress  HEENT:  NCAT, MMM, + JVP  Cardiovascular:  RRR, nl S1, S2 no mrg, 2+ pulses, warm  extremities  Respiratory:  diminished bilateral breath sounds at the bases full expiratory wheeze  Abdomen:   NABS, soft, NT/ND  MSK:   Normal tone and bulk,  trace ankle edema  Neuro:  Grossly intact  Skin:  Dry ulcer 4cm on lateral aspect of left foot and multiple callouses on both feet  Data Reviewed: Basic Metabolic Panel:  Recent Labs Lab 02/24/15 1610 02/24/15 1911 02/26/15 0550 02/27/15 0700  NA 138 135 137 133*  K 3.9 3.6 3.6 3.4*  CL 99 100 99 95*  CO2  --  GLUCOSE 184* 160* 131* 221*  BUN 26*  CREATININE 1.00 0.99 1.28 1.36*  CALCIUM  --  8.7 8.5 8.4   Liver Function Tests: No results for input(s): AST, ALT, ALKPHOS, BILITOT, PROT, ALBUMIN in the last 168 hours. No results for input(s): LIPASE, AMYLASE  in the last 168 hours. No results for input(s): AMMONIA in the last 168 hours. CBC:  Recent Labs Lab 02/24/15 1610 02/24/15 1911 02/26/15 0550 02/27/15 0700  WBC  --  4.5 8.6 6.7  NEUTROABS  --  2.9  --   --   HGB 15.0 12.9* 11.1* 11.2*  HCT 44.0 39.6 33.5* 34.1*  MCV  --  89.2 88.2 89.3  PLT  --  175 164 165   Cardiac Enzymes:  Recent Labs Lab 02/24/15 2313 02/25/15 0420 02/25/15 1127  TROPONINI 0.03 0.03 0.03   BNP (last 3 results)  Recent Labs  12/11/14 1945 02/08/15 1502 02/24/15 1911  BNP 1884.5* 1355.4* 1739.5*    ProBNP (last 3 results)  Recent Labs  08/12/14 1756  PROBNP 7427.0*    CBG:  Recent Labs Lab 02/26/15 1121 02/26/15 1605 02/26/15 2105 02/27/15 0604 02/27/15 1109  GLUCAP 184* 89 232* 223* 210*    No results found for this or any previous visit (from the past 240 hour(s)).   Studies: No results found.  Scheduled Meds: . aspirin  81 mg Oral Daily  . atorvastatin  80 mg Oral q1800  . carvedilol  6.25 mg Oral BID WC  . feeding supplement (GLUCERNA SHAKE)  237 mL Oral BID BM  . fluticasone  2 spray Each Nare Daily  . [START ON 02/28/2015] furosemide  40 mg Oral Daily  . gabapentin   300 mg Oral TID  . guaiFENesin  600 mg Oral BID  . hydrocerin   Topical Daily  . insulin aspart  0-15 Units Subcutaneous TID WC  . insulin aspart  0-5 Units Subcutaneous QHS  . ipratropium-albuterol  3 mL Nebulization TID  . isosorbide mononitrate  30 mg Oral Daily  . loratadine  10 mg Oral Daily  . losartan  25 mg Oral Daily  . mometasone-formoterol  2 puff Inhalation BID  . nicotine  21 mg Transdermal Daily  . pantoprazole  40 mg Oral Daily  . potassium chloride SA  40 mEq Oral BID  . predniSONE  50 mg Oral Q breakfast  . sodium chloride  3 mL Intravenous Q12H  . zolpidem  5 mg Oral QHS   Continuous Infusions:   Active Problems:   HTN (hypertension)   Type II diabetes mellitus   Acute on chronic combined systolic and diastolic heart failure   Foot ulcer   Acute respiratory failure with hypoxia   Tobacco abuse   Congestive heart failure   Unstable angina    Time spent: 30 min    Russell Hamilton, [facility]  [facility] Pager [phone]. If 7PM-7AM, please contact night-coverage at [email or web address], password [email or web address] 02/27/2015, 11:57 AM  LOS: 2 days

## 2015-02-27 NOTE — Progress Notes (Signed)
Patient has had three loose stools since change of shift.  Patient placed on Enteric Isolation and C-Diff PCR will be sent with next BM. Will continue to monitor. Troy SineWalker, Sufyaan Palma M

## 2015-02-27 NOTE — Progress Notes (Signed)
  Echocardiogram 2D Echocardiogram has been performed.  Parminder Cupples L Caitlin Ainley 02/27/2015, 9:11 AM

## 2015-02-27 NOTE — Progress Notes (Signed)
Patient refused majority of qhs medications tonight (see MAR). Patient has c/o chronic sharp intermittent pain to both his lower extremities, but refused both his gabapentin and potassium.  Stated that the IV Morphine is the only thing he has been given that has helped with the pain to his legs.  PRN IV Morphine administered as ordered, upon re-assessment patient was asleep with no signs of distress. Will continue to monitor. Troy SineWalker, Shamari Lofquist M

## 2015-02-27 NOTE — Progress Notes (Signed)
Pt refused some of his am meds, Potassium being one of them with K+ of 3.4. Pt ambulated in hallway down to the nursing station without O2. Sat  Started at 93% and only dropped down to 91-92% after ambulation. Dr Malachi BondsShort aware

## 2015-02-28 ENCOUNTER — Inpatient Hospital Stay: Payer: Medicare (Managed Care) | Admitting: Family Medicine

## 2015-02-28 LAB — URINALYSIS, ROUTINE W REFLEX MICROSCOPIC
Bilirubin Urine: NEGATIVE
Glucose, UA: NEGATIVE mg/dL
Hgb urine dipstick: NEGATIVE
Ketones, ur: NEGATIVE mg/dL
LEUKOCYTES UA: NEGATIVE
NITRITE: NEGATIVE
Protein, ur: NEGATIVE mg/dL
Specific Gravity, Urine: 1.008 (ref 1.005–1.030)
UROBILINOGEN UA: 0.2 mg/dL (ref 0.0–1.0)
pH: 6 (ref 5.0–8.0)

## 2015-02-28 LAB — HEMOGLOBIN A1C
HEMOGLOBIN A1C: 9.3 % — AB (ref 4.8–5.6)
MEAN PLASMA GLUCOSE: 220 mg/dL

## 2015-02-28 LAB — GLUCOSE, CAPILLARY
GLUCOSE-CAPILLARY: 237 mg/dL — AB (ref 70–99)
GLUCOSE-CAPILLARY: 365 mg/dL — AB (ref 70–99)
Glucose-Capillary: 173 mg/dL — ABNORMAL HIGH (ref 70–99)
Glucose-Capillary: 275 mg/dL — ABNORMAL HIGH (ref 70–99)

## 2015-02-28 LAB — CLOSTRIDIUM DIFFICILE BY PCR: CDIFFPCR: NEGATIVE

## 2015-02-28 LAB — CBC
HCT: 35.2 % — ABNORMAL LOW (ref 39.0–52.0)
Hemoglobin: 11.6 g/dL — ABNORMAL LOW (ref 13.0–17.0)
MCH: 29.1 pg (ref 26.0–34.0)
MCHC: 33 g/dL (ref 30.0–36.0)
MCV: 88.4 fL (ref 78.0–100.0)
Platelets: 174 10*3/uL (ref 150–400)
RBC: 3.98 MIL/uL — ABNORMAL LOW (ref 4.22–5.81)
RDW: 15.4 % (ref 11.5–15.5)
WBC: 9.8 10*3/uL (ref 4.0–10.5)

## 2015-02-28 LAB — CREATININE, URINE, RANDOM: Creatinine, Urine: 20.91 mg/dL

## 2015-02-28 LAB — BASIC METABOLIC PANEL
Anion gap: 10 (ref 5–15)
BUN: 30 mg/dL — ABNORMAL HIGH (ref 6–23)
CO2: 29 mmol/L (ref 19–32)
Calcium: 8.4 mg/dL (ref 8.4–10.5)
Chloride: 92 mmol/L — ABNORMAL LOW (ref 96–112)
Creatinine, Ser: 1.35 mg/dL (ref 0.50–1.35)
GFR calc Af Amer: 59 mL/min — ABNORMAL LOW (ref >90)
GFR calc non Af Amer: 51 mL/min — ABNORMAL LOW (ref >90)
Glucose, Bld: 239 mg/dL — ABNORMAL HIGH (ref 70–99)
Potassium: 3.6 mmol/L (ref 3.5–5.1)
Sodium: 131 mmol/L — ABNORMAL LOW (ref 135–145)

## 2015-02-28 LAB — SODIUM, URINE, RANDOM: Sodium, Ur: 108 mmol/L

## 2015-02-28 MED ORDER — FERROUS SULFATE 325 (65 FE) MG PO TABS: 325.0000 mg | ORAL_TABLET | Freq: Two times a day (BID) | ORAL | Status: DC

## 2015-02-28 MED ORDER — INSULIN ASPART 100 UNIT/ML ~~LOC~~ SOLN
3.0000 [IU] | Freq: Three times a day (TID) | SUBCUTANEOUS | Status: DC
  Administered 2015-03-01 (×2): 3 [IU] via SUBCUTANEOUS

## 2015-02-28 MED ORDER — ATORVASTATIN CALCIUM 80 MG PO TABS
80.0000 mg | ORAL_TABLET | Freq: Every day | ORAL | Status: DC
Start: 2015-02-28 — End: 2015-06-13

## 2015-02-28 MED ORDER — ISOSORBIDE MONONITRATE ER 60 MG PO TB24: 60.0000 mg | ORAL_TABLET | Freq: Every day | ORAL | Status: DC

## 2015-02-28 MED ORDER — HYDRALAZINE HCL 10 MG PO TABS: 10.0000 mg | ORAL_TABLET | Freq: Three times a day (TID) | ORAL | Status: DC

## 2015-02-28 MED ORDER — MOMETASONE FURO-FORMOTEROL FUM 100-5 MCG/ACT IN AERO: 2.0000 | INHALATION_SPRAY | Freq: Two times a day (BID) | RESPIRATORY_TRACT | Status: DC

## 2015-02-28 MED ORDER — GLUCERNA SHAKE PO LIQD: 237.0000 mL | Freq: Two times a day (BID) | ORAL | Status: DC

## 2015-02-28 NOTE — Progress Notes (Addendum)
[facility] PROGRESS NOTE  Kyen Taite ZOX:[id number] DOB: 07-13-1941 DOA: 02/24/2015 PCP: No primary care provider on file.  Brief Summary  74 year old male with history of chronic systolic and diastolic heart failure, hypertension, diabetes type mellitus type II, cad based on cat scan imaging because patient previously declined cardiac catheterization, limited to no medical insight, medication compliance, lack of insurance. The patient came to the urgent care center because of tingling and pain in his left arm, shoulder, and back, shortness of breath, and pain in his feet. He was found to have heart failure exacerbation and was admitted for further evaluation. Since admission he has had repeated episodes of tingling in his left arm and back while at rest that have improved with nitroglycerin. This is concerning for unstable angina. He is diuresing. He is undergoing education about heart failure.  Assessment/Plan  Acute hypoxic respiratory failure secondary to acute on chronic systolic and diastolic heart failure, probable ischemic cardiomyopathy.  Creatinine trending down with diuresis. -  Weight down 2kg -  -525L yesterday -  Continue Lasix to 80 mg IV BID -  Patient is on beta blocker, Lasix -  Continue hydralazine and imdur -  No ARB/ACEI secondary to AKI -  BNP 1700 -  Echo:  Moderately to severely reduced systolic function with mild LVH, ejection fraction of 30-35% with diffuse hypokinesis. Grade 1 diastolic dysfunction with evidence of high ventricular filling pressure. Moderate dilation of the left atrium, moderate dilation of the right atrium, moderate TR, PA peak pressure of 43 mmHg and a trivial pericardial effusion. -  Repeat chest x-ray on 4/24:  Persistent vascular congestion/pulmonary edema  AKI, no recent contrast, resolving -  Holding spironolactone/ARB  -  FENa 5% (intrinsic) and RUS with normal kidneys, no obstruction, bladder wall thickening consistent with cystitis  however UA negative  Copious watery diarrhea -  C. Diff negative  CAD with intermittent tingling of his left arm and shoulder which improves with nitroglycerin which is concerning for possible unstable angina -  Patient declines heart catheterization -  Continue beta blocker, aspirin 81 mg -  Started high-dose statin -  Started imdur   COPD with recent exacerbation and just completed steroid taper -  Started solumedrol and transitioned to prednisone on 4/24 -  Continue duonebs and taper prednisone -  Continue dulera  Bilateral lower extremity callouses and ulcer left foot lateral aspect -  Appreciate Wound care consultation   Hypertension, blood pressure mildly elevated -  Continue metop -  Increase hydralazine if needed  Diabetes mellitus type 2, CBG mildly elevated secondary to steroids -  Holding sulfonylurea  -  Continue sliding scale insulin  -  Adjust insulin as needed based on CBGs  -  A1c pending  Iron deficiency anemia, hemoglobin at baseline of 12-13 -  Iron studies consistent with iron deficiency, B12 773 , folate 8.3, TSH 1.162 -  Started ferrous sulfate -  Recommend follow-up with gastroenterology  Noncompliance with appointments and lack of insurance, very limited and poor insight into medical conditions.  -  Social work and case management following -  Education with teach back about insurance, medications, and health problems  Smoking cessation -  Smoking cessation counseled  Hyponatremia likely related to heart failure and trending down  Diet:  Diabetic, low sodium Access:  PIV IVF:  off Proph:  scd  Code Status:   DNR Family Communication: patient alone Disposition Plan:  To SNF tomorrow   Consultants:  none  Procedures:  CXR  ECHO  RUS  Antibiotics:  none   HPI/Subjective:  Still has persistent cough but states that he is feeling less SOB.  Denies chest pain and tingling in the left arm.    Objective: Filed Vitals:    02/27/15 1954 02/27/15 2103 02/28/15 0558 02/28/15 0819  BP:  116/54 137/85   Pulse: 88 81 82 83  Temp:  97.4 F (36.3 C) 98 F (36.7 C)   TempSrc: Tympanic Oral Oral   Resp: Height:      Weight:      SpO2: 98% 100% 97% 97%    Intake/Output Summary (Last 24 hours) at 02/28/15 0937 Last data filed at 02/28/15 0858  Gross per 24 hour  Intake   1200 ml  Output   1375 ml  Net   -175 ml   Filed Weights   02/25/15 0632 02/26/15 0534 02/27/15 0603  Weight: 71.487 kg (157 lb 9.6 oz) 71.342 kg (157 lb 4.5 oz) 68.325 kg (150 lb 10.1 oz)    Exam:   General:   average weight male, No acute distress  HEENT:  NCAT, MMM  Cardiovascular:  RRR, nl S1, S2 no mrg, 2+ pulses, warm extremities  Respiratory:  diminished bilateral breath sounds, no rales or rhonchi.  Abdomen:   NABS, soft, NT/ND  MSK:   Normal tone and bulk,  trace ankle edema  Neuro:  Grossly intact  Skin:  Dry ulcer 4cm on lateral aspect of left foot and multiple callouses on both feet  Data Reviewed: Basic Metabolic Panel:  Recent Labs Lab 02/24/15 1610 02/24/15 1911 02/26/15 0550 02/27/15 0700 02/28/15 0345  NA 138 135 137 133* 131*  K 3.9 3.6 3.6 3.4* 3.6  CL 99 100 99 95* 92*  CO2  --  GLUCOSE 184* 160* 131* 221* 239*  BUN 26* 30*  CREATININE 1.00 0.99 1.28 1.36* 1.35  CALCIUM  --  8.7 8.5 8.4 8.4   Liver Function Tests: No results for input(s): AST, ALT, ALKPHOS, BILITOT, PROT, ALBUMIN in the last 168 hours. No results for input(s): LIPASE, AMYLASE in the last 168 hours. No results for input(s): AMMONIA in the last 168 hours. CBC:  Recent Labs Lab 02/24/15 1610 02/24/15 1911 02/26/15 0550 02/27/15 0700 02/28/15 0345  WBC  --  4.5 8.6 6.7 9.8  NEUTROABS  --  2.9  --   --   --   HGB 15.0 12.9* 11.1* 11.2* 11.6*  HCT 44.0 39.6 33.5* 34.1* 35.2*  MCV  --  89.2 88.2 89.3 88.4  PLT  --  175 164 165 174   Cardiac Enzymes:  Recent Labs Lab 02/24/15 2313  02/25/15 0420 02/25/15 1127  TROPONINI 0.03 0.03 0.03   BNP (last 3 results)  Recent Labs  12/11/14 1945 02/08/15 1502 02/24/15 1911  BNP 1884.5* 1355.4* 1739.5*    ProBNP (last 3 results)  Recent Labs  08/12/14 1756  PROBNP 7427.0*    CBG:  Recent Labs Lab 02/27/15 0604 02/27/15 1109 02/27/15 1643 02/27/15 2108 02/28/15 0605  GLUCAP 223* 210* 314* 202* 237*    Recent Results (from the past 240 hour(s))  Clostridium Difficile by PCR     Status: None   Collection Time: 02/28/15 12:09 AM  Result Value Ref Range Status   C difficile by pcr NEGATIVE NEGATIVE Final     Studies: US Renal  02/27/2015   CLINICAL DATA:  Acute renal injury.  Acute kidney injury.  EXAM: RENAL/URINARY TRACT ULTRASOUND COMPLETE  COMPARISON:  None.  FINDINGS: Right Kidney:  Length: 11.1 cm. Echogenicity within normal limits. No mass or hydronephrosis visualized.  Left Kidney:  Length: 11.2 cm. Echogenicity within normal limits. No mass or hydronephrosis visualized.  Bladder:  Nonspecific thickening of the bladder wall, measuring 5 mm.  Bilateral pleural effusions are incidentally noted.  IMPRESSION: 1. Normal appearance of the kidneys.  No obstruction. 2. Mural thickening of the bladder wall for the degree of distension. Findings suggest cystitis. 3. Pleural effusions.   Electronically Signed   By: Andreas NewportGeoffrey  Lamke M.D.   On: 02/27/2015 18:30   Dg Chest Port 1 View  02/27/2015   CLINICAL DATA:  Dyspnea/[place] M[place] of breath.  EXAM: PORTABLE CHEST - 1 VIEW  COMPARISON:  02/24/2015.  FINDINGS: The cardiopericardial silhouette is enlarged. Pulmonary vascular congestion with basilar predominant airspace disease. Small bilateral pleural effusions. Basilar atelectasis. Monitoring leads project over the chest.  IMPRESSION: Moderate CHF/ volume overload.   Electronically Signed   By: Andreas NewportGeoffrey  Lamke M.D.   On: 02/27/2015 15:55    Scheduled Meds: . aspirin  81 mg Oral Daily  . atorvastatin  80 mg Oral q1800  .  carvedilol  6.25 mg Oral BID WC  . feeding supplement (GLUCERNA SHAKE)  237 mL Oral BID BM  . ferrous sulfate  325 mg Oral BID WC  . fluticasone  2 spray Each Nare Daily  . furosemide  80 mg Intravenous BID  . guaiFENesin  600 mg Oral BID  . hydrALAZINE  10 mg Oral TID  . hydrocerin   Topical Daily  . insulin aspart  0-15 Units Subcutaneous TID WC  . insulin aspart  0-5 Units Subcutaneous QHS  . ipratropium-albuterol  3 mL Nebulization TID  . isosorbide mononitrate  60 mg Oral Daily  . loratadine  10 mg Oral Daily  . mometasone-formoterol  2 puff Inhalation BID  . nicotine  21 mg Transdermal Daily  . pantoprazole  40 mg Oral Daily  . predniSONE  50 mg Oral Q breakfast  . sodium chloride  3 mL Intravenous Q12H  . zolpidem  5 mg Oral QHS   Continuous Infusions:   Active Problems:   HTN (hypertension)   Type II diabetes mellitus   Acute on chronic combined systolic and diastolic heart failure   Foot ulcer   Acute respiratory failure with hypoxia   Tobacco abuse   Congestive heart failure   Unstable angina    Time spent: 30 min    [place] Inthavong, [facility] CenterMACKENZIE  [facility] Pager [phone]. If 7PM-7AM, please contact night-coverage at [email or web address], password [email or web address] 02/28/2015, 9:37 AM  LOS: 3 days

## 2015-02-28 NOTE — Progress Notes (Signed)
{  PT ALSATURATION QUALIFICATIONS: (This note is used to comply with regulatory documentation for home oxygen)  Patient Saturations on Room Air at Rest = 93%  Patient Saturations on Room Air while Ambulating = 88-89%   (92% at rest after amb)  Patient Saturations on  Liters of oxygen while Ambulating  ---n/a

## 2015-02-28 NOTE — Progress Notes (Signed)
CARE MANAGEMENT NOTE 02/28/2015  Patient:  Russell Hamilton,Russell Hamilton   Account Number:  [id number]  Date Initiated:  02/25/2015  Documentation initiated by:  AMERSON,JULIE  Subjective/Objective Assessment:   Pt adm on 02/24/15 with chest pain, CHF.  PTA, pt resides at home alone.  He is noncompliant with appts, and has had multiple admissions.     Action/Plan:   Will follow for dc needs as pt progresses. Would recommend HHRN at dc for follow up of CHF, if pt will agree.   Anticipated DC Date:  02/28/2015   Anticipated DC Plan:  HOME W HOME HEALTH SERVICES      DC Planning Services  CM consult  [facility]  PCP issues      [facility]   Choice offered to / List presented to:  C-1 Patient           Status of service:  In process, will continue to follow Medicare Important Message given?  YES (If response is "NO", the following Medicare IM given date fields will be blank) Date Medicare IM given:  02/28/2015 Medicare IM given by:  [facility],Ruthia Person Date Additional Medicare IM given:   Additional Medicare IM given by:    Discharge Disposition:    Per UR Regulation:    If discussed at Long Length of Stay Meetings, dates discussed:    Comments:  02/28/2015 1600 NCM spoke to pt and [facility] Provided [facility] with [facility]. [facility] States no DME is needed. Waiting final recommendations for home. Contacted [facility] with new referral. Russell DonningAlesia Hamilton Dise RN CCM Case Mgmt phone [phone]  02/25/15 Sidney AceJulie Amerson, RN, BSN [phone] [facility] at 4[facility].  Pt prefers afternoon appt due to transportation issues.

## 2015-02-28 NOTE — Progress Notes (Signed)
Physical Therapy Evaluation Patient Details Name: Russell Hamilton MRN: [id number] DOB: 1941/01/17 Today's Date: 02/28/2015   History of Present Illness  Russell Hamilton is a 74 y.o. male adm 02/24/15 with  acute systolic HF;   PMHx: chronic congestive heart failure, hypertension and diabetes mellitus type   Clinical Impression  Pt admitted with above diagnosis. Pt currently with functional limitations due to the deficits listed below (see PT Problem List).  Pt will benefit from skilled PT to increase their independence and safety with mobility to allow discharge to the venue listed below.  Pt agreeable to SNF when discussed, MD present     Follow Up Recommendations SNF    Equipment Recommendations  None recommended by PT    Recommendations for Other Services       Precautions / Restrictions Precautions Precautions: Fall Precaution Comments: chronic L foot ulcer Restrictions Weight Bearing Restrictions: No      Mobility  Bed Mobility Overal bed mobility: Needs Assistance Bed Mobility: Supine to Sit     Supine to sit: Supervision     General bed mobility comments: increased time and close supervision to prevent posterior LOB  Transfers Overall transfer level: Needs assistance Equipment used: Straight cane Transfers: Sit to/from Stand Sit to Stand: Supervision         General transfer comment: close supervision for safety; incr time to come to standing position  Ambulation/Gait Ambulation/Gait assistance: Min assist;Mod assist Ambulation Distance (Feet): 80 Feet Assistive device: Straight cane Gait Pattern/deviations: Step-through pattern;Staggering left;Staggering right;Leaning posteriorly;Decreased stride length     General Gait Details: pt with 2 standing rest breaks d/t feeling "shaky", LEs trembling; pt recovered from these episodes in <30sec; Pt requires cues for obstacle negotiation, breathing and to slow gait speed; pt with overt LOB   x 2 requiring mod assist to  recover and prevent fall  Stairs            Wheelchair Mobility    Modified Rankin (Stroke Patients Only)       Balance Overall balance assessment: Needs assistance;History of Falls (pt reports falls "onto bed" or having to sit very quickly at home; he has not fallen "to the floor" per his report) Sitting-balance support: Feet supported;No upper extremity supported Sitting balance-Leahy Scale: Good     Standing balance support: Single extremity supported Standing balance-Leahy Scale: Poor               High level balance activites: Direction changes;Turns;Head turns High Level Balance Comments: LOB x 2 with mod  assist recovery             Pertinent Vitals/Pain Pain Assessment: 0-10 Pain Score: 3  Pain Location: L foot Pain Descriptors / Indicators: Tender Pain Intervention(s): Limited activity within patient's tolerance;Monitored during session  See note for O2 sats    Home Living Family/patient expects to be discharged to:: Skilled nursing facility Living Arrangements: Alone Available Help at Discharge: Family;Available PRN/intermittently Type of Home: House Home Access: Stairs to enter Entrance Stairs-Rails: Right;Left;Can reach both Entrance Stairs-Number of Steps: 8 Home Layout: One level Home Equipment: Cane - single point      Prior Function Level of Independence: Independent with assistive device(s)         Comments: Uses SPC      Hand Dominance        Extremity/Trunk Assessment   Upper Extremity Assessment: Overall WFL for tasks assessed;Defer to OT evaluation           Lower Extremity Assessment: Generalized  weakness         Communication   Communication: No difficulties  Cognition Arousal/Alertness: Awake/alert Behavior During Therapy: WFL for tasks assessed/performed Overall Cognitive Status: Within Functional Limits for tasks assessed                      General Comments      Exercises         Assessment/Plan    PT Assessment Patient needs continued PT services  PT Diagnosis Difficulty walking   PT Problem List Decreased strength;Decreased range of motion;Decreased activity tolerance;Decreased balance;Decreased mobility  PT Treatment Interventions DME instruction;Gait training;Stair training;Functional mobility training;Therapeutic activities;Patient/family education;Balance training;Therapeutic exercise   PT Goals (Current goals can be found in the Care Plan section) Acute Rehab PT Goals Patient Stated Goal: to get stronger PT Goal Formulation: With patient Time For Goal Achievement: 03/14/15 Potential to Achieve Goals: Good    Frequency Min 3X/week   Barriers to discharge Decreased caregiver support;Inaccessible home environment      Co-evaluation               End of Session Equipment Utilized During Treatment: Gait belt Activity Tolerance: Patient tolerated treatment well Patient left: in bed;with call bell/phone within reach;Other (comment) (MD present)           Time: [id number] PT Time Calculation (min) (ACUTE ONLY): 17 min   Charges:   PT Evaluation $Initial PT Evaluation Tier I: 1 Procedure     PT G Codes:        Maryagnes Carrasco 02/28/2015, 10:18 AM

## 2015-02-28 NOTE — Discharge Summary (Addendum)
Physician Discharge Summary  Russell Hamilton ZOX:[id number] DOB: 1941/02/24 DOA: 02/24/2015  PCP: No primary care provider on file.  Admit date: 02/24/2015 Discharge date: 03/01/2015  Recommendations for Outpatient Follow-up:  1.  Patient declined SNF and was sent home with [facility] RN/PT/OT 2.   Follow-up with primary care doctor within 1 week of discharge to review weight, repeat BMP to check sodium, potassium, creatinine. Follow-up anemia with repeat CBC. Ongoing management of diabetes mellitus. 3.   Follow-up with cardiology within 2 weeks of discharge 4.   Primary care to please refer to gastroenterology for iron deficiency anemia   Discharge Diagnoses:  Principal Problem:   Acute on chronic combined systolic and diastolic heart failure Active Problems:   HTN (hypertension)   Type II diabetes mellitus with renal manifestations, uncontrolled   Foot ulcer   Acute respiratory failure with hypoxia   Tobacco abuse   Congestive heart failure   Unstable angina   Chronic kidney disease, stage 3   Discharge Condition:  Stable, improved  Diet recommendation:  Diabetic, low-sodium  Wt Readings from Last 3 Encounters:  03/01/15 65.772 kg (145 lb)  02/10/15 67.767 kg (149 lb 6.4 oz)  12/15/14 69.219 kg (152 lb 9.6 oz)    History of present illness:  74 year old male with history of chronic systolic and diastolic heart failure, hypertension, diabetes type mellitus type II, cad based on cat scan imaging because patient previously declined cardiac catheterization, limited to no medical insight, medication compliance, lack of insurance. The patient came to the urgent care center because of tingling and pain in his left arm, shoulder, and back, shortness of breath, and pain in his feet. He was found to have heart failure exacerbation and was admitted for further evaluation. Since admission he has had repeated episodes of tingling in his left arm and back while at rest that have improved with nitroglycerin.   His symptoms are concerning for unstable angina. He was diuresed but continued to have pulmonary edema on CXR with persistent cough and SOB.  He underwent education about heart failure.    Hospital Course:   Acute hypoxic respiratory failure secondary to acute on chronic systolic and diastolic heart failure, probable ischemic cardiomyopathy. Creatinine trending down with diuresis  And has remained stable around 1.3. - Weight  At the time of discharge is 65.7 kg - -3L yesterday -  Diuresed with Lasix to 80 mg IV BID - Patient is on beta blocker -   Recommend Lasix 80 mg by mouth twice a day at discharge - Continue hydralazine and imdur - No ARB/ACEI secondary to AKI - BNP 1700 - Echo: Moderately to severely reduced systolic function with mild LVH, ejection fraction of 30-35% with diffuse hypokinesis. Grade 1 diastolic dysfunction with evidence of high ventricular filling pressure. Moderate dilation of the left atrium, moderate dilation of the right atrium, moderate TR, PA peak pressure of 43 mmHg and a trivial pericardial effusion. - Repeat chest x-ray on 4/24: Persistent vascular congestion/pulmonary edema  AKI, no recent contrast,  Improved with diuresis and remained stable around 1.3 thereafter. -  no spironolactone/ARB  secondary to presumed CKD stage 3 - FENa 5% (intrinsic) and RUS with normal kidneys, no obstruction, bladder wall thickening consistent with cystitis however UA negative  Copious watery diarrhea - C. Diff negative  CAD with intermittent tingling of his left arm and shoulder which improves with nitroglycerin which is concerning for possible unstable angina - Patient declines heart catheterization - Continue beta blocker, aspirin 81 mg -  Started high-dose statin - Started imdur   COPD with recent exacerbation and just completed steroid taper - Started solumedrol and transitioned to prednisone on 4/24 - Continue duonebs and taper prednisone -  Continue dulera -   Start Spiriva -   Follow-up with pulmonology within 2 weeks of discharge  Bilateral lower extremity callouses and ulcer left foot lateral aspect - Wound care  Was consulted  Hypertension, blood pressure  stable - Continue metop , hydralazine  Diabetes mellitus type 2,  Uncontrolled with development of chronic kidney disease stage III, CBG mildly elevated secondary to steroids -  resume sulfonylurea  at discharge -  Given sliding scale insulin during hospitalization - A1c 9.3   Iron deficiency anemia, hemoglobin at baseline of 12-13 - Iron studies consistent with iron deficiency, B12 773 , folate 8.3, TSH 1.162 - Started ferrous sulfate - Recommend follow-up with gastroenterology  Noncompliance with appointments and lack of insurance, very limited and poor insight into medical conditions.  - Social work and case management following - Education with teach back about insurance, medications, and health problems  Smoking cessation - Smoking cessation counseled  Hyponatremia likely related to heart failure and remained stable around 12/06/2031  Severe protein calorie malnutrition -  Continue supplements at discharge  Consultants:  none  Procedures:  CXR  ECHO  RUS  Antibiotics:  None  Discharge Exam: Filed Vitals:   03/01/15 0626  BP: 139/87  Pulse: 48  Temp: 97.5 F (36.4 C)  Resp: 18   Filed Vitals:   02/28/15 2106 02/28/15 2123 03/01/15 0626 03/01/15 0836  BP:  116/42 139/87   Pulse: 80 90 48   Temp:  97.4 F (36.3 C) 97.5 F (36.4 C)   TempSrc:  Oral Oral   Resp: 18 18 18    Height:      Weight:   65.772 kg (145 lb)   SpO2:  99% 100% 97%     General: average weight male, No acute distress  HEENT: NCAT, MMM  Cardiovascular: RRR, nl S1, S2 no mrg, 2+ pulses, warm extremities  Respiratory: diminished bilateral breath sounds, full expiratory wheeze, no rales or rhonchi.  Abdomen: NABS, soft,  NT/ND  MSK: Normal tone and bulk, no ankle edema  Neuro: Grossly intact  Skin: Dry ulcer 4cm on lateral aspect of left foot and multiple callouses on both feet  Discharge Instructions      Discharge Instructions    (HEART FAILURE PATIENTS) Call MD:  Anytime you have any of the following symptoms: 1) 3 pound weight gain in 24 hours or 5 pounds in 1 week 2) shortness of breath, with or without a dry hacking cough 3) swelling in the hands, feet or stomach 4) if you have to sleep on extra pillows at night in order to breathe.    Complete by:  As directed      Call MD for:  difficulty breathing, headache or visual disturbances    Complete by:  As directed      Call MD for:  extreme fatigue    Complete by:  As directed      Call MD for:  hives    Complete by:  As directed      Call MD for:  persistant dizziness or light-headedness    Complete by:  As directed      Call MD for:  persistant nausea and vomiting    Complete by:  As directed      Call MD for:  severe uncontrolled  pain    Complete by:  As directed      Call MD for:  temperature >100.4    Complete by:  As directed      Diet - low sodium heart healthy    Complete by:  As directed      Diet Carb Modified    Complete by:  As directed      Discharge instructions    Complete by:  As directed   You were hospitalized with heart failure and COPD.  Please increase your lasix to  twice daily and take potassium once a day.  Weigh yourself every day and if you gain more than 3-lbs in one day or 5-lbs from the time that you are discharged from the hospital, please seek immediate medical attention.  If you develop worsening shortness of breath or chest pains, please call 911.  For your COPD, I have started you on dulera and spiriva to help PREVENT wheezing.  Please take these medications every day to prevent wheezing.  Please use albuterol as needed for shortness of breath.  Please take prednisone in decreasing doses for the  next week.     Increase activity slowly    Complete by:  As directed             Medication List    STOP taking these medications        azithromycin 500 MG tablet  Commonly known as:  ZITHROMAX     gabapentin 300 MG capsule  Commonly known as:  NEURONTIN     losartan 25 MG tablet  Commonly known as:  COZAAR      TAKE these medications        albuterol 108 (90 BASE) MCG/ACT inhaler  Commonly known as:  PROVENTIL HFA;VENTOLIN HFA  Inhale 2 puffs into the lungs every 6 (six) hours as needed for wheezing or shortness of breath.     aspirin 81 MG chewable tablet  Chew 1 tablet (81 mg total) by mouth daily.     atorvastatin 80 MG tablet  Commonly known as:  LIPITOR  Take 1 tablet (80 mg total) by mouth daily at 6 PM.     benzonatate 200 MG capsule  Commonly known as:  TESSALON  Take 1 capsule (200 mg total) by mouth 2 (two) times daily.     carvedilol 6.25 MG tablet  Commonly known as:  COREG  Take 1 tablet (6.25 mg total) by mouth 2 (two) times daily with a meal.     chlorpheniramine-HYDROcodone 10-8 MG/5ML Lqcr  Commonly known as:  TUSSIONEX  Take 5 mLs by mouth every 12 (twelve) hours.     collagenase ointment  Commonly known as:  SANTYL  Apply topically daily.     feeding supplement (GLUCERNA SHAKE) Liqd  Take 237 mLs by mouth 2 (two) times daily between meals.     ferrous sulfate 325 (65 FE) MG tablet  Take 1 tablet (325 mg total) by mouth 2 (two) times daily with a meal.     fluticasone 50 MCG/ACT nasal spray  Commonly known as:  FLONASE  Place 2 sprays into both nostrils daily.     furosemide 80 MG tablet  Commonly known as:  LASIX  Take 1 tablet (80 mg total) by mouth 2 (two) times daily.     glimepiride 2 MG tablet  Commonly known as:  AMARYL  Take 1 tablet (2 mg total) by mouth daily with breakfast.     hydrALAZINE 10  MG tablet  Commonly known as:  APRESOLINE  Take 1 tablet (10 mg total) by mouth 3 (three) times daily.     isosorbide  mononitrate 60 MG 24 hr tablet  Commonly known as:  IMDUR  Take 1 tablet (60 mg total) by mouth daily.     loratadine 10 MG tablet  Commonly known as:  CLARITIN  Take 1 tablet (10 mg total) by mouth daily.     mometasone-formoterol 100-5 MCG/ACT Aero  Commonly known as:  DULERA  Inhale 2 puffs into the lungs 2 (two) times daily.     nicotine 21 mg/24hr patch  Commonly known as:  NICODERM CQ - dosed in mg/24 hours  Place 1 patch (21 mg total) onto the skin daily.     pantoprazole 40 MG tablet  Commonly known as:  PROTONIX  Take 1 tablet (40 mg total) by mouth daily.     potassium chloride SA 20 MEQ tablet  Commonly known as:  K-DUR,KLOR-CON  Take 1 tablet (20 mEq total) by mouth daily.     predniSONE 10 MG tablet  Commonly known as:  DELTASONE  - Take 4 tablets (40 mg) daily for 2 days, then,  - Take 3 tablets (30 mg) daily for 2 days, then,  - Take 2 tablets (20 mg) daily for 2 days, then,  - Take 1 tablets (10 mg) daily for 1 days, then stop     tiotropium 18 MCG inhalation capsule  Commonly known as:  SPIRIVA HANDIHALER  Place 1 capsule (18 mcg total) into inhaler and inhale daily.     traMADol 50 MG tablet  Commonly known as:  ULTRAM  Take 1 tablet (50 mg total) by mouth every 6 (six) hours as needed for moderate pain (cough).       Follow-up Information    Follow up with [facility].   Why:  Referral faxed to [facility] [phone]  for wound care.nurse, start of care    Contact information:   [address] [place] [address] [phone]       Please follow up.   Why:  Referral made to [facility] 4/21. Care Manager will contact patient by phone 2-3 business day post discharrge      Follow up with [facility]     On 03/04/2015.   Why:  4:00   please bring all medications you are taking, copy of discharge instructions, photo ID and $20 copay if you are able.   Contact  information:   [address] [place] [address] [phone]      Follow up with [facility]. Schedule an appointment as soon as possible for a visit in 2 weeks.   Specialty:  Cardiology   Contact information:   [address], [address] [place] [address] [phone]       The results of significant diagnostics from this hospitalization (including imaging, microbiology, ancillary and laboratory) are listed below for reference.    Significant Diagnostic Studies: Dg Chest 2 View  02/24/2015   CLINICAL DATA:  Shortness of breath, coughing.  EXAM: CHEST  2 VIEW  COMPARISON:  February 08, 2015.  FINDINGS: Stable cardiomediastinal silhouette. No pneumothorax is noted. Stable moderate size bilateral pleural effusions are noted compared to prior exam mild interstitial densities are noted throughout both lungs suggesting pulmonary edema. Degenerative change of both acromioclavicular joints is noted.  IMPRESSION: Stable  cardiomegaly with associated bilateral pulmonary edema and pleural effusions concerning for congestive heart failure.   Electronically Signed   By: Lupita RaiderJames  Green Jr, M.D.   On: 02/24/2015 16:46   Dg Chest 2 View  02/08/2015   CLINICAL DATA:  Cough and shortness of breath for 1 day. Swelling and numbness of the feet.  EXAM: CHEST  2 VIEW  COMPARISON:  12/11/2014  FINDINGS: Bibasilar airspace opacities obscuring the hemidiaphragms, similar to prior. Mild enlargement of the cardiopericardial silhouette moderate size bilateral pleural effusions.  Thoracic spondylosis.  Emphysema.  IMPRESSION: 1. Similar appearance of cardiomegaly and moderate size bilateral pleural effusions with passive atelectasis compared to 12/11/14.   Electronically Signed   By: Gaylyn RongWalter  Liebkemann M.D.   On: 02/08/2015 15:54   [place] Renal  02/27/2015   CLINICAL DATA:  Acute renal injury.  Acute kidney injury.  EXAM: RENAL/URINARY TRACT ULTRASOUND COMPLETE   COMPARISON:  None.  FINDINGS: Right Kidney:  Length: 11.1 cm. Echogenicity within normal limits. No mass or hydronephrosis visualized.  Left Kidney:  Length: 11.2 cm. Echogenicity within normal limits. No mass or hydronephrosis visualized.  Bladder:  Nonspecific thickening of the bladder wall, measuring 5 mm.  Bilateral pleural effusions are incidentally noted.  IMPRESSION: 1. Normal appearance of the kidneys.  No obstruction. 2. Mural thickening of the bladder wall for the degree of distension. Findings suggest cystitis. 3. Pleural effusions.   Electronically Signed   By: Andreas NewportGeoffrey  Lamke M.D.   On: 02/27/2015 18:30   Dg Chest Port 1 View  02/27/2015   CLINICAL DATA:  Dyspnea/Cael Worth of breath.  EXAM: PORTABLE CHEST - 1 VIEW  COMPARISON:  02/24/2015.  FINDINGS: The cardiopericardial silhouette is enlarged. Pulmonary vascular congestion with basilar predominant airspace disease. Small bilateral pleural effusions. Basilar atelectasis. Monitoring leads project over the chest.  IMPRESSION: Moderate CHF/ volume overload.   Electronically Signed   By: Andreas NewportGeoffrey  Lamke M.D.   On: 02/27/2015 15:55   Dg Foot Complete Left  02/24/2015   CLINICAL DATA:  Patient hospitalized 2 weeks ago for foot infection. History of diabetes. Now with sores involving the left foot. No known injury.  EXAM: LEFT FOOT - COMPLETE 3+ VIEW  COMPARISON:  11/28/2014  FINDINGS: There is apparent soft tissue defect involving the lateral mid aspect of the foot. This finding is without associated radiopaque foreign body, subcutaneous emphysema or discrete area of osteolysis to suggest osteomyelitis.  No fracture or dislocation. Joint spaces are preserved. No erosions. Minimal enthesopathic change of the Achilles tendon insertion site. Distal vascular calcifications compatible with provided history of diabetes.  IMPRESSION: Soft tissue defect involving the lateral mid aspect of the foot without radiopaque foreign body or radiographic evidence of  osteomyelitis.   Electronically Signed   By: Simonne ComeJohn  Watts M.D.   On: 02/24/2015 16:45    Microbiology: Recent Results (from the past 240 hour(s))  Clostridium Difficile by PCR     Status: None   Collection Time: 02/28/15 12:09 AM  Result Value Ref Range Status   C difficile by pcr NEGATIVE NEGATIVE Final     Labs: Basic Metabolic Panel:  Recent Labs Lab 02/24/15 1911 02/26/15 0550 02/27/15 0700 02/28/15 0345 03/01/15 0510  NA 135 137 133* 131* 132*  K 3.6 3.6 3.4* 3.6 3.8  CL 100 99 95* 92* 92*  CO2 28 28 28 29 31   GLUCOSE 160* 131* 221* 239* 201*  BUN 11 21 26* 30* 33*  CREATININE 0.99 1.28 1.36* 1.35 1.36*  CALCIUM 8.7 8.5  8.4 8.4 8.6   Liver Function Tests: No results for input(s): AST, ALT, ALKPHOS, BILITOT, PROT, ALBUMIN in the last 168 hours. No results for input(s): LIPASE, AMYLASE in the last 168 hours. No results for input(s): AMMONIA in the last 168 hours. CBC:  Recent Labs Lab 02/24/15 1911 02/26/15 0550 02/27/15 0700 02/28/15 0345 03/01/15 0510  WBC 4.5 8.6 6.7 9.8 8.8  NEUTROABS 2.9  --   --   --   --   HGB 12.9* 11.1* 11.2* 11.6* 11.9*  HCT 39.6 33.5* 34.1* 35.2* 36.4*  MCV 89.2 88.2 89.3 88.4 87.7  PLT 175 164 165 174 180   Cardiac Enzymes:  Recent Labs Lab 02/24/15 2313 02/25/15 0420 02/25/15 1127  TROPONINI 0.03 0.03 0.03   BNP: BNP (last 3 results)  Recent Labs  12/11/14 1945 02/08/15 1502 02/24/15 1911  BNP 1884.5* 1355.4* 1739.5*    ProBNP (last 3 results)  Recent Labs  08/12/14 1756  PROBNP 7427.0*    CBG:  Recent Labs Lab 02/28/15 0605 02/28/15 1203 02/28/15 1713 02/28/15 2120 03/01/15 0614  GLUCAP 237* 173* 365* 275* 190*    Time coordinating discharge: 35 minutes  Signed:  Maris Bena  [facility] 03/01/2015, 10:38 AM

## 2015-02-28 NOTE — Clinical Documentation Improvement (Signed)
INITIAL NUTRITION ASSESSMENT  02/25/2015 RD notes criteria for severe malnutrition in the context of chronic illness as evidenced by severe subcutaneous fat & muscle loss.  Using your clinical judgement, please indicate if pt. meets criteria.   . Severity: --Mild (first degree) --Moderate (second degree) --Severe (third degree) __Other . Avoid documenting a range of severity, such as "moderate to severe"     Supporting Information: chronic wounds to ankle/toe/foot, re-admissions to hospital,   PO & supplemental intake, weight, labs, I/O's  Thank You, Elpidio AnisGarnet Zaidin Blyden, RN, BSN, CDI [phone] [facility].

## 2015-03-01 DIAGNOSIS — N183 Chronic kidney disease, stage 3 unspecified: Secondary | ICD-10-CM

## 2015-03-01 DIAGNOSIS — E1122 Type 2 diabetes mellitus with diabetic chronic kidney disease: Secondary | ICD-10-CM | POA: Insufficient documentation

## 2015-03-01 DIAGNOSIS — N189 Chronic kidney disease, unspecified: Secondary | ICD-10-CM

## 2015-03-01 LAB — CBC
HEMATOCRIT: 36.4 % — AB (ref 39.0–52.0)
Hemoglobin: 11.9 g/dL — ABNORMAL LOW (ref 13.0–17.0)
MCH: 28.7 pg (ref 26.0–34.0)
MCHC: 32.7 g/dL (ref 30.0–36.0)
MCV: 87.7 fL (ref 78.0–100.0)
Platelets: 180 10*3/uL (ref 150–400)
RBC: 4.15 MIL/uL — AB (ref 4.22–5.81)
RDW: 15.4 % (ref 11.5–15.5)
WBC: 8.8 10*3/uL (ref 4.0–10.5)

## 2015-03-01 LAB — BASIC METABOLIC PANEL
ANION GAP: 9 (ref 5–15)
BUN: 33 mg/dL — ABNORMAL HIGH (ref 6–23)
CALCIUM: 8.6 mg/dL (ref 8.4–10.5)
CO2: 31 mmol/L (ref 19–32)
CREATININE: 1.36 mg/dL — AB (ref 0.50–1.35)
Chloride: 92 mmol/L — ABNORMAL LOW (ref 96–112)
GFR calc Af Amer: 59 mL/min — ABNORMAL LOW (ref >90)
GFR calc non Af Amer: 51 mL/min — ABNORMAL LOW (ref >90)
GLUCOSE: 201 mg/dL — AB (ref 70–99)
Potassium: 3.8 mmol/L (ref 3.5–5.1)
SODIUM: 132 mmol/L — AB (ref 135–145)

## 2015-03-01 LAB — GLUCOSE, CAPILLARY
GLUCOSE-CAPILLARY: 190 mg/dL — AB (ref 70–99)
GLUCOSE-CAPILLARY: 157 mg/dL — AB (ref 70–99)

## 2015-03-01 MED ORDER — POTASSIUM CHLORIDE CRYS ER 20 MEQ PO TBCR: 20.0000 meq | EXTENDED_RELEASE_TABLET | Freq: Every day | ORAL | Status: DC

## 2015-03-01 MED ORDER — FUROSEMIDE 80 MG PO TABS
80.0000 mg | ORAL_TABLET | Freq: Two times a day (BID) | ORAL | Status: DC
Start: 2015-03-01 — End: 2015-04-01

## 2015-03-01 MED ORDER — PREDNISONE 10 MG PO TABS: ORAL_TABLET | ORAL | Status: DC

## 2015-03-01 MED ORDER — TIOTROPIUM BROMIDE MONOHYDRATE 18 MCG IN CAPS
18.0000 ug | ORAL_CAPSULE | Freq: Every day | RESPIRATORY_TRACT | Status: DC
Start: 2015-03-01 — End: 2015-05-12

## 2015-03-01 NOTE — Progress Notes (Signed)
Pt up at nurses station stating he is not going to SNF and he is ready to go home and not willing to wait anymore.  SW with pt and offered pt a voucher for a taxi ride home.  Pt declined saying he would call yellow cab himself and demanded for somebody to wheel him to subway before he goes home.  Pt verbalized understanding of DC instructions.  NT took pt downstairs.

## 2015-03-04 ENCOUNTER — Inpatient Hospital Stay: Payer: Medicare (Managed Care) | Admitting: Family Medicine

## 2015-03-17 ENCOUNTER — Encounter (HOSPITAL_COMMUNITY): Payer: Self-pay | Admitting: Emergency Medicine

## 2015-03-17 ENCOUNTER — Emergency Department (INDEPENDENT_AMBULATORY_CARE_PROVIDER_SITE_OTHER)
Admission: EM | Admit: 2015-03-17 | Discharge: 2015-03-17 | Disposition: A | Discharge status: Home / Self Care | Payer: Medicare (Managed Care) | Source: Home / Self Care | Attending: Family Medicine | Admitting: Family Medicine

## 2015-03-17 ENCOUNTER — Encounter: Payer: Medicare (Managed Care) | Admitting: Nurse Practitioner

## 2015-03-17 DIAGNOSIS — G629 Polyneuropathy, unspecified: Secondary | ICD-10-CM

## 2015-03-17 MED ORDER — GABAPENTIN 300 MG PO CAPS: 300.0000 mg | ORAL_CAPSULE | Freq: Three times a day (TID) | ORAL | Status: DC

## 2015-03-17 NOTE — Discharge Instructions (Signed)
Thank you for coming in today. Taking gabapentin 3 times daily Call and set up an appointment with one of the doctors below   PRIMARY CARE CytogeneticistDOCTORS  [facility] [address]  [place], [place] [phone]  Fax [phone]  [facility] [address]  [place], [place] [phone]  Fax [phone]  [facility] [place] [facility] [place] [phone]  [place], [place] [phone]  Fax [phone]  [facility] [address]  [place], [place] [phone]  Fax [phone]  [facility] [address]  [place], [place] [phone]  Fax [phone]  [facility] [address] [place]  [place], [place] [phone]  Fax [phone]   [facility] [place] [address] [place] [address] Phone: [phone]   [facility] [address] [place] [address] Phone: [phone]   [facility] @ [place] [address] [place] [address] Phone: [phone]   [facility] @ [place] [address] [place] [address] Phone: [phone]   [facility] @ [place] [address] [place] [address] Phone: [phone] Fax: [phone]   [facility] @ [place] [address] [place] [address] Phone: [phone]   Dr. Maryelizabeth RowanElizabeth Dewey [address] [place] [address] [phone]    Peripheral Neuropathy Peripheral neuropathy is a type of nerve damage. It affects nerves that carry signals between the spinal cord and other parts of the body. These are called peripheral nerves. With peripheral neuropathy, one nerve or a group of nerves may be  damaged.  CAUSES  Many things can damage peripheral nerves. For some people with peripheral neuropathy, the cause is unknown. Some causes include:  Diabetes. This is the most common cause of peripheral neuropathy.  Injury to a nerve.  Pressure or stress on a nerve that lasts a long time.  Too little vitamin B. Alcoholism can lead to this.  Infections.  Autoimmune diseases, such as multiple sclerosis and systemic lupus erythematosus.  Inherited nerve diseases.  Some medicines, such as cancer drugs.  Toxic substances, such as lead and mercury.  Too little blood flowing to the legs.  Kidney disease.  Thyroid disease. SIGNS AND SYMPTOMS  Different people have different symptoms. The symptoms you have will depend on which of your nerves is damaged. Common symptoms include:  Loss of feeling (numbness) in the feet and hands.  Tingling in the feet and hands.  Pain that burns.  Very sensitive skin.  Weakness.  Not being able to move a part of the body (paralysis).  Muscle twitching.  Clumsiness or poor coordination.  Loss of balance.  Not being able to control your bladder.  Feeling dizzy.  Sexual problems. DIAGNOSIS  Peripheral neuropathy is a symptom, not a disease. Finding the cause of peripheral neuropathy can be hard. To figure that out, your health care provider will take a medical history and do a physical exam. A neurological exam will also be done. This involves checking things affected by your brain, spinal cord, and nerves (nervous system). For example, your health care provider will check your reflexes, how you move, and what you can  feel.  Other types of tests may also be ordered, such as:  Blood tests.  A test of the fluid in your spinal cord.  Imaging tests, such as CT scans or an MRI.  Electromyography (EMG). This test checks the nerves that control muscles.  Nerve conduction velocity tests. These tests check how fast messages pass through your  nerves.  Nerve biopsy. A small piece of nerve is removed. It is then checked under a microscope. TREATMENT   Medicine is often used to treat peripheral neuropathy. Medicines may include:  Pain-relieving medicines. Prescription or over-the-counter medicine may be suggested.  Antiseizure medicine. This may be used for pain.  Antidepressants. These also may help ease pain from neuropathy.  Lidocaine. This is a numbing medicine. You might wear a patch or be given a shot.  Mexiletine. This medicine is typically used to help control irregular heart rhythms.  Surgery. Surgery may be needed to relieve pressure on a nerve or to destroy a nerve that is causing pain.  Physical therapy to help movement.  Assistive devices to help movement. HOME CARE INSTRUCTIONS   Only take over-the-counter or prescription medicines as directed by your health care provider. Follow the instructions carefully for any given medicines. Do not take any other medicines without first getting approval from your health care provider.  If you have diabetes, work closely with your health care provider to keep your blood sugar under control.  If you have numbness in your feet:  Check every day for signs of injury or infection. Watch for redness, warmth, and swelling.  Wear padded socks and comfortable shoes. These help protect your feet.  Do not do things that put pressure on your damaged nerve.  Do not smoke. Smoking keeps blood from getting to damaged nerves.  Avoid or limit alcohol. Too much alcohol can cause a lack of B vitamins. These vitamins are needed for healthy nerves.  Develop a good support system. Coping with peripheral neuropathy can be stressful. Talk to a mental health specialist or join a support group if you are struggling.  Follow up with your health care provider as directed. SEEK MEDICAL CARE IF:   You have new signs or symptoms of peripheral neuropathy.  You are struggling emotionally  from dealing with peripheral neuropathy.  You have a fever. SEEK IMMEDIATE MEDICAL CARE IF:   You have an injury or infection that is not healing.  You feel very dizzy or begin vomiting.  You have chest pain.  You have trouble breathing. Document Released: 10/12/2002 Document Revised: 07/04/2011 Document Reviewed: 06/29/2013 [facility] Patient Information 2015 [employer] [place]. This information is not intended to replace advice given to you by your health care provider. Make sure you discuss any questions you have with your health care provider.

## 2015-03-17 NOTE — ED Notes (Signed)
C/o left foot infection States he is here for a recheck States foot had an infection two months ago States area started as a blister

## 2015-03-17 NOTE — ED Provider Notes (Signed)
Russell MathJames Hamilton is a 74 y.o. male who presents to Urgent Care today for left foot pain. Patient has chronic numbness tingling and pain in his left foot. This is been ongoing for some time now. Additionally he has healing ulcers on his feet. These have all been previously evaluated and thought to be related to peripheral neuropathy and also to his underlying diabetes and heart failure. He recently moved to the area about 2 months ago and has not yet established with a primary care provider. In the past Russell Hamilton taking gabapentin which helped a little. No fevers or chills. No chest pains or palpitations or shortness of breath.   Past Medical History  Diagnosis Date  . Hypertension   . Chronic systolic CHF (congestive heart failure)     a. 2D ECHO 08/13/14: EF 40%, diffuse hypokinesis possibly worse in the inferior wall. Mild MR, mild LA dilation, PA pressure 50. Trivial pericardial effusion.  . Acute systolic CHF (congestive heart failure), NYHA class 4 08/12/2014  . Type II diabetes mellitus   . GERD (gastroesophageal reflux disease)   . Peripheral neuropathy     Russell Hamilton/notes 08/12/2014  . Stroke 2010    "memory problems since; right leg and left arm weakness since" (02/08/2015)  . Ulcers of both lower extremities     Russell Hamilton/notes 02/24/2015  . Noncompliance     Russell Hamilton/notes 02/24/2015   Past Surgical History  Procedure Laterality Date  . Carotid endarterectomy Right 2010  . Hand ligament reconstruction Right ?1970's    "pinky"   History  Substance Use Topics  . Smoking status: Current Every Day Smoker -- 0.75 packs/day for 56 years    Types: Cigarettes  . Smokeless tobacco: Never Used  . Alcohol Use: Yes     Comment: 02/08/2015 "I might have a beer q 6 months"   ROS as above Medications: No current facility-administered medications for this encounter.   Current Outpatient Prescriptions  Medication Sig Dispense Refill  . albuterol (PROVENTIL HFA;VENTOLIN HFA) 108 (90 BASE) MCG/ACT inhaler Inhale 2 puffs into the  lungs every 6 (six) hours as needed for wheezing or shortness of breath. 1 Inhaler 0  . aspirin 81 MG chewable tablet Chew 1 tablet (81 mg total) by mouth daily. 30 tablet 3  . atorvastatin (LIPITOR) 80 MG tablet Take 1 tablet (80 mg total) by mouth daily at 6 PM. 30 tablet 0  . benzonatate (TESSALON) 200 MG capsule Take 1 capsule (200 mg total) by mouth 2 (two) times daily. 10 capsule 0  . carvedilol (COREG) 6.25 MG tablet Take 1 tablet (6.25 mg total) by mouth 2 (two) times daily with a meal. 60 tablet 0  . chlorpheniramine-HYDROcodone (TUSSIONEX) 10-8 MG/5ML LQCR Take 5 mLs by mouth every 12 (twelve) hours. 115 mL 0  . collagenase (SANTYL) ointment Apply topically daily. 15 g 0  . feeding supplement, GLUCERNA SHAKE, (GLUCERNA SHAKE) LIQD Take 237 mLs by mouth 2 (two) times daily between meals. 60 Can 0  . ferrous sulfate 325 (65 FE) MG tablet Take 1 tablet (325 mg total) by mouth 2 (two) times daily with a meal. 30 tablet 0  . fluticasone (FLONASE) 50 MCG/ACT nasal spray Place 2 sprays into both nostrils daily. 9.9 g 0  . furosemide (LASIX) 80 MG tablet Take 1 tablet (80 mg total) by mouth 2 (two) times daily. 60 tablet 0  . gabapentin (NEURONTIN) 300 MG capsule Take 1 capsule (300 mg total) by mouth 3 (three) times daily. 90 capsule 0  .  glimepiride (AMARYL) 2 MG tablet Take 1 tablet (2 mg total) by mouth daily with breakfast. 30 tablet 0  . hydrALAZINE (APRESOLINE) 10 MG tablet Take 1 tablet (10 mg total) by mouth 3 (three) times daily. 90 tablet 0  . isosorbide mononitrate (IMDUR) 60 MG 24 hr tablet Take 1 tablet (60 mg total) by mouth daily. 30 tablet 0  . loratadine (CLARITIN) 10 MG tablet Take 1 tablet (10 mg total) by mouth daily. 30 tablet 0  . mometasone-formoterol (DULERA) 100-5 MCG/ACT AERO Inhale 2 puffs into the lungs 2 (two) times daily. 1 Inhaler 0  . nicotine (NICODERM CQ - DOSED IN MG/24 HOURS) 21 mg/24hr patch Place 1 patch (21 mg total) onto the skin daily. 28 patch 0  .  pantoprazole (PROTONIX) 40 MG tablet Take 1 tablet (40 mg total) by mouth daily. 30 tablet 0  . potassium chloride SA (K-DUR,KLOR-CON) 20 MEQ tablet Take 1 tablet (20 mEq total) by mouth daily. 30 tablet 0  . predniSONE (DELTASONE) 10 MG tablet Take 4 tablets (40 mg) daily for 2 days, then, Take 3 tablets (30 mg) daily for 2 days, then, Take 2 tablets (20 mg) daily for 2 days, then, Take 1 tablets (10 mg) daily for 1 days, then stop 19 tablet 0  . tiotropium (SPIRIVA HANDIHALER) 18 MCG inhalation capsule Place 1 capsule (18 mcg total) into inhaler and inhale daily. 30 capsule 0  . traMADol (ULTRAM) 50 MG tablet Take 1 tablet (50 mg total) by mouth every 6 (six) hours as needed for moderate pain (cough). 15 tablet 0   No Known Allergies   Exam:  BP 144/96 mmHg  Pulse 97  Temp(Src) 98.9 F (37.2 C) (Oral)  Resp 20  SpO2 97% Gen: Well NAD HEENT: EOMI,  MMM Lungs: Normal work of breathing. CTABL Heart: RRR no MRG Abd: NABS, Soft. Nondistended, Nontender Exts: Brisk capillary refill, warm and well perfused.  Left foot: Well-appearing ulcerations with no erythema. Skin is formed over top of wounds. Pulses and capillary refill are intact distally. Sensation is intact to light touch in the left foot.  No results found for this or any previous visit (from the past 24 hour(s)). No results found.  Assessment and Plan: 74 y.o. male with peripheral neuropathy. Treat with gabapentin. Follow-up with PCP for further evaluation and management.  Discussed warning signs or symptoms. Please see discharge instructions. Patient expresses understanding.     Rodolph BongEvan S Vannah Nadal, MD 03/17/15 1501

## 2015-03-26 ENCOUNTER — Other Ambulatory Visit (HOSPITAL_COMMUNITY): Payer: Self-pay

## 2015-03-26 ENCOUNTER — Inpatient Hospital Stay (HOSPITAL_COMMUNITY)
Admission: EM | Admit: 2015-03-26 | Discharge: 2015-04-01 | DRG: 190 | Disposition: A | Discharge status: Home / Self Care | Payer: Medicare (Managed Care) | Attending: Internal Medicine | Admitting: Internal Medicine

## 2015-03-26 ENCOUNTER — Encounter (HOSPITAL_COMMUNITY): Payer: Self-pay | Admitting: Emergency Medicine

## 2015-03-26 ENCOUNTER — Emergency Department (HOSPITAL_COMMUNITY): Payer: Medicare (Managed Care)

## 2015-03-26 DIAGNOSIS — R05 Cough: Secondary | ICD-10-CM | POA: Diagnosis present

## 2015-03-26 DIAGNOSIS — K219 Gastro-esophageal reflux disease without esophagitis: Secondary | ICD-10-CM | POA: Diagnosis present

## 2015-03-26 DIAGNOSIS — E1122 Type 2 diabetes mellitus with diabetic chronic kidney disease: Secondary | ICD-10-CM | POA: Diagnosis present

## 2015-03-26 DIAGNOSIS — E785 Hyperlipidemia, unspecified: Secondary | ICD-10-CM | POA: Diagnosis present

## 2015-03-26 DIAGNOSIS — E860 Dehydration: Secondary | ICD-10-CM | POA: Diagnosis present

## 2015-03-26 DIAGNOSIS — I129 Hypertensive chronic kidney disease with stage 1 through stage 4 chronic kidney disease, or unspecified chronic kidney disease: Secondary | ICD-10-CM | POA: Diagnosis present

## 2015-03-26 DIAGNOSIS — R7989 Other specified abnormal findings of blood chemistry: Secondary | ICD-10-CM | POA: Diagnosis not present

## 2015-03-26 DIAGNOSIS — G629 Polyneuropathy, unspecified: Secondary | ICD-10-CM

## 2015-03-26 DIAGNOSIS — I2510 Atherosclerotic heart disease of native coronary artery without angina pectoris: Secondary | ICD-10-CM | POA: Diagnosis present

## 2015-03-26 DIAGNOSIS — Z7951 Long term (current) use of inhaled steroids: Secondary | ICD-10-CM

## 2015-03-26 DIAGNOSIS — E1129 Type 2 diabetes mellitus with other diabetic kidney complication: Secondary | ICD-10-CM | POA: Diagnosis present

## 2015-03-26 DIAGNOSIS — E1165 Type 2 diabetes mellitus with hyperglycemia: Secondary | ICD-10-CM | POA: Diagnosis present

## 2015-03-26 DIAGNOSIS — Z7982 Long term (current) use of aspirin: Secondary | ICD-10-CM

## 2015-03-26 DIAGNOSIS — D509 Iron deficiency anemia, unspecified: Secondary | ICD-10-CM | POA: Diagnosis present

## 2015-03-26 DIAGNOSIS — J449 Chronic obstructive pulmonary disease, unspecified: Secondary | ICD-10-CM | POA: Diagnosis present

## 2015-03-26 DIAGNOSIS — N189 Chronic kidney disease, unspecified: Secondary | ICD-10-CM | POA: Diagnosis not present

## 2015-03-26 DIAGNOSIS — R778 Other specified abnormalities of plasma proteins: Secondary | ICD-10-CM | POA: Diagnosis present

## 2015-03-26 DIAGNOSIS — R053 Chronic cough: Secondary | ICD-10-CM | POA: Diagnosis present

## 2015-03-26 DIAGNOSIS — I5042 Chronic combined systolic (congestive) and diastolic (congestive) heart failure: Secondary | ICD-10-CM | POA: Diagnosis present

## 2015-03-26 DIAGNOSIS — I10 Essential (primary) hypertension: Secondary | ICD-10-CM | POA: Diagnosis present

## 2015-03-26 DIAGNOSIS — N183 Chronic kidney disease, stage 3 (moderate): Secondary | ICD-10-CM | POA: Diagnosis present

## 2015-03-26 DIAGNOSIS — J9601 Acute respiratory failure with hypoxia: Secondary | ICD-10-CM | POA: Diagnosis present

## 2015-03-26 DIAGNOSIS — J440 Chronic obstructive pulmonary disease with acute lower respiratory infection: Secondary | ICD-10-CM | POA: Diagnosis not present

## 2015-03-26 DIAGNOSIS — Z9119 Patient's noncompliance with other medical treatment and regimen: Secondary | ICD-10-CM | POA: Diagnosis present

## 2015-03-26 DIAGNOSIS — R0602 Shortness of breath: Secondary | ICD-10-CM

## 2015-03-26 DIAGNOSIS — Z8673 Personal history of transient ischemic attack (TIA), and cerebral infarction without residual deficits: Secondary | ICD-10-CM | POA: Diagnosis not present

## 2015-03-26 DIAGNOSIS — F17210 Nicotine dependence, cigarettes, uncomplicated: Secondary | ICD-10-CM | POA: Diagnosis present

## 2015-03-26 DIAGNOSIS — J441 Chronic obstructive pulmonary disease with (acute) exacerbation: Secondary | ICD-10-CM | POA: Diagnosis present

## 2015-03-26 DIAGNOSIS — I5021 Acute systolic (congestive) heart failure: Secondary | ICD-10-CM

## 2015-03-26 DIAGNOSIS — Z66 Do not resuscitate: Secondary | ICD-10-CM | POA: Diagnosis present

## 2015-03-26 DIAGNOSIS — IMO0002 Reserved for concepts with insufficient information to code with codable children: Secondary | ICD-10-CM | POA: Diagnosis present

## 2015-03-26 HISTORY — DX: Chronic obstructive pulmonary disease, unspecified: J44.9

## 2015-03-26 LAB — I-STAT CG4 LACTIC ACID, ED: Lactic Acid, Venous: 2.42 mmol/L (ref 0.5–2.0)

## 2015-03-26 LAB — CBC
HCT: 40.6 % (ref 39.0–52.0)
HEMOGLOBIN: 13.2 g/dL (ref 13.0–17.0)
MCH: 29.5 pg (ref 26.0–34.0)
MCHC: 32.5 g/dL (ref 30.0–36.0)
MCV: 90.8 fL (ref 78.0–100.0)
PLATELETS: 191 10*3/uL (ref 150–400)
RBC: 4.47 MIL/uL (ref 4.22–5.81)
RDW: 16.3 % — ABNORMAL HIGH (ref 11.5–15.5)
WBC: 6.3 10*3/uL (ref 4.0–10.5)

## 2015-03-26 LAB — BASIC METABOLIC PANEL
ANION GAP: 10 (ref 5–15)
BUN: 10 mg/dL (ref 6–20)
CHLORIDE: 102 mmol/L (ref 101–111)
CO2: 26 mmol/L (ref 22–32)
CREATININE: 1.23 mg/dL (ref 0.61–1.24)
Calcium: 8.8 mg/dL — ABNORMAL LOW (ref 8.9–10.3)
GFR calc Af Amer: >60 mL/min (ref >60)
GFR calc non Af Amer: 57 mL/min — ABNORMAL LOW (ref >60)
GLUCOSE: 197 mg/dL — AB (ref 65–99)
Potassium: 3.9 mmol/L (ref 3.5–5.1)
SODIUM: 138 mmol/L (ref 135–145)

## 2015-03-26 LAB — I-STAT TROPONIN, ED: Troponin i, poc: 0.1 ng/mL (ref 0.00–0.08)

## 2015-03-26 LAB — TROPONIN I: TROPONIN I: 0.06 ng/mL — AB (ref <0.031)

## 2015-03-26 LAB — BRAIN NATRIURETIC PEPTIDE: B Natriuretic Peptide: 1712.3 pg/mL — ABNORMAL HIGH (ref 0.0–100.0)

## 2015-03-26 LAB — GLUCOSE, CAPILLARY
GLUCOSE-CAPILLARY: 206 mg/dL — AB (ref 65–99)
Glucose-Capillary: 356 mg/dL — ABNORMAL HIGH (ref 65–99)

## 2015-03-26 MED ORDER — FUROSEMIDE 10 MG/ML IJ SOLN
40.0000 mg | Freq: Once | INTRAMUSCULAR | Status: AC
  Administered 2015-03-26: 40 mg via INTRAVENOUS
  Filled 2015-03-26: qty 4

## 2015-03-26 MED ORDER — ARFORMOTEROL TARTRATE 15 MCG/2ML IN NEBU
15.0000 ug | INHALATION_SOLUTION | Freq: Two times a day (BID) | RESPIRATORY_TRACT | Status: DC
  Administered 2015-03-26 – 2015-04-01 (×12): 15 ug via RESPIRATORY_TRACT
  Filled 2015-03-26 (×16): qty 2

## 2015-03-26 MED ORDER — ACETAMINOPHEN 325 MG PO TABS: 650.0000 mg | ORAL_TABLET | Freq: Four times a day (QID) | ORAL | Status: DC | PRN

## 2015-03-26 MED ORDER — IPRATROPIUM-ALBUTEROL 0.5-2.5 (3) MG/3ML IN SOLN
3.0000 mL | Freq: Four times a day (QID) | RESPIRATORY_TRACT | Status: DC
  Administered 2015-03-26 – 2015-03-27 (×3): 3 mL via RESPIRATORY_TRACT
  Filled 2015-03-26 (×3): qty 3

## 2015-03-26 MED ORDER — ASPIRIN 81 MG PO CHEW
81.0000 mg | CHEWABLE_TABLET | Freq: Every day | ORAL | Status: DC
  Administered 2015-03-27 – 2015-04-01 (×6): 81 mg via ORAL
  Filled 2015-03-26 (×5): qty 1

## 2015-03-26 MED ORDER — ASPIRIN 325 MG PO TABS
325.0000 mg | ORAL_TABLET | Freq: Once | ORAL | Status: AC
  Administered 2015-03-26: 325 mg via ORAL
  Filled 2015-03-26: qty 1

## 2015-03-26 MED ORDER — TRAMADOL HCL 50 MG PO TABS: 50.0000 mg | ORAL_TABLET | Freq: Four times a day (QID) | ORAL | Status: DC | PRN

## 2015-03-26 MED ORDER — GLUCERNA SHAKE PO LIQD
237.0000 mL | Freq: Two times a day (BID) | ORAL | Status: DC
  Administered 2015-03-27 – 2015-04-01 (×12): 237 mL via ORAL

## 2015-03-26 MED ORDER — NICOTINE 21 MG/24HR TD PT24
21.0000 mg | MEDICATED_PATCH | Freq: Every day | TRANSDERMAL | Status: DC
  Administered 2015-03-31: 21 mg via TRANSDERMAL
  Filled 2015-03-26 (×7): qty 1

## 2015-03-26 MED ORDER — FLUTICASONE PROPIONATE 50 MCG/ACT NA SUSP
2.0000 | Freq: Every day | NASAL | Status: DC
  Administered 2015-03-29 – 2015-03-31 (×2): 2 via NASAL
  Filled 2015-03-26: qty 16

## 2015-03-26 MED ORDER — INSULIN ASPART 100 UNIT/ML ~~LOC~~ SOLN
0.0000 [IU] | Freq: Three times a day (TID) | SUBCUTANEOUS | Status: DC
  Administered 2015-03-27 – 2015-03-28 (×4): 9 [IU] via SUBCUTANEOUS
  Administered 2015-03-28 – 2015-03-29 (×2): 5 [IU] via SUBCUTANEOUS
  Administered 2015-03-29: 7 [IU] via SUBCUTANEOUS
  Administered 2015-03-29: 5 [IU] via SUBCUTANEOUS
  Administered 2015-03-30: 3 [IU] via SUBCUTANEOUS
  Administered 2015-03-30: 2 [IU] via SUBCUTANEOUS
  Administered 2015-03-30 – 2015-03-31 (×2): 5 [IU] via SUBCUTANEOUS
  Administered 2015-03-31: 3 [IU] via SUBCUTANEOUS

## 2015-03-26 MED ORDER — CARVEDILOL 6.25 MG PO TABS
6.2500 mg | ORAL_TABLET | Freq: Two times a day (BID) | ORAL | Status: DC
  Administered 2015-03-27 – 2015-04-01 (×11): 6.25 mg via ORAL
  Filled 2015-03-26 (×13): qty 1

## 2015-03-26 MED ORDER — INSULIN ASPART 100 UNIT/ML ~~LOC~~ SOLN
0.0000 [IU] | Freq: Every day | SUBCUTANEOUS | Status: DC
  Administered 2015-03-28 – 2015-03-31 (×4): 3 [IU] via SUBCUTANEOUS

## 2015-03-26 MED ORDER — ISOSORBIDE MONONITRATE ER 60 MG PO TB24
60.0000 mg | ORAL_TABLET | Freq: Every day | ORAL | Status: DC
  Administered 2015-03-27 – 2015-04-01 (×6): 60 mg via ORAL
  Filled 2015-03-26 (×7): qty 1

## 2015-03-26 MED ORDER — ACETAMINOPHEN 650 MG RE SUPP: 650.0000 mg | Freq: Four times a day (QID) | RECTAL | Status: DC | PRN

## 2015-03-26 MED ORDER — FERROUS SULFATE 325 (65 FE) MG PO TABS
325.0000 mg | ORAL_TABLET | Freq: Two times a day (BID) | ORAL | Status: DC
  Administered 2015-03-27 – 2015-04-01 (×11): 325 mg via ORAL
  Filled 2015-03-26 (×13): qty 1

## 2015-03-26 MED ORDER — IPRATROPIUM-ALBUTEROL 0.5-2.5 (3) MG/3ML IN SOLN
3.0000 mL | Freq: Once | RESPIRATORY_TRACT | Status: AC
  Administered 2015-03-26: 3 mL via RESPIRATORY_TRACT
  Filled 2015-03-26: qty 3

## 2015-03-26 MED ORDER — LEVOFLOXACIN 750 MG PO TABS
750.0000 mg | ORAL_TABLET | Freq: Every day | ORAL | Status: DC
  Administered 2015-03-26: 750 mg via ORAL
  Filled 2015-03-26 (×2): qty 1

## 2015-03-26 MED ORDER — PANTOPRAZOLE SODIUM 40 MG PO TBEC
40.0000 mg | DELAYED_RELEASE_TABLET | Freq: Every day | ORAL | Status: DC
  Administered 2015-03-27 – 2015-04-01 (×6): 40 mg via ORAL
  Filled 2015-03-26 (×3): qty 1

## 2015-03-26 MED ORDER — BENZONATATE 100 MG PO CAPS
200.0000 mg | ORAL_CAPSULE | Freq: Two times a day (BID) | ORAL | Status: DC
  Administered 2015-03-26 – 2015-04-01 (×12): 200 mg via ORAL
  Filled 2015-03-26 (×13): qty 2

## 2015-03-26 MED ORDER — ONDANSETRON HCL 4 MG/2ML IJ SOLN: 4.0000 mg | Freq: Four times a day (QID) | INTRAMUSCULAR | Status: DC | PRN

## 2015-03-26 MED ORDER — ONDANSETRON HCL 4 MG PO TABS: 4.0000 mg | ORAL_TABLET | Freq: Four times a day (QID) | ORAL | Status: DC | PRN

## 2015-03-26 MED ORDER — HYDROCOD POLST-CPM POLST ER 10-8 MG/5ML PO SUER
5.0000 mL | Freq: Two times a day (BID) | ORAL | Status: DC
  Administered 2015-03-26 – 2015-03-31 (×11): 5 mL via ORAL
  Filled 2015-03-26 (×23): qty 5

## 2015-03-26 MED ORDER — ALBUTEROL SULFATE (2.5 MG/3ML) 0.083% IN NEBU: 2.5000 mg | INHALATION_SOLUTION | RESPIRATORY_TRACT | Status: DC | PRN

## 2015-03-26 MED ORDER — BUDESONIDE 0.25 MG/2ML IN SUSP
0.2500 mg | Freq: Two times a day (BID) | RESPIRATORY_TRACT | Status: DC
  Administered 2015-03-26 – 2015-04-01 (×12): 0.25 mg via RESPIRATORY_TRACT
  Filled 2015-03-26 (×15): qty 2

## 2015-03-26 MED ORDER — HYDRALAZINE HCL 10 MG PO TABS
10.0000 mg | ORAL_TABLET | Freq: Three times a day (TID) | ORAL | Status: DC
  Administered 2015-03-26 – 2015-04-01 (×17): 10 mg via ORAL
  Filled 2015-03-26 (×20): qty 1

## 2015-03-26 MED ORDER — SODIUM CHLORIDE 0.9 % IV SOLN
INTRAVENOUS | Status: DC
  Administered 2015-03-26: 22:00:00 via INTRAVENOUS
  Administered 2015-03-27: 1000 mL via INTRAVENOUS
  Administered 2015-03-27: 1 mL via INTRAVENOUS
  Administered 2015-03-28: 12:00:00 via INTRAVENOUS

## 2015-03-26 MED ORDER — METHYLPREDNISOLONE SODIUM SUCC 125 MG IJ SOLR
125.0000 mg | Freq: Once | INTRAMUSCULAR | Status: AC
  Administered 2015-03-26: 125 mg via INTRAVENOUS
  Filled 2015-03-26: qty 2

## 2015-03-26 MED ORDER — METHYLPREDNISOLONE SODIUM SUCC 125 MG IJ SOLR
60.0000 mg | Freq: Two times a day (BID) | INTRAMUSCULAR | Status: DC
  Administered 2015-03-26 – 2015-03-29 (×7): 60 mg via INTRAVENOUS
  Filled 2015-03-26 (×7): qty 0.96

## 2015-03-26 MED ORDER — GABAPENTIN 300 MG PO CAPS
300.0000 mg | ORAL_CAPSULE | Freq: Three times a day (TID) | ORAL | Status: DC
  Administered 2015-03-26 – 2015-04-01 (×17): 300 mg via ORAL
  Filled 2015-03-26 (×20): qty 1

## 2015-03-26 MED ORDER — ATORVASTATIN CALCIUM 80 MG PO TABS
80.0000 mg | ORAL_TABLET | Freq: Every day | ORAL | Status: DC
  Administered 2015-03-27 – 2015-03-31 (×5): 80 mg via ORAL
  Filled 2015-03-26 (×7): qty 1

## 2015-03-26 MED ORDER — SODIUM CHLORIDE 0.9 % IJ SOLN
3.0000 mL | Freq: Two times a day (BID) | INTRAMUSCULAR | Status: DC
  Administered 2015-03-26 – 2015-04-01 (×7): 3 mL via INTRAVENOUS

## 2015-03-26 MED ORDER — COLLAGENASE 250 UNIT/GM EX OINT
TOPICAL_OINTMENT | Freq: Every day | CUTANEOUS | Status: DC
  Administered 2015-03-27 – 2015-03-31 (×5): via TOPICAL
  Filled 2015-03-26 (×2): qty 30

## 2015-03-26 MED ORDER — LORATADINE 10 MG PO TABS
10.0000 mg | ORAL_TABLET | Freq: Every day | ORAL | Status: DC
  Administered 2015-03-27 – 2015-04-01 (×6): 10 mg via ORAL
  Filled 2015-03-26 (×7): qty 1

## 2015-03-26 NOTE — ED Notes (Signed)
Per EMS: pt from home for eval of increase in sob since 03/25/15 pm, pt states also a cough x3 months. Pt denies any pain at this time, EMS noted upon arrival pt oxygen sats 90-92 on room air and improved to 95 on 2L nasal cannula. Pt denies any new swelling, does have hx of CHF.

## 2015-03-26 NOTE — H&P (Signed)
[facility] History and Physical  Russell Hamilton ZOX:[id number] DOB: Feb 19, 1941 DOA: 03/26/2015  Referring physician:  Chaney Malling PCP:  Jaclyn Shaggy, MD   Chief Complaint:  SOB  HPI:  The patient is a 74 y.o. year-old male with history of chronic systolic and diastolic heart failure, hypertension, diabetes mellitus type 2, CAD based on a CAT scan however the patient has repeatedly previously declined cardiac catheterization despite recurrent chest pains a mildly elevated troponins on previous admissions, limited medical insight, medical noncompliance, lack of insurance.  He presents again with shortness of breath.  The patient was last admitted from 02/24/2015 through 03/01/2015. He was treated for acute on chronic systolic and diastolic heart failure and COPD exacerbation. The patient states that he still had some mild cough when he was discharged from the hospital however he was able to fill a couple of his prescriptions which he states he took for approximately one month. He later states that he did not get any of his prescriptions filled because they were too expensive when he took them to the pharmacy. He had [facility] most of [facility] prescriptions if not all of them should have been free, however he states he was charged $100 and therefore did not pick them up. He cannot tell me most of the names of his medications. He states that he continued to have cough at home but his lower extremity swelling continued to decrease as did the pain in his feet. Over the last week he has had increased dyspnea on exertion, cough productive of thick clear to mildly yellow sputum. His cough is frequent and severe and he has had frequent posttussive emesis. Nonbloody, nonbilious emesis. He denies fevers, chills, PND, orthopnea. He continues to smoke approximately half a pack per day.  In the emergency room and, he is afebrile, heart rate in the  low 100s, respirations 16 to the mid 30s with very frequent cough, blood pressure mildly elevated. All of his reported oxygen saturations have been normal however at the time of my exam he is wearing 2 L nasal cannula. Labs were normal except for a glucose of 197. His chest x-ray demonstrated patchy bilateral lobe opacities concerning for atelectasis versus pneumonia and possible small bilateral pleural effusions.  BNP is proximally stable from previous admission at 1712.  His troponin is 0.1, however he continues to decline cardiac catheterization therefore cardiology was not notified.  He was given DuoNeb, soluble Medrol, Lasix, and a full dose aspirin and is being admitted for presumptive acute COPD exacerbation with possible acute on chronic systolic heart failure. The emergency department physician felt that his chest x-ray and symptoms were less concerning for pneumonia.  Review of Systems:  General:  Denies fevers, chills, weight loss or gain HEENT:  Denies changes to hearing and vision, rhinorrhea, sinus congestion, sore throat CV:  Denies chest pain and palpitations, improving lower extremity edema.  PULM:  Positive SOB, wheezing, and cough.   GI:  Positive nausea, vomiting, post cough, had intermittent mixed watery plus solid formed stools about 2-3 days ago GU:  Denies dysuria, frequency, urgency ENDO:  Denies polyuria, polydipsia.   HEME:  Denies hematemesis, blood in stools, melena, abnormal bruising or bleeding.  LYMPH:  Denies lymphadenopathy.   MSK:  Chronic but improving bilateral foot arthralgias, myalgias.   DERM:  Denies skin rash or ulcer.   NEURO:  Denies focal numbness, weakness, slurred speech, confusion, facial droop.  PSYCH:  Denies anxiety and depression.    Past Medical History  Diagnosis Date  . Hypertension   . Chronic systolic CHF (congestive heart failure)     a. 2D ECHO 08/13/14: EF 40%, diffuse hypokinesis possibly worse in the inferior wall. Mild MR, mild LA  dilation, PA pressure 50. Trivial pericardial effusion.  . Acute systolic CHF (congestive heart failure), NYHA class 4 08/12/2014  . Type II diabetes mellitus   . GERD (gastroesophageal reflux disease)   . Peripheral neuropathy     [name] 08/12/2014  . Stroke 2010    "memory problems since; right leg and left arm weakness since" (02/08/2015)  . Ulcers of both lower extremities     [name] 02/24/2015  . Noncompliance     [name] 02/24/2015  . COPD (chronic obstructive pulmonary disease)    Past Surgical History  Procedure Laterality Date  . Carotid endarterectomy Right 2010  . Hand ligament reconstruction Right ?1970's    "pinky"   Social History:  reports that he has been smoking Cigarettes.  He has a 42 pack-year smoking history. He has never used smokeless tobacco. He reports that he drinks alcohol. He reports that he does not use illicit drugs.  No Known Allergies  Family History  Problem Relation Age of Onset  . Kidney disease Mother      Prior to Admission medications   Medication Sig Start Date End Date Taking? Authorizing Provider  albuterol (PROVENTIL HFA;VENTOLIN HFA) 108 (90 BASE) MCG/ACT inhaler Inhale 2 puffs into the lungs every 6 (six) hours as needed for wheezing or shortness of breath. 02/10/15  Yes Shanker Levora Dredge, MD  aspirin 81 MG chewable tablet Chew 1 tablet (81 mg total) by mouth daily. 02/10/15  Yes Shanker Levora Dredge, MD  benzonatate (TESSALON) 200 MG capsule Take 1 capsule (200 mg total) by mouth 2 (two) times daily. 02/10/15  Yes Shanker Levora Dredge, MD  furosemide (LASIX) 40 MG tablet Take 40 mg by mouth daily. 02/10/15  Yes Historical Provider, MD  gabapentin (NEURONTIN) 300 MG capsule Take 1 capsule (300 mg total) by mouth 3 (three) times daily. 03/17/15  Yes Rodolph Bong, MD  isosorbide mononitrate (IMDUR) 60 MG 24 hr tablet Take 1 tablet (60 mg total) by mouth daily. 02/28/15  Yes Renae Fickle, MD  atorvastatin (LIPITOR) 80 MG tablet Take 1 tablet (80 mg total) by  mouth daily at 6 PM. 02/28/15   Renae Fickle, MD  azithromycin (ZITHROMAX) 500 MG tablet Take 500 mg by mouth daily. 02/10/15   Historical Provider, MD  carvedilol (COREG) 6.25 MG tablet Take 1 tablet (6.25 mg total) by mouth 2 (two) times daily with a meal. 02/10/15   Maretta Bees, MD  chlorpheniramine-HYDROcodone (TUSSIONEX) 10-8 MG/5ML LQCR Take 5 mLs by mouth every 12 (twelve) hours. 02/10/15   Shanker Levora Dredge, MD  collagenase (SANTYL) ointment Apply topically daily. 02/10/15   Shanker Levora Dredge, MD  feeding supplement, GLUCERNA SHAKE, (GLUCERNA SHAKE) LIQD Take 237 mLs by mouth 2 (two) times daily between meals. 02/28/15   Renae Fickle, MD  ferrous sulfate 325 (65 FE) MG tablet Take 1 tablet (325 mg total) by mouth 2 (two) times daily with a meal. 02/28/15   Renae Fickle, MD  fluticasone (FLONASE) 50 MCG/ACT nasal spray Place 2 sprays into both nostrils daily. 02/10/15   Shanker Levora Dredge, MD  furosemide (LASIX) 80 MG tablet Take 1 tablet (80 mg total) by mouth 2 (two) times daily. Patient not taking: Reported on  03/26/2015 03/01/15   Renae FickleMackenzie Tametra Ahart, MD  glimepiride (AMARYL) 2 MG tablet Take 1 tablet (2 mg total) by mouth daily with breakfast. 02/10/15   Maretta BeesShanker M Ghimire, MD  hydrALAZINE (APRESOLINE) 10 MG tablet Take 1 tablet (10 mg total) by mouth 3 (three) times daily. 02/28/15   Renae FickleMackenzie Brecklynn Jian, MD  loratadine (CLARITIN) 10 MG tablet Take 1 tablet (10 mg total) by mouth daily. 02/10/15   Shanker Levora DredgeM Ghimire, MD  mometasone-formoterol (DULERA) 100-5 MCG/ACT AERO Inhale 2 puffs into the lungs 2 (two) times daily. 02/28/15   Renae FickleMackenzie Demiah Gullickson, MD  nicotine (NICODERM CQ - DOSED IN MG/24 HOURS) 21 mg/24hr patch Place 1 patch (21 mg total) onto the skin daily. 02/10/15   Shanker Levora DredgeM Ghimire, MD  pantoprazole (PROTONIX) 40 MG tablet Take 1 tablet (40 mg total) by mouth daily. 02/10/15   Maretta BeesShanker M Ghimire, MD  Potassium Chloride ER 20 MEQ TBCR  03/02/15   Historical Provider, MD  potassium chloride SA  (K-DUR,KLOR-CON) 20 MEQ tablet Take 1 tablet (20 mEq total) by mouth daily. 03/01/15   Renae FickleMackenzie Rinda Rollyson, MD  predniSONE (DELTASONE) 10 MG tablet Take 4 tablets (40 mg) daily for 2 days, then, Take 3 tablets (30 mg) daily for 2 days, then, Take 2 tablets (20 mg) daily for 2 days, then, Take 1 tablets (10 mg) daily for 1 days, then stop 03/01/15   Renae FickleMackenzie Lakia Gritton, MD  tiotropium (SPIRIVA HANDIHALER) 18 MCG inhalation capsule Place 1 capsule (18 mcg total) into inhaler and inhale daily. 03/01/15   Renae FickleMackenzie Logan Vegh, MD  traMADol (ULTRAM) 50 MG tablet Take 1 tablet (50 mg total) by mouth every 6 (six) hours as needed for moderate pain (cough). 02/10/15   Shanker Levora DredgeM Ghimire, MD   Physical Exam: Filed Vitals:   03/26/15 1530 03/26/15 1600 03/26/15 1615 03/26/15 1732  BP: 148/98 145/92 148/91   Pulse: 109 105 104   Temp:      TempSrc:      Resp: 24 34 33   Weight:      SpO2: 92% 95% 96% 95%     General:  Thin male, no acute distress, frequent coughing that interferes with his ability to speak or perform deep breaths for respiratory exam  Eyes:  PERRL, anicteric, non-injected.  ENT:  Nares clear.  OP clear, non-erythematous without plaques or exudates.  MMM.  Neck:  Supple without TM or JVD.    Lymph:  No cervical, supraclavicular, or submandibular LAD.  Cardiovascular: Mild tachycardia, regular rhythm, normal S1, S2, 2/6 systolic murmur at the left sternal border.  2+ pulses, warm extremities  Respiratory:   Diminished bilateral breath sounds throughout with high-pitched full expiratory wheeze, positive rhonchi, faint rales at the bilateral bases  Abdomen:  NABS.  Soft, ND/NT.    Skin:  No rashes, healing ulcers on the lateral aspects of both of his feet which are chronic, no evidence of induration, erythema, odor, or discharge  Musculoskeletal:  Normal bulk and tone.  No LE edema.  Feet are both very tender to palpation  Psychiatric:  A & O x 4.  Appropriate affect.  Neurologic:  CN 3-12  intact.  5/5 strength.  Sensation intact.  Labs on Admission:  Basic Metabolic Panel:  Recent Labs Lab 03/26/15 1518  NA 138  K 3.9  CL 102  CO2 26  GLUCOSE 197*  BUN 10  CREATININE 1.23  CALCIUM 8.8*   Liver Function Tests: No results for input(s): AST, ALT, ALKPHOS, BILITOT, PROT, ALBUMIN in the  last 168 hours. No results for input(s): LIPASE, AMYLASE in the last 168 hours. No results for input(s): AMMONIA in the last 168 hours. CBC:  Recent Labs Lab 03/26/15 1518  WBC 6.3  HGB 13.2  HCT 40.6  MCV 90.8  PLT 191   Cardiac Enzymes: No results for input(s): CKTOTAL, CKMB, CKMBINDEX, TROPONINI in the last 168 hours.  BNP (last 3 results)  Recent Labs  02/08/15 1502 02/24/15 1911 03/26/15 1516  BNP 1355.4* 1739.5* 1712.3*    ProBNP (last 3 results)  Recent Labs  08/12/14 1756  PROBNP 7427.0*    CBG: No results for input(s): GLUCAP in the last 168 hours.  Radiological Exams on Admission: Dg Chest Port 1 View  03/26/2015   CLINICAL DATA:  Shortness of breath  EXAM: PORTABLE CHEST - 1 VIEW  COMPARISON:  02/27/2015  FINDINGS: Low lung volumes.  Patchy bilateral lower lobe opacities, atelectasis versus pneumonia. Possible small bilateral pleural effusions.  No pneumothorax.  Cardiomegaly.  IMPRESSION: Patchy bilateral lower lobe opacities, atelectasis versus pneumonia.  Possible small bilateral pleural effusions.   Electronically Signed   By: Charline Bills M.D.   On: 03/26/2015 15:24    EKG: Normal sinus rhythm with suggestion of septal infarct, chronic T-wave inversions in V5 6 with flattened T waves in the lateral leads and mild ST segment and depressions in inferior leads which are chronic  Assessment/Plan Principal Problem:   COPD with acute exacerbation Active Problems:   HTN (hypertension)   Peripheral neuropathy   Type II diabetes mellitus with renal manifestations, uncontrolled   Elevated troponin   Cough, persistent   Type 2 diabetes  mellitus with diabetic chronic kidney disease   COPD (chronic obstructive pulmonary disease)  ---  Acute COPD exacerbation, noncompliant with dulera and spiriva, now with post-tussive emesis. -  Solumedrol  -  Budesonide and brovana BID -  Duoneb q4h with albuterol q2h prn -  Levofloxacin  -  Start florastor -  Wean oxygen as tolerated  Chronic systolic and diastolic CHF  ECHO last done on 02/27/2015 which demonstrated ejection fraction of 30-35% with diffuse hypokinesis, grade 1 diastolic dysfunction.  Refer to official report for details -  Telemetry -  Daily weights and strict I/O -  TSH 1.162 on 4/23 -  No ACEI or spironolactone secondary to CK D -  Patient has been taking his Imdur that has been noncompliant with his hydralazine -  Continue beta blocker -  Hold lasix due to elevated lactic acid -  2 gm sodium diet with 1.2L fluid restriction -  Wean oxygen as tolerated -  Nursing staff to provide education about CHF  CAD but patient not interested in cath but elevated troponin/possible NSTEMI -  Cycle troponins -  Continue daily aspirin -  Resume high dose statin and BB -  Lipid panel and A1c just done last month and patient noncompliant with medications  HTN, blood pressure elevated -  Resume hydralazine and BB  Chronic kidney disease stage 3, baseline creatinine 1.3 due to diabetes and high blood pressure -  Minimize nephrotoxins and renally dose medications  Diabetes mellitus type 2 with renal manifestations -  A1c  9.3 on 02/26/2015 -  He has not been taking his oral medications -  SSI and HS insulin  Iron deficiency anemia, hemoglobin at baseline of 12-13 - Iron studies consistent with iron deficiency, B12 773 , folate 8.3, TSH 1.162 - Started ferrous sulfate - Recommend follow-up with gastroenterology  Tobacco abuse with  withdrawal -  Offered nicotine patch -  Counseled cessation  Noncompliance with appointments and lack of insurance, very limited and  poor insight into medical conditions.  -  He needs his medications handed to him at discharge and he needs as FEW medications as possible - Social work and case management following - Education with teach back about insurance, medications, and health problems  Diet: Diabetic, low sodium Access: PIV IVF:  yes Proph: scds (declines heparin)  Code Status: DNR Family Communication: patient alone Disposition Plan: telemetry  Time spent: 60 min Renae Fickle [facility] Pager [phone]  If 7PM-7AM, please contact night-coverage [email or web address] Password [facility] 03/26/2015, 5:40 PM

## 2015-03-26 NOTE — ED Notes (Signed)
Dr Silverio LayYao given a copy of troponin results .10

## 2015-03-26 NOTE — ED Notes (Signed)
Dr Silverio LayYao given a copy of lactic acid results 2.42

## 2015-03-26 NOTE — ED Provider Notes (Signed)
CSN: [id number]     Arrival date & time 03/26/15  1444 History   First MD Initiated Contact with Patient 03/26/15 1445     Chief Complaint  Patient presents with  . Shortness of Breath     (Consider location/radiation/quality/duration/timing/severity/associated sxs/prior Treatment) The history is provided by the patient.  Russell Hamilton is a 74 y.o. male history of hypertension, CHF (EF 30% 2 weeks ago), diabetes, neuropathy here presenting with shortness of breath. Shortness of breath for several months. Recently admitted for hypoxic respiratory failure secondary to COPD, CHF. Echo at that time showed EF of 30%. States that he has not seen his doctor since discharge. Has been worsening shortness of breath since then with some cough with clear sputum. He still smokes and has a history of COPD. O2 was 90% RA as per EMS and is not on oxygen at home.    Past Medical History  Diagnosis Date  . Hypertension   . Chronic systolic CHF (congestive heart failure)     a. 2D ECHO 08/13/14: EF 40%, diffuse hypokinesis possibly worse in the inferior wall. Mild MR, mild LA dilation, PA pressure 50. Trivial pericardial effusion.  . Acute systolic CHF (congestive heart failure), NYHA class 4 08/12/2014  . Type II diabetes mellitus   . GERD (gastroesophageal reflux disease)   . Peripheral neuropathy     [name] 08/12/2014  . Stroke 2010    "memory problems since; right leg and left arm weakness since" (02/08/2015)  . Ulcers of both lower extremities     [name] 02/24/2015  . Noncompliance     [name] 02/24/2015   Past Surgical History  Procedure Laterality Date  . Carotid endarterectomy Right 2010  . Hand ligament reconstruction Right ?1970's    "pinky"   Family History  Problem Relation Age of Onset  . Kidney disease Mother    History  Substance Use Topics  . Smoking status: Current Every Day Smoker -- 0.75 packs/day for 56 years    Types: Cigarettes  . Smokeless tobacco: Never Used  . Alcohol Use: Yes      Comment: 02/08/2015 "I might have a beer q 6 months"    Review of Systems  Respiratory: Positive for shortness of breath.   All other systems reviewed and are negative.     Allergies  Review of patient's allergies indicates no known allergies.  Home Medications   Prior to Admission medications   Medication Sig Start Date End Date Taking? Authorizing Provider  albuterol (PROVENTIL HFA;VENTOLIN HFA) 108 (90 BASE) MCG/ACT inhaler Inhale 2 puffs into the lungs every 6 (six) hours as needed for wheezing or shortness of breath. 02/10/15   Shanker Levora Dredge, MD  aspirin 81 MG chewable tablet Chew 1 tablet (81 mg total) by mouth daily. 02/10/15   Shanker Levora Dredge, MD  atorvastatin (LIPITOR) 80 MG tablet Take 1 tablet (80 mg total) by mouth daily at 6 PM. 02/28/15   Renae Fickle, MD  benzonatate (TESSALON) 200 MG capsule Take 1 capsule (200 mg total) by mouth 2 (two) times daily. 02/10/15   Shanker Levora Dredge, MD  carvedilol (COREG) 6.25 MG tablet Take 1 tablet (6.25 mg total) by mouth 2 (two) times daily with a meal. 02/10/15   Shanker Levora Dredge, MD  chlorpheniramine-HYDROcodone (TUSSIONEX) 10-8 MG/5ML LQCR Take 5 mLs by mouth every 12 (twelve) hours. 02/10/15   Shanker Levora Dredge, MD  collagenase (SANTYL) ointment Apply topically daily. 02/10/15   Shanker Levora Dredge, MD  feeding supplement, GLUCERNA  SHAKE, (GLUCERNA SHAKE) LIQD Take 237 mLs by mouth 2 (two) times daily between meals. 02/28/15   Renae Fickle, MD  ferrous sulfate 325 (65 FE) MG tablet Take 1 tablet (325 mg total) by mouth 2 (two) times daily with a meal. 02/28/15   Renae Fickle, MD  fluticasone (FLONASE) 50 MCG/ACT nasal spray Place 2 sprays into both nostrils daily. 02/10/15   Shanker Levora Dredge, MD  furosemide (LASIX) 80 MG tablet Take 1 tablet (80 mg total) by mouth 2 (two) times daily. 03/01/15   Renae Fickle, MD  gabapentin (NEURONTIN) 300 MG capsule Take 1 capsule (300 mg total) by mouth 3 (three) times daily. 03/17/15   Rodolph Bong, MD  glimepiride (AMARYL) 2 MG tablet Take 1 tablet (2 mg total) by mouth daily with breakfast. 02/10/15   Maretta Bees, MD  hydrALAZINE (APRESOLINE) 10 MG tablet Take 1 tablet (10 mg total) by mouth 3 (three) times daily. 02/28/15   Renae Fickle, MD  isosorbide mononitrate (IMDUR) 60 MG 24 hr tablet Take 1 tablet (60 mg total) by mouth daily. 02/28/15   Renae Fickle, MD  loratadine (CLARITIN) 10 MG tablet Take 1 tablet (10 mg total) by mouth daily. 02/10/15   Shanker Levora Dredge, MD  mometasone-formoterol (DULERA) 100-5 MCG/ACT AERO Inhale 2 puffs into the lungs 2 (two) times daily. 02/28/15   Renae Fickle, MD  nicotine (NICODERM CQ - DOSED IN MG/24 HOURS) 21 mg/24hr patch Place 1 patch (21 mg total) onto the skin daily. 02/10/15   Shanker Levora Dredge, MD  pantoprazole (PROTONIX) 40 MG tablet Take 1 tablet (40 mg total) by mouth daily. 02/10/15   Shanker Levora Dredge, MD  potassium chloride SA (K-DUR,KLOR-CON) 20 MEQ tablet Take 1 tablet (20 mEq total) by mouth daily. 03/01/15   Renae Fickle, MD  predniSONE (DELTASONE) 10 MG tablet Take 4 tablets (40 mg) daily for 2 days, then, Take 3 tablets (30 mg) daily for 2 days, then, Take 2 tablets (20 mg) daily for 2 days, then, Take 1 tablets (10 mg) daily for 1 days, then stop 03/01/15   Renae Fickle, MD  tiotropium (SPIRIVA HANDIHALER) 18 MCG inhalation capsule Place 1 capsule (18 mcg total) into inhaler and inhale daily. 03/01/15   Renae Fickle, MD  traMADol (ULTRAM) 50 MG tablet Take 1 tablet (50 mg total) by mouth every 6 (six) hours as needed for moderate pain (cough). 02/10/15   Shanker Levora Dredge, MD   BP 145/92 mmHg  Pulse 105  Temp(Src) 97.6 F (36.4 C) (Oral)  Resp 34  Wt 161 lb 6.4 oz (73.211 kg)  SpO2 95% Physical Exam  Constitutional: He is oriented to person, place, and time.  Chronically ill, tachypneic   HENT:  Head: Normocephalic.  Mouth/Throat: Oropharynx is clear and moist.  Eyes: Conjunctivae are normal. Pupils are  equal, round, and reactive to light.  Neck: Normal range of motion. Neck supple.  Cardiovascular: Regular rhythm and normal heart sounds.   Tachy   Pulmonary/Chest:  Tachypneic, diminished bilateral bases. Minimal wheezing   Abdominal: Soft. Bowel sounds are normal. He exhibits no distension. There is no tenderness. There is no rebound.  Musculoskeletal:  1+ edema bilaterally. L foot ulcer with no active infection   Neurological: He is alert and oriented to person, place, and time.  Skin: Skin is warm and dry.  Psychiatric: He has a normal mood and affect. His behavior is normal. Judgment and thought content normal.  Nursing note and vitals reviewed.  ED Course  Procedures (including critical care time) Labs Review Labs Reviewed  CBC - Abnormal; Notable for the following:    RDW 16.3 (*)    All other components within normal limits  BASIC METABOLIC PANEL - Abnormal; Notable for the following:    Glucose, Bld 197 (*)    Calcium 8.8 (*)    GFR calc non Af Amer 57 (*)    All other components within normal limits  I-STAT TROPOININ, ED - Abnormal; Notable for the following:    Troponin i, poc 0.10 (*)    All other components within normal limits  CULTURE, BLOOD (ROUTINE X 2)  CULTURE, BLOOD (ROUTINE X 2)  BRAIN NATRIURETIC PEPTIDE  I-STAT TROPOININ, ED  I-STAT CG4 LACTIC ACID, ED    Imaging Review Dg Chest Port 1 View  03/26/2015   CLINICAL DATA:  Shortness of breath  EXAM: PORTABLE CHEST - 1 VIEW  COMPARISON:  02/27/2015  FINDINGS: Low lung volumes.  Patchy bilateral lower lobe opacities, atelectasis versus pneumonia. Possible small bilateral pleural effusions.  No pneumothorax.  Cardiomegaly.  IMPRESSION: Patchy bilateral lower lobe opacities, atelectasis versus pneumonia.  Possible small bilateral pleural effusions.   Electronically Signed   By: Charline BillsSriyesh  Krishnan M.D.   On: 03/26/2015 15:24     EKG Interpretation None      Orders placed or performed during the hospital  encounter of 03/26/15  . ED EKG (<4110mins upon arrival to the ED)  . ED EKG (<3710mins upon arrival to the ED)  . EKG 12-Lead  . EKG 12-Lead     MDM   Final diagnoses:  Shortness of breath   Nat MathJames Courtois is a 74 y.o. male hx of CHF, COPD here with shortness of breath, tachycardia. Likely multi factorial from CHF, COPD, ACS. Will get labs, BNP, trop, CXR. Will give nebs, lasix and likely re admit.   4:12 PM  Troponin 0.1. EKG showed TWI laterally unchanged. No chest pain. Likely demand ischemia from hypoxia. Held heparin. Given steroids, albuterol, lasix. Will admit to tele.     Richardean Canalavid H Jacqualine Weichel, MD 03/26/15 [phone]

## 2015-03-26 NOTE — Progress Notes (Signed)
ANTIBIOTIC CONSULT NOTE - INITIAL  Pharmacy Consult for levaquin Indication: COPD  No Known Allergies  Patient Measurements: Weight: 161 lb 6.4 oz (73.211 kg) Adjusted Body Weight:  Vital Signs: Temp: 97.6 F (36.4 C) (05/21 1453) Temp Source: Oral (05/21 1453) BP: 148/91 mmHg (05/21 1615) Pulse Rate: 104 (05/21 1615) Intake/Output from previous day:   Intake/Output from this shift:    Labs:  Recent Labs  03/26/15 1518  WBC 6.3  HGB 13.2  PLT 191  CREATININE 1.23   Estimated Creatinine Clearance: 55.1 mL/min (by C-G formula based on Cr of 1.23). No results for input(s): VANCOTROUGH, VANCOPEAK, VANCORANDOM, GENTTROUGH, GENTPEAK, GENTRANDOM, TOBRATROUGH, TOBRAPEAK, TOBRARND, AMIKACINPEAK, AMIKACINTROU, AMIKACIN in the last 72 hours.   Microbiology: Recent Results (from the past 720 hour(s))  Clostridium Difficile by PCR     Status: None   Collection Time: 02/28/15 12:09 AM  Result Value Ref Range Status   C difficile by pcr NEGATIVE NEGATIVE Final    Medical History: Past Medical History  Diagnosis Date  . Hypertension   . Chronic systolic CHF (congestive heart failure)     a. 2D ECHO 08/13/14: EF 40%, diffuse hypokinesis possibly worse in the inferior wall. Mild MR, mild LA dilation, PA pressure 50. Trivial pericardial effusion.  . Acute systolic CHF (congestive heart failure), NYHA class 4 08/12/2014  . Type II diabetes mellitus   . GERD (gastroesophageal reflux disease)   . Peripheral neuropathy     [name]/notes 08/12/2014  . Stroke 2010    "memory problems since; right leg and left arm weakness since" (02/08/2015)  . Ulcers of both lower extremities     [name]/notes 02/24/2015  . Noncompliance     [name]/notes 02/24/2015  . COPD (chronic obstructive pulmonary disease)     Medications:  Scheduled:  . arformoterol  15 mcg Nebulization BID  . aspirin  81 mg Oral Daily  . atorvastatin  80 mg Oral q1800  . benzonatate  200 mg Oral BID  . budesonide (PULMICORT) nebulizer  solution  0.25 mg Nebulization BID  . [START ON 03/27/2015] carvedilol  6.25 mg Oral BID WC  . chlorpheniramine-HYDROcodone  5 mL Oral Q12H  . collagenase   Topical Daily  . [START ON 03/27/2015] feeding supplement (GLUCERNA SHAKE)  237 mL Oral BID BM  . [START ON 03/27/2015] ferrous sulfate  325 mg Oral BID WC  . fluticasone  2 spray Each Nare Daily  . gabapentin  300 mg Oral TID  . hydrALAZINE  10 mg Oral TID  . insulin aspart  0-5 Units Subcutaneous QHS  . [START ON 03/27/2015] insulin aspart  0-9 Units Subcutaneous TID WC  . ipratropium-albuterol  3 mL Nebulization QID  . isosorbide mononitrate  60 mg Oral Daily  . levofloxacin  750 mg Oral q1800  . loratadine  10 mg Oral Daily  . methylPREDNISolone (SOLU-MEDROL) injection  60 mg Intravenous BID  . nicotine  21 mg Transdermal Daily  . pantoprazole  40 mg Oral Daily  . sodium chloride  3 mL Intravenous Q12H   Infusions:  . sodium chloride     Assessment: 74 yo who came in for SOB. He has a hx of CHF. He had a recent admission for COPD exacerbation. CXR in the ED were concerning for PNA/COPD. Empiric levaquin has been ordered.   Plan:   Levaquin 750mg   PO qday F/u LOT  Ulyses SouthwardMinh Tasnim Balentine, PharmD Pager: [phone] 03/26/2015 5:52 PM

## 2015-03-26 NOTE — Progress Notes (Signed)
Pt admitted to unit from ED. Report received. Pt home meds sent to pharmacy. VS taken HR & BP elevated. Report given to oncoming shift to continue to monitor. Pt oriented to room and unit.   Sandrea HammondJunris Lajuane Leatham RN

## 2015-03-27 DIAGNOSIS — R0602 Shortness of breath: Secondary | ICD-10-CM

## 2015-03-27 DIAGNOSIS — R05 Cough: Secondary | ICD-10-CM

## 2015-03-27 DIAGNOSIS — E785 Hyperlipidemia, unspecified: Secondary | ICD-10-CM

## 2015-03-27 LAB — BASIC METABOLIC PANEL
Anion gap: 10 (ref 5–15)
BUN: 18 mg/dL (ref 6–20)
CO2: 24 mmol/L (ref 22–32)
Calcium: 7.9 mg/dL — ABNORMAL LOW (ref 8.9–10.3)
Chloride: 96 mmol/L — ABNORMAL LOW (ref 101–111)
Creatinine, Ser: 1.54 mg/dL — ABNORMAL HIGH (ref 0.61–1.24)
GFR, EST AFRICAN AMERICAN: 51 mL/min — AB (ref >60)
GFR, EST NON AFRICAN AMERICAN: 44 mL/min — AB (ref >60)
GLUCOSE: 485 mg/dL — AB (ref 65–99)
POTASSIUM: 4 mmol/L (ref 3.5–5.1)
Sodium: 130 mmol/L — ABNORMAL LOW (ref 135–145)

## 2015-03-27 LAB — GLUCOSE, CAPILLARY
GLUCOSE-CAPILLARY: 371 mg/dL — AB (ref 65–99)
Glucose-Capillary: 412 mg/dL — ABNORMAL HIGH (ref 65–99)
Glucose-Capillary: 437 mg/dL — ABNORMAL HIGH (ref 65–99)
Glucose-Capillary: 448 mg/dL — ABNORMAL HIGH (ref 65–99)
Glucose-Capillary: 369 mg/dL — ABNORMAL HIGH (ref 65–99)
Glucose-Capillary: 362 mg/dL — ABNORMAL HIGH (ref 65–99)
Glucose-Capillary: 160 mg/dL — ABNORMAL HIGH (ref 65–99)

## 2015-03-27 LAB — TROPONIN I
TROPONIN I: 0.07 ng/mL — AB (ref <0.031)
Troponin I: 0.05 ng/mL — ABNORMAL HIGH (ref <0.031)

## 2015-03-27 LAB — CBC
HEMATOCRIT: 36.8 % — AB (ref 39.0–52.0)
HEMOGLOBIN: 12.1 g/dL — AB (ref 13.0–17.0)
MCH: 29.3 pg (ref 26.0–34.0)
MCHC: 32.9 g/dL (ref 30.0–36.0)
MCV: 89.1 fL (ref 78.0–100.0)
Platelets: 170 10*3/uL (ref 150–400)
RBC: 4.13 MIL/uL — ABNORMAL LOW (ref 4.22–5.81)
RDW: 16.3 % — AB (ref 11.5–15.5)
WBC: 3.9 10*3/uL — ABNORMAL LOW (ref 4.0–10.5)

## 2015-03-27 MED ORDER — INSULIN ASPART 100 UNIT/ML ~~LOC~~ SOLN
10.0000 [IU] | Freq: Once | SUBCUTANEOUS | Status: AC
Start: 2015-03-27 — End: 2015-03-27
  Administered 2015-03-27: 10 [IU] via SUBCUTANEOUS

## 2015-03-27 MED ORDER — LEVOFLOXACIN 750 MG PO TABS
750.0000 mg | ORAL_TABLET | ORAL | Status: DC
  Administered 2015-03-28: 750 mg via ORAL
  Filled 2015-03-27: qty 1

## 2015-03-27 MED ORDER — IPRATROPIUM-ALBUTEROL 0.5-2.5 (3) MG/3ML IN SOLN
3.0000 mL | Freq: Three times a day (TID) | RESPIRATORY_TRACT | Status: DC
  Administered 2015-03-27 – 2015-04-01 (×14): 3 mL via RESPIRATORY_TRACT
  Filled 2015-03-27 (×14): qty 3

## 2015-03-27 MED ORDER — INSULIN GLARGINE 100 UNIT/ML ~~LOC~~ SOLN
10.0000 [IU] | Freq: Every day | SUBCUTANEOUS | Status: DC
  Administered 2015-03-27: 10 [IU] via SUBCUTANEOUS
  Filled 2015-03-27 (×2): qty 0.1

## 2015-03-27 NOTE — Progress Notes (Signed)
ANTIBIOTIC CONSULT NOTE - INITIAL  Pharmacy Consult for levaquin Indication: COPD  No Known Allergies  Patient Measurements: Weight: 162 lb 11.2 oz (73.8 kg) Adjusted Body Weight:  Vital Signs: Temp: 97.7 F (36.5 C) (05/22 1426) Temp Source: Oral (05/22 1426) BP: 123/73 mmHg (05/22 1426) Pulse Rate: 89 (05/22 1426) Intake/Output from previous day: 05/21 0701 - 05/22 0700 In: 960 [P.O.:960] Out: 900 [Urine:900] Intake/Output from this shift: Total I/O In: 723 [P.O.:720; I.V.:3] Out: 300 [Urine:300]  Labs:  Recent Labs  03/26/15 1518 03/27/15 0544  WBC 6.3 3.9*  HGB 13.2 12.1*  PLT 191 170  CREATININE 1.23 1.54*   Estimated Creatinine Clearance: 44 mL/min (by C-G formula based on Cr of 1.54). No results for input(s): VANCOTROUGH, VANCOPEAK, VANCORANDOM, GENTTROUGH, GENTPEAK, GENTRANDOM, TOBRATROUGH, TOBRAPEAK, TOBRARND, AMIKACINPEAK, AMIKACINTROU, AMIKACIN in the last 72 hours.   Microbiology: Recent Results (from the past 720 hour(s))  Clostridium Difficile by PCR     Status: None   Collection Time: 02/28/15 12:09 AM  Result Value Ref Range Status   C difficile by pcr NEGATIVE NEGATIVE Final    Medical History: Past Medical History  Diagnosis Date  . Hypertension   . Chronic systolic CHF (congestive heart failure)     a. 2D ECHO 08/13/14: EF 40%, diffuse hypokinesis possibly worse in the inferior wall. Mild MR, mild LA dilation, PA pressure 50. Trivial pericardial effusion.  . Acute systolic CHF (congestive heart failure), NYHA class 4 08/12/2014  . Type II diabetes mellitus   . GERD (gastroesophageal reflux disease)   . Peripheral neuropathy     [name] 08/12/2014  . Stroke 2010    "memory problems since; right leg and left arm weakness since" (02/08/2015)  . Ulcers of both lower extremities     [name] 02/24/2015  . Noncompliance     [name] 02/24/2015  . COPD (chronic obstructive pulmonary disease)     Medications:  Scheduled:  . arformoterol  15 mcg  Nebulization BID  . aspirin  81 mg Oral Daily  . atorvastatin  80 mg Oral q1800  . benzonatate  200 mg Oral BID  . budesonide (PULMICORT) nebulizer solution  0.25 mg Nebulization BID  . carvedilol  6.25 mg Oral BID WC  . chlorpheniramine-HYDROcodone  5 mL Oral Q12H  . collagenase   Topical Daily  . feeding supplement (GLUCERNA SHAKE)  237 mL Oral BID BM  . ferrous sulfate  325 mg Oral BID WC  . fluticasone  2 spray Each Nare Daily  . gabapentin  300 mg Oral TID  . hydrALAZINE  10 mg Oral TID  . insulin aspart  0-5 Units Subcutaneous QHS  . insulin aspart  0-9 Units Subcutaneous TID WC  . ipratropium-albuterol  3 mL Nebulization TID  . isosorbide mononitrate  60 mg Oral Daily  . [START ON 03/28/2015] levofloxacin  750 mg Oral Q48H  . loratadine  10 mg Oral Daily  . methylPREDNISolone (SOLU-MEDROL) injection  60 mg Intravenous BID  . nicotine  21 mg Transdermal Daily  . pantoprazole  40 mg Oral Daily  . sodium chloride  3 mL Intravenous Q12H   Infusions:  . sodium chloride 1 mL (03/27/15 1253)   Assessment: 74 yo who came in for SOB. He has a hx of CHF. He had a recent admission for COPD exacerbation. CXR in the ED were concerning for PNA/COPD. Empiric levaquin has been ordered.  Afebrile, wbc low 3.9K. scr up 1.23 > 1.54 (BL ~ 1.3), est. Crcl ~ 38ml/min  Levaquin 5/21 >>  5/21 Blood x 2 -  Plan:  Adjust Levaquin to 750mg  PO 48 hrs  F/u renal function, adjust if needed.   Bayard HuggerMei Tron Flythe, PharmD, BCPS  Clinical Pharmacist  Pager: [phone]   03/27/2015 3:32 PM

## 2015-03-27 NOTE — Progress Notes (Signed)
Pharmacist Heart Failure Core Measure Documentation  Assessment: Russell Hamilton has an EF documented as 30-35% on 02/27/2015 by 2D Echo.  Rationale: Heart failure patients with left ventricular systolic dysfunction (LVSD) and an EF < 40% should be prescribed an angiotensin converting enzyme inhibitor (ACEI) or angiotensin receptor blocker (ARB) at discharge unless a contraindication is documented in the medical record.  This patient is not currently on an ACEI or ARB for HF.  This note is being placed in the record in order to provide documentation that a contraindication to the use of these agents is present for this encounter.  ACE Inhibitor or Angiotensin Receptor Blocker is contraindicated (specify all that apply)  []   ACEI allergy AND ARB allergy []   Angioedema []   Moderate or severe aortic stenosis []   Hyperkalemia []   Hypotension []   Renal artery stenosis [x]   Worsening renal function, preexisting renal disease or dysfunction   Riki RuskBell, Yue Flanigan Wang 03/27/2015 3:50 PM

## 2015-03-27 NOTE — Progress Notes (Signed)
[facility] PROGRESS NOTE  Russell MathJames Pesantez JYN:[id number] DOB: 08/27/1941 DOA: 03/26/2015 PCP: Jaclyn ShaggyEnobong, Amao, MD  Assessment/Plan:  Acute COPD exacerbation, noncompliant with dulera and spiriva, now with post-tussive emesis. -continue Solumedrol  -Budesonide and brovana BID -Duoneb TID and albuterol q2h prn -continue Levofloxacin  -oxygen supplementation and Wean off as tolerated - start flutter valve  Chronic systolic and diastolic CHF ECHO last done on 02/27/2015 which demonstrated ejection fraction of 30-35% with diffuse hypokinesis, grade 1 diastolic dysfunction. Refer to official report for details. - Telemetry monitoring for 24 hours - Daily weights and strict I/O - TSH 1.162 WNL on 4/23 - No ACEI or spironolactone secondary to CKD - continue imdur and hydralazine - Continue beta blocker - Holding lasix due to elevated lactic acid and mild dehydration - 2 gm sodium diet  - Wean oxygen as tolerated - Nursing staff to provide education about CHF  CAD but patient not interested in cath; but elevated troponin/possible NSTEMI -troponin neg -Continue daily aspirin -continue statin and BB - Lipid panel and A1c just done last month and patient noncompliant with medications; will no repeat them  HTN, fairly well control while on medications (big issues with compliance) -Continue hydralazine and BB -follow heart healthy diet  Chronic kidney disease stage 3, baseline creatinine 1.3 due to diabetes and high blood pressure -Minimize nephrotoxins and renally dose medications -appreciate pharmacy assistance with medication adjustments -will follow renal function   Diabetes mellitus type 2 with renal manifestations - A1c 9.3 on 02/26/2015 - He has not been taking his oral medications as prescribed - SSI and HS insulin; also lantus -expecting elevated sugar due to steroids  Iron deficiency anemia, hemoglobin at baseline of 12-13 -Iron studies consistent  with iron deficiency, B12 773 , folate 8.3, TSH 1.162 -Started on ferrous sulfate -will need outpatient follow up with gastroenterology for colonoscopy -no active bleeding currently   Tobacco abuse  -cessation counseling provided -continue nicotine patch   Noncompliance with appointments and lack of insurance, very limited and poor insight into medical conditions.  -case manager consulted -Education with teach back about insurance, medications, and health problems -discussion about importance of compliance and needs for him to follow with the doctors appointments    Code Status: DNR Family Communication: no family at bedside Disposition Plan: home with resumption of home services; but will determine close to be medically stable in case he is more deconditioned    Consultants:  None   Procedures:  See below for x-ray reports   Antibiotics:  levaquin 5/21  HPI/Subjective: Feeling so-so; still SOB, with coughing spells and unable to speak in full sentences  Objective: Filed Vitals:   03/27/15 1426  BP: 123/73  Pulse: 89  Temp: 97.7 F (36.5 C)  Resp: 18    Intake/Output Summary (Last 24 hours) at 03/27/15 1619 Last data filed at 03/27/15 1426  Gross per 24 hour  Intake   1683 ml  Output   1200 ml  Net    483 ml   Filed Weights   03/26/15 1505 03/27/15 0510  Weight: 73.211 kg (161 lb 6.4 oz) 73.8 kg (162 lb 11.2 oz)    Exam:   General:  SOB, unable to speak in full sentences, no CP; complaining of coughing spells. No fever.  Cardiovascular: S1 and S2, no rubs or gallops  Respiratory: diffuse rhonchi, positive exp wheezing, no crackles  Abdomen: soft, NT, ND, positive BS  Musculoskeletal: no joint swelling, no edema  Skin: positive healing ulcers on the  lateral aspects of both of his feet (which are chronic), no evidence of superimposed infection  Data Reviewed: Basic Metabolic Panel:  Recent Labs Lab 03/26/15 1518 03/27/15 0544  NA 138 130*   K 3.9 4.0  CL 102 96*  CO2 26 24  GLUCOSE 197* 485*  BUN 10 18  CREATININE 1.23 1.54*  CALCIUM 8.8* 7.9*   CBC:  Recent Labs Lab 03/26/15 1518 03/27/15 0544  WBC 6.3 3.9*  HGB 13.2 12.1*  HCT 40.6 36.8*  MCV 90.8 89.1  PLT 191 170   Cardiac Enzymes:  Recent Labs Lab 03/26/15 1910 03/26/15 2325 03/27/15 0544  TROPONINI 0.06* 0.05* 0.07*   BNP (last 3 results)  Recent Labs  02/08/15 1502 02/24/15 1911 03/26/15 1516  BNP 1355.4* 1739.5* 1712.3*    ProBNP (last 3 results)  Recent Labs  08/12/14 1756  PROBNP 7427.0*    CBG:  Recent Labs Lab 03/27/15 0608 03/27/15 0612 03/27/15 0700 03/27/15 0733 03/27/15 1113  GLUCAP 437* 412* 448* 371* 369*    No results found for this or any previous visit (from the past 240 hour(s)).   Studies: Dg Chest Port 1 View  03/26/2015   CLINICAL DATA:  Shortness of breath  EXAM: PORTABLE CHEST - 1 VIEW  COMPARISON:  02/27/2015  FINDINGS: Low lung volumes.  Patchy bilateral lower lobe opacities, atelectasis versus pneumonia. Possible small bilateral pleural effusions.  No pneumothorax.  Cardiomegaly.  IMPRESSION: Patchy bilateral lower lobe opacities, atelectasis versus pneumonia.  Possible small bilateral pleural effusions.   Electronically Signed   By: Charline Bills M.D.   On: 03/26/2015 15:24    Scheduled Meds: . arformoterol  15 mcg Nebulization BID  . aspirin  81 mg Oral Daily  . atorvastatin  80 mg Oral q1800  . benzonatate  200 mg Oral BID  . budesonide (PULMICORT) nebulizer solution  0.25 mg Nebulization BID  . carvedilol  6.25 mg Oral BID WC  . chlorpheniramine-HYDROcodone  5 mL Oral Q12H  . collagenase   Topical Daily  . feeding supplement (GLUCERNA SHAKE)  237 mL Oral BID BM  . ferrous sulfate  325 mg Oral BID WC  . fluticasone  2 spray Each Nare Daily  . gabapentin  300 mg Oral TID  . hydrALAZINE  10 mg Oral TID  . insulin aspart  0-5 Units Subcutaneous QHS  . insulin aspart  0-9 Units  Subcutaneous TID WC  . insulin glargine  10 Units Subcutaneous QHS  . ipratropium-albuterol  3 mL Nebulization TID  . isosorbide mononitrate  60 mg Oral Daily  . [START ON 03/28/2015] levofloxacin  750 mg Oral Q48H  . loratadine  10 mg Oral Daily  . methylPREDNISolone (SOLU-MEDROL) injection  60 mg Intravenous BID  . nicotine  21 mg Transdermal Daily  . pantoprazole  40 mg Oral Daily  . sodium chloride  3 mL Intravenous Q12H   Continuous Infusions: . sodium chloride 1 mL (03/27/15 1253)    Principal Problem:   COPD with acute exacerbation Active Problems:   HTN (hypertension)   Peripheral neuropathy   Type II diabetes mellitus with renal manifestations, uncontrolled   Elevated troponin   Cough, persistent   Type 2 diabetes mellitus with diabetic chronic kidney disease   COPD (chronic obstructive pulmonary disease)    Time spent: 35 minutes    Vassie Loll  [facility] Pager [phone]. If 7PM-7AM, please contact night-coverage at [email or web address], password [facility] 03/27/2015, 4:19 PM  LOS: 1 day

## 2015-03-28 DIAGNOSIS — K219 Gastro-esophageal reflux disease without esophagitis: Secondary | ICD-10-CM

## 2015-03-28 LAB — CBC
HCT: 34.3 % — ABNORMAL LOW (ref 39.0–52.0)
Hemoglobin: 11.3 g/dL — ABNORMAL LOW (ref 13.0–17.0)
MCH: 29.3 pg (ref 26.0–34.0)
MCHC: 32.9 g/dL (ref 30.0–36.0)
MCV: 88.9 fL (ref 78.0–100.0)
Platelets: 164 10*3/uL (ref 150–400)
RBC: 3.86 MIL/uL — AB (ref 4.22–5.81)
RDW: 16.5 % — AB (ref 11.5–15.5)
WBC: 9.6 10*3/uL (ref 4.0–10.5)

## 2015-03-28 LAB — BASIC METABOLIC PANEL
ANION GAP: 10 (ref 5–15)
BUN: 24 mg/dL — ABNORMAL HIGH (ref 6–20)
CO2: 23 mmol/L (ref 22–32)
Calcium: 7.9 mg/dL — ABNORMAL LOW (ref 8.9–10.3)
Chloride: 97 mmol/L — ABNORMAL LOW (ref 101–111)
Creatinine, Ser: 1.38 mg/dL — ABNORMAL HIGH (ref 0.61–1.24)
GFR calc Af Amer: 58 mL/min — ABNORMAL LOW (ref >60)
GFR calc non Af Amer: 50 mL/min — ABNORMAL LOW (ref >60)
Glucose, Bld: 306 mg/dL — ABNORMAL HIGH (ref 65–99)
Potassium: 4.6 mmol/L (ref 3.5–5.1)
Sodium: 130 mmol/L — ABNORMAL LOW (ref 135–145)

## 2015-03-28 LAB — GLUCOSE, CAPILLARY
GLUCOSE-CAPILLARY: 264 mg/dL — AB (ref 65–99)
GLUCOSE-CAPILLARY: 348 mg/dL — AB (ref 65–99)
Glucose-Capillary: 385 mg/dL — ABNORMAL HIGH (ref 65–99)
Glucose-Capillary: 286 mg/dL — ABNORMAL HIGH (ref 65–99)

## 2015-03-28 MED ORDER — INSULIN GLARGINE 100 UNIT/ML ~~LOC~~ SOLN
20.0000 [IU] | Freq: Every day | SUBCUTANEOUS | Status: DC
  Administered 2015-03-28 – 2015-03-29 (×2): 20 [IU] via SUBCUTANEOUS
  Filled 2015-03-28 (×2): qty 0.2

## 2015-03-28 NOTE — Progress Notes (Signed)
Inpatient Diabetes Program Recommendations  AACE/ADA: New Consensus Statement on Inpatient Glycemic Control (2013)  Target Ranges:  Prepandial:   less than 140 mg/dL      Peak postprandial:   less than 180 mg/dL (1-2 hours)      Critically ill patients:  140 - 180 mg/dL   Reason for Assessment:  Results for Russell Hamilton, Russell Hamilton (MRN [id number]) as of 03/28/2015 14:59  Ref. Range 03/27/2015 11:13 03/27/2015 16:18 03/27/2015 21:30 03/28/2015 06:14 03/28/2015 11:21  Glucose-Capillary Latest Ref Range: 65-99 mg/dL 409369 (H) 811362 (H) 914160 (H) 264 (H) 385 (H)  Results for Russell Hamilton, Russell Hamilton (MRN [id number]) as of 03/28/2015 14:59  Ref. Range 02/26/2015 05:50  Hemoglobin A1C Latest Ref Range: 4.8-5.6 % 9.3 (H)    Diabetes history: Type 2 diabetes Outpatient Diabetes medications: Amaryl 2 mg daily Current orders for Inpatient glycemic control:  Lantus 10 units q HS, Novolog sensitive tid with meals and HS  Please consider increasing Lantus to 20 units daily and add Novolog meal coverage 4 units tid with meals (hold if patient eats less than 50%).  A1C indicates CBG uncontrolled prior to admit.  May need insulin at discharge?  Thanks, Beryl MeagerJenny Mikenzie Mccannon, RN, BC-ADM Inpatient Diabetes Coordinator Pager [phone] (8a-5p)

## 2015-03-28 NOTE — Progress Notes (Signed)
Utilization review completed.  

## 2015-03-28 NOTE — Clinical Documentation Improvement (Signed)
  Please document a condition and/or indication for the following medications the patient is receiving this admission:   - Protonix 40 mg po daily   - Lipitor 80 mg po daily   Thank You, Jerral Ralphathy R Sadako Cegielski ,RN Clinical Documentation Specialist:  [phone] [place]- [facility] Information Management

## 2015-03-28 NOTE — Progress Notes (Signed)
[facility] PROGRESS NOTE  Russell Hamilton WUJ:[id number] DOB: 01-31-1941 DOA: 03/26/2015 PCP: Jaclyn Shaggy, MD  Assessment/Plan:  Acute COPD exacerbation, noncompliant with dulera and spiriva, now with post-tussive emesis. -continue Solumedrol  -Budesonide and brovana BID -Duoneb TID and albuterol q2h prn -continue Levofloxacin  -oxygen supplementation and Wean off as tolerated - continue flutter valve  Chronic systolic and diastolic CHF ECHO last done on 02/27/2015 which demonstrated ejection fraction of 30-35% with diffuse hypokinesis, grade 1 diastolic dysfunction. Refer to official report for details. - Telemetry monitoring for 24 hours - Daily weights and strict I/O - TSH 1.162 WNL on 4/23 - No ACEI or spironolactone secondary to CKD - continue imdur and hydralazine - Continue beta blocker - Holding lasix due to elevated lactic acid and mild dehydration - 2 gm sodium diet  - Wean oxygen as tolerated - Nursing staff to provide education about CHF  CAD but patient not interested in cath; but elevated troponin/possible NSTEMI -troponin neg -Continue daily aspirin -continue statin and BB - Lipid panel and A1c just done last month and patient noncompliant with medications; will no repeat them  HTN, fairly well control while on medications (big issues with compliance) -Continue hydralazine and BB -follow heart healthy diet  Chronic kidney disease stage 3, baseline creatinine 1.3 due to diabetes and high blood pressure -Minimize nephrotoxins and renally dose medications -appreciate pharmacy assistance with medication adjustments -will follow renal function   Diabetes mellitus type 2 with renal manifestations - A1c 9.3 on 02/26/2015 - He has not been taking his oral medications as prescribed - SSI and HS insulin; also lantus -expecting elevated sugar due to steroids  Iron deficiency anemia, hemoglobin at baseline of 12-13 -Iron studies  consistent with iron deficiency, B12 773 , folate 8.3, TSH 1.162 -Started on ferrous sulfate -will need outpatient follow up with gastroenterology for colonoscopy -no active bleeding currently   Tobacco abuse  -cessation counseling provided -continue nicotine patch   HLD -continue statins  Protonix -continue PPI  Noncompliance with appointments and lack of insurance, very limited and poor insight into medical conditions.  -case manager consulted -Education with teach back about insurance, medications, and health problems -discussion about importance of compliance and needs for him to follow with the doctors appointments    Code Status: DNR Family Communication: no family at bedside Disposition Plan: home with resumption of home services; but will determine close to be medically stable in case he is more deconditioned    Consultants:  None   Procedures:  See below for x-ray reports   Antibiotics:  levaquin 5/21  HPI/Subjective: Feeling better, no fever, no CP. Still with SOB but endorses some improvement  Objective: Filed Vitals:   03/28/15 1655  BP: 122/76  Pulse: 94  Temp:   Resp: 20    Intake/Output Summary (Last 24 hours) at 03/28/15 1716 Last data filed at 03/28/15 1530  Gross per 24 hour  Intake 3832.5 ml  Output   1275 ml  Net 2557.5 ml   Filed Weights   03/26/15 1505 03/27/15 0510 03/28/15 0429  Weight: 73.211 kg (161 lb 6.4 oz) 73.8 kg (162 lb 11.2 oz) 77.1 kg (169 lb 15.6 oz)    Exam:   General:  No fever, slight improvement in his breathing, no CP; complaining of coughing spells (but less). No nausea or vomiting   Cardiovascular: S1 and S2, no rubs or gallops  Respiratory: diffuse rhonchi, positive exp wheezing, no crackles  Abdomen: soft, NT, ND, positive BS  Musculoskeletal:  no joint swelling, no edema  Skin: positive healing ulcers on the lateral aspects of both of his feet (which are chronic), no evidence of superimposed  infection  Data Reviewed: Basic Metabolic Panel:  Recent Labs Lab 03/26/15 1518 03/27/15 0544 03/28/15 0435  NA 138 130* 130*  K 3.9 4.0 4.6  CL 102 96* 97*  CO2 26 24 23   GLUCOSE 197* 485* 306*  BUN 10 18 24*  CREATININE 1.23 1.54* 1.38*  CALCIUM 8.8* 7.9* 7.9*   CBC:  Recent Labs Lab 03/26/15 1518 03/27/15 0544 03/28/15 0435  WBC 6.3 3.9* 9.6  HGB 13.2 12.1* 11.3*  HCT 40.6 36.8* 34.3*  MCV 90.8 89.1 88.9  PLT 191 170 164   Cardiac Enzymes:  Recent Labs Lab 03/26/15 1910 03/26/15 2325 03/27/15 0544  TROPONINI 0.06* 0.05* 0.07*   BNP (last 3 results)  Recent Labs  02/08/15 1502 02/24/15 1911 03/26/15 1516  BNP 1355.4* 1739.5* 1712.3*    ProBNP (last 3 results)  Recent Labs  08/12/14 1756  PROBNP 7427.0*    CBG:  Recent Labs Lab 03/27/15 1618 03/27/15 2130 03/28/15 0614 03/28/15 1121 03/28/15 1658  GLUCAP 362* 160* 264* 385* 348*    Recent Results (from the past 240 hour(s))  Blood culture (routine x 2)     Status: None (Preliminary result)   Collection Time: 03/26/15  4:00 PM  Result Value Ref Range Status   Specimen Description BLOOD RIGHT ARM  Final   Special Requests BOTTLES DRAWN AEROBIC AND ANAEROBIC 10ML  Final   Culture   Final           BLOOD CULTURE RECEIVED NO GROWTH TO DATE CULTURE WILL BE HELD FOR 5 DAYS BEFORE ISSUING A FINAL NEGATIVE REPORT [facility]    Report Status PENDING  Incomplete  Blood culture (routine x 2)     Status: None (Preliminary result)   Collection Time: 03/26/15  4:15 PM  Result Value Ref Range Status   Specimen Description BLOOD LEFT ARM  Final   Special Requests BOTTLES DRAWN AEROBIC AND ANAEROBIC 10CC  Final   Culture   Final           BLOOD CULTURE RECEIVED NO GROWTH TO DATE CULTURE WILL BE HELD FOR 5 DAYS BEFORE ISSUING A FINAL NEGATIVE REPORT Performed at [facility]    Report Status PENDING  Incomplete     Studies: No results found.  Scheduled  Meds: . arformoterol  15 mcg Nebulization BID  . aspirin  81 mg Oral Daily  . atorvastatin  80 mg Oral q1800  . benzonatate  200 mg Oral BID  . budesonide (PULMICORT) nebulizer solution  0.25 mg Nebulization BID  . carvedilol  6.25 mg Oral BID WC  . chlorpheniramine-HYDROcodone  5 mL Oral Q12H  . collagenase   Topical Daily  . feeding supplement (GLUCERNA SHAKE)  237 mL Oral BID BM  . ferrous sulfate  325 mg Oral BID WC  . fluticasone  2 spray Each Nare Daily  . gabapentin  300 mg Oral TID  . hydrALAZINE  10 mg Oral TID  . insulin aspart  0-5 Units Subcutaneous QHS  . insulin aspart  0-9 Units Subcutaneous TID WC  . insulin glargine  20 Units Subcutaneous QHS  . ipratropium-albuterol  3 mL Nebulization TID  . isosorbide mononitrate  60 mg Oral Daily  . levofloxacin  750 mg Oral Q48H  . loratadine  10 mg Oral Daily  . methylPREDNISolone (SOLU-MEDROL) injection  60 mg Intravenous BID  . nicotine  21 mg Transdermal Daily  . pantoprazole  40 mg Oral Daily  . sodium chloride  3 mL Intravenous Q12H   Continuous Infusions: . sodium chloride 75 mL/hr at 03/28/15 1203    Principal Problem:   COPD with acute exacerbation Active Problems:   HTN (hypertension)   Peripheral neuropathy   Type II diabetes mellitus with renal manifestations, uncontrolled   Elevated troponin   Cough, persistent   Type 2 diabetes mellitus with diabetic chronic kidney disease   COPD (chronic obstructive pulmonary disease)    Time spent: 35 minutes    Vassie Loll  [facility] Pager [phone]. If 7PM-7AM, please contact night-coverage at [email or web address], password [facility] 03/28/2015, 5:16 PM  LOS: 2 days

## 2015-03-29 LAB — CBC
HEMATOCRIT: 35.8 % — AB (ref 39.0–52.0)
Hemoglobin: 11.7 g/dL — ABNORMAL LOW (ref 13.0–17.0)
MCH: 29.8 pg (ref 26.0–34.0)
MCHC: 32.7 g/dL (ref 30.0–36.0)
MCV: 91.1 fL (ref 78.0–100.0)
Platelets: 179 10*3/uL (ref 150–400)
RBC: 3.93 MIL/uL — ABNORMAL LOW (ref 4.22–5.81)
RDW: 16.6 % — AB (ref 11.5–15.5)
WBC: 10.3 10*3/uL (ref 4.0–10.5)

## 2015-03-29 LAB — GLUCOSE, CAPILLARY
GLUCOSE-CAPILLARY: 331 mg/dL — AB (ref 65–99)
Glucose-Capillary: 276 mg/dL — ABNORMAL HIGH (ref 65–99)
Glucose-Capillary: 269 mg/dL — ABNORMAL HIGH (ref 65–99)
Glucose-Capillary: 289 mg/dL — ABNORMAL HIGH (ref 65–99)

## 2015-03-29 LAB — BASIC METABOLIC PANEL
Anion gap: 6 (ref 5–15)
BUN: 26 mg/dL — ABNORMAL HIGH (ref 6–20)
CALCIUM: 8.3 mg/dL — AB (ref 8.9–10.3)
CO2: 25 mmol/L (ref 22–32)
Chloride: 101 mmol/L (ref 101–111)
Creatinine, Ser: 1.23 mg/dL (ref 0.61–1.24)
GFR calc Af Amer: >60 mL/min (ref >60)
GFR calc non Af Amer: 57 mL/min — ABNORMAL LOW (ref >60)
GLUCOSE: 254 mg/dL — AB (ref 65–99)
Potassium: 4.6 mmol/L (ref 3.5–5.1)
SODIUM: 132 mmol/L — AB (ref 135–145)

## 2015-03-29 MED ORDER — INSULIN GLARGINE 100 UNIT/ML ~~LOC~~ SOLN
25.0000 [IU] | Freq: Every day | SUBCUTANEOUS | Status: DC
  Administered 2015-03-30 – 2015-03-31 (×2): 25 [IU] via SUBCUTANEOUS
  Filled 2015-03-29 (×3): qty 0.25

## 2015-03-29 MED ORDER — LEVOFLOXACIN 750 MG PO TABS
750.0000 mg | ORAL_TABLET | Freq: Every day | ORAL | Status: DC
  Administered 2015-03-29 – 2015-03-31 (×3): 750 mg via ORAL
  Filled 2015-03-29 (×4): qty 1

## 2015-03-29 MED ORDER — METHYLPREDNISOLONE SODIUM SUCC 40 MG IJ SOLR
40.0000 mg | Freq: Two times a day (BID) | INTRAMUSCULAR | Status: DC
  Administered 2015-03-30: 40 mg via INTRAVENOUS
  Filled 2015-03-29 (×2): qty 1

## 2015-03-29 NOTE — Progress Notes (Signed)
Inpatient Diabetes Program Recommendations  AACE/ADA: New Consensus Statement on Inpatient Glycemic Control (2013)  Target Ranges:  Prepandial:   less than 140 mg/dL      Peak postprandial:   less than 180 mg/dL (1-2 hours)      Critically ill patients:  140 - 180 mg/dL   Reason for Visit: Elevated A1C and blood sugars. Results for Russell Hamilton, Russell Hamilton (MRN [id number]) as of 03/29/2015 15:07  Ref. Range 11/20/2014 04:20 02/26/2015 05:50  Hemoglobin A1C Latest Ref Range: 4.8-5.6 % 7.5 (H) 9.3 (H)   Diabetes history: Type 2 diabetes Outpatient Diabetes medications: Amaryl 2 mg daily Current orders for Inpatient glycemic control:  Novolog sensitive tid with meals and HS, Lantus 20 units daily at HS  Spoke to patient regarding elevated A1C.  He states that [place] recently [place] from [place]. He takes "a pill" for his diabetes.  When I asked him about the potential of being on insulin, he states "No, I don't do needles".  He states that he plans to get a PCP in the area.  Discussed importance of controlling CBG's.  He states that he has the book from his last hospitalization.  Likely needs titration up of Amaryl and the addition of another oral agent at discharge to control diabetes.   Agree with using basal/bolus while patient is in the hospital. Consider increasing Lantus to 30 units daily.  Thanks, Beryl MeagerJenny Gwendolyn Nishi, RN, BC-ADM Inpatient Diabetes Coordinator Pager [phone] (8a-5p)

## 2015-03-29 NOTE — Consult Note (Signed)
   [facility]   03/29/2015  Russell MathJames Hamilton 01/17/1941 [id number] [facility]. Patient evaluated for [facility] Care Management services.  Patient is not eligible for [facility] services because his Medicare is out of state and not affiliated.  For any additional questions or new referrals please contact: Charlesetta ShanksVictoria Maral Lampe, RN, BSN, [facility] Hospital Liaison [phone]

## 2015-03-29 NOTE — Progress Notes (Signed)
SATURATION QUALIFICATIONS: (This note is used to comply with regulatory documentation for home oxygen)  Patient Saturations on Room Air at Rest = 95%  Patient Saturations on Room Air while Ambulating = 88%  Patient Saturations on 2 Liters of oxygen while Ambulating = 92%  Please briefly explain why patient needs home oxygen: pt ambulated with RN [id number] in hallway and on way back to room; pt c/o light headedness; tachy on monitor and pt had to sit in chair to be wheeled back to room; pt noted to have unsteady gait with cane but refused rolling walker when offered; pt back in room remaining on 2L oxygen into bed with call light within reach. Will closely monitor. Russell MerlesP. Amo Mersadie Kavanaugh RN.

## 2015-03-29 NOTE — Progress Notes (Signed)
ANTIBIOTIC CONSULT NOTE - FOLLOW UP  Pharmacy Consult for levaquin Indication: COPD  No Known Allergies  Patient Measurements: Weight: 174 lb 6.1 oz (79.1 kg)   Vital Signs: Temp: 97.5 F (36.4 C) (05/24 0335) Temp Source: Oral (05/24 0335) BP: 134/84 mmHg (05/24 0335) Pulse Rate: 81 (05/24 0335) Intake/Output from previous day: 05/23 0701 - 05/24 0700 In: 1857 [P.O.:957; I.V.:900] Out: 475 [Urine:475] Intake/Output from this shift:    Labs:  Recent Labs  03/27/15 0544 03/28/15 0435 03/29/15 0425  WBC 3.9* 9.6 10.3  HGB 12.1* 11.3* 11.7*  PLT 170 164 179  CREATININE 1.54* 1.38* 1.23   Estimated Creatinine Clearance: 55.1 mL/min (by C-G formula based on Cr of 1.23). No results for input(s): VANCOTROUGH, VANCOPEAK, VANCORANDOM, GENTTROUGH, GENTPEAK, GENTRANDOM, TOBRATROUGH, TOBRAPEAK, TOBRARND, AMIKACINPEAK, AMIKACINTROU, AMIKACIN in the last 72 hours.   Microbiology: Recent Results (from the past 720 hour(s))  Clostridium Difficile by PCR     Status: None   Collection Time: 02/28/15 12:09 AM  Result Value Ref Range Status   C difficile by pcr NEGATIVE NEGATIVE Final  Blood culture (routine x 2)     Status: None (Preliminary result)   Collection Time: 03/26/15  4:00 PM  Result Value Ref Range Status   Specimen Description BLOOD RIGHT ARM  Final   Special Requests BOTTLES DRAWN AEROBIC AND ANAEROBIC 10ML  Final   Culture   Final           BLOOD CULTURE RECEIVED NO GROWTH TO DATE CULTURE WILL BE HELD FOR 5 DAYS BEFORE ISSUING A FINAL NEGATIVE REPORT Performed at [facility]    Report Status PENDING  Incomplete  Blood culture (routine x 2)     Status: None (Preliminary result)   Collection Time: 03/26/15  4:15 PM  Result Value Ref Range Status   Specimen Description BLOOD LEFT ARM  Final   Special Requests BOTTLES DRAWN AEROBIC AND ANAEROBIC 10CC  Final   Culture   Final           BLOOD CULTURE RECEIVED NO GROWTH TO DATE CULTURE WILL BE HELD FOR 5 DAYS  BEFORE ISSUING A FINAL NEGATIVE REPORT Performed at [facility]    Report Status PENDING  Incomplete    Anti-infectives    Start     Dose/Rate Route Frequency Ordered Stop   03/28/15 2000  levofloxacin (LEVAQUIN) tablet 750 mg     750 mg Oral Every 48 hours 03/27/15 1532     03/26/15 1900  levofloxacin (LEVAQUIN) tablet 750 mg  Status:  Discontinued     750 mg Oral Daily-1800 03/26/15 1747 03/27/15 1532      Assessment: 74 yo male with acute COPD/PNA on levaquin 750mg  po q48hr.  WBC= 10.3, afebrile, SCr= 1.23 (trend down) and CrCl ~ 55,  Levaquin 5/21 >>  5/21 Blood x 2 - ngtd  Plan:  -Change levaquin to 750mg  po daily -Consider a 7 day duration? -Will follow renal function, cultures and clinical progress  Harland Germanndrew Roena Sassaman, Pharm D 03/29/2015 10:21 AM

## 2015-03-29 NOTE — Progress Notes (Signed)
[facility] PROGRESS NOTE  Milad Bublitz ZOX:[id number] DOB: 1941-03-29 DOA: 03/26/2015 PCP: Jaclyn Shaggy, MD  Assessment/Plan:  Acute COPD exacerbation, noncompliant with dulera and spiriva, now with post-tussive emesis. -continue Solumedrol, but will start tapering down dose -Budesonide and brovana BID -Duoneb TID and albuterol q2h prn -continue Levofloxacin  -oxygen supplementation and Wean off as tolerated - continue flutter valve  Chronic systolic and diastolic CHF ECHO last done on 02/27/2015 which demonstrated ejection fraction of 30-35% with diffuse hypokinesis, grade 1 diastolic dysfunction. Refer to official report for details. - Telemetry monitoring for 24 hours - Daily weights and strict I/O - TSH 1.162 WNL on 4/23 - No ACEI or spironolactone secondary to CKD - continue imdur and hydralazine - Continue beta blocker - Holding lasix due to elevated lactic acid and mild dehydration - 2 gm sodium diet  - Wean oxygen as tolerated - Nursing staff to provide education about CHF  CAD but patient not interested in cath; but elevated troponin/possible NSTEMI -troponin neg -Continue daily aspirin -continue statin and BB - Lipid panel and A1c just done last month and patient noncompliant with medications; will no repeat them  HTN, fairly well control while on medications (big issues with compliance) -Continue hydralazine and BB -follow heart healthy diet  Chronic kidney disease stage 3, baseline creatinine 1.3 due to diabetes and high blood pressure -Minimize nephrotoxins and renally dose medications -appreciate pharmacy assistance with medication adjustments -will follow renal function   Diabetes mellitus type 2 with renal manifestations - A1c 9.3 on 02/26/2015 - He has not been taking his oral medications as prescribed - will continue SSI and HS insulin; also lantus while inpatient -expecting elevated sugar due to steroids  Iron deficiency  anemia, hemoglobin at baseline of 12-13 -Iron studies consistent with iron deficiency, B12 773 , folate 8.3, TSH 1.162 -Started on ferrous sulfate -will need outpatient follow up with gastroenterology for colonoscopy -no active bleeding currently   Tobacco abuse  -cessation counseling provided -continue nicotine patch   HLD -continue statins  Protonix -continue PPI  Noncompliance with appointments and lack of insurance, very limited and poor insight into medical conditions.  -case manager consulted -Education with teach back about insurance, medications, and health problems -discussion about importance of compliance and needs for him to follow with the doctors appointments    Code Status: DNR Family Communication: no family at bedside Disposition Plan: home with resumption of home services; but will determine close to be medically stable in case he is more deconditioned    Consultants:  None   Procedures:  See below for x-ray reports   Antibiotics:  levaquin 5/21  HPI/Subjective: Denies CP, SOB with improvement. No nausea, no vomiting and no fever. CBG's in the 300 something range  Objective: Filed Vitals:   03/29/15 2133  BP: 123/64  Pulse: 92  Temp: 97.6 F (36.4 C)  Resp: 20    Intake/Output Summary (Last 24 hours) at 03/29/15 2321 Last data filed at 03/29/15 2133  Gross per 24 hour  Intake   3874 ml  Output    550 ml  Net   3324 ml   Filed Weights   03/27/15 0510 03/28/15 0429 03/29/15 0335  Weight: 73.8 kg (162 lb 11.2 oz) 77.1 kg (169 lb 15.6 oz) 79.1 kg (174 lb 6.1 oz)    Exam:   General:  No fever, reports breathing and coughing spells are much better. No CP. CBG's running high.  No nausea or vomiting   Cardiovascular: S1 and  S2, no rubs or gallops  Respiratory: improve air movement, diffuse rhonchi, positive exp wheezing (but less prominent), no crackles  Abdomen: soft, NT, ND, positive BS  Musculoskeletal: no joint swelling, no  edema  Skin: positive healing ulcers on the lateral aspects of both of his feet (which are chronic), no evidence of superimposed infection  Data Reviewed: Basic Metabolic Panel:  Recent Labs Lab 03/26/15 1518 03/27/15 0544 03/28/15 0435 03/29/15 0425  NA 138 130* 130* 132*  K 3.9 4.0 4.6 4.6  CL 102 96* 97* 101  CO2 GLUCOSE 197* 485* 306* 254*  BUN 10 18 24* 26*  CREATININE 1.23 1.54* 1.38* 1.23  CALCIUM 8.8* 7.9* 7.9* 8.3*   CBC:  Recent Labs Lab 03/26/15 1518 03/27/15 0544 03/28/15 0435 03/29/15 0425  WBC 6.3 3.9* 9.6 10.3  HGB 13.2 12.1* 11.3* 11.7*  HCT 40.6 36.8* 34.3* 35.8*  MCV 90.8 89.1 88.9 91.1  PLT 191 170 164 179   Cardiac Enzymes:  Recent Labs Lab 03/26/15 1910 03/26/15 2325 03/27/15 0544  TROPONINI 0.06* 0.05* 0.07*   BNP (last 3 results)  Recent Labs  02/08/15 1502 02/24/15 1911 03/26/15 1516  BNP 1355.4* 1739.5* 1712.3*    ProBNP (last 3 results)  Recent Labs  08/12/14 1756  PROBNP 7427.0*    CBG:  Recent Labs Lab 03/28/15 2129 03/29/15 0619 03/29/15 1147 03/29/15 1648 03/29/15 2119  GLUCAP 286* 276* 269* 331* 289*    Recent Results (from the past 240 hour(s))  Blood culture (routine x 2)     Status: None (Preliminary result)   Collection Time: 03/26/15  4:00 PM  Result Value Ref Range Status   Specimen Description BLOOD RIGHT ARM  Final   Special Requests BOTTLES DRAWN AEROBIC AND ANAEROBIC  Final   Culture   Final           BLOOD CULTURE RECEIVED NO GROWTH TO DATE CULTURE WILL BE HELD FOR 5 DAYS BEFORE ISSUING A FINAL NEGATIVE REPORT Performed at [facility]    Report Status PENDING  Incomplete  Blood culture (routine x 2)     Status: None (Preliminary result)   Collection Time: 03/26/15  4:15 PM  Result Value Ref Range Status   Specimen Description BLOOD LEFT ARM  Final   Special Requests BOTTLES DRAWN AEROBIC AND ANAEROBIC 10CC  Final   Culture   Final           BLOOD CULTURE  RECEIVED NO GROWTH TO DATE CULTURE WILL BE HELD FOR 5 DAYS BEFORE ISSUING A FINAL NEGATIVE REPORT Performed at [facility]    Report Status PENDING  Incomplete     Studies: No results found.  Scheduled Meds: . arformoterol  15 mcg Nebulization BID  . aspirin  81 mg Oral Daily  . atorvastatin  80 mg Oral q1800  . benzonatate  200 mg Oral BID  . budesonide (PULMICORT) nebulizer solution  0.25 mg Nebulization BID  . carvedilol  6.25 mg Oral BID WC  . chlorpheniramine-HYDROcodone  5 mL Oral Q12H  . collagenase   Topical Daily  . feeding supplement (GLUCERNA SHAKE)  237 mL Oral BID BM  . ferrous sulfate  325 mg Oral BID WC  . fluticasone  2 spray Each Nare Daily  . gabapentin  300 mg Oral TID  . hydrALAZINE  10 mg Oral TID  . insulin aspart  0-5 Units Subcutaneous QHS  . insulin aspart  0-9 Units Subcutaneous TID WC  . [  START ON 03/30/2015] insulin glargine  25 Units Subcutaneous QHS  . ipratropium-albuterol  3 mL Nebulization TID  . isosorbide mononitrate  60 mg Oral Daily  . levofloxacin  750 mg Oral QHS  . loratadine  10 mg Oral Daily  . methylPREDNISolone (SOLU-MEDROL) injection  40 mg Intravenous Q12H  . nicotine  21 mg Transdermal Daily  . pantoprazole  40 mg Oral Daily  . sodium chloride  3 mL Intravenous Q12H   Continuous Infusions: . sodium chloride Stopped (03/29/15 1932)    Principal Problem:   COPD with acute exacerbation Active Problems:   HTN (hypertension)   Peripheral neuropathy   Type II diabetes mellitus with renal manifestations, uncontrolled   Elevated troponin   Cough, persistent   Type 2 diabetes mellitus with diabetic chronic kidney disease   COPD (chronic obstructive pulmonary disease)    Time spent: 35 minutes    Vassie LollMadera, Timur Nibert  [facility] Pager [phone]. If 7PM-7AM, please contact night-coverage at [email or web address], password [email or web address] 03/29/2015, 11:21 PM  LOS: 3 days

## 2015-03-30 DIAGNOSIS — I10 Essential (primary) hypertension: Secondary | ICD-10-CM

## 2015-03-30 DIAGNOSIS — N189 Chronic kidney disease, unspecified: Secondary | ICD-10-CM

## 2015-03-30 DIAGNOSIS — E1122 Type 2 diabetes mellitus with diabetic chronic kidney disease: Secondary | ICD-10-CM

## 2015-03-30 DIAGNOSIS — J441 Chronic obstructive pulmonary disease with (acute) exacerbation: Principal | ICD-10-CM

## 2015-03-30 LAB — CBC
HCT: 35.2 % — ABNORMAL LOW (ref 39.0–52.0)
HEMOGLOBIN: 11.3 g/dL — AB (ref 13.0–17.0)
MCH: 29.1 pg (ref 26.0–34.0)
MCHC: 32.1 g/dL (ref 30.0–36.0)
MCV: 90.7 fL (ref 78.0–100.0)
Platelets: 158 10*3/uL (ref 150–400)
RBC: 3.88 MIL/uL — ABNORMAL LOW (ref 4.22–5.81)
RDW: 16.8 % — ABNORMAL HIGH (ref 11.5–15.5)
WBC: 11.1 10*3/uL — ABNORMAL HIGH (ref 4.0–10.5)

## 2015-03-30 LAB — BASIC METABOLIC PANEL
ANION GAP: 10 (ref 5–15)
BUN: 28 mg/dL — ABNORMAL HIGH (ref 6–20)
CO2: 24 mmol/L (ref 22–32)
CREATININE: 1.27 mg/dL — AB (ref 0.61–1.24)
Calcium: 8.6 mg/dL — ABNORMAL LOW (ref 8.9–10.3)
Chloride: 101 mmol/L (ref 101–111)
GFR calc Af Amer: >60 mL/min (ref >60)
GFR calc non Af Amer: 55 mL/min — ABNORMAL LOW (ref >60)
Glucose, Bld: 219 mg/dL — ABNORMAL HIGH (ref 65–99)
POTASSIUM: 5.3 mmol/L — AB (ref 3.5–5.1)
SODIUM: 135 mmol/L (ref 135–145)

## 2015-03-30 LAB — GLUCOSE, CAPILLARY
Glucose-Capillary: 216 mg/dL — ABNORMAL HIGH (ref 65–99)
Glucose-Capillary: 252 mg/dL — ABNORMAL HIGH (ref 65–99)
Glucose-Capillary: 197 mg/dL — ABNORMAL HIGH (ref 65–99)
Glucose-Capillary: 257 mg/dL — ABNORMAL HIGH (ref 65–99)

## 2015-03-30 MED ORDER — SODIUM POLYSTYRENE SULFONATE 15 GM/60ML PO SUSP
15.0000 g | Freq: Once | ORAL | Status: AC
  Administered 2015-03-30: 15 g via ORAL
  Filled 2015-03-30: qty 60

## 2015-03-30 MED ORDER — PREDNISONE 50 MG PO TABS
50.0000 mg | ORAL_TABLET | Freq: Every day | ORAL | Status: DC
  Administered 2015-03-30 – 2015-04-01 (×3): 50 mg via ORAL
  Filled 2015-03-30 (×4): qty 1

## 2015-03-30 NOTE — Progress Notes (Signed)
Inpatient Diabetes Program Recommendations  AACE/ADA: New Consensus Statement on Inpatient Glycemic Control (2013)  Target Ranges:  Prepandial:   less than 140 mg/dL      Peak postprandial:   less than 180 mg/dL (1-2 hours)      Critically ill patients:  140 - 180 mg/dL   Inpatient Diabetes Program Recommendations Correction (SSI): Increase to resistant scale during steroid therapy Insulin - Meal Coverage: consider addition of meal coverage dose TID Thank you  Piedad ClimesGina Emera Bussie BSN, RN,CDE Inpatient Diabetes Coordinator [phone] (team pager)

## 2015-03-30 NOTE — Hospital Discharge Follow-Up (Signed)
Transitional Care Clinic Care Coordination Note:  Admit date:  03/26/2015  Discharge date: TBD ? 04/01/2015   Discharge Disposition: home  Patient contact:  [phone] Emergency contact(s): Kenney Houseman ( niece)   This Case Manager reviewed patient's EMR and determined patient would benefit from post-discharge medical management and chronic care management services through the [facility]. Patient has a history of COPD, CHF, DM, CAD. This Case Manager met with patient to discuss the services and medical management that can be provided at the [facility]. Patient verbalized understanding and agreed to receive post-discharge care at the [facility].   Patient scheduled for Transitional Care appointment on 04/05/2015 at 2:30pm w/ Dr Jarold Song.  Clinic information and appointment time provided to patient. Appointment information also placed on AVS.  Assessment:       Home Environment: Pt stated that he lives alone in an apt.       Support System: His main support is his niece in addition to multiple nephews.       Level of functioning: Independent prior admission.        Home DME: cane        Home care services: TBD.  He said that he is interested in  Home care svc.        Transportation:Patient usually relies on his niece for transportation.  He stated that he may need assist w/ transportation to his medical appt on 04/05/15. Will follow up w/ member after d/c to determine if transportation is needed and will arrange for member if needed.         Food/Nutrition: Patient said that he does his own meal prep and his niece provides transportation to the grocery store.         Medications: He stated that he used multiple pharmacies and spoke about the high copay for his inhalers. Discussed with patient his option of having his prescriptions filled at [place]. Discussed pharmacy resources available at the [facility].  Patient indicates he does not have a glucometer and supplies  for testing his blood sugar at home. A prescription will be needed for the meter and supplies at discharge if he is to monitor his blood sugars at home.        Identified Barriers: Patient has [employer] from [place] and this limits his coverage for home care.  Discussed contacting [employer] to switch to a traditional medicare plan or a local advantage plan.         PCP: none  Patient Education: Discussed the services of the transitional care program, the importance of follow up w/ a PCP, medication compliance and the need to have an insurance plan w/ local coverage.             Arranged services:  TBD at discharge.

## 2015-03-30 NOTE — Progress Notes (Signed)
[facility] PROGRESS NOTE   Assessment/Plan: Acute respiratory failure with hypoxia due to acute  COPD (chronic obstructive pulmonary disease) - Continue his taper steroids, continue budesonide and brovona on a twice a day. - Continue Levaquin, and renal oxygen.  Chronic systolic heart failure: Last 2-D echo on 02/27/2015 showed an EF of 30% with diffuse hypokinesia. No events on telemetry, continue daily weights and strict I's and O's. No wheezes spironolactone due to chronic renal disease. Continue beta blocker. - no events on telemetry. - Holding lasix due to elevated lactic acid and mild dehydration.  Type 2 diabetes mellitus with diabetic chronic kidney disease - A1c 9.3 on 02/26/2015 -He has not been taking his oral medications as prescribed -Will continue SSI and HS insulin; also lantus while inpatient   Essential HTN: - Well control while on medications,Not COMPLAINCE. - Continue hydralazine and BB  Chronic kidney disease stage 3: - baseline creatinine 1.3 due to diabetes and high blood pressure - avoid nephrotoxic medications. - will follow renal function   Iron deficiency anemia; - Hemoglobin at baseline of 12-13 - Iron studies consistent with iron deficiency, B12 773 , folate 8.3, TSH 1.162 - Started on ferrous sulfate - Will need outpatient follow up with gastroenterology for colonoscopy - No active bleeding currently    Code Status: full Family Communication: none  Disposition Plan: home in 1-2 days   Consultants:  none  Procedures:  CXR  Antibiotics:  levaquin  HPI/Subjective: Relates he feels about the same.  Objective: Filed Vitals:   03/29/15 2055 03/29/15 2133 03/30/15 0425 03/30/15 0849  BP:  123/64 133/90 114/71  Pulse:  92 79 83  Temp:  97.6 F (36.4 C) 97.6 F (36.4 C)   TempSrc:  Oral Oral   Resp:  Weight:   80.9 kg (178 lb 5.6 oz)   SpO2: 100% 95% 100% 97%    Intake/Output Summary (Last 24 hours) at  03/30/15 1325 Last data filed at 03/30/15 1100  Gross per 24 hour  Intake   1447 ml  Output    350 ml  Net   1097 ml   Filed Weights   03/28/15 0429 03/29/15 0335 03/30/15 0425  Weight: 77.1 kg (169 lb 15.6 oz) 79.1 kg (174 lb 6.1 oz) 80.9 kg (178 lb 5.6 oz)    Exam:  General: Alert, awake, oriented x3, in no acute distress.  HEENT: No bruits, no goiter.  Heart: Regular rate and rhythm, without murmurs, rubs, gallops.  Lungs: Good air movement, bilateral air movement.  Abdomen: Soft, nontender, nondistended, positive bowel sounds.  Neuro: Grossly intact, nonfocal.   Data Reviewed: Basic Metabolic Panel:  Recent Labs Lab 03/26/15 1518 03/27/15 0544 03/28/15 0435 03/29/15 0425 03/30/15 0354  NA 138 130* 130* 132* 135  K 3.9 4.0 4.6 4.6 5.3*  CL 102 96* 97* 101 101  CO2 GLUCOSE 197* 485* 306* 254* 219*  BUN 10 18 24* 26* 28*  CREATININE 1.23 1.54* 1.38* 1.23 1.27*  CALCIUM 8.8* 7.9* 7.9* 8.3* 8.6*   Liver Function Tests: No results for input(s): AST, ALT, ALKPHOS, BILITOT, PROT, ALBUMIN in the last 168 hours. No results for input(s): LIPASE, AMYLASE in the last 168 hours. No results for input(s): AMMONIA in the last 168 hours. CBC:  Recent Labs Lab 03/26/15 1518 03/27/15 0544 03/28/15 0435 03/29/15 0425 03/30/15 0354  WBC 6.3 3.9* 9.6 10.3 11.1*  HGB 13.2 12.1* 11.3* 11.7* 11.3*  HCT 40.6  36.8* 34.3* 35.8* 35.2*  MCV 90.8 89.1 88.9 91.1 90.7  PLT 191 170 164 179 158   Cardiac Enzymes:  Recent Labs Lab 03/26/15 1910 03/26/15 2325 03/27/15 0544  TROPONINI 0.06* 0.05* 0.07*   BNP (last 3 results)  Recent Labs  02/08/15 1502 02/24/15 1911 03/26/15 1516  BNP 1355.4* 1739.5* 1712.3*    ProBNP (last 3 results)  Recent Labs  08/12/14 1756  PROBNP 7427.0*    CBG:  Recent Labs Lab 03/29/15 1147 03/29/15 1648 03/29/15 2119 03/30/15 0655 03/30/15 1126  GLUCAP 269* 331* 289* 216* 252*    Recent Results (from the past  240 hour(s))  Blood culture (routine x 2)     Status: None (Preliminary result)   Collection Time: 03/26/15  4:00 PM  Result Value Ref Range Status   Specimen Description BLOOD RIGHT ARM  Final   Special Requests BOTTLES DRAWN AEROBIC AND ANAEROBIC 10ML  Final   Culture   Final           BLOOD CULTURE RECEIVED NO GROWTH TO DATE CULTURE WILL BE HELD FOR 5 DAYS BEFORE ISSUING A FINAL NEGATIVE REPORT Performed at [facility]    Report Status PENDING  Incomplete  Blood culture (routine x 2)     Status: None (Preliminary result)   Collection Time: 03/26/15  4:15 PM  Result Value Ref Range Status   Specimen Description BLOOD LEFT ARM  Final   Special Requests BOTTLES DRAWN AEROBIC AND ANAEROBIC 10CC  Final   Culture   Final           BLOOD CULTURE RECEIVED NO GROWTH TO DATE CULTURE WILL BE HELD FOR 5 DAYS BEFORE ISSUING A FINAL NEGATIVE REPORT Performed at [facility]    Report Status PENDING  Incomplete     Studies: No results found.  Scheduled Meds: . arformoterol  15 mcg Nebulization BID  . aspirin  81 mg Oral Daily  . atorvastatin  80 mg Oral q1800  . benzonatate  200 mg Oral BID  . budesonide (PULMICORT) nebulizer solution  0.25 mg Nebulization BID  . carvedilol  6.25 mg Oral BID WC  . chlorpheniramine-HYDROcodone  5 mL Oral Q12H  . collagenase   Topical Daily  . feeding supplement (GLUCERNA SHAKE)  237 mL Oral BID BM  . ferrous sulfate  325 mg Oral BID WC  . fluticasone  2 spray Each Nare Daily  . gabapentin  300 mg Oral TID  . hydrALAZINE  10 mg Oral TID  . insulin aspart  0-5 Units Subcutaneous QHS  . insulin aspart  0-9 Units Subcutaneous TID WC  . insulin glargine  25 Units Subcutaneous QHS  . ipratropium-albuterol  3 mL Nebulization TID  . isosorbide mononitrate  60 mg Oral Daily  . levofloxacin  750 mg Oral QHS  . loratadine  10 mg Oral Daily  . methylPREDNISolone (SOLU-MEDROL) injection  40 mg Intravenous Q12H  . nicotine  21 mg Transdermal  Daily  . pantoprazole  40 mg Oral Daily  . sodium chloride  3 mL Intravenous Q12H   Continuous Infusions: . sodium chloride Stopped (03/29/15 1932)    Time Spent: 25 min   Marinda ElkFELIZ ORTIZ, Ngoc Daughtridge  [facility] Pager [phone]. If 7PM-7AM, please contact night-coverage at [email or web address], password [email or web address] 03/30/2015, 1:25 PM  LOS: 4 days

## 2015-03-30 NOTE — Progress Notes (Signed)
Physical Therapy Evaluation Patient Details Name: Russell Hamilton MRN: [id number] DOB: 10/08/1941 Today's Date: 03/30/2015   History of Present Illness  74 y.o. male admitted with Acute respiratory failure with hypoxia due to acute COPD. Hx of CAD, chronic kidney disease, DM II, and HTN.  Clinical Impression  Pt admitted with the above complications. Pt currently with functional limitations due to the deficits listed below (see PT Problem List). Pt complained of severe dizziness with positional changes (began 2 weeks ago.) Required frequent assistance to prevent falls while ambulating with a rolling walker. Patient tested positive for right posterior canalithiasis with dix-hallpike test. Treated with Epley's maneuver and progressed to a min guard level for ambulation, reporting significant improvement in his dizziness symptoms. SpO2 did drop to 84% while ambulating on room air and required 2L supplemental O2 with rest to improve sats to mid 90s. Patient has limited family support in the area but states family can check in on him if needed. We will continue to monitor his progression and update recommendations for d/c planning as appropriate. Pt will benefit from skilled PT to increase their independence and safety with mobility to allow discharge to the venue listed below.       Follow Up Recommendations Home health PT    Equipment Recommendations   (To be determined based on progression (likely RW))    Recommendations for Other Services OT consult     Precautions / Restrictions Precautions Precautions: Fall Restrictions Weight Bearing Restrictions: No      Mobility  Bed Mobility Overal bed mobility: Needs Assistance Bed Mobility: Supine to Sit;Sit to Supine     Supine to sit: Min guard Sit to supine: Min guard   General bed mobility comments: Min guard for safety. Pt complains of severe dizziness with positional changes. Vc for technique and use of rail.  Transfers Overall transfer  level: Needs assistance Equipment used: Rolling walker (2 wheeled) Transfers: Sit to/from Stand Sit to Stand: Min guard         General transfer comment: Min guard for safety. VC for hand placement performed x2, pre and post epley's maneuver. Reaching for furniture in room during first attempt, appears more stable during second stand.  Ambulation/Gait Ambulation/Gait assistance: Min assist Ambulation Distance (Feet): 60 Feet (x2) Assistive device: Rolling walker (2 wheeled) Gait Pattern/deviations: Step-through pattern;Decreased stride length;Drifts right/left;Staggering left;Staggering right;Trunk flexed Gait velocity: decreased   General Gait Details: Patient required min assist frequently for loss of balance prior to having epley's maneuver performed. Reports increased dizziness with vertical head turns. Required to stop and rest due to dizziness. Second attempt, following epley's maneuver, pt performed at a min guard level, still demonstrating mild drift but able to self correct. VC for walker placement throughout gait training for proximity and support as needed. SpO2 did drop to mid 80s (84% on room air with short distance ambulation- returns to 90s when supplemental O2 reapplied)  Stairs            Wheelchair Mobility    Modified Rankin (Stroke Patients Only)       Balance Overall balance assessment: Needs assistance Sitting-balance support: No upper extremity supported;Feet supported Sitting balance-Leahy Scale: Good     Standing balance support: Bilateral upper extremity supported Standing balance-Leahy Scale: Poor                               Pertinent Vitals/Pain Pain Assessment: No/denies pain    Home Living  Family/patient expects to be discharged to:: Private residence Living Arrangements: Alone Available Help at Discharge: Family;Available PRN/intermittently Type of Home: House Home Access: Stairs to enter Entrance Stairs-Rails:  Left Entrance Stairs-Number of Steps: 8 Home Layout: Two level;Able to live on main level with bedroom/bathroom Home Equipment: Gilmer Mor - single point      Prior Function Level of Independence: Independent with assistive device(s)         Comments: Uses SPC      Hand Dominance   Dominant Hand: Right    Extremity/Trunk Assessment   Upper Extremity Assessment: Defer to OT evaluation           Lower Extremity Assessment: Overall WFL for tasks assessed (bandage Lt foot - states an ulcer ruptured.)         Communication   Communication: No difficulties  Cognition Arousal/Alertness: Awake/alert Behavior During Therapy: WFL for tasks assessed/performed Overall Cognitive Status: Within Functional Limits for tasks assessed                      General Comments General comments (skin integrity, edema, etc.): Patient reporting increased dizziness with head position changes. Positive for Rt posterior canal BPPV during dix-hallpike. Performed Epley's maneuver. Pt reports significant improvement in symptoms. Educated on basis for performing this test and procedure. pt verbalizes understanding and is very Adult nurse. Symptoms not exacerabated by x1 viewing post epley's therefore not educated on adaptation or habituation exercises. ( Patient reports dizziness worst at 10/10, Best at 6/10)    Exercises        Assessment/Plan    PT Assessment Patient needs continued PT services  PT Diagnosis Difficulty walking;Abnormality of gait   PT Problem List Decreased activity tolerance;Decreased balance;Decreased mobility;Decreased knowledge of use of DME;Decreased coordination;Cardiopulmonary status limiting activity  PT Treatment Interventions Gait training;DME instruction;Stair training;Functional mobility training;Therapeutic activities;Therapeutic exercise;Balance training;Patient/family education   PT Goals (Current goals can be found in the Care Plan section) Acute Rehab PT  Goals Patient Stated Goal: Go home PT Goal Formulation: With patient Time For Goal Achievement: 04/13/15 Potential to Achieve Goals: Good Additional Goals Additional Goal #1: Pt report dizziness at worst <6/10    Frequency Min 3X/week   Barriers to discharge Decreased caregiver support lives alone    Co-evaluation               End of Session Equipment Utilized During Treatment: Gait belt;Oxygen Activity Tolerance: Patient tolerated treatment well Patient left: in bed;with call bell/phone within reach;with bed alarm set;with SCD's reapplied Nurse Communication: Mobility status         Time: 4098-1191 PT Time Calculation (min) (ACUTE ONLY): 42 min   Charges:   PT Evaluation $Initial PT Evaluation Tier I: 1 Procedure PT Treatments $Gait Training: 8-22 mins $Canalith Rep Proc: 8-22 mins   PT G Codes:        Berton Mount 03/30/2015, 6:42 PM [place], [place] [phone]

## 2015-03-30 NOTE — Care Management Note (Signed)
Case Management Note  Patient Details  Name: Russell Hamilton MRN: [id number] Date of Birth: 01/22/1941  Subjective/Objective: Pt admitted with COPD                   Action/Plan: PTA pt lived at home alone, has had multiple admissions, due to his insurance plan [name] [name] as his insurance is a [name] plan and is out of network with service providers here- [name] - last seen there in Feb. 2016.   Expected Discharge Date:                  Expected Discharge Plan:  Home/Self Care  In-House Referral:     Discharge planning Services  CM Consult, Follow-up appt scheduled  Post Acute Care Choice:    Choice offered to:     DME Arranged:    DME Agency:     HH Arranged:    HH Agency:     Status of Service:  In process, will continue to follow  Medicare Important Message Given:  Yes Date Medicare IM Given:  03/30/15 Medicare IM give by:  Donn PieriniWebster, Edom Schmuhl Date Additional Medicare IM Given:    Additional Medicare Important Message give by:     If discussed at Long Length of Stay Meetings, dates discussed:  03/31/15  Additional Comments: 03/29/15- [name]- pt has had over 3 missed or canceled appointments- and will need approval from medical director there to have f/u in clinic- Received a return call from [name] who did approve pt to be seen at the Transitional Care clinic appointment set for May 31 at 2:30.The clinic CM will come see pt prior to discharge to encourage f/u with appointment. Have also [facility] [name] [name] however pt isnot eligible for services due to his out of network provider. NCM following for possible 02 needs- but unsure if he would be able to get home 02 due to his insurance issues- have spoken to pt regarding changing his insurance to a [place] provider- he states that his niece is working with him on that.   Darrold SpanWebster, Amaury Kuzel Hall, RN 03/30/2015, 2:55 PM

## 2015-03-31 DIAGNOSIS — J440 Chronic obstructive pulmonary disease with acute lower respiratory infection: Secondary | ICD-10-CM

## 2015-03-31 DIAGNOSIS — E1129 Type 2 diabetes mellitus with other diabetic kidney complication: Secondary | ICD-10-CM

## 2015-03-31 DIAGNOSIS — E1165 Type 2 diabetes mellitus with hyperglycemia: Secondary | ICD-10-CM

## 2015-03-31 LAB — BASIC METABOLIC PANEL
Anion gap: 6 (ref 5–15)
BUN: 28 mg/dL — AB (ref 6–20)
CALCIUM: 8.3 mg/dL — AB (ref 8.9–10.3)
CHLORIDE: 101 mmol/L (ref 101–111)
CO2: 23 mmol/L (ref 22–32)
Creatinine, Ser: 1.17 mg/dL (ref 0.61–1.24)
Glucose, Bld: 294 mg/dL — ABNORMAL HIGH (ref 65–99)
Potassium: 4.4 mmol/L (ref 3.5–5.1)
Sodium: 130 mmol/L — ABNORMAL LOW (ref 135–145)

## 2015-03-31 LAB — CBC
HEMATOCRIT: 37.8 % — AB (ref 39.0–52.0)
Hemoglobin: 12.1 g/dL — ABNORMAL LOW (ref 13.0–17.0)
MCH: 28.6 pg (ref 26.0–34.0)
MCHC: 32 g/dL (ref 30.0–36.0)
MCV: 89.4 fL (ref 78.0–100.0)
Platelets: 158 10*3/uL (ref 150–400)
RBC: 4.23 MIL/uL (ref 4.22–5.81)
RDW: 16.5 % — ABNORMAL HIGH (ref 11.5–15.5)
WBC: 9.5 10*3/uL (ref 4.0–10.5)

## 2015-03-31 LAB — GLUCOSE, CAPILLARY
GLUCOSE-CAPILLARY: 278 mg/dL — AB (ref 65–99)
GLUCOSE-CAPILLARY: 264 mg/dL — AB (ref 65–99)
Glucose-Capillary: 267 mg/dL — ABNORMAL HIGH (ref 65–99)
Glucose-Capillary: 211 mg/dL — ABNORMAL HIGH (ref 65–99)

## 2015-03-31 MED ORDER — LEVOFLOXACIN 750 MG PO TABS: 750.0000 mg | ORAL_TABLET | Freq: Every day | ORAL | Status: DC

## 2015-03-31 NOTE — Progress Notes (Signed)
Physical Therapy Treatment Patient Details Name: Russell MathJames Ferrington MRN: [id number] DOB: 12/31/1940 Today's Date: 03/31/2015    History of Present Illness 74 y.o. male admitted with Acute respiratory failure with hypoxia due to acute COPD. Hx of CAD, chronic kidney disease, DM II, and HTN.    PT Comments    Pt moving much better today and able to walk with his cane without loss of balance or unsteadiness (after repeat Epley's maneuver due to vertigo with supine to sit). Again required 2L O2 when ambulating (see O2 qualification note, separate entry). Provided education re: BPPV and possible recurrence. Pt agreeable to OPPT and can have family member drive him.    Follow Up Recommendations  Outpatient PT;Supervision - Intermittent (OPPT for vestibular rehab)     Equipment Recommendations  None recommended by PT    Recommendations for Other Services       Precautions / Restrictions Precautions Precautions: Fall Restrictions Weight Bearing Restrictions: No    Mobility  Bed Mobility Overal bed mobility: Needs Assistance Bed Mobility: Sit to Supine;Rolling;Sidelying to Sit Rolling: Supervision Sidelying to sit: Supervision Supine to sit: Supervision     General bed mobility comments: supervision with + vertigo initially; after Epley's maneuver for Rt Post canal BPPV no further spinning  Transfers Overall transfer level: Needs assistance Equipment used: Rolling walker (2 wheeled);Straight cane Transfers: Sit to/from Stand Sit to Stand: Min guard         General transfer comment: Min guard for safety. no imbalance  Ambulation/Gait Ambulation/Gait assistance: Min guard;Supervision Ambulation Distance (Feet): 300 Feet Assistive device: Rolling walker (2 wheeled);Straight cane Gait Pattern/deviations: WFL(Within Functional Limits) Gait velocity: decreased   General Gait Details: After Epley's no imbalance noted, even with transition to straight cane (as pt used  PTA)   Stairs            Wheelchair Mobility    Modified Rankin (Stroke Patients Only)       Balance Overall balance assessment: Needs assistance   Sitting balance-Leahy Scale: Good     Standing balance support: No upper extremity supported Standing balance-Leahy Scale: Fair                      Cognition Arousal/Alertness: Awake/alert Behavior During Therapy: WFL for tasks assessed/performed Overall Cognitive Status: Within Functional Limits for tasks assessed                      Exercises Other Exercises Other Exercises: Instructed to sit up x 1 hour and no sudden head turns for 24 hours. Handout provided.    General Comments General comments (skin integrity, edema, etc.): Pt reported some vertigo after treatment for BPPV 5/25. Assessed Rt and Lt Hallpike-Dix and Rt, Lt supine head roll with vertigo only elicited with returning head to neutral from Lt supine head roll. Vertigo again elicited with supine to sit to Rt side of bed. Performed Epley for Rt posterior canal BPPV with no vertigo in first 3 positions, + upon sitting EOB (resolved in <10 seconds with no nystagmus appreciable). Able to perform head turns in sitting, standing, and while walking with no further vertigo      Pertinent Vitals/Pain Pain Assessment: No/denies pain    Home Living                      Prior Function            PT Goals (current goals can now be found in  the care plan section) Acute Rehab PT Goals Patient Stated Goal: Go home PT Goal Formulation: With patient Time For Goal Achievement: 04/13/15 Potential to Achieve Goals: Good Progress towards PT goals: Progressing toward goals    Frequency  Min 3X/week    PT Plan Discharge plan needs to be updated    Co-evaluation             End of Session Equipment Utilized During Treatment: Gait belt;Oxygen Activity Tolerance: Patient tolerated treatment well Patient left: with call bell/phone  within reach;in chair;with chair alarm set     Time: 4696-2952 PT Time Calculation (min) (ACUTE ONLY): 46 min  Charges:  $Gait Training: 8-22 mins $Self Care/Home Management: 8-22 $Canalith Rep Proc: 8-22 mins                    G Codes:      Bram Hottel 24-Apr-2015, 10:39 AM  Pager [phone]

## 2015-03-31 NOTE — Progress Notes (Signed)
Cancelled D/C order per MD order.  Pt insurance is not covering home oxygen that would be necessary at this time, and the pt states he is unable to pay for the oxygen himself.

## 2015-03-31 NOTE — Care Management Note (Signed)
Case Management Note  Patient Details  Name: Nat MathJames Jagodzinski MRN: [id number] Date of Birth: 03/03/1941  Subjective/Objective: Pt admitted with COPD                   Action/Plan: PTA pt lived at home alone, has had multiple admissions, due to his insurance plan [facility] [place] as his [place] is a [name] plan and is out of network with service providers here- [facility] - last seen there in Feb. 2016.   Expected Discharge Date:                  Expected Discharge Plan:  Home/Self Care  In-House Referral:     Discharge planning Services  CM Consult, Follow-up appt scheduled  Post Acute Care Choice:    Choice offered to:     DME Arranged:  Oxygen [facility]  HH Arranged:    HH Agency:     Status of Service:  Completed, signed off  Medicare Important Message Given:  Yes Date Medicare IM Given:  03/30/15 Medicare IM give by:  Donn PieriniWebster, Kerman Pfost Date Additional Medicare IM Given:    Additional Medicare Important Message give by:     If discussed at Long Length of Stay Meetings, dates discussed:  03/31/15  Additional Comments:  03/31/15- 1700-  Per [facility] pt  Was out of network and they were unable to bill for home 02-As [place] plan called [facility] to see if they where in-network [place]- per Fayrene FearingJames with [facility]- they are in-network for Medicare plans in [place] and should be able to provide home 02- faxed needed paperwork to [facility]- per Fayrene FearingJames it will take several hours to process before they can deliver a portable tank to pt's room and they will need to call pt regarding any co-pay cost- notified pt's bedside RN Brittney of plan for 02. [facility] given pt's unit room # for contact.   03/31/15- 1430- pt for d/c home today- needs home 02- however- due to pt's insurance being a out of state provider- pt's insurance will not cover home 02 here- also [facility] or [facility] therapy due to insurance limitations- spoke  with pt at bedside- pt does not want to pay out of pocket for home 02- he verbalized appointment next Tues with the transitional care clinic- and states that he will f/u so that they can assist him with helping him change his insurance among other things so that he can get the f/u that he needs.  03/29/15- Spoke with [facility] regarding pt- pt has had over 3 missed or canceled appointments- and will need approval from medical director there to have f/u in clinic- Received a return call from [place] who did approve pt to be seen at the Transitional Care clinic appointment set for May 31 at 2:30.The clinic CM will come see pt prior to discharge to encourage f/u with appointment. Have also [facility] with [name] [facility] however pt isnot eligible for services due to his out of network provider. NCM following for possible 02 needs- but unsure if he would be able to get home 02 due to his insurance issues- have spoken to pt regarding changing his insurance to a [place] provider- he states that his niece is working with him on that.    Donn PieriniWebster, Akaisha Truman WaverlyHall, RN- [phone] 03/31/2015, 5:21 PM

## 2015-03-31 NOTE — Discharge Summary (Addendum)
Physician Discharge Summary  Russell Hamilton ZOX:[id number] DOB: [birth year] DOA: 03/26/2015  PCP: Jaclyn Shaggy, MD  Admit date: 03/26/2015 Discharge date: 03/31/2015  Time spent: 35 minutes  Recommendations for Outpatient Follow-up:  1. Follow-up with PCP as an outpatient  Discharge Diagnoses:  Principal Problem:   COPD with acute exacerbation Active Problems:   HTN (hypertension)   Peripheral neuropathy   Type II diabetes mellitus with renal manifestations, uncontrolled   Elevated troponin   Cough, persistent   Type 2 diabetes mellitus with diabetic chronic kidney disease   COPD (chronic obstructive pulmonary disease)   Discharge Condition: stable  Diet recommendation: Low sodium fluid restricted  Filed Weights   03/29/15 0335 03/30/15 0425 03/31/15 0454  Weight: 79.1 kg (174 lb 6.1 oz) 80.9 kg (178 lb 5.6 oz) 82.4 kg (181 lb 10.5 oz)    History of present illness:  74 y.o. year-old male with history of chronic systolic and diastolic heart failure, hypertension, diabetes mellitus type 2, CAD based on a CAT scan however the patient has repeatedly previously declined cardiac catheterization despite recurrent chest pains a mildly elevated troponins on previous admissions, limited medical insight, medical noncompliance, lack of insurance. He presents again with shortness of breath. The patient was last admitted from 02/24/2015 through 03/01/2015. He was treated for acute on chronic systolic and diastolic heart failure and COPD exacerbation. The patient states that he still had some mild cough when he was discharged from the hospital however he was able to fill a couple of his prescriptions which he states he took for approximately one month. He later states that he did not get any of his prescriptions filled because they were too expensive when he took them to the pharmacy. He had been given [facility] most of his prescriptions if  not all of them should have been free, however he states he was charged $100 and therefore did not pick them up. He cannot tell me most of the names of his medications. He states that he continued to have cough at home but his lower extremity swelling continued to decrease as did the pain in his feet. Over the last week he has had increased dyspnea on exertion, cough productive of thick clear to mildly yellow sputum. His cough is frequent and severe and he has had frequent posttussive emesis. Nonbloody, nonbilious emesis. He denies fevers, chills, PND, orthopnea. He continues to smoke approximately half a pack per day.  In the emergency room and, he is afebrile, heart rate in the low 100s, respirations 16 to the mid 30s with very frequent cough, blood pressure mildly elevated. All of his reported oxygen saturations have been normal however at the time of my exam he is wearing 2 L nasal cannula. Labs were normal except for a glucose of 197. His chest x-ray demonstrated patchy bilateral lobe opacities concerning for atelectasis versus pneumonia and possible small bilateral pleural effusions. BNP is proximally stable from previous admission at 1712. His troponin is 0.1, however he continues to decline cardiac catheterization therefore cardiology was not notified. He was given DuoNeb, soluble Medrol, Lasix, and a full dose aspirin and is being admitted for presumptive acute COPD exacerbation with possible acute on chronic systolic heart failure. The emergency department physician felt that his chest x-ray and symptoms were less concerning for pneumonia.  Hospital Course:  Acute respiratory failure with hypoxia due to acute COPD (chronic obstructive pulmonary disease): - He was started on admission  on IV antibiotics, steroids and inhalers.  - Go home taper steroids, continue budesonide and Dulera times a day. - Continue Levaquin for 3 days, 2 week tapered of steroids and home oxygen.  Chronic systolic heart  failure: Last 2-D echo on 02/27/2015 showed an EF of 30% with diffuse hypokinesia. No events on telemetry, continue daily weights and strict I's and O's. - no events on telemetry. No ace-ii or aldactone due to renal failure. - Held lasix due to elevated lactic acid and mild dehydration. Resume as an outpatient.  Type 2 diabetes mellitus with diabetic chronic kidney disease - A1c 9.3 on 02/26/2015 -No changes made to his medications.  Essential HTN: - Well control while on medications,Not COMPLAINCE. - Continue hydralazine and BB  Chronic kidney disease stage 3: - baseline creatinine 1.3 due to diabetes and high blood pressure - avoid nephrotoxic medications. - will follow renal function   Iron deficiency anemia; - Hemoglobin at baseline of 12-13 - Iron studies consistent with iron deficiency, B12 773 , folate 8.3, TSH 1.162 - Started on ferrous sulfate - Will need outpatient follow up with gastroenterology for colonoscopy - No active bleeding currently     Procedures:  CXR  Consultations:  none  Discharge Exam: Filed Vitals:   03/31/15 1404  BP: 146/87  Pulse: 84  Temp: 97.8 F (36.6 C)  Resp: 18    General: A&O x3 Cardiovascular: RRR Respiratory: good air movement CTA B/L  Discharge Instructions   Discharge Instructions    Diet - low sodium heart healthy    Complete by:  As directed      Increase activity slowly    Complete by:  As directed           Current Discharge Medication List    START taking these medications   Details  levofloxacin (LEVAQUIN) 750 MG tablet Take 1 tablet (750 mg total) by mouth at bedtime. Qty: 3 tablet, Refills: 0      CONTINUE these medications which have NOT CHANGED   Details  albuterol (PROVENTIL HFA;VENTOLIN HFA) 108 (90 BASE) MCG/ACT inhaler Inhale 2 puffs into the lungs every 6 (six) hours as needed for wheezing or shortness of breath. Qty: 1 Inhaler, Refills: 0    atorvastatin (LIPITOR) 80 MG tablet Take 1  tablet (80 mg total) by mouth daily at 6 PM. Qty: 30 tablet, Refills: 0    benzonatate (TESSALON) 200 MG capsule Take 1 capsule (200 mg total) by mouth 2 (two) times daily. Qty: 10 capsule, Refills: 0    furosemide (LASIX) 40 MG tablet Take 40 mg by mouth daily. Refills: 0    gabapentin (NEURONTIN) 300 MG capsule Take 1 capsule (300 mg total) by mouth 3 (three) times daily. Qty: 90 capsule, Refills: 0    glimepiride (AMARYL) 2 MG tablet Take 1 tablet (2 mg total) by mouth daily with breakfast. Qty: 30 tablet, Refills: 0    hydrALAZINE (APRESOLINE) 10 MG tablet Take 1 tablet (10 mg total) by mouth 3 (three) times daily. Qty: 90 tablet, Refills: 0    isosorbide mononitrate (IMDUR) 60 MG 24 hr tablet Take 1 tablet (60 mg total) by mouth daily. Qty: 30 tablet, Refills: 0    pantoprazole (PROTONIX) 40 MG tablet Take 1 tablet (40 mg total) by mouth daily. Qty: 30 tablet, Refills: 0    potassium chloride SA (K-DUR,KLOR-CON) 20 MEQ tablet Take 1 tablet (20 mEq total) by mouth daily. Qty: 30 tablet, Refills: 0    traMADol (ULTRAM) 50 MG  tablet Take 1 tablet (50 mg total) by mouth every 6 (six) hours as needed for moderate pain (cough). Qty: 15 tablet, Refills: 0    aspirin 81 MG chewable tablet Chew 1 tablet (81 mg total) by mouth daily. Qty: 30 tablet, Refills: 3    carvedilol (COREG) 6.25 MG tablet Take 1 tablet (6.25 mg total) by mouth 2 (two) times daily with a meal. Qty: 60 tablet, Refills: 0    chlorpheniramine-HYDROcodone (TUSSIONEX) 10-8 MG/5ML LQCR Take 5 mLs by mouth every 12 (twelve) hours. Qty: 115 mL, Refills: 0    collagenase (SANTYL) ointment Apply topically daily. Qty: 15 g, Refills: 0    feeding supplement, GLUCERNA SHAKE, (GLUCERNA SHAKE) LIQD Take 237 mLs by mouth 2 (two) times daily between meals. Qty: 60 Can, Refills: 0    ferrous sulfate 325 (65 FE) MG tablet Take 1 tablet (325 mg total) by mouth 2 (two) times daily with a meal. Qty: 30 tablet, Refills: 0     fluticasone (FLONASE) 50 MCG/ACT nasal spray Place 2 sprays into both nostrils daily. Qty: 9.9 g, Refills: 0    loratadine (CLARITIN) 10 MG tablet Take 1 tablet (10 mg total) by mouth daily. Qty: 30 tablet, Refills: 0    mometasone-formoterol (DULERA) 100-5 MCG/ACT AERO Inhale 2 puffs into the lungs 2 (two) times daily. Qty: 1 Inhaler, Refills: 0    nicotine (NICODERM CQ - DOSED IN MG/24 HOURS) 21 mg/24hr patch Place 1 patch (21 mg total) onto the skin daily. Qty: 28 patch, Refills: 0    predniSONE (DELTASONE) 10 MG tablet Take 4 tablets (40 mg) daily for 2 days, then, Take 3 tablets (30 mg) daily for 2 days, then, Take 2 tablets (20 mg) daily for 2 days, then, Take 1 tablets (10 mg) daily for 1 days, then stop Qty: 19 tablet, Refills: 0    tiotropium (SPIRIVA HANDIHALER) 18 MCG inhalation capsule Place 1 capsule (18 mcg total) into inhaler and inhale daily. Qty: 30 capsule, Refills: 0      STOP taking these medications     aspirin EC 81 MG tablet        No Known Allergies Follow-up Information    Follow up with [facility]     On 04/05/2015.   Why:  [facility]30 phone # for that clinic is [phone]   Contact information:   [address] [place] [id number] [phone]       The results of significant diagnostics from this hospitalization (including imaging, microbiology, ancillary and laboratory) are listed below for reference.    Significant Diagnostic Studies: Dg Chest Port 1 View  03/26/2015   CLINICAL DATA:  Shortness of breath  EXAM: PORTABLE CHEST - 1 VIEW  COMPARISON:  02/27/2015  FINDINGS: Low lung volumes.  Patchy bilateral lower lobe opacities, atelectasis versus pneumonia. Possible small bilateral pleural effusions.  No pneumothorax.  Cardiomegaly.  IMPRESSION: Patchy bilateral lower lobe opacities, atelectasis versus pneumonia.  Possible small  bilateral pleural effusions.   Electronically Signed   By: Charline BillsSriyesh  Krishnan M.D.   On: 03/26/2015 15:24    Microbiology: Recent Results (from the past 240 hour(s))  Blood culture (routine x 2)     Status: None (Preliminary result)   Collection Time: 03/26/15  4:00 PM  Result Value Ref Range Status   Specimen Description BLOOD RIGHT ARM  Final   Special Requests BOTTLES DRAWN AEROBIC AND ANAEROBIC 10ML  Final   Culture   Final           BLOOD CULTURE RECEIVED NO GROWTH TO DATE CULTURE WILL BE HELD FOR 5 DAYS BEFORE ISSUING A FINAL NEGATIVE REPORT Performed at [facility]    Report Status PENDING  Incomplete  Blood culture (routine x 2)     Status: None (Preliminary result)   Collection Time: 03/26/15  4:15 PM  Result Value Ref Range Status   Specimen Description BLOOD LEFT ARM  Final   Special Requests BOTTLES DRAWN AEROBIC AND ANAEROBIC 10CC  Final   Culture   Final           BLOOD CULTURE RECEIVED NO GROWTH TO DATE CULTURE WILL BE HELD FOR 5 DAYS BEFORE ISSUING A FINAL NEGATIVE REPORT Performed at [facility]    Report Status PENDING  Incomplete     Labs: Basic Metabolic Panel:  Recent Labs Lab 03/27/15 0544 03/28/15 0435 03/29/15 0425 03/30/15 0354 03/31/15 0500  NA 130* 130* 132* 135 130*  K 4.0 4.6 4.6 5.3* 4.4  CL 96* 97* 101 101 101  CO2 GLUCOSE 485* 306* 254* 219* 294*  BUN 18 24* 26* 28* 28*  CREATININE 1.54* 1.38* 1.23 1.27* 1.17  CALCIUM 7.9* 7.9* 8.3* 8.6* 8.3*   Liver Function Tests: No results for input(s): AST, ALT, ALKPHOS, BILITOT, PROT, ALBUMIN in the last 168 hours. No results for input(s): LIPASE, AMYLASE in the last 168 hours. No results for input(s): AMMONIA in the last 168 hours. CBC:  Recent Labs Lab 03/27/15 0544 03/28/15 0435 03/29/15 0425 03/30/15 0354 03/31/15 0500  WBC 3.9* 9.6 10.3 11.1* 9.5  HGB 12.1* 11.3* 11.7* 11.3* 12.1*  HCT 36.8* 34.3* 35.8* 35.2* 37.8*  MCV 89.1 88.9 91.1 90.7 89.4   PLT 170 164 179 158 158   Cardiac Enzymes:  Recent Labs Lab 03/26/15 1910 03/26/15 2325 03/27/15 0544  TROPONINI 0.06* 0.05* 0.07*   BNP: BNP (last 3 results)  Recent Labs  02/08/15 1502 02/24/15 1911 03/26/15 1516  BNP 1355.4* 1739.5* 1712.3*    ProBNP (last 3 results)  Recent Labs  08/12/14 1756  PROBNP 7427.0*    CBG:  Recent Labs Lab 03/30/15 1651 03/30/15 2125 03/31/15 0609 03/31/15 1134 03/31/15 1629  GLUCAP 197* 257* 267* 278* 211*       Signed:  Marinda Elk  [facility] 03/31/2015, 5:20 PM

## 2015-03-31 NOTE — Care Management Note (Signed)
Case Management Note  Patient Details  Name: Russell Hamilton MRN: [id number] Date of Birth: 09/16/1941  Subjective/Objective: Pt admitted with COPD                   Action/Plan: PTA pt lived at home alone, has had multiple admissions, due to his insurance plan [facility] [place] as his insurance is a [name] plan and is out of network with service providers here- [facility] - last seen there in Feb. 2016.   Expected Discharge Date:                  Expected Discharge Plan:  Home/Self Care  In-House Referral:     Discharge planning Services  CM Consult, Follow-up appt scheduled  Post Acute Care Choice:    Choice offered to:     DME Arranged:    DME Agency:     HH Arranged:    HH Agency:     Status of Service:  Completed, signed off  Medicare Important Message Given:  Yes Date Medicare IM Given:  03/30/15 Medicare IM give by:  Donn PieriniWebster, Russell Hamilton Date Additional Medicare IM Given:    Additional Medicare Important Message give by:     If discussed at Long Length of Stay Meetings, dates discussed:  03/31/15  Additional Comments:   03/31/15- 1430- pt for d/c home today- needs home 02- however- due to pt's insurance being a out of state provider- pt's insurance will not cover home 02 here- also [facility] or out[facility] therapy due to insurance limitations- spoke with pt at bedside- pt does not want to pay out of pocket for home 02- he verbalized appointment next Tues with the transitional care clinic- and states that he will f/u so that they can assist him with helping him change his insurance among other things so that he can get the f/u that he needs.  03/29/15- Spoke with [facility] regarding pt- pt has had over 3 missed or canceled appointments- and will need approval from medical director there to have f/u in clinic- Received a return call from [place] who did approve pt to be seen at the [facility]itional [facility] clinic appointment set for May 31 at  2:30.The clinic CM will come see pt prior to discharge to encourage f/u with appointment. Have also [facility] with [name] [facility] however pt isnot eligible for services due to his out of network provider. NCM following for possible 02 needs- but unsure if he would be able to get home 02 due to his insurance issues- have spoken to pt regarding changing his insurance to a [place] provider- he states that his niece is working with him on that.    Donn PieriniWebster, [name] Rose HillHall, RN- [phone] 03/31/2015, 2:28 PM

## 2015-03-31 NOTE — Hospital Discharge Follow-Up (Signed)
Transitional Care Clinic Follow-Up:  This RN met with patient at bedside for follow-up. Patient anticipated to discharge today-uncertain if home O2 needed. Reiterated appointment with [facility] on 04/05/15 at 1430. Patient verbalized understanding and indicates he will have transportation to appointment-either niece, Kenney Houseman [phone] Home, [phone] cell) or private pay cab. Patient indicates he is able to afford cab to clinic on 04/05/15.  Discussed with patient that [facility] in [facility] and that clinic has onsite pharmacy to obtain any new discharge medications. Patient also informed that there are multiple pharmacy resources available if unable to afford medications.  Discussed importance of medication compliance and discussed that patient will receive a post-discharge follow-up phone call after discharge. Again, reiterated importance of patient calling [employer] quickly after discharge and discussing need to obtain local [employer] plan or switch to a traditional Medicare plan. Patient verbalized understanding.

## 2015-03-31 NOTE — Progress Notes (Signed)
ANTIBIOTIC CONSULT NOTE - FOLLOW UP  Pharmacy Consult for levaquin Indication: COPD  No Known Allergies  Patient Measurements: Weight: 181 lb 10.5 oz (82.4 kg)   Vital Signs: Temp: 98 F (36.7 C) (05/26 0454) Temp Source: Oral (05/26 0454) BP: 138/65 mmHg (05/26 0454) Pulse Rate: 81 (05/26 0454) Intake/Output from previous day: 05/25 0701 - 05/26 0700 In: 1437 [P.O.:1437] Out: 975 [Urine:975] Intake/Output from this shift:    Labs:  Recent Labs  03/29/15 0425 03/30/15 0354 03/31/15 0500  WBC 10.3 11.1* 9.5  HGB 11.7* 11.3* 12.1*  PLT 179 158 158  CREATININE 1.23 1.27* 1.17   Estimated Creatinine Clearance: 57.9 mL/min (by C-G formula based on Cr of 1.17). No results for input(s): VANCOTROUGH, VANCOPEAK, VANCORANDOM, GENTTROUGH, GENTPEAK, GENTRANDOM, TOBRATROUGH, TOBRAPEAK, TOBRARND, AMIKACINPEAK, AMIKACINTROU, AMIKACIN in the last 72 hours.   Microbiology: Recent Results (from the past 720 hour(s))  Blood culture (routine x 2)     Status: None (Preliminary result)   Collection Time: 03/26/15  4:00 PM  Result Value Ref Range Status   Specimen Description BLOOD RIGHT ARM  Final   Special Requests BOTTLES DRAWN AEROBIC AND ANAEROBIC 10ML  Final   Culture   Final           BLOOD CULTURE RECEIVED NO GROWTH TO DATE CULTURE WILL BE HELD FOR 5 DAYS BEFORE ISSUING A FINAL NEGATIVE REPORT Performed at [facility]    Report Status PENDING  Incomplete  Blood culture (routine x 2)     Status: None (Preliminary result)   Collection Time: 03/26/15  4:15 PM  Result Value Ref Range Status   Specimen Description BLOOD LEFT ARM  Final   Special Requests BOTTLES DRAWN AEROBIC AND ANAEROBIC 10CC  Final   Culture   Final           BLOOD CULTURE RECEIVED NO GROWTH TO DATE CULTURE WILL BE HELD FOR 5 DAYS BEFORE ISSUING A FINAL NEGATIVE REPORT Performed at [facility]    Report Status PENDING  Incomplete    Anti-infectives    Start     Dose/Rate Route  Frequency Ordered Stop   03/31/15 0000  levofloxacin (LEVAQUIN) 750 MG tablet     750 mg Oral Daily at bedtime 03/31/15 0951     03/29/15 2200  levofloxacin (LEVAQUIN) tablet 750 mg     750 mg Oral Daily at bedtime 03/29/15 1023     03/28/15 2000  levofloxacin (LEVAQUIN) tablet 750 mg  Status:  Discontinued     750 mg Oral Every 48 hours 03/27/15 1532 03/29/15 1023   03/26/15 1900  levofloxacin (LEVAQUIN) tablet 750 mg  Status:  Discontinued     750 mg Oral Daily-1800 03/26/15 1747 03/27/15 1532      Assessment: 74 yo male with acute COPD/PNA on levaquin 750mg  po q48hr.  WBC= 9.5, afebrile, SCr= 1.17 and CrCl ~ 60,  Levaquin 5/21 >>  5/21 Blood x 2 - ngtd  Plan:  -Continue  levaquin 750mg  po daily -Consider a 7 day duration? -Will follow renal function, cultures and clinical progress  Harland Germanndrew Sandralee Tarkington, Pharm D 03/31/2015 11:14 AM

## 2015-03-31 NOTE — Progress Notes (Signed)
SATURATION QUALIFICATIONS: (This note is used to comply with regulatory documentation for home oxygen)  Patient Saturations on Room Air at Rest = 88%  Patient Saturations on Room Air while Ambulating = 84%  Patient Saturations on 2 Liters of oxygen while Ambulating = 90%  Please briefly explain why patient needs home oxygen:   03/31/2015 Veda CanningLynn Marquita Lias, PT Pager: [phone]

## 2015-04-01 DIAGNOSIS — R7989 Other specified abnormal findings of blood chemistry: Secondary | ICD-10-CM

## 2015-04-01 LAB — BASIC METABOLIC PANEL
ANION GAP: 7 (ref 5–15)
BUN: 26 mg/dL — ABNORMAL HIGH (ref 6–20)
CHLORIDE: 98 mmol/L — AB (ref 101–111)
CO2: 26 mmol/L (ref 22–32)
CREATININE: 1.24 mg/dL (ref 0.61–1.24)
Calcium: 7.7 mg/dL — ABNORMAL LOW (ref 8.9–10.3)
GFR calc Af Amer: >60 mL/min (ref >60)
GFR, EST NON AFRICAN AMERICAN: 57 mL/min — AB (ref >60)
Glucose, Bld: 149 mg/dL — ABNORMAL HIGH (ref 65–99)
POTASSIUM: 3.7 mmol/L (ref 3.5–5.1)
SODIUM: 131 mmol/L — AB (ref 135–145)

## 2015-04-01 LAB — CBC
HCT: 37.9 % — ABNORMAL LOW (ref 39.0–52.0)
Hemoglobin: 12.3 g/dL — ABNORMAL LOW (ref 13.0–17.0)
MCH: 29 pg (ref 26.0–34.0)
MCHC: 32.5 g/dL (ref 30.0–36.0)
MCV: 89.4 fL (ref 78.0–100.0)
Platelets: 175 10*3/uL (ref 150–400)
RBC: 4.24 MIL/uL (ref 4.22–5.81)
RDW: 16.7 % — ABNORMAL HIGH (ref 11.5–15.5)
WBC: 10.5 10*3/uL (ref 4.0–10.5)

## 2015-04-01 LAB — GLUCOSE, CAPILLARY
GLUCOSE-CAPILLARY: 186 mg/dL — AB (ref 65–99)
Glucose-Capillary: 113 mg/dL — ABNORMAL HIGH (ref 65–99)
Glucose-Capillary: 114 mg/dL — ABNORMAL HIGH (ref 65–99)

## 2015-04-01 MED ORDER — IPRATROPIUM-ALBUTEROL 0.5-2.5 (3) MG/3ML IN SOLN: 3.0000 mL | Freq: Two times a day (BID) | RESPIRATORY_TRACT | Status: DC

## 2015-04-01 NOTE — Progress Notes (Signed)
Pt discharged home.  Pt reports no further weakness or shortness of breath stating "I feel much better"  No c/o pain.  IV D/Cd Tele D/Cd.  Pt given education on diet, activity, meds, and follow-up care and instructions.  Pt verbalized understanding.  Home medications were returned to pt.  Pt still waiting on oxygen delivery.  Once this arrives, pt will be discharged home via private vehicle with family.

## 2015-04-01 NOTE — Progress Notes (Signed)
Physical Therapy Addendum  After speaking to PT colleague, returned to room at 10:01 to further assess pt's vertigo. Found pt slumped to his right resting his head on the windowsill. Alert and reported he suddenly felt overwhelmingly weak and began spinning and could not hold his head up or eyes open (worsens the vertigo). Pt began gasping for air and felt he could not catch his breath. (On 2L O2 and SaO2 99%).  Called RN and reported symptoms. Pt would breath this way for 15 seconds and then breath normally for several minutes. BP 110/74 sitting.  Awaiting RN, assessed for cerebellar or focal signs and all tests normal. After ~5 minutes he reported spinning had stopped. Able to hold his head upright/midline, however would have sudden losses of balance to his left or right and quickly catch himself (almost appears to be falling asleep, slumping to side and catches himself).   RN in to assess pt. Assisted him back to bed with min assist for stand-pivot with pt unsteady. States he feels "woozy and wobbly."  GrenadaBrittany, RN paged MD and RN with patient on PT's departure.   04/01/2015 Russell Hamilton, PT Pager: [phone]

## 2015-04-01 NOTE — Progress Notes (Signed)
Physical Therapy Treatment Patient Details Name: Russell Hamilton MRN: [id number] DOB: 03-17-1941 Today's Date: 04/01/2015    History of Present Illness 74 y.o. male admitted with Acute respiratory failure with hypoxia due to acute COPD. + vertigo with finding of Rt posterior canal BPPV Hx of CAD, chronic kidney disease, DM II, and HTN.    PT Comments    Pt reporting continued short bouts (10 seconds with PT) of vertigo with supine to sit. All BPPV testing bil ears and all canals NEGATIVE. Assessed for vestibular hypofunction and also negative. Ambulating pt more unsteady today. Significant loss of balance as floor cleaner drove past him (coming towards him)--indicative of motion sensitivity. Pt denies h/o motion sensitivity/car sickness. Will consult PT colleague re: other potential vestibular deficits.   Follow Up Recommendations  Outpatient PT;Supervision - Intermittent (vestib rehab--noted out of state insurance won't cover)     Equipment Recommendations  None recommended by PT    Recommendations for Other Services       Precautions / Restrictions Precautions Precautions: Fall    Mobility  Bed Mobility Overal bed mobility: Needs Assistance Bed Mobility: Supine to Sit;Sit to Supine Rolling: Modified independent (Device/Increase time) Sidelying to sit: Supervision Supine to sit: Supervision Sit to supine: Modified independent (Device/Increase time)   General bed mobility comments: supervision coming to EOB due to + vertigo; repeated x 4 with spinning x 10 seconds each time  Transfers Overall transfer level: Needs assistance Equipment used: Straight cane Transfers: Sit to/from Stand Sit to Stand: Min guard         General transfer comment: Min guard for safety with cane and + stagger step with pt recovering without physical assist  Ambulation/Gait Ambulation/Gait assistance: Min assist Ambulation Distance (Feet): 350 Feet Assistive device: Straight cane Gait  Pattern/deviations: Step-through pattern;Decreased stride length;Wide base of support (decr arm swing with "guarded" appearance) Gait velocity: decreased   General Gait Details: Reassessed all canals bil ears with no vertigo elicited prior to ambulation. Pt became unsteady with stagger step when floor cleaning machine drove by and required assistance to recover. (? motion sensitivity)   Stairs Stairs:  (pt reported too fatigued and refused)          Wheelchair Mobility    Modified Rankin (Stroke Patients Only)       Balance           Standing balance support: No upper extremity supported Standing balance-Leahy Scale: Fair Standing balance comment: static balance fair, however could not perform dynamic tasks without imbalance                    Cognition Arousal/Alertness: Awake/alert Behavior During Therapy: WFL for tasks assessed/performed Overall Cognitive Status: No family/caregiver present to determine baseline cognitive functioning Area of Impairment: Safety/judgement         Safety/Judgement: Decreased awareness of safety;Decreased awareness of deficits     General Comments: RN reports she found pt standing in a chair trying to plug his phone charger into the side of TV. Pt states he needed the USB port to charge his cellphone and didn't want to bother anyone.    Exercises      General Comments General comments (skin integrity, edema, etc.): All BPPV testing negative; VOR cancellation intact without dizziness, bil head thrust negative. Currently unclear etiology of vertigo. ? motion sensitivity      Pertinent Vitals/Pain Pain Assessment: No/denies pain    Home Living  Prior Function            PT Goals (current goals can now be found in the care plan section) Acute Rehab PT Goals Patient Stated Goal: Go home PT Goal Formulation: With patient Time For Goal Achievement: 04/13/15 Potential to Achieve Goals:  Good Progress towards PT goals: Not progressing toward goals - comment (continued short episodes of vertigo; balance wors)    Frequency       PT Plan Current plan remains appropriate    Co-evaluation             End of Session Equipment Utilized During Treatment: Gait belt;Oxygen Activity Tolerance: Patient limited by fatigue;Treatment limited secondary to medical complications (Comment) (limited by sense of imbalance) Patient left: in chair;with call bell/phone within reach;with chair alarm set     Time: [id number] PT Time Calculation (min) (ACUTE ONLY): 28 min  Charges:  $Gait Training: 8-22 mins $Therapeutic Activity: 8-22 mins                    G Codes:      Russell Hamilton 04/01/2015, 10:37 AM Pager [phone]

## 2015-04-01 NOTE — Care Management (Signed)
ED CM received call from RN on 2W stating that he received a call from [facility]  Regarding insurance denial for home oxygen from [employer]. Patient was upset and refused to stay, until sortted out. Patient encouraged to keep his f/u appointment on Tuesday 5/31. Patient verbalized understanding,CM signing off.

## 2015-04-01 NOTE — Progress Notes (Signed)
[facility] PROGRESS NOTE   Assessment/Plan: Acute respiratory failure with hypoxia due to acute  COPD (chronic obstructive pulmonary disease) - stable will go home on 2 weeks of oxygen.  Chronic systolic heart failure: Last 2-D echo on 02/27/2015 showed an EF of 30% with diffuse hypokinesia. No events on telemetry, continue daily weights and strict I's and O's. No wheezes spironolactone due to chronic renal disease. Continue beta blocker. - no events on telemetry.  Type 2 diabetes mellitus with diabetic chronic kidney disease - A1c 9.3 on 02/26/2015 -home on 70/30.   Essential HTN: - Well control while on medications,Not COMPLAINCE. - Continue hydralazine and BB  Chronic kidney disease stage 3: - baseline creatinine 1.3 due to diabetes and high blood pressure   Iron deficiency anemia; - Hemoglobin at baseline of 12-13 - Will need outpatient follow up with gastroenterology for colonoscopy - No active bleeding currently    Code Status: full Family Communication: none  Disposition Plan: home in 1-2 days   Consultants:  none  Procedures:  CXR  Antibiotics:  levaquin  HPI/Subjective: No complains wants to go home.  Objective: Filed Vitals:   03/31/15 2143 03/31/15 2150 04/01/15 0438 04/01/15 0500  BP: 122/82  126/60   Pulse:  86 73   Temp:   98.1 F (36.7 C)   TempSrc:   Oral   Resp:  16 18   Height:      Weight:    84.9 kg (187 lb 2.7 oz)  SpO2:  98% 99%     Intake/Output Summary (Last 24 hours) at 04/01/15 0824 Last data filed at 04/01/15 0558  Gross per 24 hour  Intake    480 ml  Output   1075 ml  Net   -595 ml   Filed Weights   03/30/15 0425 03/31/15 0454 04/01/15 0500  Weight: 80.9 kg (178 lb 5.6 oz) 82.4 kg (181 lb 10.5 oz) 84.9 kg (187 lb 2.7 oz)    Exam:  General: Alert, awake, oriented x3, in no acute distress.  HEENT: No bruits, no goiter.  Heart: Regular rate and rhythm, without murmurs, rubs, gallops.  Lungs: Good  air movement, bilateral air movement.  Abdomen: Soft, nontender, nondistended, positive bowel sounds.  Neuro: Grossly intact, nonfocal.   Data Reviewed: Basic Metabolic Panel:  Recent Labs Lab 03/28/15 0435 03/29/15 0425 03/30/15 0354 03/31/15 0500 04/01/15 0537  NA 130* 132* 135 130* 131*  K 4.6 4.6 5.3* 4.4 3.7  CL 97* 101 101 101 98*  CO2 GLUCOSE 306* 254* 219* 294* 149*  BUN 24* 26* 28* 28* 26*  CREATININE 1.38* 1.23 1.27* 1.17 1.24  CALCIUM 7.9* 8.3* 8.6* 8.3* 7.7*   Liver Function Tests: No results for input(s): AST, ALT, ALKPHOS, BILITOT, PROT, ALBUMIN in the last 168 hours. No results for input(s): LIPASE, AMYLASE in the last 168 hours. No results for input(s): AMMONIA in the last 168 hours. CBC:  Recent Labs Lab 03/28/15 0435 03/29/15 0425 03/30/15 0354 03/31/15 0500 04/01/15 0537  WBC 9.6 10.3 11.1* 9.5 10.5  HGB 11.3* 11.7* 11.3* 12.1* 12.3*  HCT 34.3* 35.8* 35.2* 37.8* 37.9*  MCV 88.9 91.1 90.7 89.4 89.4  PLT 164 179 158 158 175   Cardiac Enzymes:  Recent Labs Lab 03/26/15 1910 03/26/15 2325 03/27/15 0544  TROPONINI 0.06* 0.05* 0.07*   BNP (last 3 results)  Recent Labs  02/08/15 1502 02/24/15 1911 03/26/15 1516  BNP 1355.4* 1739.5* 1712.3*    ProBNP (last  3 results)  Recent Labs  08/12/14 1756  PROBNP 7427.0*    CBG:  Recent Labs Lab 03/31/15 0609 03/31/15 1134 03/31/15 1629 03/31/15 2123 04/01/15 0615  GLUCAP 267* 278* 211* 264* 113*    Recent Results (from the past 240 hour(s))  Blood culture (routine x 2)     Status: None (Preliminary result)   Collection Time: 03/26/15  4:00 PM  Result Value Ref Range Status   Specimen Description BLOOD RIGHT ARM  Final   Special Requests BOTTLES DRAWN AEROBIC AND ANAEROBIC 10ML  Final   Culture   Final           BLOOD CULTURE RECEIVED NO GROWTH TO DATE CULTURE WILL BE HELD FOR 5 DAYS BEFORE ISSUING A FINAL NEGATIVE REPORT Performed at [facility]     Report Status PENDING  Incomplete  Blood culture (routine x 2)     Status: None (Preliminary result)   Collection Time: 03/26/15  4:15 PM  Result Value Ref Range Status   Specimen Description BLOOD LEFT ARM  Final   Special Requests BOTTLES DRAWN AEROBIC AND ANAEROBIC 10CC  Final   Culture   Final           BLOOD CULTURE RECEIVED NO GROWTH TO DATE CULTURE WILL BE HELD FOR 5 DAYS BEFORE ISSUING A FINAL NEGATIVE REPORT Performed at [facility]    Report Status PENDING  Incomplete     Studies: No results found.  Scheduled Meds: . arformoterol  15 mcg Nebulization BID  . aspirin  81 mg Oral Daily  . atorvastatin  80 mg Oral q1800  . benzonatate  200 mg Oral BID  . budesonide (PULMICORT) nebulizer solution  0.25 mg Nebulization BID  . carvedilol  6.25 mg Oral BID WC  . chlorpheniramine-HYDROcodone  5 mL Oral Q12H  . collagenase   Topical Daily  . feeding supplement (GLUCERNA SHAKE)  237 mL Oral BID BM  . ferrous sulfate  325 mg Oral BID WC  . fluticasone  2 spray Each Nare Daily  . gabapentin  300 mg Oral TID  . hydrALAZINE  10 mg Oral TID  . insulin aspart  0-5 Units Subcutaneous QHS  . insulin aspart  0-9 Units Subcutaneous TID WC  . insulin glargine  25 Units Subcutaneous QHS  . ipratropium-albuterol  3 mL Nebulization TID  . isosorbide mononitrate  60 mg Oral Daily  . levofloxacin  750 mg Oral QHS  . loratadine  10 mg Oral Daily  . nicotine  21 mg Transdermal Daily  . pantoprazole  40 mg Oral Daily  . predniSONE  50 mg Oral Q breakfast  . sodium chloride  3 mL Intravenous Q12H   Continuous Infusions: . sodium chloride Stopped (03/29/15 1932)    Time Spent: 25 min   FELIZ Rosine BeatTIZ, ABRAHAM  [facility] Pager [phone]. If 7PM-7AM, please contact night-coverage at [email or web address], password [email or web address] 04/01/2015, 8:24 AM  LOS: 6 days

## 2015-04-01 NOTE — Progress Notes (Signed)
The pt. Had been waiting all day long for home 02 and at 1815 the insurance co. called and told him they were not going to pay for home 02 he got very upset  And  Demanded to leave I tried to get him to stay telling him he needed the home 02 and the doctor really wanted him to stay till he got  home 02. He refused to say and left AMA.

## 2015-04-01 NOTE — Care Management Note (Addendum)
Case Management Note  Patient Details  Name: Regis Hinton MRN: [id number] Date of Birth: 11/16/1940  Subjective/Objective: Pt admitted with COPD                   Action/Plan: PTA pt lived at home alone, has had multiple admissions, due to his insurance plan is unable to get [facility] services in [place] as his insurance is a [place] and is out of network with service providers here- pt has been set up with [facility] in past - last seen there in Feb. 2016.   Expected Discharge Date:                  Expected Discharge Plan:  Home/Self Care  In-House Referral:     Discharge planning Services  CM Consult, Follow-up appt scheduled  Post Acute Care Choice:    Choice offered to:     DME Arranged:  Oxygen DME Agency:  [facility]  HH Arranged:    HH Agency:     Status of Service:  Completed, signed off  Medicare Important Message Given:  Yes Date Medicare IM Given:  03/30/15 Medicare IM give by:  Donn Pierini Date Additional Medicare IM Given:    Additional Medicare Important Message give by:     If discussed at Long Length of Stay Meetings, dates discussed:  03/31/15  Additional Comments:  04/01/15- - pt did not get 02 delivered last night f/u done with Fayrene Fearing at [place] at 1000 am this am- who states that driver was under the impression that pt was not leaving until this AM, per Fayrene Fearing 02 should be delivered this am, later this am received another phone call from Fayrene Fearing that [facility] needed an MD signature on the qualifying sats note that was faxed in last evening- MD signature obtained and note re-faxed to [place]-  As of noon pt still did not have 02 and call made again to Fayrene Fearing- who then said that they were still trying to reach pt to confirm payment upon delivery. Call was then made to main office of [facility]- spoke with both susan and Misty Stanley trying to solve the delivery issues- no one seemed to be able to confirm delivery time- stating they were still processing- Was given the provider # to  [employer] in [place] [phone]) to see if we could assist in authorization process- this number provided was to the Medicaid side of [employer] they then gave the # to the Medicare side which was [phone]- this division then transferred call to another division called [employer] who was actually responsible for approving authorizations-  Their # is [phone] choice 1 then option 2 they instructed to have [facility] call them directly and ask to speak with an RN as they had not received any faxes asking for home 02 approval on pt- per conversation the representative felt like they could still get authorization done today if call was made soon. [facility] was notified of this and was to f/u on getting auth. For pt's home 02 and hopefully still be able to deliver tanks to room in next couple of hours.   03/31/15- 1700-  Per [facility] pt  Was out of network and they were unable to bill for home 02-As pt has [place] plan called [facility] to see if they where in-network with pt's [place] plan- per Fayrene Fearing with [facility]- they are in-network for Medicare plans in [place] and should be able to provide home 02- faxed needed paperwork to [facility]- per Yasiel it will take several  hours to process before they can deliver a portable tank to pt's room and they will need to call pt regarding any co-pay cost- notified pt's bedside RN Brittney of plan for 02. [facility] given pt's unit room # for contact.   03/31/15- 1430- pt for d/c home today- needs home 02- however- due to pt's insurance being a out of state provider- pt's insurance will not cover home 02 here- also [facility] or out[facility] therapy due to insurance limitations- spoke with pt at bedside- pt does not want to pay out of pocket for home 02- he verbalized appointment next Tues with the transitional care clinic- and states that he will f/u so that they can assist him with helping him change his insurance among other things so that he can get the f/u that he needs.  03/29/15- Spoke  with [facility] regarding pt- pt has had over 3 missed or canceled appointments- and will need approval from medical director there to have f/u in clinic- Received a return call from [place] who did approve pt to be seen at the Transitional Care clinic appointment set for May 31 at 2:30.The clinic CM will come see pt prior to discharge to encourage f/u with appointment. Have also [facility] with [name] [facility] however pt isnot eligible for services due to his out of network provider. NCM following for possible 02 needs- but unsure if he would be able to get home 02 due to his insurance issues- have spoken to pt regarding changing his insurance to a [place] provider- he states that his niece is working with him on that.    Donn PieriniWebster, Soren Pigman [place] Lake[place], [place]RN- [phone] 04/01/2015, 9:25 PM

## 2015-04-01 NOTE — Progress Notes (Signed)
Pharmacist Heart Failure Core Measure Documentation  Assessment: Nat MathJames Perra has an EF documented as 30% on 4/16  by ECHO  Rationale: Heart failure patients with left ventricular systolic dysfunction (LVSD) and an EF < 40% should be prescribed an angiotensin converting enzyme inhibitor (ACEI) or angiotensin receptor blocker (ARB) at discharge unless a contraindication is documented in the medical record.  This patient is not currently on an ACEI or ARB for HF.  This note is being placed in the record in order to provide documentation that a contraindication to the use of these agents is present for this encounter.  ACE Inhibitor or Angiotensin Receptor Blocker is contraindicated (specify all that apply)  []   ACEI allergy AND ARB allergy []   Angioedema []   Moderate or severe aortic stenosis []   Hyperkalemia []   Hypotension []   Renal artery stenosis [x]   Worsening renal function, preexisting renal disease or dysfunction   Leota SauersLisa Sidra Oldfield Pharm.D. CPP, BCPS Clinical Pharmacist [phone] 04/01/2015 9:26 AM

## 2015-04-01 NOTE — Progress Notes (Signed)
Have made multiple attempts throughout day to have pt's 02 delivered- have spoken to [place] with [facility] along with several other reps. Including supervisor Darl PikesSusan. Was finally told at 4pm that they were still awaiting insurance auth. And could not deliver tanks until they receive auth. From insurance- call then was made to pt's insurance provider and after being transferred to 3 different departments- spoke with a rep., at [employer] who gives the auths for pt's provider- they told this CM that they do not have any faxes from [employer] however have received phone calls. Was instructed to have [facility] call them directly at [phone] option 1 then option 2 and have them speak with an RN to provide direct clinical info and expedite home 02 auth. - this info was then called into [facility]- was told [facility] would call and take care of auth. Per [employer] was told that pt could still be approved and discharged tonight.  Will await word from [facility] and hopefully have 02 here in next couple of hours.

## 2015-04-01 NOTE — Hospital Discharge Follow-Up (Signed)
Met with patient today and confirmed his appointment for follow up at the [facility]. He reported that he will not have any problem getting to the clinic. He was still waiting for the oxygen delivery.

## 2015-04-01 NOTE — Progress Notes (Signed)
PT had contacted me to let me know pt was feeling weak and dizzy with ambulation.  Pt had stable VS. MD was notified.  Pt reported feeling better thirty minutes later with no further c/o dizziness.

## 2015-04-02 LAB — CULTURE, BLOOD (ROUTINE X 2)
Culture: NO GROWTH
Culture: NO GROWTH

## 2015-04-04 ENCOUNTER — Telehealth: Payer: Self-pay

## 2015-04-04 NOTE — Telephone Encounter (Signed)
This CM attempted to contact the patient to confirm [facility]30[facility]. A voice mail message was left on his cell phone requesting a call back. This CM then called the patient's niece, Archie Pattenonya, who he approved as a contact, and voice mail messages were left on her cell and home phones requesting a call back.  This CM then received a call back from [place]. CM informed her of member's appointment tomorrow and she said that she was not aware of the appointment.  CM informed her that the appointment is scheduled for 2:30pm and inquired about the need for transportation. Archie Pattenonya said that she could provide transportation and she would call the patient when she hangs up to notify him of the appointment.  CM instructed her to arrive with patient at least 15 minutes prior to his scheduled appointment. She said that he continues to live independently and noted that she has called him or he has called her every day since he came home.  She noted that he went home without the "machine" because he was tired of waiting to go home.  CM instructed her to have him bring all of his medications to his appointment tomorrow and she verbalized understanding. She reported no problems or concerns about the patient and his medical needs at this time.

## 2015-04-05 ENCOUNTER — Ambulatory Visit: Payer: Medicare (Managed Care) | Attending: Family Medicine | Admitting: Family Medicine

## 2015-04-05 ENCOUNTER — Encounter: Payer: Self-pay | Admitting: Family Medicine

## 2015-04-05 ENCOUNTER — Telehealth: Payer: Self-pay

## 2015-04-05 VITALS — BP 130/82 | HR 94 | Temp 98.0°F | Resp 18 | Ht 69.0 in | Wt 178.0 lb

## 2015-04-05 DIAGNOSIS — N183 Chronic kidney disease, stage 3 unspecified: Secondary | ICD-10-CM

## 2015-04-05 DIAGNOSIS — J441 Chronic obstructive pulmonary disease with (acute) exacerbation: Secondary | ICD-10-CM

## 2015-04-05 DIAGNOSIS — I509 Heart failure, unspecified: Secondary | ICD-10-CM | POA: Diagnosis not present

## 2015-04-05 DIAGNOSIS — E1165 Type 2 diabetes mellitus with hyperglycemia: Secondary | ICD-10-CM | POA: Diagnosis present

## 2015-04-05 DIAGNOSIS — N189 Chronic kidney disease, unspecified: Secondary | ICD-10-CM | POA: Diagnosis not present

## 2015-04-05 DIAGNOSIS — I10 Essential (primary) hypertension: Secondary | ICD-10-CM | POA: Diagnosis not present

## 2015-04-05 DIAGNOSIS — I5043 Acute on chronic combined systolic (congestive) and diastolic (congestive) heart failure: Secondary | ICD-10-CM | POA: Diagnosis not present

## 2015-04-05 DIAGNOSIS — E1122 Type 2 diabetes mellitus with diabetic chronic kidney disease: Secondary | ICD-10-CM | POA: Diagnosis not present

## 2015-04-05 DIAGNOSIS — E11621 Type 2 diabetes mellitus with foot ulcer: Secondary | ICD-10-CM | POA: Insufficient documentation

## 2015-04-05 DIAGNOSIS — K219 Gastro-esophageal reflux disease without esophagitis: Secondary | ICD-10-CM

## 2015-04-05 DIAGNOSIS — J449 Chronic obstructive pulmonary disease, unspecified: Secondary | ICD-10-CM | POA: Insufficient documentation

## 2015-04-05 DIAGNOSIS — Z9114 Patient's other noncompliance with medication regimen: Secondary | ICD-10-CM | POA: Diagnosis not present

## 2015-04-05 DIAGNOSIS — G3183 Dementia with Lewy bodies: Secondary | ICD-10-CM

## 2015-04-05 DIAGNOSIS — L97521 Non-pressure chronic ulcer of other part of left foot limited to breakdown of skin: Secondary | ICD-10-CM

## 2015-04-05 DIAGNOSIS — F0280 Dementia in other diseases classified elsewhere without behavioral disturbance: Secondary | ICD-10-CM

## 2015-04-05 LAB — GLUCOSE, POCT (MANUAL RESULT ENTRY): POC Glucose: 399 mg/dl — AB (ref 70–99)

## 2015-04-05 MED ORDER — GLIMEPIRIDE 4 MG PO TABS: 4.0000 mg | ORAL_TABLET | Freq: Every day | ORAL | Status: DC

## 2015-04-05 MED ORDER — FUROSEMIDE 40 MG PO TABS: 80.0000 mg | ORAL_TABLET | Freq: Every day | ORAL | Status: DC

## 2015-04-05 NOTE — Telephone Encounter (Signed)
This CM attempted to call the patient to confirm his transportation for his appointment today. Voice mail message left with CM call back number. CM then attempted to contact his niece, Archie Pattenonya, to confirm transportation and left voice mail messages on both her home and cell numbers.   CM  received a call from the patient and he stated that he was not sure about the transportation. He was [facility] him.

## 2015-04-05 NOTE — Progress Notes (Addendum)
Date of Telephone call: 04/04/15 Date of 1st service:04/05/15  Admit Date:03/26/15 Discharge Date: 04/01/15  PCP: None  HPI Russell Hamilton 74 year old male with a history of uncontrolled Type 2 DM, CHF, CAD (who has declined cardiac cath), non compliance with medical management recently hospitalized for respiratory failure secondary to COPD exacerbation.  He  Had presented with cough at home but and  Worsening lower extremity swelling and pain in his feet, increased dyspnea on exertion, cough was productive of thick clear to mildly yellow sputum. His cough was frequent and severe and he has had frequent posttussive emesis. Nonbloody, nonbilious emesis. He denies fevers, chills, PND, orthopnea.   In the emergency room he was afebrile, heart rate in the low 100s, respirations 16 to the mid 30s with very frequent cough, blood pressure mildly elevated, oxygen saturations were normal however he was placed on 2L oxygen. Labs were normal except for a glucose of 197. His chest x-ray demonstrated patchy bilateral lobe opacities concerning for atelectasis versus pneumonia and possible small bilateral pleural effusions. BNP was 1712. His troponin was 0.1, however he continued to decline cardiac catheterization therefore cardiology was not notified.  He was given DuoNeb, soluble Medrol, Lasix, Levaquin and a full dose aspirin and was managed for presumptive acute COPD exacerbation with possible acute on chronic systolic heart failure. His condition improved and he was weaned off oxygen prior to discharge.     Interval History: Complains of pain in his L foot  And has been inconsistent with the duration; initially tells me x3 days and later tells me it was since he was hospitalized and swelling which occurred overnight and he was refusing to take off his sock due to pain and had to be persuaded to do so.   He lives alone and is in charge of his medications. He is not here with his Archie Pattenonya  (who he approved as a  contact) Admits to not finding some medications today and so could not bring in is bottles neither did he take his medications. Admits to forgetting things and sometimes forgets to turn off the stove but then does so in 10 mins.  Past Medical History  Diagnosis Date  . Hypertension   . Chronic systolic CHF (congestive heart failure)     a. 2D ECHO 08/13/14: EF 40%, diffuse hypokinesis possibly worse in the inferior wall. Mild MR, mild LA dilation, PA pressure 50. Trivial pericardial effusion.  . Acute systolic CHF (congestive heart failure), NYHA class 4 08/12/2014  . Type II diabetes mellitus   . GERD (gastroesophageal reflux disease)   . Peripheral neuropathy     [name]/notes 08/12/2014  . Stroke 2010    "memory problems since; right leg and left arm weakness since" (02/08/2015)  . Ulcers of both lower extremities     [name]/notes 02/24/2015  . Noncompliance     [name]/notes 02/24/2015  . COPD (chronic obstructive pulmonary disease)     Past Surgical History  Procedure Laterality Date  . Carotid endarterectomy Right 2010  . Hand ligament reconstruction Right ?1970's    "pinky"    History   Social History  . Marital Status: Married    Spouse Name: N/A  . Number of Children: N/A  . Years of Education: N/A   Occupational History  . Not on file.   Social History Main Topics  . Smoking status: Current Every Day Smoker -- 0.50 packs/day for 56 years    Types: Cigarettes  . Smokeless tobacco: Never Used  .  Alcohol Use: Yes     Comment: 02/08/2015 "I might have a beer q 6 months"  . Drug Use: No  . Sexual Activity: Not Currently   Other Topics Concern  . Not on file   Social History Narrative    No Known Allergies  Current Outpatient Prescriptions on File Prior to Visit  Medication Sig Dispense Refill  . albuterol (PROVENTIL HFA;VENTOLIN HFA) 108 (90 BASE) MCG/ACT inhaler Inhale 2 puffs into the lungs every 6 (six) hours as needed for wheezing or shortness of breath. 1 Inhaler 0  . aspirin  81 MG chewable tablet Chew 1 tablet (81 mg total) by mouth daily. 30 tablet 3  . gabapentin (NEURONTIN) 300 MG capsule Take 1 capsule (300 mg total) by mouth 3 (three) times daily. 90 capsule 0  . atorvastatin (LIPITOR) 80 MG tablet Take 1 tablet (80 mg total) by mouth daily at 6 PM. 30 tablet 0  . benzonatate (TESSALON) 200 MG capsule Take 1 capsule (200 mg total) by mouth 2 (two) times daily. 10 capsule 0  . collagenase (SANTYL) ointment Apply topically daily. (Patient not taking: Reported on 03/28/2015) 15 g 0  . ferrous sulfate 325 (65 FE) MG tablet Take 1 tablet (325 mg total) by mouth 2 (two) times daily with a meal. 30 tablet 0  . fluticasone (FLONASE) 50 MCG/ACT nasal spray Place 2 sprays into both nostrils daily. 9.9 g 0  . hydrALAZINE (APRESOLINE) 10 MG tablet Take 1 tablet (10 mg total) by mouth 3 (three) times daily. 90 tablet 0  . isosorbide mononitrate (IMDUR) 60 MG 24 hr tablet Take 1 tablet (60 mg total) by mouth daily. 30 tablet 0  . levofloxacin (LEVAQUIN) 750 MG tablet Take 1 tablet (750 mg total) by mouth at bedtime. 3 tablet 0  . loratadine (CLARITIN) 10 MG tablet Take 1 tablet (10 mg total) by mouth daily. 30 tablet 0  . mometasone-formoterol (DULERA) 100-5 MCG/ACT AERO Inhale 2 puffs into the lungs 2 (two) times daily. 1 Inhaler 0  . nicotine (NICODERM CQ - DOSED IN MG/24 HOURS) 21 mg/24hr patch Place 1 patch (21 mg total) onto the skin daily. 28 patch 0  . pantoprazole (PROTONIX) 40 MG tablet Take 1 tablet (40 mg total) by mouth daily. 30 tablet 0  . potassium chloride SA (K-DUR,KLOR-CON) 20 MEQ tablet Take 1 tablet (20 mEq total) by mouth daily. 30 tablet 0  . predniSONE (DELTASONE) 10 MG tablet Take 4 tablets (40 mg) daily for 2 days, then, Take 3 tablets (30 mg) daily for 2 days, then, Take 2 tablets (20 mg) daily for 2 days, then, Take 1 tablets (10 mg) daily for 1 days, then stop 19 tablet 0  . tiotropium (SPIRIVA HANDIHALER) 18 MCG inhalation capsule Place 1 capsule  (18 mcg total) into inhaler and inhale daily. 30 capsule 0  . traMADol (ULTRAM) 50 MG tablet Take 1 tablet (50 mg total) by mouth every 6 (six) hours as needed for moderate pain (cough). 15 tablet 0   No current facility-administered medications on file prior to visit.     REVIEW OF SYSTEMS General: negative for fever, weight loss, appetite change Eyes: no visual symptoms. ENT: no ear symptoms, no sinus tenderness, no nasal congestion or sore throat. Neck: no pain  Respiratory: no wheezing, shortness of breath, cough Cardiovascular: no chest pain, no dyspnea on exertion, no pedal edema, no orthopnea. Gastrointestinal: no abdominal pain, no diarrhea, no constipation Genito-Urinary: increased urinary frequency, no dysuria, no polyuria. Hematologic: no  bruising Endocrine: no cold or heat intolerance Neurological: no headaches, no seizures, no tremors, paresthesia in feet. Musculoskeletal: pain in left leg Skin: no pruritus, no rash. Psychological: no depression, no anxiety,       PHYSICAL EXAM  Filed Vitals:   04/05/15 1438  BP: 130/82  Pulse: 94  Temp: 98 F (36.7 C)  TempSrc: Oral  Resp: 18  Height: 5\' 9"  (1.753 m)  Weight: 178 lb (80.74 kg)  SpO2: 96%    Constitutional: normal appearing,  Eyes: PERRLA HEENT: Head is atraumatic, normal sinuses, normal oropharynx, normal appearing tonsils and palate, tympanic membrane is normal bilaterally. Neck: normal range of motion, no thyromegaly, no JVD Cardiovascular: normal rate and rhythm, normal heart sounds, no murmurs, rub or gallop, 3+ pitting pedal edema Respiratory: clear to auscultation bilaterally, no wheezes, no rales, no rhonchi Abdomen: soft, not tender to palpation, normal bowel sounds, no enlarged organs Extremities: tenderness in legs Skin: left foot with ulcer at lateral aspect of foot with central scab with surrounding hyperpigmentation, callus on heel.  Neurological: alert, oriented x3, cranial nerves I-XII  grossly intact , normal motor strength, normal sensation. Psychological: normal mood.    Transthoracic Echocardiography  Patient:  Russell Hamilton, Russell Hamilton MR #:    [id number] Study Date: 02/27/2015 Gender:   M Age:    74 Height:   175.3 cm Weight:   68.2 kg BSA:    1.82 m^2 Pt. Status: Room:    [address]  Carron Curie [id number] BJYNWGNFA  OZHYQ, MVHQIONGE [address] [id number] PERFORMING  [facility], Inpatient SONOGRAPHER Dominica Severin, RCS  cc:  ------------------------------------------------------------------- LV EF: 30% -  35%  ------------------------------------------------------------------- Indications:   428.0 CHF. Chest pain.Marland Kitchen  ------------------------------------------------------------------- Study Conclusions  - Left ventricle: The cavity size was mildly dilated. Wall thickness was increased in a pattern of mild LVH. Systolic function was moderately to severely reduced. The estimated ejection fraction was in the range of 30% to 35%. Diffuse hypokinesis. Doppler parameters are consistent with abnormal left ventricular relaxation (grade 1 diastolic dysfunction). Doppler parameters are consistent with high ventricular filling pressure. - Aortic valve: Valve area (VTI): 1.33 cm^2. Valve area (Vmax): 1.3 cm^2. Valve area (Vmean): 1.23 cm^2. - Mitral valve: Calcified annulus. Valve area by continuity equation (using LVOT flow): 1.68 cm^2. - Left atrium: The atrium was moderately dilated. - Right atrium: The atrium was moderately dilated. - Tricuspid valve: There was moderate regurgitation. - Pulmonary arteries: PA peak pressure: 43 mm Hg (S). - Pericardium, extracardiac: A trivial pericardial effusion was identified.  Transthoracic echocardiography. M-mode, complete 2D, spectral Doppler, and color Doppler. Birthdate: Patient birthdate: Dec 08, 1940. Age:  Patient is 74 yr old. Sex: Gender: male. BMI: 22.2 kg/m^2. Blood pressure:   142/86 Patient status: Inpatient. Study date: Study date: 02/27/2015. Study time: 08:20 AM. Location: Bedside.  -------------------------------------------------------------------  ------------------------------------------------------------------- Left ventricle: The cavity size was mildly dilated. Wall thickness was increased in a pattern of mild LVH. Systolic function was moderately to severely reduced. The estimated ejection fraction was in the range of 30% to 35%. Diffuse hypokinesis. Doppler parameters are consistent with abnormal left ventricular relaxation (grade 1 diastolic dysfunction). Doppler parameters are consistent with high ventricular filling pressure.  ------------------------------------------------------------------- Aortic valve:  Trileaflet; mildly thickened, mildly calcified leaflets. Sclerosis without stenosis. Doppler: There was no significant regurgitation.  VTI ratio of LVOT to aortic valve: 0.42. Valve area (VTI): 1.33 cm^2. Indexed valve area (VTI): 0.73 cm^2/m^2. Peak velocity ratio of LVOT to aortic  valve: 0.41. Valve area (Vmax): 1.3 cm^2. Indexed valve area (Vmax): 0.71 cm^2/m^2. Mean velocity ratio of LVOT to aortic valve: 0.39. Valve area (Vmean): 1.23 cm^2. Indexed valve area (Vmean): 0.67 cm^2/m^2. Mean gradient (S): 8 mm Hg. Peak gradient (S): 13 mm Hg.  ------------------------------------------------------------------- Mitral valve:  Calcified annulus. Doppler:   Valve area by pressure half-time: 5.5 cm^2. Indexed valve area by pressure half-time: 3.02 cm^2/m^2. Valve area by continuity equation (using LVOT flow): 1.68 cm^2. Indexed valve area by continuity equation (using LVOT flow): 0.92 cm^2/m^2.  Mean gradient (D): 4 mm Hg. Peak gradient (D): 4 mm Hg.  ------------------------------------------------------------------- Left atrium: The  atrium was moderately dilated.  ------------------------------------------------------------------- Right ventricle: The cavity size was normal. Wall thickness was normal. Systolic function was normal.  ------------------------------------------------------------------- Pulmonic valve:  Structurally normal valve.  Cusp separation was normal. Doppler: Transvalvular velocity was within the normal range. There was no regurgitation.  ------------------------------------------------------------------- Tricuspid valve:  Mildly thickened leaflets. Doppler: There was moderate regurgitation.  ------------------------------------------------------------------- Right atrium: The atrium was moderately dilated.  ------------------------------------------------------------------- Pericardium: A trivial pericardial effusion was identified.  -------------------------------------------------------------------         ASSESMENT/PLAN 74 year old male with a history of uncontrolled Type 2 DM, CHF, CAD (who has declined cardiac cath), non compliance with medical management recently hospitalized for respiratory failure secondary to COPD exacerbation   Type 2 DM: Uncontrolled with Hba1c of 9.3, CBG 399 Increase Glimepiride from 2 to .  COPD exacerbation with respiratory failure Continue tapered steroids and complete Levaquin (bottles are absent and the patient does not know if he has completed the course)   CHF: EF 30-35% Continue Daily weights, limit fluids to 2L/day, low sodium advised Referred to Cardiology Refuses Cardiac Cath Continue Lasix  CKD: Will send off CMET at next visit as I increased his Lasix dose today and his creatinine is likely to be elevated.  Pedal edema: Secondary to CHF and this appears to be the source of his pain. Lasix increased to  daily for the next one week and I will see him back in 1 week.  Foot ulcer: Ulcer on lateral aspect of left  foot which is not tender and does not seem to be the source of the pain. He will need to see Podiatry on site in 2 days given his history of Diabetic Neuropathy   Health Care Needs:  Compliance with medications is a major issue due to poor insight; dementia could be contributory. He is refusing visiting nurse to review his meds and case manager and social worker called in to see the patient to help with insurance issues and coordination of care. Transportation arranged to and from office visit

## 2015-04-05 NOTE — Progress Notes (Signed)
Patient hospitalized for shortness of breath last week. Patient feels "a lot better, except for the pain in my foot". Patient reports pain in feet, at level 8, described as aching, left foot is worse.  Blood glucose currently 399. Patient does not take insulin.

## 2015-04-05 NOTE — Patient Instructions (Signed)
Diabetes Mellitus and Food It is important for you to manage your blood sugar (glucose) level. Your blood glucose level can be greatly affected by what you eat. Eating healthier foods in the appropriate amounts throughout the day at about the same time each day will help you control your blood glucose level. It can also help slow or prevent worsening of your diabetes mellitus. Healthy eating may even help you improve the level of your blood pressure and reach or maintain a healthy weight.  HOW CAN FOOD AFFECT ME? Carbohydrates Carbohydrates affect your blood glucose level more than any other type of food. Your dietitian will help you determine how many carbohydrates to eat at each meal and teach you how to count carbohydrates. Counting carbohydrates is important to keep your blood glucose at a healthy level, especially if you are using insulin or taking certain medicines for diabetes mellitus. Alcohol Alcohol can cause sudden decreases in blood glucose (hypoglycemia), especially if you use insulin or take certain medicines for diabetes mellitus. Hypoglycemia can be a life-threatening condition. Symptoms of hypoglycemia (sleepiness, dizziness, and disorientation) are similar to symptoms of having too much alcohol.  If your health care provider has given you approval to drink alcohol, do so in moderation and use the following guidelines:  Women should not have more than one drink per day, and men should not have more than two drinks per day. One drink is equal to:  12 oz of beer.  5 oz of wine.  1 oz of hard liquor.  Do not drink on an empty stomach.  Keep yourself hydrated. Have water, diet soda, or unsweetened iced tea.  Regular soda, juice, and other mixers might contain a lot of carbohydrates and should be counted. WHAT FOODS ARE NOT RECOMMENDED? As you make food choices, it is important to remember that all foods are not the same. Some foods have fewer nutrients per serving than other  foods, even though they might have the same number of calories or carbohydrates. It is difficult to get your body what it needs when you eat foods with fewer nutrients. Examples of foods that you should avoid that are high in calories and carbohydrates but low in nutrients include:  Trans fats (most processed foods list trans fats on the Nutrition Facts label).  Regular soda.  Juice.  Candy.  Sweets, such as cake, pie, doughnuts, and cookies.  Fried foods. WHAT FOODS CAN I EAT? Have nutrient-rich foods, which will nourish your body and keep you healthy. The food you should eat also will depend on several factors, including:  The calories you need.  The medicines you take.  Your weight.  Your blood glucose level.  Your blood pressure level.  Your cholesterol level. You also should eat a variety of foods, including:  Protein, such as meat, poultry, fish, tofu, nuts, and seeds (lean animal proteins are best).  Fruits.  Vegetables.  Dairy products, such as milk, cheese, and yogurt (low fat is best).  Breads, grains, pasta, cereal, rice, and beans.  Fats such as olive oil, trans fat-free margarine, canola oil, avocado, and olives. DOES EVERYONE WITH DIABETES MELLITUS HAVE THE SAME MEAL PLAN? Because every person with diabetes mellitus is different, there is not one meal plan that works for everyone. It is very important that you meet with a dietitian who will help you create a meal plan that is just right for you. Document Released: 07/19/2005 Document Revised: 10/27/2013 Document Reviewed: 09/18/2013 Exit[employer] Patient Information 2015 [employer]. This   information is not intended to replace advice given to you by your health care provider. Make sure you discuss any questions you have with your health care provider.  

## 2015-04-05 NOTE — Progress Notes (Signed)
Quick Note:  Labs addressed at office as well as medications and patient was made aware. ______ 

## 2015-04-06 ENCOUNTER — Telehealth: Payer: Self-pay

## 2015-04-06 NOTE — Telephone Encounter (Signed)
Called patient to check on his status. Voice mail message left with call back requested.

## 2015-04-06 NOTE — Telephone Encounter (Signed)
11:55am - received call from patient.  He stated that he is not having any problems with his breathing. He noted that his legs are still swollen but he has not changed the dressing yet as he just woke up. He said that he would call CM back after he changes the dressing on his foot. He also reported that he needs transportation to his appointment tomorrow.  4:37pm - voice mail message  Received from patient stating that "everything is fine."  He said that his foot is just swollen and sore.  Transportation to his appointment was arranged by Mare Loanecilia at [facility] .  This CM called patient back to inform him of the transportation arrangements. Voice mail message left notifying him that the cab will pick him up at 4:30pm tomorrow, 04/07/15 for his 5:00pm appointment. CM call back # left.

## 2015-04-07 ENCOUNTER — Telehealth: Payer: Self-pay

## 2015-04-07 NOTE — Telephone Encounter (Signed)
Received phone call from patient that he arrived for Podiatry appointment and his name was not on list.  Spoke with Harvin HazelJamilla and patient's name and contact information given to registration so patient will be seen today by Podiatry. Patient indicates his feet are hurting and swollen.  Reminded patient to continue to take Lasix 80 mg daily x1 week, then lasix 40 mg daily as prescribed on 04/06/15 by Dr. Venetia NightAmao.  Patient indicates he will continue taking medication as prescribed. Patient will be seen today at Podiatry clinic.  Cab will transport home. Practice Manager aware cab will need to be called after patient seen at Podiatry clinic.  Will follow-up with patient at a later time.

## 2015-04-08 ENCOUNTER — Telehealth: Payer: Self-pay

## 2015-04-08 NOTE — Telephone Encounter (Signed)
Call placed to patient to follow-up on his medical condition. Patient indicates he is breathing well today and that his feet are "less swollen and less painful" than yesterday.  He saw Podiatrist yesterday who he indicates [facility].  This CM informed patient to let us know when his appointment will be with the [facility].  Also reminded patient to continue taking Lasix 80 mg daily until 6/7 (appointment scheduled that day), then patient will take 40 mg of Lasix daily after.  Inquired if patient is able to identify which of his medications were Lasix, and he indicates he was.  Again, reiterated dosage increase due to pedal edema. Patient also reminded to bring in all medications to appointment so they can be reconciled and organized. Patient verbalized understanding. Patient has [place] [employer]. Discussed patient meeting with Social Work to switch patient to traditional Medicare or to a local [employer] plan. Patient agreeable to this and indicates meeting with the Social Worker to address insurance issue would be helpful.  Lastly, discussed if transportation will be needed to patient's appointment on 04/12/15 he indicates transportation would be needed.  Cecilia in Registration notified, and she indicates she would arrange needed transportation.  Addendum 1000-SW appointment scheduled on 04/12/15 at 1415. Call placed to patient to inform. Unable to reach; left voicemail requesting return call.

## 2015-04-11 ENCOUNTER — Telehealth: Payer: Self-pay

## 2015-04-11 NOTE — Telephone Encounter (Signed)
Received a missed call notification from patient, no message was left. This CM then called patient again to notify him that transportation has been scheduled for his appointment tomorrow. The cab will pick him up at 1:30pm. Voice mail message left requesting a call back.

## 2015-04-11 NOTE — Telephone Encounter (Signed)
Called patient to confirm his appointment for tomorrow and to check on his status. Voicemail message left requesting a call back.

## 2015-04-11 NOTE — Telephone Encounter (Signed)
Call received from patient. Transportation pick time of 1:30pm confirmed as well as his appointment time with the SW and MD. He said that he will bring in all of his medications for review. He noted that the swelling of his feet is the same  and he has occasional shortness of breath and he will see the doctor tomorrow.

## 2015-04-12 ENCOUNTER — Ambulatory Visit: Payer: Medicare (Managed Care) | Admitting: Clinical

## 2015-04-12 ENCOUNTER — Ambulatory Visit: Payer: Medicare (Managed Care) | Attending: Family Medicine | Admitting: Family Medicine

## 2015-04-12 ENCOUNTER — Encounter: Payer: Self-pay | Admitting: Family Medicine

## 2015-04-12 VITALS — BP 109/75 | HR 90 | Temp 97.9°F | Resp 18 | Ht 69.0 in | Wt 150.0 lb

## 2015-04-12 DIAGNOSIS — I129 Hypertensive chronic kidney disease with stage 1 through stage 4 chronic kidney disease, or unspecified chronic kidney disease: Secondary | ICD-10-CM | POA: Diagnosis not present

## 2015-04-12 DIAGNOSIS — Z9114 Patient's other noncompliance with medication regimen: Secondary | ICD-10-CM | POA: Diagnosis not present

## 2015-04-12 DIAGNOSIS — R42 Dizziness and giddiness: Secondary | ICD-10-CM | POA: Insufficient documentation

## 2015-04-12 DIAGNOSIS — E1121 Type 2 diabetes mellitus with diabetic nephropathy: Secondary | ICD-10-CM | POA: Insufficient documentation

## 2015-04-12 DIAGNOSIS — I5022 Chronic systolic (congestive) heart failure: Secondary | ICD-10-CM | POA: Diagnosis not present

## 2015-04-12 DIAGNOSIS — I69398 Other sequelae of cerebral infarction: Secondary | ICD-10-CM | POA: Insufficient documentation

## 2015-04-12 DIAGNOSIS — R609 Edema, unspecified: Secondary | ICD-10-CM | POA: Diagnosis not present

## 2015-04-12 DIAGNOSIS — Z7982 Long term (current) use of aspirin: Secondary | ICD-10-CM | POA: Insufficient documentation

## 2015-04-12 DIAGNOSIS — N189 Chronic kidney disease, unspecified: Secondary | ICD-10-CM | POA: Diagnosis not present

## 2015-04-12 DIAGNOSIS — IMO0002 Reserved for concepts with insufficient information to code with codable children: Secondary | ICD-10-CM

## 2015-04-12 DIAGNOSIS — L97521 Non-pressure chronic ulcer of other part of left foot limited to breakdown of skin: Secondary | ICD-10-CM

## 2015-04-12 DIAGNOSIS — E11621 Type 2 diabetes mellitus with foot ulcer: Secondary | ICD-10-CM | POA: Insufficient documentation

## 2015-04-12 DIAGNOSIS — E1129 Type 2 diabetes mellitus with other diabetic kidney complication: Secondary | ICD-10-CM | POA: Diagnosis not present

## 2015-04-12 DIAGNOSIS — I69354 Hemiplegia and hemiparesis following cerebral infarction affecting left non-dominant side: Secondary | ICD-10-CM | POA: Diagnosis not present

## 2015-04-12 DIAGNOSIS — E1165 Type 2 diabetes mellitus with hyperglycemia: Secondary | ICD-10-CM | POA: Insufficient documentation

## 2015-04-12 DIAGNOSIS — Z7951 Long term (current) use of inhaled steroids: Secondary | ICD-10-CM | POA: Insufficient documentation

## 2015-04-12 DIAGNOSIS — J441 Chronic obstructive pulmonary disease with (acute) exacerbation: Secondary | ICD-10-CM | POA: Diagnosis not present

## 2015-04-12 DIAGNOSIS — H8110 Benign paroxysmal vertigo, unspecified ear: Secondary | ICD-10-CM | POA: Insufficient documentation

## 2015-04-12 DIAGNOSIS — I2510 Atherosclerotic heart disease of native coronary artery without angina pectoris: Secondary | ICD-10-CM | POA: Insufficient documentation

## 2015-04-12 DIAGNOSIS — I5021 Acute systolic (congestive) heart failure: Secondary | ICD-10-CM

## 2015-04-12 DIAGNOSIS — Z658 Other specified problems related to psychosocial circumstances: Secondary | ICD-10-CM

## 2015-04-12 DIAGNOSIS — I10 Essential (primary) hypertension: Secondary | ICD-10-CM

## 2015-04-12 DIAGNOSIS — E1140 Type 2 diabetes mellitus with diabetic neuropathy, unspecified: Secondary | ICD-10-CM | POA: Diagnosis not present

## 2015-04-12 DIAGNOSIS — L97529 Non-pressure chronic ulcer of other part of left foot with unspecified severity: Secondary | ICD-10-CM | POA: Diagnosis not present

## 2015-04-12 DIAGNOSIS — K219 Gastro-esophageal reflux disease without esophagitis: Secondary | ICD-10-CM | POA: Insufficient documentation

## 2015-04-12 MED ORDER — GLIMEPIRIDE 4 MG PO TABS: 4.0000 mg | ORAL_TABLET | Freq: Every day | ORAL | Status: DC

## 2015-04-12 NOTE — Progress Notes (Signed)
Subjective:    Patient ID: Russell Hamilton, male    DOB: December 17, 1940, 74 y.o.   MRN: [id number]  HPI  Russell Hamilton is a 74 year old male with a history of uncontrolled Type 2 DM, CHF, CAD (who has declined cardiac cath), non compliance with medical management recently hospitalized for respiratory failure secondary to COPD exacerbation.  He was seen at the transitional care clinic last week and at the time wasn't sure of his medications and did not have his bottles with him, his blood sugar was also elevated and so he was advised to come into the clinic today for review of his medications which I have done personally today. He has several bottles of Lasix 40 mg and 1 bottle of 80 mg, he has Coreg and Lisinopril bottles which are absent on his medication list. Glimepiride is missing and he is does not have any oral diabetic medications with him. He has no idea what the medications are for and states he cannot see clearly will need to get his glasses which he forgot at home. Admit to some dizziness which occurred today while getting up from his bed but otherwise he feels fine.   Interval History: States his pedal edema has improved; he still has some thobbing pain in his feet which resolves when he takes when he takes Lasix. He does not check his sugars because he does not have a meter. Still refuses to have a Home Health Nurse .  Past Medical History  Diagnosis Date  . Hypertension   . Chronic systolic CHF (congestive heart failure)     a. 2D ECHO 08/13/14: EF 40%, diffuse hypokinesis possibly worse in the inferior wall. Mild MR, mild LA dilation, PA pressure 50. Trivial pericardial effusion.  . Acute systolic CHF (congestive heart failure), NYHA class 4 08/12/2014  . Type II diabetes mellitus   . GERD (gastroesophageal reflux disease)   . Peripheral neuropathy     [name] 08/12/2014  . Stroke 2010    "memory problems since; right leg and left arm weakness since" (02/08/2015)  . Ulcers of both lower  extremities     [name] 02/24/2015  . Noncompliance     [name] 02/24/2015  . COPD (chronic obstructive pulmonary disease)     Past Surgical History  Procedure Laterality Date  . Carotid endarterectomy Right 2010  . Hand ligament reconstruction Right ?1970's    "pinky"    No Known Allergies  Current Outpatient Prescriptions on File Prior to Visit  Medication Sig Dispense Refill  . aspirin 81 MG chewable tablet Chew 1 tablet (81 mg total) by mouth daily. 30 tablet 3  . atorvastatin (LIPITOR) 80 MG tablet Take 1 tablet (80 mg total) by mouth daily at 6 PM. 30 tablet 0  . benzonatate (TESSALON) 200 MG capsule Take 1 capsule (200 mg total) by mouth 2 (two) times daily. 10 capsule 0  . fluticasone (FLONASE) 50 MCG/ACT nasal spray Place 2 sprays into both nostrils daily. 9.9 g 0  . furosemide (LASIX) 40 MG tablet Take 2 tablets (80 mg total) by mouth daily. x1 week then  daily. 30 tablet 1  . gabapentin (NEURONTIN) 300 MG capsule Take 1 capsule (300 mg total) by mouth 3 (three) times daily. 90 capsule 0  . hydrALAZINE (APRESOLINE) 10 MG tablet Take 1 tablet (10 mg total) by mouth 3 (three) times daily. 90 tablet 0  . isosorbide mononitrate (IMDUR) 60 MG 24 hr tablet Take 1 tablet (60 mg total) by mouth  daily. 30 tablet 0  . potassium chloride SA (K-DUR,KLOR-CON) 20 MEQ tablet Take 1 tablet (20 mEq total) by mouth daily. 30 tablet 0  . predniSONE (DELTASONE) 10 MG tablet Take 4 tablets (40 mg) daily for 2 days, then, Take 3 tablets (30 mg) daily for 2 days, then, Take 2 tablets (20 mg) daily for 2 days, then, Take 1 tablets (10 mg) daily for 1 days, then stop 19 tablet 0  . traMADol (ULTRAM) 50 MG tablet Take 1 tablet (50 mg total) by mouth every 6 (six) hours as needed for moderate pain (cough). 15 tablet 0  . albuterol (PROVENTIL HFA;VENTOLIN HFA) 108 (90 BASE) MCG/ACT inhaler Inhale 2 puffs into the lungs every 6 (six) hours as needed for wheezing or shortness of breath. (Patient not  taking: Reported on 04/12/2015) 1 Inhaler 0  . collagenase (SANTYL) ointment Apply topically daily. (Patient not taking: Reported on 03/28/2015) 15 g 0  . ferrous sulfate 325 (65 FE) MG tablet Take 1 tablet (325 mg total) by mouth 2 (two) times daily with a meal. (Patient not taking: Reported on 04/12/2015) 30 tablet 0  . loratadine (CLARITIN) 10 MG tablet Take 1 tablet (10 mg total) by mouth daily. (Patient not taking: Reported on 04/12/2015) 30 tablet 0  . mometasone-formoterol (DULERA) 100-5 MCG/ACT AERO Inhale 2 puffs into the lungs 2 (two) times daily. (Patient not taking: Reported on 04/12/2015) 1 Inhaler 0  . nicotine (NICODERM CQ - DOSED IN MG/24 HOURS) 21 mg/24hr patch Place 1 patch (21 mg total) onto the skin daily. (Patient not taking: Reported on 04/12/2015) 28 patch 0  . pantoprazole (PROTONIX) 40 MG tablet Take 1 tablet (40 mg total) by mouth daily. (Patient not taking: Reported on 04/12/2015) 30 tablet 0  . tiotropium (SPIRIVA HANDIHALER) 18 MCG inhalation capsule Place 1 capsule (18 mcg total) into inhaler and inhale daily. (Patient not taking: Reported on 04/12/2015) 30 capsule 0   No current facility-administered medications on file prior to visit.      Review of Systems  Constitutional: Negative for activity change and appetite change.  HENT: Negative for sinus pressure and sore throat.   Eyes: Negative for visual disturbance.  Respiratory: Negative for chest tightness and shortness of breath.   Cardiovascular: Negative for chest pain and palpitations.  Gastrointestinal: Negative for abdominal pain and abdominal distention.  Endocrine: Negative for cold intolerance, heat intolerance and polyphagia.  Genitourinary: Negative for dysuria, frequency and difficulty urinating.  Musculoskeletal: Negative for back pain, joint swelling and arthralgias.       Feet  Pain  Skin: Negative for color change.  Neurological: Positive for light-headedness. Negative for dizziness, tremors and weakness.    Psychiatric/Behavioral: Negative for suicidal ideas and behavioral problems.       Objective: Filed Vitals:   04/12/15 1440 04/12/15 1443  BP:  109/75  Pulse:  90  Temp:  97.9 F (36.6 C)  TempSrc:  Oral  Resp:  18  Height: 5\' 9"  (1.753 m)   Weight: 150 lb (68.04 kg)   SpO2:  96%      Physical Exam  Constitutional: He is oriented to person, place, and time. He appears well-developed and well-nourished.  HENT:  Head: Normocephalic and atraumatic.  Right Ear: External ear normal.  Left Ear: External ear normal.  Eyes: Conjunctivae and EOM are normal. Pupils are equal, round, and reactive to light.  Neck: Normal range of motion. Neck supple. No tracheal deviation present.  Cardiovascular: Normal rate, regular rhythm and normal  heart sounds.   No murmur heard. Pulmonary/Chest: Effort normal and breath sounds normal. No respiratory distress. He has no wheezes. He exhibits no tenderness.  Abdominal: Soft. Bowel sounds are normal. He exhibits no mass. There is no tenderness.  Musculoskeletal: Normal range of motion. He exhibits no edema or tenderness.  Neurological: He is alert and oriented to person, place, and time.  Skin:  Left foot-lateral aspect of the foot with ulcer 3 x 3.5 cm and scab over it which is not tender surrounding hyperpigmentation.  Psychiatric: He has a normal mood and affect.          Assessment & Plan:  74 year old male with a history of uncontrolled Type 2 DM, CHF, CAD (who has declined cardiac cath), non compliance with medical management recently hospitalized for respiratory failure secondary to COPD exacerbation here for a follow up visit.   Type 2 DM: Uncontrolled with Hba1c of 9.3,  Refilled Glimepiride which she has not been taking.  COPD exacerbation with respiratory failure Resolved He does not have any of his inhalers with him and I have advised him to bring them all in at his next visit.   CHF: EF 30-35% Continue Daily weights, limit  fluids to 2L/day, low sodium advised Referred to Cardiology Refuses Cardiac Cath Continue Lasix at 40 mg twice daily.  CKD: Will monitor creatinine   Pedal edema: Secondary to CHF and this has improved significantly  Foot ulcer: Referred to wound care.  Polypharmacy: This could explain his dizziness as he has multiple bottles of Lasix and also Coreg and Lisinopril which we do not have on his medication list and I have advised him to hold off on as his BP is on the low side.   Health Care Needs:  Compliance with medications is a major issue due to poor insight; dementia could be contributory. He is refusing visiting nurse to review his meds and case manager and social worker called in to see the patient to help with insurance issues and coordination of care. Transportation arranged to and from office visit

## 2015-04-12 NOTE — Patient Instructions (Signed)
Diabetes Mellitus and Food It is important for you to manage your blood sugar (glucose) level. Your blood glucose level can be greatly affected by what you eat. Eating healthier foods in the appropriate amounts throughout the day at about the same time each day will help you control your blood glucose level. It can also help slow or prevent worsening of your diabetes mellitus. Healthy eating may even help you improve the level of your blood pressure and reach or maintain a healthy weight.  HOW CAN FOOD AFFECT ME? Carbohydrates Carbohydrates affect your blood glucose level more than any other type of food. Your dietitian will help you determine how many carbohydrates to eat at each meal and teach you how to count carbohydrates. Counting carbohydrates is important to keep your blood glucose at a healthy level, especially if you are using insulin or taking certain medicines for diabetes mellitus. Alcohol Alcohol can cause sudden decreases in blood glucose (hypoglycemia), especially if you use insulin or take certain medicines for diabetes mellitus. Hypoglycemia can be a life-threatening condition. Symptoms of hypoglycemia (sleepiness, dizziness, and disorientation) are similar to symptoms of having too much alcohol.  If your health care provider has given you approval to drink alcohol, do so in moderation and use the following guidelines:  Women should not have more than one drink per day, and men should not have more than two drinks per day. One drink is equal to:  12 oz of beer.  5 oz of wine.  1 oz of hard liquor.  Do not drink on an empty stomach.  Keep yourself hydrated. Have water, diet soda, or unsweetened iced tea.  Regular soda, juice, and other mixers might contain a lot of carbohydrates and should be counted. WHAT FOODS ARE NOT RECOMMENDED? As you make food choices, it is important to remember that all foods are not the same. Some foods have fewer nutrients per serving than other  foods, even though they might have the same number of calories or carbohydrates. It is difficult to get your body what it needs when you eat foods with fewer nutrients. Examples of foods that you should avoid that are high in calories and carbohydrates but low in nutrients include:  Trans fats (most processed foods list trans fats on the Nutrition Facts label).  Regular soda.  Juice.  Candy.  Sweets, such as cake, pie, doughnuts, and cookies.  Fried foods. WHAT FOODS CAN I EAT? Have nutrient-rich foods, which will nourish your body and keep you healthy. The food you should eat also will depend on several factors, including:  The calories you need.  The medicines you take.  Your weight.  Your blood glucose level.  Your blood pressure level.  Your cholesterol level. You also should eat a variety of foods, including:  Protein, such as meat, poultry, fish, tofu, nuts, and seeds (lean animal proteins are best).  Fruits.  Vegetables.  Dairy products, such as milk, cheese, and yogurt (low fat is best).  Breads, grains, pasta, cereal, rice, and beans.  Fats such as olive oil, trans fat-free margarine, canola oil, avocado, and olives. DOES EVERYONE WITH DIABETES MELLITUS HAVE THE SAME MEAL PLAN? Because every person with diabetes mellitus is different, there is not one meal plan that works for everyone. It is very important that you meet with a dietitian who will help you create a meal plan that is just right for you. Document Released: 07/19/2005 Document Revised: 10/27/2013 Document Reviewed: 09/18/2013 Exit[employer] Patient Information 2015 [employer]. This   information is not intended to replace advice given to you by your health care provider. Make sure you discuss any questions you have with your health care provider.  

## 2015-04-12 NOTE — Progress Notes (Signed)
ASSESSMENT: Pt currently experiencing motivation to problem-solve issue with insurance. Pt needs to cancel his [employer] insurance and apply for [employer].  Stage of Change: action  PLAN: 1. F/U with behavioral health consultant in as needed 2. Psychiatric Medications: none. 3. Behavioral recommendation(s):   -[employer] [place] insurance -Apply for Medicare  SUBJECTIVE: Pt. referred by [name] for insurance issue:  Pt. here for referral regarding insurance issues.  Pt. reports the following symptoms/concerns: Pt confused about what is going on with his insurance. He states that he is receiving mail from his out-of-state insurance, and he is unsure whether or not it is covering any of his [place] medical bills. Duration of problem: >a year Severity: mild  OBJECTIVE: Orientation & Cognition: Oriented x3. Thought processes normal and appropriate to situation. Mood: appropriate Affect: appropriate Appearance: appropriate Risk of harm to self or others: risk of harm to self or others Substance use: none Psychiatric medication use: Unchanged from prior contact. Assessments administered: none  Diagnosis: Psychosocial problem CPT Code: Z65.8 -------------------------------------------- Other(s) present in the room: none  Time spent with patient in exam room: 30 minutes

## 2015-04-12 NOTE — Progress Notes (Signed)
Patient states he feels "somewhat a little dizzy, but ok.  I was dizzy when I got up this morning."  Patient reports pain in bilateral feet that comes and goes. Pain in feet 6/10 described as throbbing. Gabapentin put patient to sleep.  CBG currently 330.  Patient has not had anything to eat/drink today except for coffee.  Patient brought in bag of current medications.  Lisinopril and Carvedilol were inside of the bag but not on the medication list.  Patient expressed "I don't know what I am taking."  Patient not ready to quit smoking cigarettes.

## 2015-04-14 ENCOUNTER — Telehealth: Payer: Self-pay

## 2015-04-14 NOTE — Telephone Encounter (Signed)
Received call back from patient.  He indicates he is feeling "much better."  He states the pain has decreased significantly in his feet especially when walking.  He also indicates the swelling has decreased significantly in his feet. He indicates he is taking his Lasix as prescribed.  Reminded patient to weigh himself daily and keep log of his daily weights. Patient indicates he does not have a scale. He confirms he can afford a scale and will speak with his niece about taking him to the store to purchase one.  Provided thorough instructions about daily weights, and patient verbalized understanding.  Also reminded patient to limit fluids to 2L/day and to eat a low sodium diet. Patient verbalized understanding. Patient aware of his appointment on 04/19/15 at 1445 and confirms he will be at appointment. Patient indicates transportation needed to his appointment.  Cecilia notified of patient's transportation need and indicated she would arrange needed transportation.

## 2015-04-14 NOTE — Telephone Encounter (Signed)
Call placed to patient to check on condition and to determine if he has gone to Social Security to sign up for Medicare. Unable to reach; left voicemail requesting return call.

## 2015-04-18 ENCOUNTER — Telehealth: Payer: Self-pay

## 2015-04-18 NOTE — Telephone Encounter (Signed)
Called patient to check on his status and to inform him that his transportation for tomorrow has been scheduled and will pick him up at 2:15pm.  CM requested a call back. This CM confirmed the transportation pick up w/ Aldean Ast, Scheduler at [facility].

## 2015-04-18 NOTE — Telephone Encounter (Signed)
Call received from the patient. He said that the swelling in his feet is "way down" and almost gone and he is having very little pain in his feet.  He also noted that his breathing is " fine."  He has not been able to purchase a scale yet and he stated that he has not been to the social security office because he needs to cancel his current insurance policy. He said that he needs to keep trying to reach someone at [employer] to cancel the policy. He also confirmed his appointment for tomorrow at [facility] and knows that his transportation is picking him up at 2:15pm.

## 2015-04-19 ENCOUNTER — Encounter: Payer: Self-pay | Admitting: Family Medicine

## 2015-04-19 ENCOUNTER — Ambulatory Visit: Payer: Medicare (Managed Care) | Attending: Family Medicine | Admitting: Family Medicine

## 2015-04-19 VITALS — BP 130/84 | HR 84 | Temp 97.9°F | Resp 18 | Ht 69.5 in | Wt 151.6 lb

## 2015-04-19 DIAGNOSIS — E1122 Type 2 diabetes mellitus with diabetic chronic kidney disease: Secondary | ICD-10-CM

## 2015-04-19 DIAGNOSIS — N189 Chronic kidney disease, unspecified: Secondary | ICD-10-CM | POA: Diagnosis not present

## 2015-04-19 LAB — BASIC METABOLIC PANEL
BUN: 12 mg/dL (ref 6–23)
CHLORIDE: 92 meq/L — AB (ref 96–112)
CO2: 28 meq/L (ref 19–32)
CREATININE: 1.15 mg/dL (ref 0.50–1.35)
Calcium: 9 mg/dL (ref 8.4–10.5)
Glucose, Bld: 437 mg/dL — ABNORMAL HIGH (ref 70–99)
POTASSIUM: 4.3 meq/L (ref 3.5–5.3)
Sodium: 128 mEq/L — ABNORMAL LOW (ref 135–145)

## 2015-04-19 LAB — GLUCOSE, POCT (MANUAL RESULT ENTRY): POC GLUCOSE: 460 mg/dL — AB (ref 70–99)

## 2015-04-19 MED ORDER — ISOSORBIDE MONONITRATE ER 30 MG PO TB24: 30.0000 mg | ORAL_TABLET | Freq: Every day | ORAL | Status: DC

## 2015-04-19 MED ORDER — INSULIN ASPART 100 UNIT/ML ~~LOC~~ SOLN
10.0000 [IU] | Freq: Once | SUBCUTANEOUS | Status: AC
  Administered 2015-04-19: 10 [IU] via SUBCUTANEOUS

## 2015-04-19 MED ORDER — LISINOPRIL 2.5 MG PO TABS: 2.5000 mg | ORAL_TABLET | Freq: Every day | ORAL | Status: DC

## 2015-04-19 MED ORDER — CARVEDILOL 3.125 MG PO TABS: 3.1250 mg | ORAL_TABLET | Freq: Two times a day (BID) | ORAL | Status: DC

## 2015-04-19 NOTE — Progress Notes (Signed)
Quick Note:  Labs addressed at office as well as medications and patient was made aware. ______ 

## 2015-04-19 NOTE — Progress Notes (Signed)
Patient is here for follow-up. Patient states he feels good today. Patient reports the swelling in his left foot has decreased and the pain has decreased. Patient reports his pain being at a rate of 1. Pain is throbbing and come and goes.   Patients BG is 460. Patient ate two tacos around 2pm.  Patient is concerned about his lack of appetite.

## 2015-04-19 NOTE — Progress Notes (Signed)
Subjective:    Patient ID: Russell Hamilton, male    DOB: 01-Mar-1941, 74 y.o.   MRN: [id number]   [facility]  Date of Telephone call: 04/04/15 Date of 1st service:04/05/15  Admit Date:03/26/15 Discharge Date: 04/01/15  PCP: None  HPI  Russell Hamilton is a 74 year old male with a history of uncontrolled Type 2 DM, CHF, CAD (who has declined cardiac cath), non compliance with medical management recently hospitalized for respiratory failure secondary to COPD exacerbation.  He comes in here for medication management therapy and has his bottles with him but he tells me he hasn't taken any please medications today because he usually waits until after his doctor visits his blood sugar is elevated at 460.  He continues to refuse services of a home nurse to help with medication administration.    He has a foot ulcer on the left foot and is scheduled to see 1 3 weeks and has no complaints at the moment.     Review of Systems  General: negative for fever, weight loss, appetite change Eyes: no visual symptoms. ENT: no ear symptoms, no sinus tenderness, no nasal congestion or sore throat. Neck: no pain  Respiratory: no wheezing, shortness of breath, cough Cardiovascular: no chest pain, no dyspnea on exertion, no pedal edema, no orthopnea. Gastrointestinal: no abdominal pain, no diarrhea, no constipation Genito-Urinary: no urinary frequency, no dysuria, no polyuria. Hematologic: no bruising Endocrine: no cold or heat intolerance Neurological: no headaches, no seizures, no tremors Musculoskeletal: no joint pains, no joint swelling Skin:see hpi       Filed Vitals:   04/19/15 1438  BP: 130/84  Pulse: 84  Temp: 97.9 F (36.6 C)  TempSrc: Oral  Resp: 18  Height: 5' 9.5" (1.765 m)  Weight: 151 lb 9.6 oz (68.765 kg)  SpO2: 98%      Physical Exam  Constitutional: normal appearing,  Eyes: PERRLA HEENT: Head is atraumatic, normal sinuses, normal oropharynx, normal appearing tonsils  and palate, tympanic membrane is normal bilaterally. Neck: normal range of motion, no thyromegaly, no JVD Cardiovascular: normal rate and rhythm, normal heart sounds, no murmurs, rub or gallop, no pedal edema Respiratory: clear to auscultation bilaterally, no wheezes, no rales, no rhonchi Abdomen: soft, not tender to palpation, normal bowel sounds, no enlarged organs Extremities: Full ROM, no tenderness in joints Skin: left foot with ulcer on lateral aspect with superficial scab, yellowish slightly tender to deep palpation, no discharge. Surrounding hyperpigmentation.         Assessment & Plan:  74 year old male with a history of uncontrolled Type 2 DM, CHF, CAD (who has declined cardiac cath), non compliance with medical management recently hospitalized for respiratory failure secondary to COPD here for a follow up visit and medication management.   Type 2 DM: Uncontrolled with Hba1c of 9.3, CBG is 460 in the clinic. He is yet to take his Glimepiride. Novolog 10 units given , repeat blood sugar; advised not to take Glimepiride until tomorrow morning to prevent hypoglycemia.  COPD  States he does not need his inhalers and is doing well.   CHF: EF 30-35% Continue Daily weights, limit fluids to 2L/day, low sodium advised Referred to Cardiology Refuses Cardiac Cath Continue Lasix at 40 mg twice daily.  CKD: Will monitor creatinine, BMET today.  Foot ulcer: Has an upcoming appointment with wound care; dressing change today.   Hypertension: Blood pressure is normal despite the fact that he is not taking lisinopril, Coreg, and Hydralazine and  so I have  Reduced Lisinopril to 2.5 mg , Coreg reduced to 3.125 mg and hydralazine to 5 mg.  Health Care Needs:  Compliance with medications is a major issue due to poor insight; dementia could be contributory. He is refusing visiting nurse to review his meds and case manager and social worker called in to see the patient to help with  insurance issues and coordination of care. Transportation arranged to and from office visit

## 2015-04-19 NOTE — Patient Outreach (Signed)
[facility]) Care Management  04/19/2015  Xaiver Se[place]ins [birth year] [phone]   Referral MD via Ellyn Hack, RN, patient is not eligible for [facility] Care Management services at this time.  However, Wynonia Lawman, RN to outreach patient for resources as assist with patient needs.  Corrie Mckusick. Sharlee Blew [facility] Care Management [facility] CM Assistant Phone: [phone] Fax: [phone]

## 2015-04-19 NOTE — Patient Instructions (Signed)
Diabetes Mellitus and Food It is important for you to manage your blood sugar (glucose) level. Your blood glucose level can be greatly affected by what you eat. Eating healthier foods in the appropriate amounts throughout the day at about the same time each day will help you control your blood glucose level. It can also help slow or prevent worsening of your diabetes mellitus. Healthy eating may even help you improve the level of your blood pressure and reach or maintain a healthy weight.  HOW CAN FOOD AFFECT ME? Carbohydrates Carbohydrates affect your blood glucose level more than any other type of food. Your dietitian will help you determine how many carbohydrates to eat at each meal and teach you how to count carbohydrates. Counting carbohydrates is important to keep your blood glucose at a healthy level, especially if you are using insulin or taking certain medicines for diabetes mellitus. Alcohol Alcohol can cause sudden decreases in blood glucose (hypoglycemia), especially if you use insulin or take certain medicines for diabetes mellitus. Hypoglycemia can be a life-threatening condition. Symptoms of hypoglycemia (sleepiness, dizziness, and disorientation) are similar to symptoms of having too much alcohol.  If your health care provider has given you approval to drink alcohol, do so in moderation and use the following guidelines:  Women should not have more than one drink per day, and men should not have more than two drinks per day. One drink is equal to:  12 oz of beer.  5 oz of wine.  1 oz of hard liquor.  Do not drink on an empty stomach.  Keep yourself hydrated. Have water, diet soda, or unsweetened iced tea.  Regular soda, juice, and other mixers might contain a lot of carbohydrates and should be counted. WHAT FOODS ARE NOT RECOMMENDED? As you make food choices, it is important to remember that all foods are not the same. Some foods have fewer nutrients per serving than other  foods, even though they might have the same number of calories or carbohydrates. It is difficult to get your body what it needs when you eat foods with fewer nutrients. Examples of foods that you should avoid that are high in calories and carbohydrates but low in nutrients include:  Trans fats (most processed foods list trans fats on the Nutrition Facts label).  Regular soda.  Juice.  Candy.  Sweets, such as cake, pie, doughnuts, and cookies.  Fried foods. WHAT FOODS CAN I EAT? Have nutrient-rich foods, which will nourish your body and keep you healthy. The food you should eat also will depend on several factors, including:  The calories you need.  The medicines you take.  Your weight.  Your blood glucose level.  Your blood pressure level.  Your cholesterol level. You also should eat a variety of foods, including:  Protein, such as meat, poultry, fish, tofu, nuts, and seeds (lean animal proteins are best).  Fruits.  Vegetables.  Dairy products, such as milk, cheese, and yogurt (low fat is best).  Breads, grains, pasta, cereal, rice, and beans.  Fats such as olive oil, trans fat-free margarine, canola oil, avocado, and olives. DOES EVERYONE WITH DIABETES MELLITUS HAVE THE SAME MEAL PLAN? Because every person with diabetes mellitus is different, there is not one meal plan that works for everyone. It is very important that you meet with a dietitian who will help you create a meal plan that is just right for you. Document Released: 07/19/2005 Document Revised: 10/27/2013 Document Reviewed: 09/18/2013 Exit[employer] Patient Information 2015 [employer]. This   information is not intended to replace advice given to you by your health care provider. Make sure you discuss any questions you have with your health care provider.  

## 2015-04-22 ENCOUNTER — Telehealth: Payer: Self-pay

## 2015-04-22 NOTE — Telephone Encounter (Signed)
Call placed to patient to remind of appointment on 04/26/15 at 1530 and to determine how he is feeling. Patient indicates he is doing well:  the swelling in his feet is almost gone and he is having very little pain in his feet.  Patient denies shortness of breath.  Discussed patient's prescribed medications, and patient indicates he is taking them as prescribed. Informed patient to bring all medications once again to appointment. Patient verbalized understanding and indicates he will need transportation to appointment.  Cecilia updated and indicates she will arranged needed transportation for patient.

## 2015-04-26 ENCOUNTER — Encounter: Payer: Self-pay | Admitting: Family Medicine

## 2015-04-26 ENCOUNTER — Other Ambulatory Visit: Payer: Self-pay | Admitting: *Deleted

## 2015-04-26 ENCOUNTER — Other Ambulatory Visit: Payer: Self-pay

## 2015-04-26 ENCOUNTER — Ambulatory Visit: Payer: Medicare (Managed Care) | Attending: Family Medicine | Admitting: Family Medicine

## 2015-04-26 ENCOUNTER — Encounter: Payer: Self-pay | Admitting: *Deleted

## 2015-04-26 VITALS — BP 143/94 | HR 99 | Temp 98.1°F | Resp 18 | Ht 69.5 in | Wt 167.0 lb

## 2015-04-26 DIAGNOSIS — L97521 Non-pressure chronic ulcer of other part of left foot limited to breakdown of skin: Secondary | ICD-10-CM | POA: Diagnosis not present

## 2015-04-26 DIAGNOSIS — J440 Chronic obstructive pulmonary disease with acute lower respiratory infection: Secondary | ICD-10-CM | POA: Diagnosis not present

## 2015-04-26 DIAGNOSIS — G629 Polyneuropathy, unspecified: Secondary | ICD-10-CM

## 2015-04-26 DIAGNOSIS — R42 Dizziness and giddiness: Secondary | ICD-10-CM

## 2015-04-26 DIAGNOSIS — B353 Tinea pedis: Secondary | ICD-10-CM

## 2015-04-26 DIAGNOSIS — E1165 Type 2 diabetes mellitus with hyperglycemia: Secondary | ICD-10-CM | POA: Diagnosis not present

## 2015-04-26 LAB — GLUCOSE, POCT (MANUAL RESULT ENTRY): POC Glucose: 278 mg/dl — AB (ref 70–99)

## 2015-04-26 MED ORDER — ACCU-CHEK MULTICLIX LANCETS MISC: Status: DC

## 2015-04-26 MED ORDER — GLUCOSE BLOOD VI STRP: ORAL_STRIP | Status: DC

## 2015-04-26 MED ORDER — ACCU-CHEK AVIVA DEVI: Status: DC

## 2015-04-26 MED ORDER — CLOTRIMAZOLE 1 % EX CREA: 1.0000 "application " | TOPICAL_CREAM | Freq: Two times a day (BID) | CUTANEOUS | Status: DC

## 2015-04-26 NOTE — Progress Notes (Signed)
Patient ID: Russell Hamilton, male   DOB: 01-02-1941, 74 y.o.   MRN: [id number]   Patient required assistance in cancelling his medicare from [place].  A letter was drafted at the clinic and patient agreed to sign the letter indicating he wanted to cancel.  Letter will be faxed to the insurance company on patient's behalf.  It was explained to patient, per [name], that he would automatically be enrolled in [place] medicare part A/B but that he would need to enroll in a Part D.  Patient verbalized understanding and letter faxed.

## 2015-04-26 NOTE — Patient Outreach (Signed)
[facility]) Care Management  04/26/2015  Didier Brumley 07-05-1941 [id number]  Referral was made from [facility] and Wellness center to contact patient.  Patient is not eligible for [facility] services, however RN CM made outreach call to speak with patient.  RN CM called to see what patient's needs are and what community resources patient could benefit from. The referral asked that patient be assisted with education related to congestive heart failure and diabetes.  HIPPA compliant voice mail message left on patient's phone with return call back number.  RN CM will try again at a later date.   Wynonia Lawman, RN, Lowella Dell, [facility] Telephonic Care Coordinator [phone]

## 2015-04-26 NOTE — Progress Notes (Signed)
Patient here for follow up. Patient reports feeling dizzy today. Patient has been feeling dizzy for about a week. Patient reports the dizziness comes and goes. Patient reports it was so bad he could not get out of bed the day before yesterday and didn't eat at all. Yesterday the patient ate very little during the day. Patient denies any pain today. Patient reports that he can finally feel his toes and move them a little bit. Patient reports that he has been able to wash between his toes now and that the swelling has decreased a lot.   Patient CBG is 278. Patient only had breakfast around 12pm. Patient has not taken any of his medications today. Patient reports he has not taken any medications for the last two days.

## 2015-04-26 NOTE — Progress Notes (Signed)
Subjective:    Patient ID: Russell Hamilton, male    DOB: 1940-12-29, 74 y.o.   MRN: [id number]   Date of Telephone call: 04/04/15 Date of 1st service:04/05/15  Admit Date:03/26/15 Discharge Date: 04/01/15  PCP: None  Date of first face-to-face visit: 04/05/15    HPI  Russell Hamilton 74 year old male who has been seen at the [facility] and medical history is notable for of uncontrolled Type 2 DM (A1c 9.3), CHF, CAD (who has declined cardiac cath), non compliance with medical management recently hospitalized for respiratory failure secondary to COPD exacerbation.  He has had multiple visits for medication management therapy due to noncompliance and has been found to have hyperglycemia and needing insulin administration in the clinic. We have discussed getting a home nurse for him but he does not want anyone in his home. 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 The pedal edema he previously complained of has resolved.  He tells me he has been compliant with all of his medications until 2 days ago when he started feeling dizzy, admits to not eating all day that day and so he stopped his medications for two days. He admits to previous history of vertigo for which he had physical therapy resulted in resolution of symptoms and he is adamant to being placed on meclizine today.  Denies chest pain, shortness of breath, hearing loss.   Past Medical History  Diagnosis Date  . Hypertension   . Chronic systolic CHF (congestive heart failure)     a. 2D ECHO 08/13/14: EF 40%, diffuse hypokinesis possibly worse in the inferior wall. Mild MR, mild LA dilation, PA pressure 50. Trivial pericardial effusion.  . Acute systolic CHF (congestive heart failure), NYHA class 4 08/12/2014  . Type II diabetes mellitus   . GERD (gastroesophageal reflux disease)   . Peripheral neuropathy     [name] 08/12/2014  . Stroke 2010   "memory problems since; right leg and left arm weakness since" (02/08/2015)  . Ulcers of both lower extremities     [name] 02/24/2015  . Noncompliance     [name] 02/24/2015  . COPD (chronic obstructive pulmonary disease)     Past Surgical History  Procedure Laterality Date  . Carotid endarterectomy Right 2010  . Hand ligament reconstruction Right ?1970's    "pinky"    Family History  Problem Relation Age of Onset  . Kidney disease Mother     No Known Allergies  Current Outpatient Prescriptions on File Prior to Visit  Medication Sig Dispense Refill  . aspirin 81 MG chewable tablet Chew 1 tablet (81 mg total) by mouth daily. 30 tablet 3  . atorvastatin (LIPITOR) 80 MG tablet Take 1 tablet (80 mg total) by mouth daily at 6 PM. 30 tablet 0  . carvedilol (COREG) 3.125 MG tablet Take 1 tablet (3.125 mg total) by mouth 2 (two) times daily with a meal. 60 tablet 2  . collagenase (SANTYL) ointment Apply topically daily. 15 g 0  . ferrous sulfate 325 (65 FE) MG tablet Take 1 tablet (325 mg total) by mouth 2 (two) times daily with a meal. 30 tablet 0  . furosemide (LASIX) 40 MG tablet Take 2 tablets (80 mg total) by mouth daily. x1 week then 40mg  daily. 30 tablet 1  . gabapentin (NEURONTIN) 300 MG capsule Take 1 capsule (300 mg total) by mouth 3 (three)  times daily. 90 capsule 0  . glimepiride (AMARYL) 4 MG tablet Take 1 tablet (4 mg total) by mouth daily with breakfast. 30 tablet 2  . hydrALAZINE (APRESOLINE) 10 MG tablet Take 1 tablet (10 mg total) by mouth 3 (three) times daily. 90 tablet 0  . isosorbide mononitrate (IMDUR) 30 MG 24 hr tablet Take 1 tablet (30 mg total) by mouth daily. 30 tablet 2  . lisinopril (PRINIVIL,ZESTRIL) 2.5 MG tablet Take 1 tablet (2.5 mg total) by mouth daily. 30 tablet 2  . mometasone-formoterol (DULERA) 100-5 MCG/ACT AERO Inhale 2 puffs into the lungs 2 (two) times daily. 1 Inhaler 0  . nicotine (NICODERM CQ - DOSED IN MG/24 HOURS) 21 mg/24hr patch Place 1 patch  (21 mg total) onto the skin daily. 28 patch 0  . pantoprazole (PROTONIX) 40 MG tablet Take 1 tablet (40 mg total) by mouth daily. 30 tablet 0  . potassium chloride SA (K-DUR,KLOR-CON) 20 MEQ tablet Take 1 tablet (20 mEq total) by mouth daily. 30 tablet 0  . tiotropium (SPIRIVA HANDIHALER) 18 MCG inhalation capsule Place 1 capsule (18 mcg total) into inhaler and inhale daily. 30 capsule 0  . traMADol (ULTRAM) 50 MG tablet Take 1 tablet (50 mg total) by mouth every 6 (six) hours as needed for moderate pain (cough). 15 tablet 0  . albuterol (PROVENTIL HFA;VENTOLIN HFA) 108 (90 BASE) MCG/ACT inhaler Inhale 2 puffs into the lungs every 6 (six) hours as needed for wheezing or shortness of breath. (Patient not taking: Reported on 04/26/2015) 1 Inhaler 0   No current facility-administered medications on file prior to visit.    Review of Systems  Constitutional: Negative for activity change and appetite change.  HENT: Negative for sinus pressure and sore throat.   Eyes: Negative for visual disturbance.  Respiratory: Negative for chest tightness and shortness of breath.   Cardiovascular: Negative for chest pain and palpitations.  Gastrointestinal: Negative for abdominal pain and abdominal distention.  Endocrine: Negative for cold intolerance, heat intolerance and polyphagia.  Genitourinary: Negative for dysuria, frequency and difficulty urinating.  Musculoskeletal: Negative for back pain, joint swelling and arthralgias.  Skin: Negative for color change.  Neurological: Positive for light-headedness. Negative for dizziness, tremors and weakness.  Psychiatric/Behavioral: Negative for suicidal ideas and behavioral problems.         Objective: Filed Vitals:   04/26/15 1504  BP: 143/94  Pulse: 99  Temp: 98.1 F (36.7 C)  TempSrc: Oral  Resp: 18  Height: 5' 9.5" (1.765 m)  Weight: 167 lb (75.751 kg)  SpO2: 94%      Physical Exam  Constitutional: He is oriented to person, place, and time. He  appears well-developed and well-nourished.  Neck: Normal range of motion. Neck supple. No tracheal deviation present.  Cardiovascular: Normal rate, regular rhythm and normal heart sounds.   No murmur heard. Pulmonary/Chest: Effort normal and breath sounds normal. No respiratory distress. He has no wheezes. He exhibits no tenderness.  Abdominal: Soft. Bowel sounds are normal. He exhibits no mass. There is no tenderness.  Musculoskeletal: Normal range of motion. He exhibits no edema or tenderness.  Neurological: He is alert and oriented to person, place, and time.  Skin:  Left foot: tinea pedis intermittent fourth web spaces of toes and lateral aspect of foot with 3 x 4 cm ulceration with hyperpigmented scab over it and no discharge. Xerosis on the dorsum of foot. Right foot: Xerosis on dorsum of foot.  Psychiatric: He has a normal mood and affect.  CMP Latest Ref Rng 04/19/2015 04/01/2015 03/31/2015  Glucose 70 - 99 mg/dL 045(W) 098(J) 191(Y)  BUN 6 - 23 mg/dL 12 78(G) 95(A)  Creatinine 0.50 - 1.35 mg/dL 2.13 0.86 5.78  Sodium 135 - 145 mEq/L 128(L) 131(L) 130(L)  Potassium 3.5 - 5.3 mEq/L 4.3 3.7 4.4  Chloride 96 - 112 mEq/L 92(L) 98(L) 101  CO2 19 - 32 mEq/L Calcium 8.4 - 10.5 mg/dL 9.0 4.6(N) 8.3(L)  Total Protein 6.0 - 8.3 g/dL - - -  Total Bilirubin 0.3 - 1.2 mg/dL - - -  Alkaline Phos 39 - 117 U/L - - -  AST 0 - 37 U/L - - -  ALT 0 - 53 U/L - - -           Assessment & Plan:  74 year old male with a history of uncontrolled Type 2 DM,HTN, CHF, CAD (who has declined cardiac cath), non compliance with medical management, recently hospitalization for respiratory failure secondary to COPD exacerbation who has been followed in the [facility].   Type 2 DM: Uncontrolled with Hba1c of 9.3, CBG reveals improvement Continue Glimepiride ; prescription for meter test strips and lancets sent to his pharmacy  COPD: Stable  He is refusing to use his inhalers  as he feels he is doing fine off them.  CHF: EF 30-35%, no evidence of fluid overload.  Continue Daily weights, limit fluids to 2L/day, low sodium advised Refuses Cardiac Cath Continue Lasix  CKD: Stable. He will need a CMET at next visit to monitor creatinine.  Foot ulcer: Ulcer on lateral aspect of left foot and he is advised to keep his appointment with wound care on 05/11/15  Dizziness/vertigo: I have discussed initiating meclizine with him which he is refusing; hypoglycemia ruled out I am also unable to ascertain if this could be secondary to his antihypertensives as he did not take his antihypertensives today but have advised him to check his blood pressure at local pharmacy stores and give the clinic called with these readings.  Hypertension: Controlled.  Health Care Needs:  Compliance with medications is a major issue due to poor insight; dementia could be contributory. He is refusing services of visiting nurse to help with medication review ;social worker called in to assist the patient with switching his insurance from [place] group in [place] to one in [place].  Transportation arranged to and from office visit

## 2015-04-27 ENCOUNTER — Other Ambulatory Visit: Payer: Self-pay

## 2015-04-27 NOTE — Progress Notes (Signed)
Quick Note:  Labs addressed at office as well as medications and patient was made aware. ______ 

## 2015-04-27 NOTE — Patient Outreach (Signed)
[facility]) Care Management  04/27/2015  Russell Hamilton 12/17/1940 [id number]   RN CM has made 2 attempts to reach this patient.  Patient was referred by [facility] and Wellness clinic.  Patient is not eligible for [facility] services, however RN CM was outreaching patient to see what his needs are and to provide community resources that may help resolve some of his needs.  RN CM will try back at a later date.  Wynonia Lawman, RN, Lowella Dell, [facility] Telephonic Care Coordinator [phone]

## 2015-04-28 ENCOUNTER — Other Ambulatory Visit: Payer: Self-pay

## 2015-04-28 NOTE — Patient Outreach (Signed)
[facility]) Care Management  04/28/2015  Ryer Kerwick 1941/10/09 [id number]   RN CM has attempted three outreach calls to this patient regarding referral from [facility] and Wellness clinic.  All attempts to reach this patient have been unsuccessful.  RN CM has left several HIPPA compliant voice mail messages with return call back number.  RN CM will close this case reason being unable to contact or maintain contact with patient.     Wynonia Lawman, RN, Lowella Dell, [facility] Telephonic Care Coordinator [phone]

## 2015-04-29 NOTE — Patient Outreach (Signed)
[facility]) Care Management  04/29/2015  Russell Hamilton 23-Oct-1941 [id number]   RN CM has been unsuccessful in reaching this patient.  Several voice mail messages have been left with return call back number.  Outreach calls made to patient to assist him with community resources.  Referral was made by [facility] and Wellness clinic.  Patient is not eligible for [facility] services. RN CM will close this case due to inability to establish contact with this patient.  Wynonia Lawman, RN, Lowella Dell, [facility] Telephonic Care Coordinator [phone]

## 2015-05-05 ENCOUNTER — Telehealth: Payer: Self-pay

## 2015-05-05 NOTE — Telephone Encounter (Signed)
Patient called returning call. Please f/u

## 2015-05-05 NOTE — Telephone Encounter (Signed)
Call placed to patient to follow-up and remind him of appointment on 05/06/15 at 1500 with Holland CommonsValerie Keck, NP to establish care.  Unable to reach patient; left voicemail requesting return call.

## 2015-05-06 ENCOUNTER — Encounter: Payer: Self-pay | Admitting: Internal Medicine

## 2015-05-06 ENCOUNTER — Ambulatory Visit: Payer: Medicare Other | Attending: Internal Medicine | Admitting: Internal Medicine

## 2015-05-06 ENCOUNTER — Telehealth: Payer: Self-pay

## 2015-05-06 VITALS — BP 114/75 | HR 75 | Temp 98.2°F | Resp 18 | Wt 147.0 lb

## 2015-05-06 DIAGNOSIS — F17210 Nicotine dependence, cigarettes, uncomplicated: Secondary | ICD-10-CM | POA: Diagnosis not present

## 2015-05-06 DIAGNOSIS — N189 Chronic kidney disease, unspecified: Secondary | ICD-10-CM | POA: Insufficient documentation

## 2015-05-06 DIAGNOSIS — Z79899 Other long term (current) drug therapy: Secondary | ICD-10-CM | POA: Insufficient documentation

## 2015-05-06 DIAGNOSIS — I951 Orthostatic hypotension: Secondary | ICD-10-CM | POA: Diagnosis not present

## 2015-05-06 DIAGNOSIS — K219 Gastro-esophageal reflux disease without esophagitis: Secondary | ICD-10-CM | POA: Insufficient documentation

## 2015-05-06 DIAGNOSIS — I5042 Chronic combined systolic (congestive) and diastolic (congestive) heart failure: Secondary | ICD-10-CM | POA: Diagnosis not present

## 2015-05-06 DIAGNOSIS — I5043 Acute on chronic combined systolic (congestive) and diastolic (congestive) heart failure: Secondary | ICD-10-CM

## 2015-05-06 DIAGNOSIS — E1122 Type 2 diabetes mellitus with diabetic chronic kidney disease: Secondary | ICD-10-CM | POA: Insufficient documentation

## 2015-05-06 DIAGNOSIS — J449 Chronic obstructive pulmonary disease, unspecified: Secondary | ICD-10-CM | POA: Diagnosis not present

## 2015-05-06 DIAGNOSIS — Z7982 Long term (current) use of aspirin: Secondary | ICD-10-CM | POA: Insufficient documentation

## 2015-05-06 DIAGNOSIS — I129 Hypertensive chronic kidney disease with stage 1 through stage 4 chronic kidney disease, or unspecified chronic kidney disease: Secondary | ICD-10-CM | POA: Diagnosis not present

## 2015-05-06 DIAGNOSIS — Z8673 Personal history of transient ischemic attack (TIA), and cerebral infarction without residual deficits: Secondary | ICD-10-CM | POA: Insufficient documentation

## 2015-05-06 DIAGNOSIS — G629 Polyneuropathy, unspecified: Secondary | ICD-10-CM | POA: Diagnosis not present

## 2015-05-06 DIAGNOSIS — R42 Dizziness and giddiness: Secondary | ICD-10-CM

## 2015-05-06 DIAGNOSIS — L97521 Non-pressure chronic ulcer of other part of left foot limited to breakdown of skin: Secondary | ICD-10-CM | POA: Insufficient documentation

## 2015-05-06 LAB — GLUCOSE, POCT (MANUAL RESULT ENTRY): POC Glucose: 345 mg/dl — AB (ref 70–99)

## 2015-05-06 LAB — POCT GLYCOSYLATED HEMOGLOBIN (HGB A1C): HEMOGLOBIN A1C: 11.5

## 2015-05-06 MED ORDER — INSULIN ASPART 100 UNIT/ML ~~LOC~~ SOLN
10.0000 [IU] | Freq: Once | SUBCUTANEOUS | Status: AC
  Administered 2015-05-06: 10 [IU] via SUBCUTANEOUS

## 2015-05-06 MED ORDER — SITAGLIPTIN PHOSPHATE 50 MG PO TABS: 50.0000 mg | ORAL_TABLET | Freq: Every day | ORAL | Status: DC

## 2015-05-06 MED ORDER — GABAPENTIN 300 MG PO CAPS: 600.0000 mg | ORAL_CAPSULE | Freq: Three times a day (TID) | ORAL | Status: DC

## 2015-05-06 NOTE — Progress Notes (Signed)
Patient ID: Russell Hamilton, male   DOB: 1941-03-16, 74 y.o.   MRN: [id number]  CC: DM, CHF  HPI: Russell Hamilton is a 74 y.o. male here today for a follow up visit.  Patient has past medical history of HTN, T2DM, CHF, past CVA, COPD, and medication non-compliance. Patient has been seen several times by Dr. Venetia Night in the Transitional care clinic. Patient presents today with a elevated blood sugar of 330. Has not taken Amaryl today. He reports that he has been unable to check his sugars because he is out of Lancets and does not know what machine he has. He is currently waiting for a wound care appointment for the ulcer on his left lateral foot. He reports that the foot is only slightly painful since he has limited sensation in his foot.  Patient reports that he has lost weight since being on diuretics for CHF. He has improvement in breathing and has not had to use inhalers since released from hospital in May.He currently smokes 1/2 ppd and he is not ready to quit at this time. He notes some dizziness that feels like the room is spinning. No syncopal episodes, PND, orthopnea, or wheezing. He denies blood in stools.   Patient has No headache, No chest pain, No abdominal pain - No Nausea, No new weakness tingling or numbness, No Cough - SOB.  No Known Allergies Past Medical History  Diagnosis Date  . Hypertension   . Chronic systolic CHF (congestive heart failure)     a. 2D ECHO 08/13/14: EF 40%, diffuse hypokinesis possibly worse in the inferior wall. Mild MR, mild LA dilation, PA pressure 50. Trivial pericardial effusion.  . Acute systolic CHF (congestive heart failure), NYHA class 4 08/12/2014  . Type II diabetes mellitus   . GERD (gastroesophageal reflux disease)   . Peripheral neuropathy     [name] 08/12/2014  . Stroke 2010    "memory problems since; right leg and left arm weakness since" (02/08/2015)  . Ulcers of both lower extremities     [name] 02/24/2015  . Noncompliance     [name] 02/24/2015  . COPD  (chronic obstructive pulmonary disease)    Current Outpatient Prescriptions on File Prior to Visit  Medication Sig Dispense Refill  . aspirin 81 MG chewable tablet Chew 1 tablet (81 mg total) by mouth daily. 30 tablet 3  . atorvastatin (LIPITOR) 80 MG tablet Take 1 tablet (80 mg total) by mouth daily at 6 PM. 30 tablet 0  . Blood Glucose Monitoring Suppl (ACCU-CHEK AVIVA) device Use as instructed 1 each 0  . carvedilol (COREG) 3.125 MG tablet Take 1 tablet (3.125 mg total) by mouth 2 (two) times daily with a meal. 60 tablet 2  . clotrimazole (LOTRIMIN) 1 % cream Apply 1 application topically 2 (two) times daily. 45 g 1  . collagenase (SANTYL) ointment Apply topically daily. 15 g 0  . ferrous sulfate 325 (65 FE) MG tablet Take 1 tablet (325 mg total) by mouth 2 (two) times daily with a meal. 30 tablet 0  . furosemide (LASIX) 40 MG tablet Take 2 tablets (80 mg total) by mouth daily. x1 week then  daily. 30 tablet 1  . gabapentin (NEURONTIN) 300 MG capsule Take 1 capsule (300 mg total) by mouth 3 (three) times daily. 90 capsule 0  . glimepiride (AMARYL) 4 MG tablet Take 1 tablet (4 mg total) by mouth daily with breakfast. 30 tablet 2  . glucose blood (ACCU-CHEK AVIVA) test strip Use as instructed 100  each 12  . hydrALAZINE (APRESOLINE) 10 MG tablet Take 1 tablet (10 mg total) by mouth 3 (three) times daily. 90 tablet 0  . isosorbide mononitrate (IMDUR) 30 MG 24 hr tablet Take 1 tablet (30 mg total) by mouth daily. 30 tablet 2  . Lancets (ACCU-CHEK MULTICLIX) lancets Use as instructed 100 each 12  . lisinopril (PRINIVIL,ZESTRIL) 2.5 MG tablet Take 1 tablet (2.5 mg total) by mouth daily. 30 tablet 2  . mometasone-formoterol (DULERA) 100-5 MCG/ACT AERO Inhale 2 puffs into the lungs 2 (two) times daily. 1 Inhaler 0  . nicotine (NICODERM CQ - DOSED IN MG/24 HOURS) 21 mg/24hr patch Place 1 patch (21 mg total) onto the skin daily. 28 patch 0  . pantoprazole (PROTONIX) 40 MG tablet Take 1 tablet (40 mg  total) by mouth daily. 30 tablet 0  . potassium chloride SA (K-DUR,KLOR-CON) 20 MEQ tablet Take 1 tablet (20 mEq total) by mouth daily. 30 tablet 0  . tiotropium (SPIRIVA HANDIHALER) 18 MCG inhalation capsule Place 1 capsule (18 mcg total) into inhaler and inhale daily. 30 capsule 0  . traMADol (ULTRAM) 50 MG tablet Take 1 tablet (50 mg total) by mouth every 6 (six) hours as needed for moderate pain (cough). 15 tablet 0  . albuterol (PROVENTIL HFA;VENTOLIN HFA) 108 (90 BASE) MCG/ACT inhaler Inhale 2 puffs into the lungs every 6 (six) hours as needed for wheezing or shortness of breath. (Patient not taking: Reported on 04/26/2015) 1 Inhaler 0   No current facility-administered medications on file prior to visit.   Family History  Problem Relation Age of Onset  . Kidney disease Mother    History   Social History  . Marital Status: Married    Spouse Name: N/A  . Number of Children: N/A  . Years of Education: N/A   Occupational History  . Not on file.   Social History Main Topics  . Smoking status: Current Every Day Smoker -- 0.50 packs/day for 56 years    Types: Cigarettes  . Smokeless tobacco: Never Used  . Alcohol Use: No     Comment: 02/08/2015 "I might have a beer q 6 months"  . Drug Use: Yes    Special: Marijuana     Comment: last marijuana use 04/22/15  . Sexual Activity: Not Currently   Other Topics Concern  . Not on file   Social History Narrative    Review of Systems: See HPI   Objective:   Filed Vitals:   05/06/15 1518  BP: 114/75  Pulse: 75  Temp: 98.2 F (36.8 C)  Resp: 18    Physical Exam  Constitutional: He is oriented to person, place, and time.  Cardiovascular: Normal rate, regular rhythm and normal heart sounds.   Pulses:      Dorsalis pedis pulses are 1+ on the right side, and 1+ on the left side.       Posterior tibial pulses are 1+ on the right side, and 1+ on the left side.  Pulmonary/Chest: Effort normal and breath sounds normal. He has no  wheezes. He has no rales.  Musculoskeletal: He exhibits no edema or tenderness.  3*4 cm necrotic area on left lateral foot   Feet:  Right Foot:  Skin Integrity: Negative for ulcer.  Left Foot:  Skin Integrity: Positive for ulcer.  Neurological: He is alert and oriented to person, place, and time. He has normal reflexes.     Lab Results  Component Value Date   WBC 10.5 04/01/2015  HGB 12.3* 04/01/2015   HCT 37.9* 04/01/2015   MCV 89.4 04/01/2015   PLT 175 04/01/2015   Lab Results  Component Value Date   CREATININE 1.15 04/19/2015   BUN 12 04/19/2015   NA 128* 04/19/2015   K 4.3 04/19/2015   CL 92* 04/19/2015   CO2 28 04/19/2015    Lab Results  Component Value Date   HGBA1C 11.50 05/06/2015   Lipid Panel     Component Value Date/Time   CHOL 167 11/20/2014 0420   TRIG 85 11/20/2014 0420   HDL 55 11/20/2014 0420   CHOLHDL 3.0 11/20/2014 0420   VLDL 17 11/20/2014 0420   LDLCALC 95 11/20/2014 0420       Assessment and plan:   Huel was seen today for established care.  Diagnoses and all orders for this visit:  Type 2 diabetes mellitus with diabetic chronic kidney disease Orders: -     POCT glycosylated hemoglobin (Hb A1C) -     Glucose (CBG) -     insulin aspart (novoLOG) injection 10 Units; Inject 0.1 mLs (10 Units total) into the skin once. -     Microalbumin, urine -    Begin sitaGLIPtin (JANUVIA) 50 MG tablet; Take 1 tablet (50 mg total) by mouth daily. For diabetes -     Ambulatory referral to Ophthalmology Patient refuses insulin and states that he cannot use needles because he is afraid. Will place patient on Januvia with Amaryl for better control. I have explained to patient that strict glycemic control is needed for better wound healing. Went over long term complications of uncontrolled diabetes.   Acute on chronic combined systolic and diastolic heart failure Daily weights. Continue medication regimen. F/u with Cardiology   Peripheral  neuropathy Orders: -     Increased gabapentin (NEURONTIN) 300 MG capsule; Take 2 capsules (600 mg total) by mouth 3 (three) times daily. Patient reports that he continues to have tingling in his feet and hands, increased dose and will monitor kidney function closely. If creatinine elevates I will decrease his dose.  Foot ulcer, left, limited to breakdown of skin Continue to keep area clean and follow up with Wound care asap. Explained to patient that he is at risk for losing his foot.   Dizziness Orders: -     Orthostatic vital signs Patient has some orthostatic hypotension. I have advised him to stay hydrated, change positions slowly, and get control of blood sugar.   Will check bmet in 6 weeks. I want patient to follow back up with me for diabetes as well.        Holland Commons, NP-C [facility] and Wellness [phone] 05/06/2015, 3:36 PM

## 2015-05-06 NOTE — Telephone Encounter (Signed)
Call placed once again to patient to follow-up and remind him of appointment on 05/06/15 at 1500 with Holland CommonsValerie Keck, NP to establish care. Unable to reach patient; left voicemail requesting return call.

## 2015-05-06 NOTE — Telephone Encounter (Signed)
Return call missed from patient; additional call placed to patient. Unable to reach; left voicemail requesting return call.

## 2015-05-06 NOTE — Progress Notes (Signed)
New patient here to establish to care. Pt has Diabetes and CHF.

## 2015-05-06 NOTE — Progress Notes (Signed)
This Case Manager met with patient during appointment with Chari Manning, NP for continued follow-up.  Patient provided list of Medicare Advantage plans in [facility] as well as information on [place] [employer] as letter was recently sent to patient's out of state Medicare Advantage plan,[employer], to terminate policy. Eden Lathe, [facility] Case Manager, spoke with [employer] 6/16 and per Opal Sidles [employer] indicated patient would be switched to traditional Medicare A and B once policy terminated. Patient given above information because he expressed interest in obtaining a local Medicare Advantage plan.  Also discussed if patient checking blood glucose as recommended because his blood glucose was 345 today at appointment. Patient indicates he has glucometer and strips but lancets needed. Chari Manning, NP updated.  Also discussed patient's transportation needs. Clinic has been providing cab transportation to appointments but Environmental education officer indicates patient needs a transportation plan for future appointments. This was discussed with patient, and he indicates he can afford a cab to his appointments he just needs to know when his appointments will be and addresses. Patient's AVS has upcoming appointments listed as well as facility addresses. No additional needs identified.

## 2015-05-06 NOTE — Telephone Encounter (Signed)
Return call received from patient reminding him of appointment today at 1500. Patient indicates he is aware of appointment and needing a cab. Cab arranged to pick patient up for appointment.

## 2015-05-06 NOTE — Patient Instructions (Addendum)
I have increased the dose of your gabapentin to 600 mg three times per day.   I have added a second diabetes medication on called Januvia. You will take this medication once daily. Please bring your sugar log back with you in 6 weeks.

## 2015-05-07 LAB — MICROALBUMIN, URINE: MICROALB UR: 3.7 mg/dL — AB (ref <2.0)

## 2015-05-09 ENCOUNTER — Observation Stay (HOSPITAL_COMMUNITY)
Admission: EM | Admit: 2015-05-09 | Discharge: 2015-05-12 | Disposition: A | Discharge status: Home / Self Care | Payer: Medicare Other | Attending: Internal Medicine | Admitting: Internal Medicine

## 2015-05-09 ENCOUNTER — Emergency Department (HOSPITAL_COMMUNITY): Payer: Medicare Other

## 2015-05-09 ENCOUNTER — Encounter (HOSPITAL_COMMUNITY): Payer: Self-pay

## 2015-05-09 DIAGNOSIS — G629 Polyneuropathy, unspecified: Secondary | ICD-10-CM | POA: Diagnosis not present

## 2015-05-09 DIAGNOSIS — I951 Orthostatic hypotension: Secondary | ICD-10-CM | POA: Diagnosis not present

## 2015-05-09 DIAGNOSIS — R42 Dizziness and giddiness: Principal | ICD-10-CM | POA: Insufficient documentation

## 2015-05-09 DIAGNOSIS — I129 Hypertensive chronic kidney disease with stage 1 through stage 4 chronic kidney disease, or unspecified chronic kidney disease: Secondary | ICD-10-CM | POA: Diagnosis not present

## 2015-05-09 DIAGNOSIS — Z7982 Long term (current) use of aspirin: Secondary | ICD-10-CM | POA: Diagnosis not present

## 2015-05-09 DIAGNOSIS — F0280 Dementia in other diseases classified elsewhere without behavioral disturbance: Secondary | ICD-10-CM | POA: Diagnosis not present

## 2015-05-09 DIAGNOSIS — N183 Chronic kidney disease, stage 3 unspecified: Secondary | ICD-10-CM | POA: Diagnosis present

## 2015-05-09 DIAGNOSIS — I69354 Hemiplegia and hemiparesis following cerebral infarction affecting left non-dominant side: Secondary | ICD-10-CM | POA: Diagnosis not present

## 2015-05-09 DIAGNOSIS — F17210 Nicotine dependence, cigarettes, uncomplicated: Secondary | ICD-10-CM | POA: Insufficient documentation

## 2015-05-09 DIAGNOSIS — J449 Chronic obstructive pulmonary disease, unspecified: Secondary | ICD-10-CM | POA: Diagnosis present

## 2015-05-09 DIAGNOSIS — I5022 Chronic systolic (congestive) heart failure: Secondary | ICD-10-CM | POA: Diagnosis not present

## 2015-05-09 DIAGNOSIS — L97529 Non-pressure chronic ulcer of other part of left foot with unspecified severity: Secondary | ICD-10-CM | POA: Insufficient documentation

## 2015-05-09 DIAGNOSIS — E1165 Type 2 diabetes mellitus with hyperglycemia: Secondary | ICD-10-CM

## 2015-05-09 DIAGNOSIS — IMO0002 Reserved for concepts with insufficient information to code with codable children: Secondary | ICD-10-CM | POA: Diagnosis present

## 2015-05-09 DIAGNOSIS — K219 Gastro-esophageal reflux disease without esophagitis: Secondary | ICD-10-CM | POA: Diagnosis not present

## 2015-05-09 DIAGNOSIS — Z9114 Patient's other noncompliance with medication regimen: Secondary | ICD-10-CM | POA: Diagnosis not present

## 2015-05-09 DIAGNOSIS — H8110 Benign paroxysmal vertigo, unspecified ear: Secondary | ICD-10-CM

## 2015-05-09 DIAGNOSIS — Z79899 Other long term (current) drug therapy: Secondary | ICD-10-CM | POA: Diagnosis not present

## 2015-05-09 DIAGNOSIS — E1129 Type 2 diabetes mellitus with other diabetic kidney complication: Secondary | ICD-10-CM | POA: Diagnosis present

## 2015-05-09 DIAGNOSIS — N289 Disorder of kidney and ureter, unspecified: Secondary | ICD-10-CM | POA: Diagnosis not present

## 2015-05-09 DIAGNOSIS — Z8673 Personal history of transient ischemic attack (TIA), and cerebral infarction without residual deficits: Secondary | ICD-10-CM

## 2015-05-09 LAB — CBC
HCT: 39 % (ref 39.0–52.0)
Hemoglobin: 12.6 g/dL — ABNORMAL LOW (ref 13.0–17.0)
MCH: 29.3 pg (ref 26.0–34.0)
MCHC: 32.3 g/dL (ref 30.0–36.0)
MCV: 90.7 fL (ref 78.0–100.0)
Platelets: 133 10*3/uL — ABNORMAL LOW (ref 150–400)
RBC: 4.3 MIL/uL (ref 4.22–5.81)
RDW: 16.1 % — AB (ref 11.5–15.5)
WBC: 4.5 10*3/uL (ref 4.0–10.5)

## 2015-05-09 LAB — I-STAT TROPONIN, ED
TROPONIN I, POC: 0 ng/mL (ref 0.00–0.08)
Troponin i, poc: 0.01 ng/mL (ref 0.00–0.08)

## 2015-05-09 LAB — BASIC METABOLIC PANEL
Anion gap: 9 (ref 5–15)
BUN: 15 mg/dL (ref 6–20)
CHLORIDE: 95 mmol/L — AB (ref 101–111)
CO2: 29 mmol/L (ref 22–32)
Calcium: 8.6 mg/dL — ABNORMAL LOW (ref 8.9–10.3)
Creatinine, Ser: 1.29 mg/dL — ABNORMAL HIGH (ref 0.61–1.24)
GFR calc non Af Amer: 54 mL/min — ABNORMAL LOW (ref >60)
GLUCOSE: 280 mg/dL — AB (ref 65–99)
Potassium: 3.5 mmol/L (ref 3.5–5.1)
Sodium: 133 mmol/L — ABNORMAL LOW (ref 135–145)

## 2015-05-09 MED ORDER — SODIUM CHLORIDE 0.9 % IV BOLUS (SEPSIS)
1000.0000 mL | Freq: Once | INTRAVENOUS | Status: AC
  Administered 2015-05-09: 1000 mL via INTRAVENOUS

## 2015-05-09 MED ORDER — MECLIZINE HCL 25 MG PO TABS
25.0000 mg | ORAL_TABLET | Freq: Once | ORAL | Status: AC
  Administered 2015-05-09: 25 mg via ORAL
  Filled 2015-05-09: qty 1

## 2015-05-09 NOTE — ED Notes (Signed)
Per EMS, Patient started to feel dizzy yesterday. Patient [place] cab [place] see the festival and started to get dizziness. Denies Syncope or LOC. Denies pain, but patient reports "numbness" and swelling in legs bilaterally. Patient has HX of CHF. Vitals per EMS: 118/72, 80 HR, 18 RR.

## 2015-05-09 NOTE — ED Notes (Signed)
MD made aware patient refused fluids and IV.

## 2015-05-09 NOTE — ED Notes (Addendum)
MD at the bedside. Made aware of patient's pain.

## 2015-05-09 NOTE — ED Provider Notes (Signed)
CSN: [id number]     Arrival date & time 05/09/15  1856 History   First MD Initiated Contact with Patient 05/09/15 1904     Chief Complaint  Patient presents with  . Dizziness     (Consider location/radiation/quality/duration/timing/severity/associated sxs/prior Treatment) Patient is a 74 y.o. male presenting with dizziness.  Dizziness Quality:  Head spinning and room spinning Severity:  Moderate Onset quality:  Gradual Duration:  2 days Timing:  Constant Progression:  Waxing and waning Chronicity:  New Context: head movement and standing up   Context: not with loss of consciousness   Relieved by:  Nothing Worsened by:  Standing up Ineffective treatments:  None tried   Past Medical History  Diagnosis Date  . Hypertension   . Chronic systolic CHF (congestive heart failure)     a. 2D ECHO 08/13/14: EF 40%, diffuse hypokinesis possibly worse in the inferior wall. Mild MR, mild LA dilation, PA pressure 50. Trivial pericardial effusion.  . Acute systolic CHF (congestive heart failure), NYHA class 4 08/12/2014  . Type II diabetes mellitus   . GERD (gastroesophageal reflux disease)   . Peripheral neuropathy     [name] 08/12/2014  . Stroke 2010    "memory problems since; right leg and left arm weakness since" (02/08/2015)  . Ulcers of both lower extremities     [name] 02/24/2015  . Noncompliance     [name] 02/24/2015  . COPD (chronic obstructive pulmonary disease)    Past Surgical History  Procedure Laterality Date  . Carotid endarterectomy Right 2010  . Hand ligament reconstruction Right ?1970's    "pinky"   Family History  Problem Relation Age of Onset  . Kidney disease Mother    History  Substance Use Topics  . Smoking status: Current Every Day Smoker -- 0.50 packs/day for 56 years    Types: Cigarettes  . Smokeless tobacco: Never Used  . Alcohol Use: No     Comment: 02/08/2015 "I might have a beer q 6 months"    Review of Systems  Neurological: Positive for dizziness.       Allergies  Review of patient's allergies indicates no known allergies.  Home Medications   Prior to Admission medications   Medication Sig Start Date End Date Taking? Authorizing Provider  albuterol (PROVENTIL HFA;VENTOLIN HFA) 108 (90 BASE) MCG/ACT inhaler Inhale 2 puffs into the lungs every 6 (six) hours as needed for wheezing or shortness of breath. 02/10/15  Yes Shanker Levora Dredge, MD  aspirin 81 MG chewable tablet Chew 1 tablet (81 mg total) by mouth daily. 02/10/15  Yes Shanker Levora Dredge, MD  atorvastatin (LIPITOR) 80 MG tablet Take 1 tablet (80 mg total) by mouth daily at 6 PM. 02/28/15  Yes Renae Fickle, MD  carvedilol (COREG) 3.125 MG tablet Take 1 tablet (3.125 mg total) by mouth 2 (two) times daily with a meal. 04/19/15  Yes Jaclyn Shaggy, MD  clotrimazole (LOTRIMIN) 1 % cream Apply 1 application topically 2 (two) times daily. 04/26/15  Yes Jaclyn Shaggy, MD  collagenase (SANTYL) ointment Apply topically daily. 02/10/15  Yes Shanker Levora Dredge, MD  ferrous sulfate 325 (65 FE) MG tablet Take 1 tablet (325 mg total) by mouth 2 (two) times daily with a meal. 02/28/15  Yes Renae Fickle, MD  furosemide (LASIX) 40 MG tablet Take 2 tablets (80 mg total) by mouth daily. x1 week then  daily. 04/05/15  Yes Jaclyn Shaggy, MD  gabapentin (NEURONTIN) 300 MG capsule Take 2 capsules (600 mg total) by mouth 3 (  three) times daily. 05/06/15  Yes Ambrose FinlandValerie A Keck, NP  glimepiride (AMARYL) 4 MG tablet Take 1 tablet (4 mg total) by mouth daily with breakfast. 04/12/15  Yes Jaclyn ShaggyEnobong Amao, MD  hydrALAZINE (APRESOLINE) 10 MG tablet Take 1 tablet (10 mg total) by mouth 3 (three) times daily. 02/28/15  Yes Renae FickleMackenzie Short, MD  isosorbide mononitrate (IMDUR) 30 MG 24 hr tablet Take 1 tablet (30 mg total) by mouth daily. 04/19/15  Yes Jaclyn ShaggyEnobong Amao, MD  lisinopril (PRINIVIL,ZESTRIL) 2.5 MG tablet Take 1 tablet (2.5 mg total) by mouth daily. 04/19/15  Yes Jaclyn ShaggyEnobong Amao, MD  pantoprazole (PROTONIX) 40 MG tablet Take 1  tablet (40 mg total) by mouth daily. 02/10/15  Yes Shanker Levora DredgeM Ghimire, MD  potassium chloride SA (K-DUR,KLOR-CON) 20 MEQ tablet Take 1 tablet (20 mEq total) by mouth daily. 03/01/15  Yes Renae FickleMackenzie Short, MD  sitaGLIPtin (JANUVIA) 50 MG tablet Take 1 tablet (50 mg total) by mouth daily. For diabetes 05/06/15  Yes Ambrose FinlandValerie A Keck, NP  mometasone-formoterol (DULERA) 100-5 MCG/ACT AERO Inhale 2 puffs into the lungs 2 (two) times daily. Patient not taking: Reported on 05/10/2015 02/28/15   Renae FickleMackenzie Short, MD  nicotine (NICODERM CQ - DOSED IN MG/24 HOURS) 21 mg/24hr patch Place 1 patch (21 mg total) onto the skin daily. Patient not taking: Reported on 05/10/2015 02/10/15   Maretta BeesShanker M Ghimire, MD  tiotropium (SPIRIVA HANDIHALER) 18 MCG inhalation capsule Place 1 capsule (18 mcg total) into inhaler and inhale daily. Patient not taking: Reported on 05/10/2015 03/01/15   Renae FickleMackenzie Short, MD  traMADol (ULTRAM) 50 MG tablet Take 1 tablet (50 mg total) by mouth every 6 (six) hours as needed for moderate pain (cough). Patient not taking: Reported on 05/10/2015 02/10/15   Maretta BeesShanker M Ghimire, MD   BP 133/74 mmHg  Pulse 92  Temp(Src) 98.2 F (36.8 C) (Oral)  Resp 20  SpO2 100% Physical Exam  Constitutional: He is oriented to person, place, and time. He appears well-developed and well-nourished.  HENT:  Head: Normocephalic and atraumatic.  Eyes: Conjunctivae and EOM are normal.  Neck: Normal range of motion. Neck supple.  Cardiovascular: Normal rate, regular rhythm and normal heart sounds.   Pulmonary/Chest: Effort normal and breath sounds normal. No respiratory distress.  Abdominal: He exhibits no distension. There is no tenderness. There is no rebound and no guarding.  Musculoskeletal: Normal range of motion.  Neurological: He is alert and oriented to person, place, and time. He has normal strength and normal reflexes. No cranial nerve deficit or sensory deficit. Coordination normal.  Skin: Skin is warm and dry.  Vitals  reviewed.   ED Course  Procedures (including critical care time) Labs Review Labs Reviewed  CBC - Abnormal; Notable for the following:    Hemoglobin 12.6 (*)    RDW 16.1 (*)    Platelets 133 (*)    All other components within normal limits  BASIC METABOLIC PANEL - Abnormal; Notable for the following:    Sodium 133 (*)    Chloride 95 (*)    Glucose, Bld 280 (*)    Creatinine, Ser 1.29 (*)    Calcium 8.6 (*)    GFR calc non Af Amer 54 (*)    All other components within normal limits  BASIC METABOLIC PANEL - Abnormal; Notable for the following:    Chloride 99 (*)    Glucose, Bld 218 (*)    Calcium 8.1 (*)    All other components within normal limits  CBC - Abnormal; Notable  for the following:    RBC 4.10 (*)    Hemoglobin 12.2 (*)    HCT 37.7 (*)    RDW 16.1 (*)    Platelets 123 (*)    All other components within normal limits  GLUCOSE, CAPILLARY - Abnormal; Notable for the following:    Glucose-Capillary 167 (*)    All other components within normal limits  I-STAT TROPOININ, ED  I-STAT TROPOININ, ED    Imaging Review Ct Head Wo Contrast  05/09/2015   CLINICAL DATA:  Dizziness beginning yesterday. Numbness of the legs.  EXAM: CT HEAD WITHOUT CONTRAST  TECHNIQUE: Contiguous axial images were obtained from the base of the skull through the vertex without intravenous contrast.  COMPARISON:  None.  FINDINGS: No evidence of acute infarction. The brainstem and cerebellum are normal. In the cerebral hemispheres, there is an old lacunar infarction in the left internal capsule and mild small vessel change of the deep white matter. No cortical or large vessel territory abnormality. No mass lesion, hemorrhage, hydrocephalus or extra-axial collection. No calvarial abnormality. Mild maxillary mucosal thickening.  IMPRESSION: No acute finding. 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 Ordinary mild chronic small-vessel change of the hemispheric white matter.   Electronically Signed   By:  Paulina Fusi M.D.   On: 05/09/2015 21:17     EKG Interpretation   Date/Time:  Monday May 09 2015 19:00:05 EDT Ventricular Rate:  83 PR Interval:  163 QRS Duration: 119 QT Interval:  406 QTC Calculation: 477 R Axis:   -34 Text Interpretation:  Sinus rhythm Probable left atrial enlargement LVH  with secondary repolarization abnormality Anterior Q waves, possibly due  to LVH No significant change since last tracing Confirmed by Mirian Mo [id number]) on 05/09/2015 7:28:13 PM      MDM   Final diagnoses:  Dizziness    74 y.o. male with pertinent PMH of prior CVA with ? Deficits, COPD, chf (EF 40%)  presents with both vertigo and near syncope on standing in context of being outside at a festival.  Symptoms primarily orthostatic, vitals and exam on arrival as above. Patient grossly orthostatic. Given 2 L normal saline, workup as above. Still orthostatic after 2 L. Consulted medicine for admission.    I have reviewed all laboratory and imaging studies if ordered as above  1. Dizziness         Mirian Mo, MD 05/10/15 [phone]

## 2015-05-09 NOTE — ED Notes (Signed)
Patient refused for RN to put in IV.

## 2015-05-10 ENCOUNTER — Observation Stay (HOSPITAL_COMMUNITY): Payer: Medicare Other

## 2015-05-10 DIAGNOSIS — J438 Other emphysema: Secondary | ICD-10-CM | POA: Diagnosis not present

## 2015-05-10 DIAGNOSIS — F0280 Dementia in other diseases classified elsewhere without behavioral disturbance: Secondary | ICD-10-CM

## 2015-05-10 DIAGNOSIS — N183 Chronic kidney disease, stage 3 (moderate): Secondary | ICD-10-CM | POA: Diagnosis not present

## 2015-05-10 DIAGNOSIS — I951 Orthostatic hypotension: Secondary | ICD-10-CM | POA: Diagnosis not present

## 2015-05-10 DIAGNOSIS — I5022 Chronic systolic (congestive) heart failure: Secondary | ICD-10-CM | POA: Diagnosis not present

## 2015-05-10 DIAGNOSIS — G3183 Dementia with Lewy bodies: Secondary | ICD-10-CM

## 2015-05-10 LAB — CBC
HEMATOCRIT: 37.7 % — AB (ref 39.0–52.0)
HEMOGLOBIN: 12.2 g/dL — AB (ref 13.0–17.0)
MCH: 29.8 pg (ref 26.0–34.0)
MCHC: 32.4 g/dL (ref 30.0–36.0)
MCV: 92 fL (ref 78.0–100.0)
Platelets: 123 10*3/uL — ABNORMAL LOW (ref 150–400)
RBC: 4.1 MIL/uL — AB (ref 4.22–5.81)
RDW: 16.1 % — ABNORMAL HIGH (ref 11.5–15.5)
WBC: 4.7 10*3/uL (ref 4.0–10.5)

## 2015-05-10 LAB — CLOSTRIDIUM DIFFICILE BY PCR: Toxigenic C. Difficile by PCR: NEGATIVE

## 2015-05-10 LAB — GLUCOSE, CAPILLARY
GLUCOSE-CAPILLARY: 171 mg/dL — AB (ref 65–99)
Glucose-Capillary: 167 mg/dL — ABNORMAL HIGH (ref 65–99)
Glucose-Capillary: 77 mg/dL (ref 65–99)

## 2015-05-10 LAB — BASIC METABOLIC PANEL
ANION GAP: 9 (ref 5–15)
BUN: 13 mg/dL (ref 6–20)
CALCIUM: 8.1 mg/dL — AB (ref 8.9–10.3)
CO2: 29 mmol/L (ref 22–32)
Chloride: 99 mmol/L — ABNORMAL LOW (ref 101–111)
Creatinine, Ser: 1.11 mg/dL (ref 0.61–1.24)
GFR calc Af Amer: >60 mL/min (ref >60)
GFR calc non Af Amer: >60 mL/min (ref >60)
Glucose, Bld: 218 mg/dL — ABNORMAL HIGH (ref 65–99)
Potassium: 3.9 mmol/L (ref 3.5–5.1)
Sodium: 137 mmol/L (ref 135–145)

## 2015-05-10 MED ORDER — FERROUS SULFATE 325 (65 FE) MG PO TABS
325.0000 mg | ORAL_TABLET | Freq: Two times a day (BID) | ORAL | Status: DC
  Administered 2015-05-10 – 2015-05-11 (×4): 325 mg via ORAL
  Filled 2015-05-10 (×5): qty 1

## 2015-05-10 MED ORDER — ALBUTEROL SULFATE (2.5 MG/3ML) 0.083% IN NEBU: 2.5000 mg | INHALATION_SOLUTION | Freq: Four times a day (QID) | RESPIRATORY_TRACT | Status: DC | PRN

## 2015-05-10 MED ORDER — PANTOPRAZOLE SODIUM 40 MG PO TBEC
40.0000 mg | DELAYED_RELEASE_TABLET | Freq: Every day | ORAL | Status: DC
  Administered 2015-05-11: 40 mg via ORAL
  Filled 2015-05-10 (×3): qty 1

## 2015-05-10 MED ORDER — SODIUM CHLORIDE 0.9 % IV BOLUS (SEPSIS)
500.0000 mL | Freq: Once | INTRAVENOUS | Status: AC
  Administered 2015-05-10: 500 mL via INTRAVENOUS

## 2015-05-10 MED ORDER — GABAPENTIN 300 MG PO CAPS: 600.0000 mg | ORAL_CAPSULE | Freq: Three times a day (TID) | ORAL | Status: DC

## 2015-05-10 MED ORDER — MECLIZINE HCL 12.5 MG PO TABS
25.0000 mg | ORAL_TABLET | Freq: Three times a day (TID) | ORAL | Status: DC
  Administered 2015-05-10 – 2015-05-11 (×6): 25 mg via ORAL
  Filled 2015-05-10 (×7): qty 2

## 2015-05-10 MED ORDER — INSULIN ASPART 100 UNIT/ML ~~LOC~~ SOLN
0.0000 [IU] | Freq: Three times a day (TID) | SUBCUTANEOUS | Status: DC
  Administered 2015-05-10: 3 [IU] via SUBCUTANEOUS
  Administered 2015-05-11: 2 [IU] via SUBCUTANEOUS

## 2015-05-10 MED ORDER — ASPIRIN 81 MG PO CHEW
81.0000 mg | CHEWABLE_TABLET | Freq: Every day | ORAL | Status: DC
  Administered 2015-05-10 – 2015-05-11 (×2): 81 mg via ORAL
  Filled 2015-05-10 (×3): qty 1

## 2015-05-10 MED ORDER — ONDANSETRON HCL 4 MG PO TABS: 4.0000 mg | ORAL_TABLET | Freq: Four times a day (QID) | ORAL | Status: DC | PRN

## 2015-05-10 MED ORDER — HYDRALAZINE HCL 10 MG PO TABS
10.0000 mg | ORAL_TABLET | Freq: Three times a day (TID) | ORAL | Status: DC
  Administered 2015-05-10 – 2015-05-11 (×5): 10 mg via ORAL
  Filled 2015-05-10 (×7): qty 1

## 2015-05-10 MED ORDER — ONDANSETRON HCL 4 MG/2ML IJ SOLN: 4.0000 mg | Freq: Four times a day (QID) | INTRAMUSCULAR | Status: DC | PRN

## 2015-05-10 MED ORDER — SODIUM CHLORIDE 0.9 % IV SOLN
INTRAVENOUS | Status: DC
  Administered 2015-05-10: 01:00:00 via INTRAVENOUS

## 2015-05-10 MED ORDER — ATORVASTATIN CALCIUM 80 MG PO TABS
80.0000 mg | ORAL_TABLET | Freq: Every day | ORAL | Status: DC
  Administered 2015-05-10 – 2015-05-11 (×2): 80 mg via ORAL
  Filled 2015-05-10 (×2): qty 1

## 2015-05-10 MED ORDER — GABAPENTIN 600 MG PO TABS
600.0000 mg | ORAL_TABLET | Freq: Three times a day (TID) | ORAL | Status: DC
  Administered 2015-05-10 – 2015-05-11 (×4): 600 mg via ORAL
  Filled 2015-05-10 (×3): qty 1

## 2015-05-10 MED ORDER — POTASSIUM CHLORIDE CRYS ER 20 MEQ PO TBCR
20.0000 meq | EXTENDED_RELEASE_TABLET | Freq: Every day | ORAL | Status: DC
  Filled 2015-05-10 (×3): qty 1

## 2015-05-10 MED ORDER — GLIMEPIRIDE 4 MG PO TABS
4.0000 mg | ORAL_TABLET | Freq: Every day | ORAL | Status: DC
  Administered 2015-05-10 – 2015-05-11 (×2): 4 mg via ORAL
  Filled 2015-05-10 (×3): qty 1

## 2015-05-10 NOTE — Progress Notes (Signed)
Patient admitted earlier this morning. H&P reviewed. Patient was seen and examined  S: Patient is a poor historian. He tells me that he continues to have symptoms of dizziness even while lying down. Gets worse when he stands up. Mostly swimmy headed. Though sometimes he feels as though the room is spinning around.  O: Vital signs reviewed. Lungs are clear to auscultation bilaterally S1, S2 is normal. Regular. No murmurs. No S3s were no rubs, or bruit. Abdomen is soft, nontender, nondistended. Bowel sounds are present. No masses, organomegaly No nystagmus. Cranial nerves II-12 intact. Motor strength equal bilateral upper and lower extremities  A/P: Patient continues to have symptoms of dizziness. He was noted to be orthostatic. There is some element of vertigo as well. No focal neurological deficits. MRI brain was offered to the patient, but he adamantly refuses. I offered to sedate him, but he still refuses. He was told about the possibility of a stroke. He continues to decline. Agreeable to a repeat CT scan this afternoon. Meclizine on a scheduled basis. Would recommend continuing IV fluids for a few more hours, but the patient declines. He is noted to be on multiple blood pressure lowering agents which could be contributing and which have been held for now. He is noted to be on ACE inhibitor along with hydralazine and nitrate. The hydralazine/nitrate could be discontinued.  Other medical issues are all stable. Initiate sliding scale coverage. Monitor renal function closely.  PT and OT to evaluate.  Continue to monitor.  Osvaldo ShipperKRISHNAN,Herbie Lehrmann  [phone]  05/10/2015 12:29 PM

## 2015-05-10 NOTE — Progress Notes (Signed)
Patient just refused to have his morning VS taken. Patient stated he was trying to go to sleep.

## 2015-05-10 NOTE — Progress Notes (Signed)
IV fluid discontinued per patient request. Physician notified.

## 2015-05-10 NOTE — Progress Notes (Signed)
Pt reports feeling "fuzzy headed." Pt is alert and oriented x4, and is able to tell writer about his admission. Pt reports feeling more drowsy than normal but states that he did not get any sleep last night. MD clarified that pt is not being treated as a stroke pt currently. Will continue to monitor.

## 2015-05-10 NOTE — ED Notes (Signed)
Pharm tech reports that pt is uncooperative reviewing meds.

## 2015-05-10 NOTE — Hospital Discharge Follow-Up (Signed)
Met with member as he has been followed at the [facility].  Will continue to monitor his hospital course.

## 2015-05-10 NOTE — Evaluation (Signed)
Physical Therapy Evaluation Patient Details Name: Russell Hamilton MRN: [id number] DOB: 07/18/1941 Today's Date: 05/10/2015   History of Present Illness  [age] yo male h/o chf ef 40%, ckd, htn, stoke, copd comes in with one week of dizziness   Clinical Impression  Patient demonstrates deficits in functional mobility as indicated below. Will benefit from continued skilled PT to address deficits and maximize function. OF NOTE:  Patient reporting dizziness in the form of "SPINNING COUNTER CLOCKWISE", worsened with positional changes. May benefit from Vestibular evaluation.      Follow Up Recommendations  No PT follow up (pending vestibular assessment)    Equipment Recommendations  None recommended by PT    Recommendations for Other Services Other (comment) (vestibular evaluation)     Precautions / Restrictions Precautions Precautions: Fall Restrictions Weight Bearing Restrictions: No      Mobility  Bed Mobility Overal bed mobility: Modified Independent                Transfers Overall transfer level: Modified independent               General transfer comment: does report dizziness upon standing, no physical assist required  Ambulation/Gait Ambulation/Gait assistance: Min guard Ambulation Distance (Feet): 16 Feet Assistive device: Straight cane       General Gait Details: limited by pain and dizziness  Stairs            Wheelchair Mobility    Modified Rankin (Stroke Patients Only)       Balance Overall balance assessment: No apparent balance deficits (not formally assessed)                                           Pertinent Vitals/Pain Pain Assessment: Faces Faces Pain Scale: Hurts little more Pain Location: bilateral feet Pain Descriptors / Indicators: Grimacing;Sharp Pain Intervention(s): Limited activity within patient's tolerance;Monitored during session;Repositioned    Home Living Family/patient expects to be discharged  to:: Private residence Living Arrangements: Alone Available Help at Discharge: Family;Available PRN/intermittently Type of Home: House Home Access: Stairs to enter Entrance Stairs-Rails: Left Entrance Stairs-Number of Steps: 8 Home Layout: Two level;Able to live on main level with bedroom/bathroom Home Equipment: Gilmer Morane - single point      Prior Function Level of Independence: Independent with assistive device(s)         Comments: Uses SPC      Hand Dominance   Dominant Hand: Right    Extremity/Trunk Assessment               Lower Extremity Assessment: Generalized weakness (nerve pain, hx of peripheral neuropathy)         Communication   Communication: No difficulties  Cognition Arousal/Alertness: Awake/alert Behavior During Therapy: Agitated Overall Cognitive Status: Within Functional Limits for tasks assessed                      General Comments General comments (skin integrity, edema, etc.): orthostatics assessed during activity 145/82 supine, 135/80 sitting, 128/71     Exercises        Assessment/Plan    PT Assessment Patient needs continued PT services  PT Diagnosis Difficulty walking;Acute pain   PT Problem List Decreased strength;Decreased activity tolerance;Pain  PT Treatment Interventions DME instruction;Gait training;Stair training;Functional mobility training;Therapeutic activities;Therapeutic exercise;Balance training;Patient/family education   PT Goals (Current goals can be found in the Care Plan section)  Acute Rehab PT Goals Patient Stated Goal: to get some rest PT Goal Formulation: With patient Time For Goal Achievement: 05/17/15 Potential to Achieve Goals: Good    Frequency Min 3X/week   Barriers to discharge        Co-evaluation               End of Session   Activity Tolerance: Patient limited by pain;Patient limited by fatigue Patient left: in bed;with call bell/phone within reach;with bed alarm set Nurse  Communication: Mobility status         Time: 2956-2130 PT Time Calculation (min) (ACUTE ONLY): 18 min   Charges:   PT Evaluation $Initial PT Evaluation Tier I: 1 Procedure     PT G CodesFabio Asa 05/27/15, 11:20 AM Charlotte Crumb, PT DPT  [phone]

## 2015-05-10 NOTE — Evaluation (Signed)
Occupational Therapy Evaluation Patient Details Name: Russell MathJames Hamilton MRN: [id number] DOB: 12/15/1940 Today's Date: 05/10/2015    History of Present Illness 74 yo male h/o chf ef 40%, ckd, htn, stoke, copd comes in with one week of dizziness    Clinical Impression   Pt admitted with above. He demonstrates the below listed deficits and will benefit from continued OT to maximize safety and independence with BADLs.  Pt presents to OT with limited participation.  Pt noted to provide multiple inconsistencies throughout the eval. - see below for specifics.   Based on the inconsistencies, and that pt sleeping and not initiating nor engaging in activity makes it concerning for him to return home alone.  Feel pt likely needs 24 hour supervision, and may need a higher level of care such as ALF or SNF.      Follow Up Recommendations  SNF;Supervision/Assistance - 24 hour    Equipment Recommendations  None recommended by OT    Recommendations for Other Services       Precautions / Restrictions Precautions Precautions: Fall Restrictions Weight Bearing Restrictions: No      Mobility Bed Mobility Overal bed mobility: Modified Independent             General bed mobility comments: Pt unable to move to EOB due to complaint of dizziness (room spinning)  Transfers Overall transfer level: Modified independent               General transfer comment: does report dizziness upon standing, no physical assist required    Balance Overall balance assessment: No apparent balance deficits (not formally assessed)                                          ADL                                         General ADL Comments: Pt refused OOB and refused to perform     Vision Vision Assessment?: Yes Ocular Range of Motion: Within Functional Limits Alignment/Gaze Preference: Within Defined Limits Tracking/Visual Pursuits: Able to track stimulus in all quads  without difficulty Saccades: Within functional limits Convergence: Impaired - to be further tested in functional context Visual Fields: No apparent deficits Additional Comments: Pt reports he wears glasses only for reading.  He was unable to converge due to blurriness of vision at all distances. He also reports he can't read the clock due to blurriness.  When asked if blurry vision is new, he initially stated yes, then he states it has been long standing.     Perception     Praxis      Pertinent Vitals/Pain Pain Assessment: Faces Faces Pain Scale: Hurts little more Pain Location: bilateral LEs Pain Descriptors / Indicators: Grimacing Pain Intervention(s): Monitored during session;Limited activity within patient's tolerance     Hand Dominance Right   Extremity/Trunk Assessment Upper Extremity Assessment Upper Extremity Assessment: Overall WFL for tasks assessed   Lower Extremity Assessment Lower Extremity Assessment: Defer to PT evaluation       Communication Communication Communication: No difficulties   Cognition Arousal/Alertness: Awake/alert Behavior During Therapy: Agitated Overall Cognitive Status: Impaired/Different from baseline Area of Impairment: Attention;Memory;Following commands;Safety/judgement;Awareness;Problem solving   Current Attention Level: Sustained Memory: Decreased short-term memory Following Commands: Follows one step commands  consistently Safety/Judgement: Decreased awareness of safety Awareness: Intellectual Problem Solving: Difficulty sequencing;Requires verbal cues;Requires tactile cues General Comments: Pt noted to provide inconsistencies with responses.   Pt states that someone had just come in and shook his head and tested his vision which caused him to be dizzy.  Upon speaking with PT, they had seen him this morning, and did not do a vestibular eval.   Attempted to instruct pt in gaze stabilization strategies, and he reports that someone  instructed him on those earlier, but when describing the objects he uses to stabilize gaze are not the most appropriate objects.   He was somewhat argumentative, and insisted that someone told him this earlier.   He then describes trying to stare at the streetlights and it didn't help (at the [facility]).  When asked he has had a h/o dizziness or vertigo he states no and states he has never received therapy for vestibular disorder.  Upon chart review, pt has h/o dizziness, and has received OPPT in the past for vestibular rehab. Pt also states he has poor memory due to previous stroke    General Comments       Exercises       Shoulder Instructions      Home Living Family/patient expects to be discharged to:: Private residence Living Arrangements: Alone Available Help at Discharge: Family;Available PRN/intermittently Type of Home: House Home Access: Stairs to enter [employer] of Steps: 8 Entrance Stairs-Rails: Left Home Layout: Two level;Able to live on main level with bedroom/bathroom     Bathroom Shower/Tub: Tub/shower unit;Curtain Shower/tub characteristics: Engineer, building services: Standard     Home Equipment: Cane - single point   Additional Comments: Pt reports he moved to [place] ~ 1 year ago.        Prior Functioning/Environment Level of Independence: Independent with assistive device(s)        Comments: Pt denies use of AD.  He reports he drives, but that his niece assists him with grocery shopping     OT Diagnosis: Generalized weakness;Cognitive deficits;Disturbance of vision   OT Problem List: Decreased strength;Decreased activity tolerance;Impaired balance (sitting and/or standing);Impaired vision/perception;Decreased cognition;Decreased safety awareness;Decreased knowledge of use of DME or AE;Pain   OT Treatment/Interventions: Self-care/ADL training;DME and/or AE instruction;Therapeutic activities;Cognitive remediation/compensation;Visual/perceptual  remediation/compensation;Patient/family education;Balance training    OT Goals(Current goals can be found in the care plan section) Acute Rehab OT Goals Patient Stated Goal: to feel better  OT Goal Formulation: With patient Time For Goal Achievement: 05/24/15 Potential to Achieve Goals: Fair ADL Goals Pt Will Perform Grooming: with supervision;standing Pt Will Perform Upper Body Bathing: with supervision;sitting Pt Will Perform Lower Body Bathing: with supervision;sit to/from stand Pt Will Perform Upper Body Dressing: with supervision;sitting Pt Will Perform Lower Body Dressing: with supervision;sit to/from stand Pt Will Transfer to Toilet: with supervision;ambulating;regular height toilet;grab bars Pt Will Perform Toileting - Clothing Manipulation and hygiene: with supervision;sit to/from stand Pt Will Perform Tub/Shower Transfer: Tub transfer;with supervision;ambulating  OT Frequency: Min 2X/week   Barriers to D/C: Decreased caregiver support          Co-evaluation              End of Session Nurse Communication: Mobility status  Activity Tolerance: Patient limited by pain;Other (comment) (dizziness ) Patient left: in bed;with call bell/phone within reach;with bed alarm set   Time: 1610-9604 OT Time Calculation (min): 25 min Charges:  OT General Charges $OT Visit: 1 Procedure OT Evaluation $Initial OT Evaluation Tier I: 1  Procedure OT Treatments $Therapeutic Activity: 8-22 mins G-Codes: OT G-codes **NOT FOR INPATIENT CLASS** Functional Limitation: Self care Self Care Current Status (Z6109): At least 60 percent but less than 80 percent impaired, limited or restricted Self Care Goal Status (U0454): At least 1 percent but less than 20 percent impaired, limited or restricted  Arayna Illescas M 05/10/2015, 2:53 PM

## 2015-05-10 NOTE — Progress Notes (Signed)
Patient arrived to 4N28 from the ED, alert and oriented. MAEW. VSS. Patient reports some dizziness. Patient oriented to room and equipment. All safety measures in place. Will monitor overnight.

## 2015-05-10 NOTE — H&P (Signed)
PCP:   Holland CommonsKECK, VALERIE, NP   Chief Complaint:  dizziness  HPI: 74 yo male h/o chf ef 40%, ckd, htn, stoke, copd comes in with one week of dizziness mainly upon standing but also sometimes happens when he is just lying there.  Denies any  N/v/d.  Denies any fevers.  No numbness or tingling anywhere or weakness anywhere.  He says he gets really weak and dizzy when he stands up.   No rashes.  Has received 2 liters of ivf in the ED and feels much better.  Is orthostatic in the ED sbp drops into the 80s upon standing.  Denies any loc.  Review of Systems:  Positive and negative as per HPI otherwise all other systems are negative  Past Medical History: Past Medical History  Diagnosis Date  . Hypertension   . Chronic systolic CHF (congestive heart failure)     a. 2D ECHO 08/13/14: EF 40%, diffuse hypokinesis possibly worse in the inferior wall. Mild MR, mild LA dilation, PA pressure 50. Trivial pericardial effusion.  . Acute systolic CHF (congestive heart failure), NYHA class 4 08/12/2014  . Type II diabetes mellitus   . GERD (gastroesophageal reflux disease)   . Peripheral neuropathy     [name]/notes 08/12/2014  . Stroke 2010    "memory problems since; right leg and left arm weakness since" (02/08/2015)  . Ulcers of both lower extremities     [name]/notes 02/24/2015  . Noncompliance     [name]/notes 02/24/2015  . COPD (chronic obstructive pulmonary disease)    Past Surgical History  Procedure Laterality Date  . Carotid endarterectomy Right 2010  . Hand ligament reconstruction Right ?1970's    "pinky"    Medications: Prior to Admission medications   Medication Sig Start Date End Date Taking? Authorizing Provider  albuterol (PROVENTIL HFA;VENTOLIN HFA) 108 (90 BASE) MCG/ACT inhaler Inhale 2 puffs into the lungs every 6 (six) hours as needed for wheezing or shortness of breath. Patient not taking: Reported on 04/26/2015 02/10/15   Maretta BeesShanker M Ghimire, MD  aspirin 81 MG chewable tablet Chew 1 tablet (81 mg total)  by mouth daily. 02/10/15   Shanker Levora DredgeM Ghimire, MD  atorvastatin (LIPITOR) 80 MG tablet Take 1 tablet (80 mg total) by mouth daily at 6 PM. 02/28/15   Renae FickleMackenzie Short, MD  Blood Glucose Monitoring Suppl (ACCU-CHEK AVIVA) device Use as instructed 04/26/15 04/25/16  Jaclyn ShaggyEnobong Amao, MD  carvedilol (COREG) 3.125 MG tablet Take 1 tablet (3.125 mg total) by mouth 2 (two) times daily with a meal. 04/19/15   Jaclyn ShaggyEnobong Amao, MD  clotrimazole (LOTRIMIN) 1 % cream Apply 1 application topically 2 (two) times daily. 04/26/15   Jaclyn ShaggyEnobong Amao, MD  collagenase (SANTYL) ointment Apply topically daily. 02/10/15   Shanker Levora DredgeM Ghimire, MD  ferrous sulfate 325 (65 FE) MG tablet Take 1 tablet (325 mg total) by mouth 2 (two) times daily with a meal. 02/28/15   Renae FickleMackenzie Short, MD  furosemide (LASIX) 40 MG tablet Take 2 tablets (80 mg total) by mouth daily. x1 week then 40mg  daily. 04/05/15   Jaclyn ShaggyEnobong Amao, MD  gabapentin (NEURONTIN) 300 MG capsule Take 2 capsules (600 mg total) by mouth 3 (three) times daily. 05/06/15   Ambrose FinlandValerie A Keck, NP  glimepiride (AMARYL) 4 MG tablet Take 1 tablet (4 mg total) by mouth daily with breakfast. 04/12/15   Jaclyn ShaggyEnobong Amao, MD  glucose blood (ACCU-CHEK AVIVA) test strip Use as instructed 04/26/15   Jaclyn ShaggyEnobong Amao, MD  hydrALAZINE (APRESOLINE) 10 MG tablet Take  1 tablet (10 mg total) by mouth 3 (three) times daily. 02/28/15   Renae Fickle, MD  isosorbide mononitrate (IMDUR) 30 MG 24 hr tablet Take 1 tablet (30 mg total) by mouth daily. 04/19/15   Jaclyn Shaggy, MD  Lancets (ACCU-CHEK MULTICLIX) lancets Use as instructed 04/26/15   Jaclyn Shaggy, MD  lisinopril (PRINIVIL,ZESTRIL) 2.5 MG tablet Take 1 tablet (2.5 mg total) by mouth daily. 04/19/15   Jaclyn Shaggy, MD  mometasone-formoterol (DULERA) 100-5 MCG/ACT AERO Inhale 2 puffs into the lungs 2 (two) times daily. 02/28/15   Renae Fickle, MD  nicotine (NICODERM CQ - DOSED IN MG/24 HOURS) 21 mg/24hr patch Place 1 patch (21 mg total) onto the skin daily. 02/10/15   Shanker Levora Dredge, MD  pantoprazole (PROTONIX) 40 MG tablet Take 1 tablet (40 mg total) by mouth daily. 02/10/15   Shanker Levora Dredge, MD  potassium chloride SA (K-DUR,KLOR-CON) 20 MEQ tablet Take 1 tablet (20 mEq total) by mouth daily. 03/01/15   Renae Fickle, MD  sitaGLIPtin (JANUVIA) 50 MG tablet Take 1 tablet (50 mg total) by mouth daily. For diabetes 05/06/15   Ambrose Finland, NP  tiotropium (SPIRIVA HANDIHALER) 18 MCG inhalation capsule Place 1 capsule (18 mcg total) into inhaler and inhale daily. 03/01/15   Renae Fickle, MD  traMADol (ULTRAM) 50 MG tablet Take 1 tablet (50 mg total) by mouth every 6 (six) hours as needed for moderate pain (cough). 02/10/15   Shanker Levora Dredge, MD    Allergies:  No Known Allergies  Social History:  reports that he has been smoking Cigarettes.  He has a 28 pack-year smoking history. He has never used smokeless tobacco. He reports that he uses illicit drugs (Marijuana). He reports that he does not drink alcohol.  Family History: Family History  Problem Relation Age of Onset  . Kidney disease Mother     Physical Exam: Filed Vitals:   05/09/15 2200 05/09/15 2215 05/09/15 2230 05/09/15 2330  BP: 114/63 116/61 110/63 131/65  Pulse: 86 91 83 89  Temp:      TempSrc:      Resp: 19 19 15 19   SpO2: 100% 100% 100% 97%   General appearance: alert, cooperative and no distress Head: Normocephalic, without obvious abnormality, atraumatic Eyes: negative Nose: Nares normal. Septum midline. Mucosa normal. No drainage or sinus tenderness. Neck: no JVD and supple, symmetrical, trachea midline Lungs: clear to auscultation bilaterally Heart: regular rate and rhythm, S1, S2 normal, no murmur, click, rub or gallop Abdomen: soft, non-tender; bowel sounds normal; no masses,  no organomegaly Extremities: extremities normal, atraumatic, no cyanosis or edema Pulses: 2+ and symmetric Skin: Skin color, texture, turgor normal. No rashes or lesions Neurologic: Grossly  normal   Labs on Admission:   Recent Labs  05/09/15 1919  NA 133*  K 3.5  CL 95*  CO2 29  GLUCOSE 280*  BUN 15  CREATININE 1.29*  CALCIUM 8.6*    Recent Labs  05/09/15 1919  WBC 4.5  HGB 12.6*  HCT 39.0  MCV 90.7  PLT 133*   Radiological Exams on Admission: Ct Head Wo Contrast  05/09/2015   CLINICAL DATA:  Dizziness beginning yesterday. Numbness of the legs.  EXAM: CT HEAD WITHOUT CONTRAST  TECHNIQUE: Contiguous axial images were obtained from the base of the skull through the vertex without intravenous contrast.  COMPARISON:  None.  FINDINGS: No evidence of acute infarction. The brainstem and cerebellum are normal. In the cerebral hemispheres, there is an old lacunar infarction  in the left internal capsule and mild small vessel change of the deep white matter. No cortical or large vessel territory abnormality. No mass lesion, hemorrhage, hydrocephalus or extra-axial collection. No calvarial abnormality. Mild maxillary mucosal thickening.  IMPRESSION: No acute finding. 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 Ordinary mild chronic small-vessel change of the hemispheric white matter.   Electronically Signed   By: Paulina Fusi M.D.   On: 05/09/2015 21:17   ekg reviewed no acute issues Old chart reviewed  Assessment/Plan  74 yo male with orthostatic hypotension/dizziness on multiple blood pressure medications  Principal Problem:   Orthostasis-  Given 2.5 liters of ivf.  Cont ivf overnight.  Repeat orthostatics in the am.  Hold coreg, lasix, lisinopril and imdur for now.  Active Problems:   History of stroke-  noted   Chronic systolic CHF (congestive heart failure)-  Stable and compensated   Type II diabetes mellitus with renal manifestations, uncontrolled- stable   Chronic kidney disease, stage 3- stable   COPD (chronic obstructive pulmonary disease)- stable   Dementia with Lewy bodies- apppears mild   Dizziness and giddiness- as above  obs on medical.  Full  code.  DAVID,RACHAL A 05/10/2015, 12:02 AM

## 2015-05-11 ENCOUNTER — Encounter (HOSPITAL_BASED_OUTPATIENT_CLINIC_OR_DEPARTMENT_OTHER): Payer: Medicare (Managed Care)

## 2015-05-11 DIAGNOSIS — I5022 Chronic systolic (congestive) heart failure: Secondary | ICD-10-CM | POA: Diagnosis not present

## 2015-05-11 DIAGNOSIS — J438 Other emphysema: Secondary | ICD-10-CM | POA: Diagnosis not present

## 2015-05-11 DIAGNOSIS — R42 Dizziness and giddiness: Principal | ICD-10-CM

## 2015-05-11 DIAGNOSIS — E1165 Type 2 diabetes mellitus with hyperglycemia: Secondary | ICD-10-CM

## 2015-05-11 DIAGNOSIS — N183 Chronic kidney disease, stage 3 (moderate): Secondary | ICD-10-CM | POA: Diagnosis not present

## 2015-05-11 DIAGNOSIS — E1129 Type 2 diabetes mellitus with other diabetic kidney complication: Secondary | ICD-10-CM

## 2015-05-11 DIAGNOSIS — I951 Orthostatic hypotension: Secondary | ICD-10-CM | POA: Diagnosis not present

## 2015-05-11 DIAGNOSIS — Z8673 Personal history of transient ischemic attack (TIA), and cerebral infarction without residual deficits: Secondary | ICD-10-CM

## 2015-05-11 LAB — GLUCOSE, CAPILLARY
GLUCOSE-CAPILLARY: 202 mg/dL — AB (ref 65–99)
Glucose-Capillary: 84 mg/dL (ref 65–99)
Glucose-Capillary: 140 mg/dL — ABNORMAL HIGH (ref 65–99)
Glucose-Capillary: 118 mg/dL — ABNORMAL HIGH (ref 65–99)

## 2015-05-11 LAB — BASIC METABOLIC PANEL
ANION GAP: 6 (ref 5–15)
BUN: 8 mg/dL (ref 6–20)
CALCIUM: 8.7 mg/dL — AB (ref 8.9–10.3)
CO2: 29 mmol/L (ref 22–32)
CREATININE: 0.98 mg/dL (ref 0.61–1.24)
Chloride: 103 mmol/L (ref 101–111)
GFR calc Af Amer: >60 mL/min (ref >60)
GFR calc non Af Amer: >60 mL/min (ref >60)
Glucose, Bld: 86 mg/dL (ref 65–99)
Potassium: 3.5 mmol/L (ref 3.5–5.1)
Sodium: 138 mmol/L (ref 135–145)

## 2015-05-11 LAB — CBC
HCT: 40.9 % (ref 39.0–52.0)
HEMOGLOBIN: 13.3 g/dL (ref 13.0–17.0)
MCH: 29.6 pg (ref 26.0–34.0)
MCHC: 32.5 g/dL (ref 30.0–36.0)
MCV: 90.9 fL (ref 78.0–100.0)
PLATELETS: 138 10*3/uL — AB (ref 150–400)
RBC: 4.5 MIL/uL (ref 4.22–5.81)
RDW: 16.3 % — ABNORMAL HIGH (ref 11.5–15.5)
WBC: 6.2 10*3/uL (ref 4.0–10.5)

## 2015-05-11 MED ORDER — OXYCODONE-ACETAMINOPHEN 5-325 MG PO TABS
2.0000 | ORAL_TABLET | Freq: Once | ORAL | Status: AC
Start: 2015-05-11 — End: 2015-05-11
  Administered 2015-05-11: 2 via ORAL
  Filled 2015-05-11: qty 2

## 2015-05-11 MED ORDER — FUROSEMIDE 40 MG PO TABS: 40.0000 mg | ORAL_TABLET | Freq: Every day | ORAL | Status: DC

## 2015-05-11 MED ORDER — COLLAGENASE 250 UNIT/GM EX OINT
TOPICAL_OINTMENT | Freq: Every day | CUTANEOUS | Status: DC
  Administered 2015-05-11: 16:00:00 via TOPICAL
  Filled 2015-05-11: qty 30

## 2015-05-11 MED ORDER — MECLIZINE HCL 25 MG PO TABS: 25.0000 mg | ORAL_TABLET | Freq: Three times a day (TID) | ORAL | Status: DC

## 2015-05-11 MED ORDER — OXYCODONE-ACETAMINOPHEN 5-325 MG PO TABS: 1.0000 | ORAL_TABLET | Freq: Four times a day (QID) | ORAL | Status: DC | PRN

## 2015-05-11 NOTE — Progress Notes (Signed)
CM spoke with attending MD to relay the conversation with Shanda B[facility]Jessica [facility].  Per Shanda BumpsJessica, patient needs wound follow-up in addition to his vestibular rehab and overall disease management.  Per Shanda BumpsJessica, patient has difficulty making it to appointments due to lack of transportation.  Because of this, patient may benefit from home health services to address his multiple comorbidities. CM will continue to follow.

## 2015-05-11 NOTE — Consult Note (Signed)
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  Patient states he changes his dressing daily and applies a cream.  Does not know the name/type of cream. States it is a prescription.  Wound type:Neuropathic, full thickness.   Measurement: 2.5 cm x 4.2 cm fluctuant, adherent eschar with purulence noted from 11 to 1 o'clock.   Wound bed: 100% eschar, adherent to wound bed, but separated from wound margins with purulence noted.  Drainage (amount, consistency, odor) Minimal purulence noted from around eschar.  Periwound:Intact Dressing procedure/placement/frequency:Cleanse ulcer to left lateral foot with NS and pat gently dry.  Apply Santyl ointment to wound bed, 1/8 inch thickness.  Cover with NS moist gauze.  Secure with 4x4 and kerlix.  Change daily. Send patient home with Santyl at discharge.  Will not follow at this time.  Please re-consult if needed.  Maple HudsonKaren Tyniah Kastens RN BSN CWON Pager [phone]

## 2015-05-11 NOTE — Hospital Discharge Follow-Up (Signed)
This Case Manager met with patient at bedside. Patient was recently followed at the [facility]. Patient agreeable to hospital discharge follow-up appointment next week. Appointment scheduled on 05/17/15 at 1030 with Dr. Jarold Song. Appointment placed on AVS.  Patient had [facility] appointment today. Patient indicates wound care nurse has been consulted for his left foot. Call placed to [facility], CM to discuss patient's needs.  Informed Loma Sousa of patient's hospital follow-up and patient's wound care needs.  She indicates she plans to discuss home health services (SN-for medication/symptom management, wound care, as well as therapy services) with Hospitalist and patient.  This Case Manager following patient's clinical progress closely.

## 2015-05-11 NOTE — Progress Notes (Signed)
Physical Therapy Treatment Patient Details Name: Russell Hamilton MRN: [id number] DOB: Dec 05, 1940 Today's Date: 05/11/2015    History of Present Illness 74 yo male h/o chf ef 40%, ckd, htn, stoke, copd comes in with one week of dizziness     PT Comments    Patient with signs of central vestibular dysfunction as well as possible mild right BPPV.  Patient with left eye oculomotor abnormalities contributing to c/o blurry vision.  Patient with increased sitting balance issues this session and increased assist for bed mobility.  Feel will need SNF level rehab as lives alone and may need increased time to return to independent level.  Follow Up Recommendations  SNF     Equipment Recommendations  None recommended by PT    Recommendations for Other Services       Precautions / Restrictions Precautions Precautions: Fall Restrictions Weight Bearing Restrictions: No    Mobility  Bed Mobility Overal bed mobility: Needs Assistance Bed Mobility: Sidelying to Sit;Sit to Sidelying   Sidelying to sit: Min assist     Sit to sidelying: Min assist General bed mobility comments: assist for positioning for modified hall pike  Transfers Overall transfer level: Needs assistance Equipment used: Straight cane Transfers: Sit to/from Stand Sit to Stand: Min assist         General transfer comment: declined mobility this session due to dizziness and already up with OT earlier today  Ambulation/Gait                 Stairs            Wheelchair Mobility    Modified Rankin (Stroke Patients Only)       Balance Overall balance assessment: Needs assistance;History of Falls Sitting-balance support: Bilateral upper extremity supported Sitting balance-Leahy Scale: Poor Sitting balance - Comments: initially needed mod support progressing to minguard to supervision for balance sitting edge of bed   Standing balance support: Single extremity supported;During functional  activity Standing balance-Leahy Scale: Poor Standing balance comment: min A and external support to complete ADLs standing at sink                     Cognition Arousal/Alertness: Awake/alert Behavior During Therapy: WFL for tasks assessed/performed Overall Cognitive Status: Impaired/Different from baseline Area of Impairment: Attention;Memory;Following commands;Safety/judgement;Problem solving;Awareness   Current Attention Level: Selective Memory: Decreased recall of precautions;Decreased short-term memory Following Commands: Follows multi-step commands consistently Safety/Judgement: Decreased awareness of deficits;Decreased awareness of safety Awareness: Intellectual Problem Solving: Requires verbal cues;Requires tactile cues;Difficulty sequencing General Comments: recall of all events of previous OT session and of beginning of dizziness symptoms, but some inconsistencies such as reports dizziness not exacerbated by movement, but then did have position evoked dizziness.    Exercises      General Comments   Vestibular Assessment   05/11/15 1544  Symptom Behavior  Type of Dizziness Spinning (and blurry vision)  Frequency of Dizziness worse with position changes  Duration of Dizziness sometimes few moments sometimes several hours  Aggravating Factors Spontaneous onset;Forward bending;Activity in general  Relieving Factors Closing eyes (shakes his head to clear vision)  Occulomotor Exam  Occulomotor Alignment Abnormal (left eye difficulty tracking medially and laterally with rig)  Spontaneous Absent  Gaze-induced Direction changing nystagmus  Head shaking Horizontal Absent (but reports 10/10 dizziness)  Head Shaking Vertical Absent (but reports increased dizziness)  Smooth Pursuits Saccades (saccadic when moving to right)  Vestibulo-Occular Reflex  VOR 1 Head Only (x 1 viewing) 5 head turns with  assist good target maintenance, increased symptoms mildly  VOR 2 Head  and Object (x 2 viewing) no issues finding target or moving head  VOR Cancellation Normal  Auditory  Comments hearing intact and equal to bilat to scratch test  Positional Testing  Sidelying Test Sidelying Right;Sidelying Left  Horizontal Canal Testing Horizontal Canal Right;Horizontal Canal Left  Sidelying Right  Sidelying Right Duration 30 sec  Sidelying Right Symptoms Upbeat Nystagmus (lasted very briefly)  Sidelying Left  Sidelying Left Duration 30 sec  Sidelying Left Symptoms No nystagmus  Horizontal Canal Right  Horizontal Canal Right Duration 30 sec  Horizontal Canal Right Symptoms Normal  Horizontal Canal Left  Horizontal Canal Left Duration 30 sec  Horizontal Canal Left Symptoms Normal        Pertinent Vitals/Pain Pain Assessment: No/denies pain Faces Pain Scale: Hurts little more Pain Location: B feet with bed with movement Pain Descriptors / Indicators: Grimacing;Moaning Pain Intervention(s): Monitored during session;Limited activity within patient's tolerance    Home Living                      Prior Function            PT Goals (current goals can now be found in the care plan section) Acute Rehab PT Goals Patient Stated Goal: to feel better  Progress towards PT goals: Not progressing toward goals - comment (mobility deferred in preference for vestibular eval; sitting balance decreased from previous session and assist for supine to sit increased this session)    Frequency  Min 3X/week    PT Plan Current plan remains appropriate    Co-evaluation             End of Session   Activity Tolerance: Other (comment) (limited by dizziness) Patient left: in bed;with call bell/phone within reach;with bed alarm set     Time: 1505-1530 PT Time Calculation (min) (ACUTE ONLY): 25 min  Charges:  $Therapeutic Activity: 8-22 mins $Neuromuscular Re-education: 8-22 mins                    G Codes:      Gaspare Netzel,CYNDI 05/11/2015, 3:41 PM  Sheran Lawlessyndi Charmane Protzman,  PT [phone] 05/11/2015

## 2015-05-11 NOTE — Progress Notes (Signed)
[facility] PROGRESS NOTE   Jaquis Picklesimer ZOX:[id number] DOB: [birth year] DOA: 05/09/2015 PCP: Holland Commons, NP  HPI/Subjective: Report is still having dizziness. Seen by PT and recommended PT for vestibular evaluation  Assessment/Plan: Principal Problem:   Orthostasis Active Problems:   History of stroke   Chronic systolic CHF (congestive heart failure)   Type II diabetes mellitus with renal manifestations, uncontrolled   Chronic kidney disease, stage 3   COPD (chronic obstructive pulmonary disease)   Dementia with Lewy bodies   Dizziness and giddiness   Dizziness and giddiness Patient is a poor historian, sometimes describes spinning and sometimes describes head floating. CT scan is negative, refused MRI of the brain. Mention something about "crystals" in the ears, he was seen before for this. Started on meclizine, still has symptoms, PT for vestibular evaluation.  Orthostatic hypotension Patient was orthostatic on admission, held some of his blood pressure medications and given IV fluids. Patient is still orthostatic, his BP was 147 when he was lying, and dropped down to 126 upon standing. Continue to hold blood pressure medications, continue IV fluids.  Chronic systolic CHF No symptoms or signs to suggest decompensation, evaluate the need for IV fluids who a.m. again.  COPD No exacerbation, continue bronchodilation is.  Code Status: Full Code Family Communication: Plan discussed with the patient. Disposition Plan: Remains inpatient Diet: Diet heart healthy/carb modified Room service appropriate?: Yes; Fluid consistency:: Thin  Consultants:   none  Procedures:   none  Antibiotics:   none  Objective: Filed Vitals:   05/11/15 1324  BP: 152/95  Pulse: 101  Temp: 98 F (36.7 C)  Resp: 18    Intake/Output Summary (Last 24 hours) at 05/11/15 1353 Last data filed at 05/11/15 1328  Gross per 24 hour  Intake      0 ml  Output    450 ml  Net   -450  ml   There were no vitals filed for this visit.  Exam: General: Alert and awake, oriented x3, not in any acute distress. HEENT: anicteric sclera, pupils reactive to light and accommodation, EOMI CVS: S1-S2 clear, no murmur rubs or gallops Chest: clear to auscultation bilaterally, no wheezing, rales or rhonchi Abdomen: soft nontender, nondistended, normal bowel sounds, no organomegaly Extremities: no cyanosis, clubbing or edema noted bilaterally Neuro: Cranial nerves II-XII intact, no focal neurological deficits  Data Reviewed: Basic Metabolic Panel:  Recent Labs Lab 05/09/15 1919 05/10/15 0520 05/11/15 0511  NA 133* 137 138  K 3.5 3.9 3.5  CL 95* 99* 103  CO2 GLUCOSE 280* 218* 86  BUN CREATININE 1.29* 1.11 0.98  CALCIUM 8.6* 8.1* 8.7*   Liver Function Tests: No results for input(s): AST, ALT, ALKPHOS, BILITOT, PROT, ALBUMIN in the last 168 hours. No results for input(s): LIPASE, AMYLASE in the last 168 hours. No results for input(s): AMMONIA in the last 168 hours. CBC:  Recent Labs Lab 05/09/15 1919 05/10/15 0520 05/11/15 0511  WBC 4.5 4.7 6.2  HGB 12.6* 12.2* 13.3  HCT 39.0 37.7* 40.9  MCV 90.7 92.0 90.9  PLT 133* 123* 138*   Cardiac Enzymes: No results for input(s): CKTOTAL, CKMB, CKMBINDEX, TROPONINI in the last 168 hours. BNP (last 3 results)  Recent Labs  02/08/15 1502 02/24/15 1911 03/26/15 1516  BNP 1355.4* 1739.5* 1712.3*    ProBNP (last 3 results)  Recent Labs  08/12/14 1756  PROBNP 7427.0*    CBG:  Recent Labs Lab 05/10/15 1202 05/10/15 1638 05/10/15  2216 05/11/15 0640 05/11/15 1127  GLUCAP 167* 77 171* 84 140*    Micro Recent Results (from the past 240 hour(s))  Clostridium Difficile by PCR (not at [facility])     Status: None   Collection Time: 05/10/15  4:30 PM  Result Value Ref Range Status   C difficile by pcr NEGATIVE NEGATIVE Final     Studies: Ct Head Wo Contrast  05/10/2015   CLINICAL DATA:   Dizziness with blurred vision for 2 days. Patient denies headaches. Initial encounter.  EXAM: CT HEAD WITHOUT CONTRAST  TECHNIQUE: Contiguous axial images were obtained from the base of the skull through the vertex without intravenous contrast.  COMPARISON:  05/09/2015.  FINDINGS: There is no evidence of acute intracranial hemorrhage, mass lesion, brain edema or extra-axial fluid collection. The ventricles and subarachnoid spaces are prominent but stable. There is no CT evidence of acute cortical infarction. There are stable small vessel ischemic changes in the periventricular white matter with an old left internal capsule lacunar infarct.  The visualized paranasal sinuses, mastoid air cells and middle ears are clear. The calvarium is intact.  IMPRESSION: Stable head CT.  No acute intracranial findings demonstrated.   Electronically Signed   By: Carey BullocksWilliam  Veazey M.D.   On: 05/10/2015 17:53   Ct Head Wo Contrast  05/09/2015   CLINICAL DATA:  Dizziness beginning yesterday. Numbness of the legs.  EXAM: CT HEAD WITHOUT CONTRAST  TECHNIQUE: Contiguous axial images were obtained from the base of the skull through the vertex without intravenous contrast.  COMPARISON:  None.  FINDINGS: No evidence of acute infarction. The brainstem and cerebellum are normal. In the cerebral hemispheres, there is an old lacunar infarction in the left internal capsule and mild small vessel change of the deep white matter. No cortical or large vessel territory abnormality. No mass lesion, hemorrhage, hydrocephalus or extra-axial collection. No calvarial abnormality. Mild maxillary mucosal thickening.  IMPRESSION: No acute finding. 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 Ordinary mild chronic small-vessel change of the hemispheric white matter.   Electronically Signed   By: Paulina FusiMark  Shogry M.D.   On: 05/09/2015 21:17    Scheduled Meds: . aspirin  81 mg Oral Daily  . atorvastatin  80 mg Oral q1800  . collagenase   Topical Daily  .  ferrous sulfate  325 mg Oral BID WC  . gabapentin  600 mg Oral TID  . glimepiride  4 mg Oral Q breakfast  . hydrALAZINE  10 mg Oral TID  . insulin aspart  0-15 Units Subcutaneous TID WC  . meclizine  25 mg Oral TID  . pantoprazole  40 mg Oral Daily  . potassium chloride SA  20 mEq Oral Daily   Continuous Infusions: . sodium chloride 125 mL/hr at 05/10/15 0119       Time spent: 35 minutes    [address] [facility] CenterELMAHI,Nyela Cortinas A  [facility] Pager [phone] If 7PM-7AM, please contact night-coverage at [email or web address], password [facility] 05/11/2015, 1:53 PM

## 2015-05-11 NOTE — Progress Notes (Signed)
Occupational Therapy Treatment Patient Details Name: Russell Hamilton MRN: [id number] DOB: 06/05/1941 Today's Date: 05/11/2015    History of present illness 74 yo male h/o chf ef 40%, ckd, htn, stoke, copd comes in with one week of dizziness    OT comments  Pt progressing towards acute OT goals. Pt completed ADLs as detailed below. Pt continues to present with decreased balance and cognitive deficits impacting level of assist with ADLs. Pt with 1 LOB ambulating to bathroom requiring mod A. Min A for sit<>stand transfers due to balance. Pt continues to report dizziness. Pt reports dizziness is worse with head turns to the right. D/c plan remains appropriate.   Follow Up Recommendations  SNF;Supervision/Assistance - 24 hour    Equipment Recommendations  None recommended by OT    Recommendations for Other Services      Precautions / Restrictions Precautions Precautions: Fall Restrictions Weight Bearing Restrictions: No       Mobility Bed Mobility Overal bed mobility: Modified Independent             General bed mobility comments: Extra time and effort  Transfers Overall transfer level: Needs assistance Equipment used: Straight cane Transfers: Sit to/from Stand Sit to Stand: Min assist         General transfer comment: Min A for balance with pt reporting continued dizziness    Balance Overall balance assessment: Needs assistance;History of Falls Sitting-balance support: No upper extremity supported;Feet supported Sitting balance-Leahy Scale: Fair Sitting balance - Comments: good static sitting, fair dynamic sitting   Standing balance support: Single extremity supported;During functional activity Standing balance-Leahy Scale: Poor Standing balance comment: min A and external support to complete ADLs standing at sink                    ADL Overall ADL's : Needs assistance/impaired     Grooming: Oral care;Wash/dry face;Wash/dry hands;Minimal  assistance;Standing Grooming Details (indicate cue type and reason): min A for balance with noted swaying  Upper Body Bathing: Min guard;Sitting Upper Body Bathing Details (indicate cue type and reason): min guard for safety  Lower Body Bathing: Min guard;Sitting/lateral leans Lower Body Bathing Details (indicate cue type and reason): Pt stated he did not want someone with him during perianal cleaning. Therapist instructed pt in sitting/lateral leans which pt performed.         Toilet Transfer: Minimal assistance;Ambulation;BSC (straight cane) Toilet Transfer Details (indicate cue type and reason): min A for balance         Functional mobility during ADLs: Min guard;Moderate assistance;Cane (1 LOB ambulating to bathroom requiring mod A) General ADL Comments: Pt presents with decreased standing balance and cognitive deficits impacting his level of assist with ADLs. Instructed pt to have someone with him when ambulating due to LOB during ambulation. Educated on fall prevention techniques for ADLs, Pt lives alone with limited assist. Reports he has family living nearby but "they all work."      Emergency planning/management officerVision                     Perception     Praxis      Cognition   Behavior During Therapy: Agitated Overall Cognitive Status: Impaired/Different from baseline Area of Impairment: Attention;Memory;Following commands;Safety/judgement;Problem solving;Awareness   Current Attention Level: Sustained Memory: Decreased recall of precautions;Decreased short-term memory  Following Commands: Follows one step commands consistently Safety/Judgement: Decreased awareness of safety   Problem Solving: Requires verbal cues;Requires tactile cues;Difficulty sequencing General Comments: Pt completed grooming tasks (oral care,  UB/LB bathing) at sink. Pt appeared to have a clearer recall of events over the past few days but demonstrated some lingering cognitive deficits. Pt reports in present tense that  he likes to tell stories to his sister ("she's like a sister to me anyway") who is 42 yo. Therpist asked for clarification on relationship and age of sister with pt stating "yes." Pt directed to sit for oral care but remained standing despite swaying in standing position. Pt with 1 LOB ambulating with therapist to bathroom but reports he is walking just fine. Pt states recent episode of dizziness started July 3rd but mentioned on a previous stay at [facility] having a vestibular evaluation ("They made me turn my head and look this way ans that"    Extremity/Trunk Assessment               Exercises     Shoulder Instructions       General Comments  Bp at end of session sitting in recliner: 152/95.    Pertinent Vitals/ Pain       Pain Assessment: Faces Faces Pain Scale: Hurts little more Pain Location: B feet with bed with movement Pain Descriptors / Indicators: Grimacing;Moaning Pain Intervention(s): Monitored during session;Limited activity within patient's tolerance  Home Living                                          Prior Functioning/Environment              Frequency Min 2X/week     Progress Toward Goals  OT Goals(current goals can now be found in the care plan section)  Progress towards OT goals: Progressing toward goals  Acute Rehab OT Goals Patient Stated Goal: to feel better  OT Goal Formulation: With patient Time For Goal Achievement: 05/24/15 Potential to Achieve Goals: Fair ADL Goals Pt Will Perform Grooming: with supervision;standing Pt Will Perform Upper Body Bathing: with supervision;sitting Pt Will Perform Lower Body Bathing: with supervision;sit to/from stand Pt Will Perform Upper Body Dressing: with supervision;sitting Pt Will Perform Lower Body Dressing: with supervision;sit to/from stand Pt Will Transfer to Toilet: with supervision;ambulating;regular height toilet;grab bars Pt Will Perform Toileting - Clothing Manipulation and  hygiene: with supervision;sit to/from stand Pt Will Perform Tub/Shower Transfer: Tub transfer;with supervision;ambulating  Plan Discharge plan remains appropriate    Co-evaluation                 End of Session Equipment Utilized During Treatment: Gait belt;Other (comment) (cane)   Activity Tolerance Other (comment) (dizziness)   Patient Left in chair;with call bell/phone within reach;with chair alarm set   Nurse Communication Mobility status;Other (comment) (chair alarm set, 1 LOB ambulating to bathroom )    Functional Assessment Tool Used: clinical judgement Functional Limitation: Self care Self Care Current Status (W2956): At least 40 percent but less than 60 percent impaired, limited or restricted Self Care Goal Status (O1308): At least 1 percent but less than 20 percent impaired, limited or restricted   Time: 6578-4696 OT Time Calculation (min): 37 min  Charges: OT G-codes **NOT FOR INPATIENT CLASS** Functional Assessment Tool Used: clinical judgement Functional Limitation: Self care Self Care Current Status (E9528): At least 40 percent but less than 60 percent impaired, limited or restricted Self Care Goal Status (U1324): At least 1 percent but less than 20 percent impaired, limited or restricted OT General Charges $OT Visit: 1  Procedure OT Treatments $Self Care/Home Management : 38-52 mins  Pilar Grammes 05/11/2015, 2:04 PM

## 2015-05-12 DIAGNOSIS — I951 Orthostatic hypotension: Secondary | ICD-10-CM | POA: Diagnosis not present

## 2015-05-12 DIAGNOSIS — J438 Other emphysema: Secondary | ICD-10-CM | POA: Diagnosis not present

## 2015-05-12 DIAGNOSIS — N183 Chronic kidney disease, stage 3 (moderate): Secondary | ICD-10-CM | POA: Diagnosis not present

## 2015-05-12 DIAGNOSIS — I5022 Chronic systolic (congestive) heart failure: Secondary | ICD-10-CM | POA: Diagnosis not present

## 2015-05-12 LAB — GLUCOSE, CAPILLARY: Glucose-Capillary: 112 mg/dL — ABNORMAL HIGH (ref 65–99)

## 2015-05-12 LAB — BASIC METABOLIC PANEL
Anion gap: 7 (ref 5–15)
BUN: 11 mg/dL (ref 6–20)
CO2: 26 mmol/L (ref 22–32)
Calcium: 8.4 mg/dL — ABNORMAL LOW (ref 8.9–10.3)
Chloride: 102 mmol/L (ref 101–111)
Creatinine, Ser: 0.98 mg/dL (ref 0.61–1.24)
GFR calc Af Amer: >60 mL/min (ref >60)
Glucose, Bld: 119 mg/dL — ABNORMAL HIGH (ref 65–99)
Potassium: 3.9 mmol/L (ref 3.5–5.1)
Sodium: 135 mmol/L (ref 135–145)

## 2015-05-12 LAB — CBC
HCT: 38.6 % — ABNORMAL LOW (ref 39.0–52.0)
Hemoglobin: 12.5 g/dL — ABNORMAL LOW (ref 13.0–17.0)
MCH: 29.4 pg (ref 26.0–34.0)
MCHC: 32.4 g/dL (ref 30.0–36.0)
MCV: 90.8 fL (ref 78.0–100.0)
Platelets: 134 10*3/uL — ABNORMAL LOW (ref 150–400)
RBC: 4.25 MIL/uL (ref 4.22–5.81)
RDW: 16 % — AB (ref 11.5–15.5)
WBC: 5.6 10*3/uL (ref 4.0–10.5)

## 2015-05-12 MED ORDER — FLUTICASONE PROPIONATE 50 MCG/ACT NA SUSP
1.0000 | Freq: Every day | NASAL | Status: DC
  Filled 2015-05-12: qty 16

## 2015-05-12 NOTE — Care Management Note (Signed)
Case Management Note  Patient Details  Name: Russell Hamilton MRN: [id number] Date of Birth: December 13, 1940  Subjective/Objective:                    Action/Plan: Met with patient to discuss home health services.  Patient strongly refuses home health services, stating that he's "not letting anybody into his apartment".  CM explained in detail that home health could assist with wound care and provide therapy in the home since he has to rely on others for transportation.  Patient would not allow CM to set any services up.  With patient's permission, CM made an appointment at the [facility].  The earliest available appointment is Tuesday, August 2nd at 0915.  Patient stated that he did not want to wait for an appointment, indicating that he wanted to be seen much sooner.  CM again offered home health services and explained that there were no earlier appointments.  Again, patient refused.  CM notified Dr Hartford Poli of patient's refusal.  CM also spoke with Janett Billow at the [facility] to provide an updated.  Patient is discharging home today.  Expected Discharge Date:                  Expected Discharge Plan:  Home/Self Care (Pt refuses home health services)  In-House Referral:     Discharge planning Services  CM Consult  Post Acute Care Choice:    Choice offered to:  Patient  DME Arranged:    DME Agency:     HH Arranged:    [place] Agency:     Status of Service:  Completed, signed off  Medicare Important Message Given:    Date Medicare IM Given:    Medicare IM give by:    Date Additional Medicare IM Given:    Additional Medicare Important Message give by:     If discussed at [place] of Stay Meetings, dates discussed:    Additional Comments:  Rolm Baptise, RN 05/12/2015, 11:47 AM

## 2015-05-12 NOTE — Discharge Summary (Signed)
Physician Discharge Summary  Nat MathJames Chestnutt ZOX:[id number] DOB: 06/19/1941 DOA: 05/09/2015  PCP: Ambrose FinlandValerie A Keck, NP  Admit date: 05/09/2015 Discharge date: 05/12/2015  Time spent: 40 minutes  Recommendations for Outpatient Follow-up:  1. Follow-up with primary care physician. 2. Home health service to follow including PT, OT, RN, aide and CSW  Discharge Diagnoses:  Principal Problem:   Orthostasis Active Problems:   History of stroke   Chronic systolic CHF (congestive heart failure)   Type II diabetes mellitus with renal manifestations, uncontrolled   Chronic kidney disease, stage 3   COPD (chronic obstructive pulmonary disease)   Dementia with Lewy bodies   Dizziness and giddiness   Discharge Condition: Stable  Diet recommendation: Heart healthy  History of present illness:  74 yo male h/o chf ef 40%, ckd, htn, stoke, copd comes in with one week of dizziness mainly upon standing but also sometimes happens when he is just lying there. Denies any N/v/d. Denies any fevers. No numbness or tingling anywhere or weakness anywhere. He says he gets really weak and dizzy when he stands up. No rashes. Has received 2 liters of ivf in the ED and feels much better. Is orthostatic in the ED sbp drops into the 80s upon standing. Denies any loc.  Hospital Course:   Dizziness and giddiness Patient is a poor historian, sometimes describes spinning and sometimes describes head floating. CT scan is negative, refused MRI of the brain, multiple times.. Patient started on meclizine for vertigo, seen by PT and recommended vestibular evaluation. This event valve showed mild right BPPV, patient has increased sitting balance issues and increased assist. PT/OT both recommended a SNF, patient was observation and has to be paid out of pocket. Sent home with maximized home health services including PT/OT/RN/Aide/CSW.  Orthostatic hypotension Patient was orthostatic on admission, held some of his blood  pressure medications and given IV fluids. Patient is still orthostatic, his BP was 147 when he was lying, and dropped down to 126 upon standing. Continue to hold blood pressure medications, continue IV fluids.  Chronic systolic CHF No symptoms or signs to suggest decompensation, evaluate the need for IV fluids who a.m. Again. Continued his lisinopril, Coreg, Lasix adjusted to 40 mg daily. No symptoms or signs of decompensation during this hospital stay.  COPD No exacerbation, continue bronchodilation is.  Previous stroke Patient reportedly had CVA in 2010, he had Amer problem since then. Continue aspirin.  Diabetes mellitus type 2 Uncontrolled diabetes mellitus with hemoglobin A1c of 11.5, patient has history of noncompliance with medication. Currently on Amaryl and Januvia, continued. Patient did not want to do any changes for now. Follow-up with primary care physician for further evaluation.   Procedures:  None  Consultations:  None  Discharge Exam: Filed Vitals:   05/12/15 1016  BP: 138/93  Pulse: 97  Temp: 98.1 F (36.7 C)  Resp: 20   General: Alert and awake, oriented x3, not in any acute distress. HEENT: anicteric sclera, pupils reactive to light and accommodation, EOMI CVS: S1-S2 clear, no murmur rubs or gallops Chest: clear to auscultation bilaterally, no wheezing, rales or rhonchi Abdomen: soft nontender, nondistended, normal bowel sounds, no organomegaly Extremities: no cyanosis, clubbing or edema noted bilaterally Neuro: Cranial nerves II-XII intact, no focal neurological deficits  Discharge Instructions   Discharge Instructions    Diet - low sodium heart healthy    Complete by:  As directed      Increase activity slowly    Complete by:  As directed  Current Discharge Medication List    START taking these medications   Details  meclizine (ANTIVERT) 25 MG tablet Take 1 tablet (25 mg total) by mouth 3 (three) times daily. Qty: 30  tablet, Refills: 0      CONTINUE these medications which have CHANGED   Details  furosemide (LASIX) 40 MG tablet Take 1 tablet (40 mg total) by mouth daily. Qty: 30 tablet, Refills: 1      CONTINUE these medications which have NOT CHANGED   Details  albuterol (PROVENTIL HFA;VENTOLIN HFA) 108 (90 BASE) MCG/ACT inhaler Inhale 2 puffs into the lungs every 6 (six) hours as needed for wheezing or shortness of breath. Qty: 1 Inhaler, Refills: 0    aspirin 81 MG chewable tablet Chew 1 tablet (81 mg total) by mouth daily. Qty: 30 tablet, Refills: 3    atorvastatin (LIPITOR) 80 MG tablet Take 1 tablet (80 mg total) by mouth daily at 6 PM. Qty: 30 tablet, Refills: 0    carvedilol (COREG) 3.125 MG tablet Take 1 tablet (3.125 mg total) by mouth 2 (two) times daily with a meal. Qty: 60 tablet, Refills: 2    clotrimazole (LOTRIMIN) 1 % cream Apply 1 application topically 2 (two) times daily. Qty: 45 g, Refills: 1    collagenase (SANTYL) ointment Apply topically daily. Qty: 15 g, Refills: 0    ferrous sulfate 325 (65 FE) MG tablet Take 1 tablet (325 mg total) by mouth 2 (two) times daily with a meal. Qty: 30 tablet, Refills: 0    gabapentin (NEURONTIN) 300 MG capsule Take 2 capsules (600 mg total) by mouth 3 (three) times daily. Qty: 90 capsule, Refills: 0   Associated Diagnoses: Peripheral neuropathy    glimepiride (AMARYL) 4 MG tablet Take 1 tablet (4 mg total) by mouth daily with breakfast. Qty: 30 tablet, Refills: 2    hydrALAZINE (APRESOLINE) 10 MG tablet Take 1 tablet (10 mg total) by mouth 3 (three) times daily. Qty: 90 tablet, Refills: 0    isosorbide mononitrate (IMDUR) 30 MG 24 hr tablet Take 1 tablet (30 mg total) by mouth daily. Qty: 30 tablet, Refills: 2    lisinopril (PRINIVIL,ZESTRIL) 2.5 MG tablet Take 1 tablet (2.5 mg total) by mouth daily. Qty: 30 tablet, Refills: 2    pantoprazole (PROTONIX) 40 MG tablet Take 1 tablet (40 mg total) by mouth daily. Qty: 30 tablet,  Refills: 0    potassium chloride SA (K-DUR,KLOR-CON) 20 MEQ tablet Take 1 tablet (20 mEq total) by mouth daily. Qty: 30 tablet, Refills: 0    sitaGLIPtin (JANUVIA) 50 MG tablet Take 1 tablet (50 mg total) by mouth daily. For diabetes Qty: 30 tablet, Refills: 3   Associated Diagnoses: Type 2 diabetes mellitus with diabetic chronic kidney disease      STOP taking these medications     mometasone-formoterol (DULERA) 100-5 MCG/ACT AERO      nicotine (NICODERM CQ - DOSED IN MG/24 HOURS) 21 mg/24hr patch      tiotropium (SPIRIVA HANDIHALER) 18 MCG inhalation capsule      traMADol (ULTRAM) 50 MG tablet        No Known Allergies Follow-up Information    Follow up with [facility]     On 05/17/2015.   Why:  Appointment on 05/17/15 at 1030 with Dr. Venetia Night.   Contact information:   [address] [place] [address] [phone]       The results of significant diagnostics from this hospitalization (including imaging,  microbiology, ancillary and laboratory) are listed below for reference.    Significant Diagnostic Studies: Ct Head Wo Contrast  05/10/2015   CLINICAL DATA:  Dizziness with blurred vision for 2 days. Patient denies headaches. Initial encounter.  EXAM: CT HEAD WITHOUT CONTRAST  TECHNIQUE: Contiguous axial images were obtained from the base of the skull through the vertex without intravenous contrast.  COMPARISON:  05/09/2015.  FINDINGS: There is no evidence of acute intracranial hemorrhage, mass lesion, brain edema or extra-axial fluid collection. The ventricles and subarachnoid spaces are prominent but stable. There is no CT evidence of acute cortical infarction. There are stable small vessel ischemic changes in the periventricular white matter with an old left internal capsule lacunar infarct.  The visualized paranasal sinuses, mastoid air cells and middle ears are clear. The calvarium is intact.  IMPRESSION: Stable head CT.  No  acute intracranial findings demonstrated.   Electronically Signed   By: Carey Bullocks M.D.   On: 05/10/2015 17:53   Ct Head Wo Contrast  05/09/2015   CLINICAL DATA:  Dizziness beginning yesterday. Numbness of the legs.  EXAM: CT HEAD WITHOUT CONTRAST  TECHNIQUE: Contiguous axial images were obtained from the base of the skull through the vertex without intravenous contrast.  COMPARISON:  None.  FINDINGS: No evidence of acute infarction. The brainstem and cerebellum are normal. In the cerebral hemispheres, there is an old lacunar infarction in the left internal capsule and mild small vessel change of the deep white matter. No cortical or large vessel territory abnormality. No mass lesion, hemorrhage, hydrocephalus or extra-axial collection. No calvarial abnormality. Mild maxillary mucosal thickening.  IMPRESSION: No acute finding. 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 Ordinary mild chronic small-vessel change of the hemispheric white matter.   Electronically Signed   By: Paulina Fusi M.D.   On: 05/09/2015 21:17    Microbiology: Recent Results (from the past 240 hour(s))  Clostridium Difficile by PCR (not at [facility])     Status: None   Collection Time: 05/10/15  4:30 PM  Result Value Ref Range Status   C difficile by pcr NEGATIVE NEGATIVE Final     Labs: Basic Metabolic Panel:  Recent Labs Lab 05/09/15 1919 05/10/15 0520 05/11/15 0511 05/12/15 0313  NA 133* 137 138 135  K 3.5 3.9 3.5 3.9  CL 95* 99* 103 102  CO2 GLUCOSE 280* 218* 86 119*  BUN CREATININE 1.29* 1.11 0.98 0.98  CALCIUM 8.6* 8.1* 8.7* 8.4*   Liver Function Tests: No results for input(s): AST, ALT, ALKPHOS, BILITOT, PROT, ALBUMIN in the last 168 hours. No results for input(s): LIPASE, AMYLASE in the last 168 hours. No results for input(s): AMMONIA in the last 168 hours. CBC:  Recent Labs Lab 05/09/15 1919 05/10/15 0520 05/11/15 0511 05/12/15 0313  WBC 4.5 4.7 6.2 5.6  HGB 12.6*  12.2* 13.3 12.5*  HCT 39.0 37.7* 40.9 38.6*  MCV 90.7 92.0 90.9 90.8  PLT 133* 123* 138* 134*   Cardiac Enzymes: No results for input(s): CKTOTAL, CKMB, CKMBINDEX, TROPONINI in the last 168 hours. BNP: BNP (last 3 results)  Recent Labs  02/08/15 1502 02/24/15 1911 03/26/15 1516  BNP 1355.4* 1739.5* 1712.3*    ProBNP (last 3 results)  Recent Labs  08/12/14 1756  PROBNP 7427.0*    CBG:  Recent Labs Lab 05/11/15 0640 05/11/15 1127 05/11/15 1630 05/11/15 2200 05/12/15 0640  GLUCAP 84 140* 118* 202* 112*       Signed:  Martie Muhlbauer A  [facility] 05/12/2015, 11:04 AM

## 2015-05-12 NOTE — Hospital Discharge Follow-Up (Signed)
Received call from Lorne Skeens, RN CM regarding patient. She indicates she met with patient to discuss [facility] services (SN-for wound care and med management as well as therapy services) and patient refused. She indicates patient did not want anyone to come to his home.  She indicates she arranged a wound care appointment for patient on 06/07/15 at 0915.  No earlier wound care appointments available per [facility].  Patient has a hospital follow-up appointment on 05/17/15 at 62 with Dr. Jarold Song at [facility].

## 2015-05-12 NOTE — Progress Notes (Signed)
Patient DC'd home via cab.  Voucher for cab provided.  DC instructions were refused by patient but still handed paperwork to him.  Vital signs and assessments were refused by patient.

## 2015-05-12 NOTE — Progress Notes (Addendum)
Patient refused all medications this morning.  He was informed the risks for not taking medications and he still refused.  Additionally, he refused dressing change.

## 2015-05-12 NOTE — Progress Notes (Signed)
Patient has refused for nurse and nursing student to perform physical assessment.  He stated, "I'm ok, I don't want to be bothered."

## 2015-05-13 ENCOUNTER — Telehealth: Payer: Self-pay

## 2015-05-13 NOTE — Telephone Encounter (Signed)
Call placed to patient to follow-up on condition and to ensure he has all discharge medications as patient discharged on 05/12/15.  Patient has hospital follow-up appointment on 05/17/15 at 1030 with Dr. Venetia NightAmao. Unable to reach patient; voicemail left requesting return call.

## 2015-05-13 NOTE — Telephone Encounter (Signed)
Received return call from patient. Patient indicates he is breathing "fine," and he is "not very dizzy." Discussed importance of adequate hydration, blood glucose monitoring, and proper diet to control dizziness.  Went over all patient's medications. Patient has all medications except Antivert. Per chart review, script was printed at discharge.  Patient indicates he will go through his discharge paperwork and get [facility] as he needs to pick up medication refills today.  Discussed importance of picking up medications today and taking all medications as prescribed. Patient verbalized understanding. Per phone call from Elmer Balesourtney Robarge, RN CM on 05/12/15, patient declined all home health services.  Discussed home health services again ([facility] and medication management and therapy services) with patient, and he continued to decline home health services and indicated "he was not going to change his mind."   Reminded patient of appointment on 05/17/15 at 1030 with Dr. Venetia NightAmao. He indicated he could not make that appointment and preferred an afternoon appointment. Appointment changed to 05/18/15 at 1600 with Dr. Venetia NightAmao. Patient plans to call a cab for transportation to his appointment on 05/18/15.

## 2015-05-17 ENCOUNTER — Telehealth: Payer: Self-pay

## 2015-05-17 ENCOUNTER — Inpatient Hospital Stay: Payer: Medicare (Managed Care) | Admitting: Family Medicine

## 2015-05-17 NOTE — Telephone Encounter (Signed)
Called the patient to check on his status and to confirm his appointment for tomorrow - 05/18/15 @ 1600. He said that he forgot about the appointment but after being reminded of the date and time, he said that he would be there. He noted that he has a friend that drives a cab and can bring him to the clinic tomorrow.  He said that he has all of is medications but he did not pick up his lancets at the pharmacy the last time he was at the clinic because the wait was too long and his transportation was leaving. He agreed to get them tomorrow.   He also informed this CM that he received a letter from [employer] confirming that he has been dis-enrolled and said that he will need to get a medicare part D plan for his medications.  He noted that he is still trying to get copies of his discharge papers from the service so that [facility].  CM offered to have [facility] assist him if needed and he verbalized understanding.  He said that he is "breathing fine, no shortness of breath" and the " dizzy spells are gone."  No other problems/concerns reported.

## 2015-05-18 ENCOUNTER — Ambulatory Visit: Payer: Medicare Other | Attending: Family Medicine | Admitting: Family Medicine

## 2015-05-18 ENCOUNTER — Encounter: Payer: Self-pay | Admitting: Family Medicine

## 2015-05-18 ENCOUNTER — Telehealth: Payer: Self-pay

## 2015-05-18 VITALS — BP 90/60 | HR 49 | Temp 98.0°F | Resp 18 | Ht 69.5 in | Wt 135.8 lb

## 2015-05-18 DIAGNOSIS — H538 Other visual disturbances: Secondary | ICD-10-CM | POA: Insufficient documentation

## 2015-05-18 DIAGNOSIS — I639 Cerebral infarction, unspecified: Secondary | ICD-10-CM | POA: Diagnosis not present

## 2015-05-18 DIAGNOSIS — I951 Orthostatic hypotension: Secondary | ICD-10-CM | POA: Diagnosis not present

## 2015-05-18 DIAGNOSIS — I10 Essential (primary) hypertension: Secondary | ICD-10-CM | POA: Diagnosis not present

## 2015-05-18 DIAGNOSIS — Z7982 Long term (current) use of aspirin: Secondary | ICD-10-CM | POA: Insufficient documentation

## 2015-05-18 DIAGNOSIS — J449 Chronic obstructive pulmonary disease, unspecified: Secondary | ICD-10-CM | POA: Insufficient documentation

## 2015-05-18 DIAGNOSIS — B353 Tinea pedis: Secondary | ICD-10-CM | POA: Diagnosis not present

## 2015-05-18 DIAGNOSIS — E11621 Type 2 diabetes mellitus with foot ulcer: Secondary | ICD-10-CM | POA: Insufficient documentation

## 2015-05-18 DIAGNOSIS — E1165 Type 2 diabetes mellitus with hyperglycemia: Secondary | ICD-10-CM | POA: Diagnosis present

## 2015-05-18 DIAGNOSIS — Z9114 Patient's other noncompliance with medication regimen: Secondary | ICD-10-CM | POA: Insufficient documentation

## 2015-05-18 DIAGNOSIS — L97521 Non-pressure chronic ulcer of other part of left foot limited to breakdown of skin: Secondary | ICD-10-CM | POA: Diagnosis not present

## 2015-05-18 DIAGNOSIS — Z79899 Other long term (current) drug therapy: Secondary | ICD-10-CM | POA: Diagnosis not present

## 2015-05-18 DIAGNOSIS — K219 Gastro-esophageal reflux disease without esophagitis: Secondary | ICD-10-CM | POA: Insufficient documentation

## 2015-05-18 DIAGNOSIS — I509 Heart failure, unspecified: Secondary | ICD-10-CM | POA: Insufficient documentation

## 2015-05-18 DIAGNOSIS — I2510 Atherosclerotic heart disease of native coronary artery without angina pectoris: Secondary | ICD-10-CM | POA: Diagnosis not present

## 2015-05-18 DIAGNOSIS — Z8673 Personal history of transient ischemic attack (TIA), and cerebral infarction without residual deficits: Secondary | ICD-10-CM | POA: Diagnosis not present

## 2015-05-18 DIAGNOSIS — G629 Polyneuropathy, unspecified: Secondary | ICD-10-CM | POA: Insufficient documentation

## 2015-05-18 DIAGNOSIS — I5043 Acute on chronic combined systolic (congestive) and diastolic (congestive) heart failure: Secondary | ICD-10-CM | POA: Diagnosis not present

## 2015-05-18 DIAGNOSIS — F17210 Nicotine dependence, cigarettes, uncomplicated: Secondary | ICD-10-CM | POA: Insufficient documentation

## 2015-05-18 DIAGNOSIS — E1110 Type 2 diabetes mellitus with ketoacidosis without coma: Secondary | ICD-10-CM

## 2015-05-18 DIAGNOSIS — L97529 Non-pressure chronic ulcer of other part of left foot with unspecified severity: Secondary | ICD-10-CM | POA: Insufficient documentation

## 2015-05-18 DIAGNOSIS — E1310 Other specified diabetes mellitus with ketoacidosis without coma: Secondary | ICD-10-CM | POA: Diagnosis not present

## 2015-05-18 DIAGNOSIS — R42 Dizziness and giddiness: Secondary | ICD-10-CM | POA: Diagnosis not present

## 2015-05-18 LAB — GLUCOSE, POCT (MANUAL RESULT ENTRY): POC Glucose: 194 mg/dl — AB (ref 70–99)

## 2015-05-18 MED ORDER — TERBINAFINE HCL 1 % EX CREA: 1.0000 "application " | TOPICAL_CREAM | Freq: Two times a day (BID) | CUTANEOUS | Status: DC

## 2015-05-18 NOTE — Telephone Encounter (Signed)
Met with the patient when he was at the [facility] for his appointment today and assisted him in completing a request for a c/o his DD214.  The completed form was mailed to [facility], [address], [place] [address].  He said that he is interested in receiving VA benefits but needs this form to be able to access VA services.

## 2015-05-18 NOTE — Progress Notes (Signed)
Patient here for HFU. Pain located in both feet rated at an 8, described as aching and unbearable. Patient reports both feet are swollen. Patient does not want to take the right shoe off of his foot due to the pain being unbearable. Patient reports his legs are swollen from his knees down and are painful. Patient reports feeling a little lightheaded right now.   Patient has not taken his medications today. Patient reports he takes his medications after he leaves his appt.   Patient CBG is 194.

## 2015-05-18 NOTE — Progress Notes (Signed)
Subjective:    Patient ID: Russell Hamilton, male    DOB: October 26, 1941, 74 y.o.   MRN: [id number]  HPI  Russell Hamilton 74 year old male who [facility] and medical history is notable for of uncontrolled Type 2 DM (A1c 9.3), CHF, CAD (who has declined cardiac cath), non compliance with medical management recently hospitalized for respiratory failure secondary to COPD exacerbation.  He has had multiple visits for medication management therapy due to noncompliance and has been found to have hyperglycemia and needing insulin administration in the clinic. We have discussed getting a home nurse for him but he does not want anyone in his home. 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 He was hospitalized between 05/09/15 on 05/12/15 at [facility] for orthostatic hypotension and vertigo. CT scan of his head was unremarkable; he received IV fluids and was seen by physical therapy who recommended vestibular evaluation and exercises but the patient never got to have this done as he was discharged prior to then. He was placed on meclizine which she never picked up.  Today he states my face is blurred; he has had blurry vision in the past but not as bad as it is now and he feels dizzy when asked to lie down on the exam table the dizziness subsided.  Past Medical History  Diagnosis Date  . Hypertension   . Chronic systolic CHF (congestive heart failure)     a. 2D ECHO 08/13/14: EF 40%, diffuse hypokinesis possibly worse in the inferior wall. Mild MR, mild LA dilation, PA pressure 50. Trivial pericardial effusion.  . Acute systolic CHF (congestive heart failure), NYHA class 4 08/12/2014  . Type II diabetes mellitus   . GERD (gastroesophageal reflux disease)   . Peripheral neuropathy     [name] 08/12/2014  . Stroke 2010    "memory problems since; right leg and left arm weakness since" (02/08/2015)  . Ulcers of  both lower extremities     [name] 02/24/2015  . Noncompliance     [name] 02/24/2015  . COPD (chronic obstructive pulmonary disease)     Past Surgical History  Procedure Laterality Date  . Carotid endarterectomy Right 2010  . Hand ligament reconstruction Right ?1970's    "pinky"    Family History  Problem Relation Age of Onset  . Kidney disease Mother     History   Social History  . Marital Status: Married    Spouse Name: N/A  . Number of Children: N/A  . Years of Education: N/A   Occupational History  . Not on file.   Social History Main Topics  . Smoking status: Current Every Day Smoker -- 0.50 packs/day for 56 years    Types: Cigarettes  . Smokeless tobacco: Never Used  . Alcohol Use: No     Comment: 02/08/2015 "I might have a beer q 6 months"  . Drug Use: Yes    Special: Marijuana     Comment: last marijuana use 05/15/15  . Sexual Activity: Not Currently   Other Topics Concern  . Not on file   Social History Narrative    No Known Allergies  Current Outpatient Prescriptions on File Prior to Visit  Medication Sig Dispense Refill  . aspirin 81 MG chewable tablet Chew 1 tablet (81 mg total) by mouth daily. 30 tablet 3  . atorvastatin (LIPITOR) 80 MG tablet Take  1 tablet (80 mg total) by mouth daily at 6 PM. 30 tablet 0  . carvedilol (COREG) 3.125 MG tablet Take 1 tablet (3.125 mg total) by mouth 2 (two) times daily with a meal. 60 tablet 2  . collagenase (SANTYL) ointment Apply topically daily. 15 g 0  . ferrous sulfate 325 (65 FE) MG tablet Take 1 tablet (325 mg total) by mouth 2 (two) times daily with a meal. 30 tablet 0  . furosemide (LASIX) 40 MG tablet Take 1 tablet (40 mg total) by mouth daily. 30 tablet 1  . gabapentin (NEURONTIN) 300 MG capsule Take 2 capsules (600 mg total) by mouth 3 (three) times daily. 90 capsule 0  . glimepiride (AMARYL) 4 MG tablet Take 1 tablet (4 mg total) by mouth daily with breakfast. 30 tablet 2  . hydrALAZINE (APRESOLINE) 10 MG  tablet Take 1 tablet (10 mg total) by mouth 3 (three) times daily. 90 tablet 0  . isosorbide mononitrate (IMDUR) 30 MG 24 hr tablet Take 1 tablet (30 mg total) by mouth daily. 30 tablet 2  . lisinopril (PRINIVIL,ZESTRIL) 2.5 MG tablet Take 1 tablet (2.5 mg total) by mouth daily. 30 tablet 2  . meclizine (ANTIVERT) 25 MG tablet Take 1 tablet (25 mg total) by mouth 3 (three) times daily. 30 tablet 0  . pantoprazole (PROTONIX) 40 MG tablet Take 1 tablet (40 mg total) by mouth daily. 30 tablet 0  . potassium chloride SA (K-DUR,KLOR-CON) 20 MEQ tablet Take 1 tablet (20 mEq total) by mouth daily. 30 tablet 0  . albuterol (PROVENTIL HFA;VENTOLIN HFA) 108 (90 BASE) MCG/ACT inhaler Inhale 2 puffs into the lungs every 6 (six) hours as needed for wheezing or shortness of breath. (Patient not taking: Reported on 05/18/2015) 1 Inhaler 0  . clotrimazole (LOTRIMIN) 1 % cream Apply 1 application topically 2 (two) times daily. (Patient not taking: Reported on 05/18/2015) 45 g 1  . sitaGLIPtin (JANUVIA) 50 MG tablet Take 1 tablet (50 mg total) by mouth daily. For diabetes 30 tablet 3   No current facility-administered medications on file prior to visit.      Review of Systems  Constitutional: Negative for activity change and appetite change.  HENT: Negative for sinus pressure and sore throat.   Eyes: Negative for visual disturbance.  Respiratory: Negative for chest tightness and shortness of breath.   Cardiovascular: Negative for chest pain and palpitations.  Gastrointestinal: Negative for abdominal pain and abdominal distention.  Endocrine: Negative for cold intolerance, heat intolerance and polyphagia.  Genitourinary: Negative for dysuria, frequency and difficulty urinating.  Musculoskeletal: Negative for back pain, joint swelling and arthralgias.       Foot pain  Skin: Positive for wound.  Neurological: Positive for light-headedness. Negative for dizziness, tremors and weakness.  Psychiatric/Behavioral:  Negative for suicidal ideas and behavioral problems.         Objective: Filed Vitals:   05/18/15 1553 05/18/15 1657 05/18/15 1659 05/18/15 1700  BP: 96/56 122/70 109/62 90/60  Pulse: 49     Temp: 98 F (36.7 C)     TempSrc: Oral     Resp: 18     Height:      Weight:      SpO2: 97%          Physical Exam  Constitutional: He is oriented to person, place, and time. He appears well-developed and well-nourished.  HENT:  Head: Normocephalic and atraumatic.  Right Ear: External ear normal.  Left Ear: External ear normal.  Eyes: Conjunctivae  and EOM are normal. Pupils are equal, round, and reactive to light.  Neck: Normal range of motion. Neck supple. No JVD present. No tracheal deviation present.  Cardiovascular: Regular rhythm and normal heart sounds.  Bradycardia present.   No murmur heard. Pulmonary/Chest: Effort normal and breath sounds normal. No respiratory distress. He has no wheezes. He exhibits no tenderness.  Abdominal: Soft. Bowel sounds are normal. He exhibits no mass. There is no tenderness.  Musculoskeletal: Normal range of motion. He exhibits edema (bilateral pitting edema up to mid shin) and tenderness.  Neurological: He is alert and oriented to person, place, and time.  Skin:  Tinea pedis in between toes of both feet, edema of feet, peeling of some skin of the dorsum around base of toes. Left foot with ulcer on lateral aspect of foot with superficial scab which is mildly tender to palpation, no discharge noted.  Psychiatric: He has a normal mood and affect.            Assessment & Plan:   74 year old male who [facility] and medical history is notable for of uncontrolled Type 2 DM (A1c 9.3), CHF, CAD (who has declined cardiac cath), with recent hospitalization for orthostatic hypotension and vertigo.  Hypertension: He is currently hypotensive at this time and so I have placed all his antihypertensives on hold as well as his  Lasix and reassess his blood pressure at his next visit. I am concerned he may be overmedicating himself as he sometimes forgets his medications He continues to refuse home care and states "if I die it is God's will " I will see him back in 2 days to follow up on this.  Orthostatic hypotension: He is positive for orthostatics Advised to change positions slowly.   Vertigo: He never picked up the meclizine prescription which was sent to the pharmacy and so I have advised him to do so.  Tinea pedis: Placed on Lamisil.  Left foot ulcer: Advised to wear loose fitting shoes; keep appointment with wound care Dressing change with dry gauze performed in the clinic.  Congestive heart failure: Ejection fraction of 30-35% from 2-D echo of 02/2015 No evidence of acute decompensation at this time. Lasix currently on hold due to hypotension.  This note has been created with Education officer, environmental. Any transcriptional errors are unintentional.

## 2015-05-18 NOTE — Patient Instructions (Signed)

## 2015-05-19 NOTE — Progress Notes (Signed)
Quick Note:  Labs addressed at office as well as medications and patient was made aware. ______ 

## 2015-05-20 ENCOUNTER — Encounter: Payer: Self-pay | Admitting: Family Medicine

## 2015-05-20 ENCOUNTER — Ambulatory Visit: Payer: Medicare Other | Attending: Family Medicine | Admitting: Family Medicine

## 2015-05-20 VITALS — BP 136/84 | HR 92 | Temp 97.5°F | Resp 16 | Wt 154.0 lb

## 2015-05-20 DIAGNOSIS — E1165 Type 2 diabetes mellitus with hyperglycemia: Secondary | ICD-10-CM | POA: Diagnosis not present

## 2015-05-20 DIAGNOSIS — I951 Orthostatic hypotension: Secondary | ICD-10-CM | POA: Diagnosis not present

## 2015-05-20 DIAGNOSIS — I10 Essential (primary) hypertension: Secondary | ICD-10-CM | POA: Diagnosis not present

## 2015-05-20 DIAGNOSIS — I639 Cerebral infarction, unspecified: Secondary | ICD-10-CM | POA: Diagnosis not present

## 2015-05-20 DIAGNOSIS — Z720 Tobacco use: Secondary | ICD-10-CM

## 2015-05-20 DIAGNOSIS — H8113 Benign paroxysmal vertigo, bilateral: Secondary | ICD-10-CM | POA: Diagnosis not present

## 2015-05-20 LAB — GLUCOSE, POCT (MANUAL RESULT ENTRY): POC Glucose: 139 mg/dl — AB (ref 70–99)

## 2015-05-20 MED ORDER — ACETAMINOPHEN-CODEINE #3 300-30 MG PO TABS: 1.0000 | ORAL_TABLET | Freq: Three times a day (TID) | ORAL | Status: DC | PRN

## 2015-05-20 MED ORDER — FUROSEMIDE 40 MG PO TABS: 40.0000 mg | ORAL_TABLET | Freq: Every day | ORAL | Status: DC

## 2015-05-20 NOTE — Patient Instructions (Signed)

## 2015-05-20 NOTE — Progress Notes (Signed)
Subjective:    Patient ID: Russell Hamilton, male    DOB: 04/18/1941, 74 y.o.   MRN: [id number]  HPI  Russell Hamilton 74 year old male who [facility] and medical history is notable for of uncontrolled Type 2 DM (A1c 9.3), CHF, CAD (who has declined cardiac cath), non compliance with medical management recently hospitalized for respiratory failure secondary to COPD exacerbation.  He has had multiple visits for medication management therapy due to noncompliance and has been found to have hyperglycemia and needing insulin administration in the clinic. We have discussed getting a home nurse for him but he does not want anyone in his home. 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 He was hospitalized between 05/09/15 on 05/12/15 [facility] for orthostatic hypotension and vertigo. CT scan of his head was unremarkable; he received IV fluids and was seen by physical therapy who recommended vestibular evaluation and exercises but the patient never got to have this done as he was discharged prior to then. He was placed on meclizine which she never picked up.  He was dizzy at his last visit and so I had held all of his antihypertensives and brought him in here for follow-up of his blood pressure.  Interval history: Today he states he feels a lot better and is no longer dizzy, he is yet to pick up the meclizine which was prescribed and has also not gotten flip-flops to put on his feet and continues to wear closed shoes all day despite the fact that he has pedal edema. He complains of pain in both feet and gabapentin does not help much.  Past Medical History  Diagnosis Date  . Hypertension   . Chronic systolic CHF (congestive heart failure)     a. 2D ECHO 08/13/14: EF 40%, diffuse hypokinesis possibly worse in the inferior wall. Mild MR, mild LA dilation, PA pressure 50. Trivial pericardial effusion.  .  Acute systolic CHF (congestive heart failure), NYHA class 4 08/12/2014  . Type II diabetes mellitus   . GERD (gastroesophageal reflux disease)   . Peripheral neuropathy     [name]/notes 08/12/2014  . Stroke 2010    "memory problems since; right leg and left arm weakness since" (02/08/2015)  . Ulcers of both lower extremities     [name]/notes 02/24/2015  . Noncompliance     [name]/notes 02/24/2015  . COPD (chronic obstructive pulmonary disease)     Past Surgical History  Procedure Laterality Date  . Carotid endarterectomy Right 2010  . Hand ligament reconstruction Right ?1970's    "pinky"    Family History  Problem Relation Age of Onset  . Kidney disease Mother     History   Social History  . Marital Status: Married    Spouse Name: N/A  . Number of Children: N/A  . Years of Education: N/A   Occupational History  . Not on file.   Social History Main Topics  . Smoking status: Current Every Day Smoker -- 0.50 packs/day for 56 years    Types: Cigarettes  . Smokeless tobacco: Never Used  . Alcohol Use: No     Comment: 02/08/2015 "I might have a beer q 6 months"  . Drug Use: Yes    Special: Marijuana     Comment: last marijuana use 05/15/15  . Sexual Activity: Not Currently   Other Topics Concern  .  Not on file   Social History Narrative    No Known Allergies  Current Outpatient Prescriptions on File Prior to Visit  Medication Sig Dispense Refill  . aspirin 81 MG chewable tablet Chew 1 tablet (81 mg total) by mouth daily. 30 tablet 3  . atorvastatin (LIPITOR) 80 MG tablet Take 1 tablet (80 mg total) by mouth daily at 6 PM. 30 tablet 0  . collagenase (SANTYL) ointment Apply topically daily. 15 g 0  . gabapentin (NEURONTIN) 300 MG capsule Take 2 capsules (600 mg total) by mouth 3 (three) times daily. 90 capsule 0  . glimepiride (AMARYL) 4 MG tablet Take 1 tablet (4 mg total) by mouth daily with breakfast. 30 tablet 2  . hydrALAZINE (APRESOLINE) 10 MG tablet Take 1 tablet (10 mg total) by  mouth 3 (three) times daily. 90 tablet 0  . isosorbide mononitrate (IMDUR) 30 MG 24 hr tablet Take 1 tablet (30 mg total) by mouth daily. 30 tablet 2  . lisinopril (PRINIVIL,ZESTRIL) 2.5 MG tablet Take 1 tablet (2.5 mg total) by mouth daily. 30 tablet 2  . meclizine (ANTIVERT) 25 MG tablet Take 1 tablet (25 mg total) by mouth 3 (three) times daily. 30 tablet 0  . pantoprazole (PROTONIX) 40 MG tablet Take 1 tablet (40 mg total) by mouth daily. 30 tablet 0  . potassium chloride SA (K-DUR,KLOR-CON) 20 MEQ tablet Take 1 tablet (20 mEq total) by mouth daily. 30 tablet 0  . sitaGLIPtin (JANUVIA) 50 MG tablet Take 1 tablet (50 mg total) by mouth daily. For diabetes 30 tablet 3  . terbinafine (LAMISIL AT) 1 % cream Apply 1 application topically 2 (two) times daily. 30 g 0  . clotrimazole (LOTRIMIN) 1 % cream Apply 1 application topically 2 (two) times daily. (Patient not taking: Reported on 05/18/2015) 45 g 1  . ferrous sulfate 325 (65 FE) MG tablet Take 1 tablet (325 mg total) by mouth 2 (two) times daily with a meal. (Patient not taking: Reported on 05/20/2015) 30 tablet 0   No current facility-administered medications on file prior to visit.      Review of Systems  General: negative for fever, weight loss, appetite change Eyes: no visual symptoms. ENT: no ear symptoms, no sinus tenderness, no nasal congestion or sore throat. Neck: no pain  Respiratory: no wheezing, shortness of breath, cough Cardiovascular: no chest pain, no dyspnea on exertion, positive for pedal edema, no orthopnea. Gastrointestinal: no abdominal pain, no diarrhea, no constipation Genito-Urinary: no urinary frequency, no dysuria, no polyuria. Hematologic: no bruising Endocrine: no cold or heat intolerance Neurological: no headaches, no seizures, no tremors Musculoskeletal: no joint pains, no joint swelling Skin: foot ulcers Psychological: no depression, no anxiety,       Objective: Filed Vitals:   05/20/15 1610  BP:  136/84  Pulse: 92  Temp: 97.5 F (36.4 C)  Resp: 16  Weight: 154 lb (69.854 kg)  SpO2: 98%      Physical Exam  Constitutional: normal appearing,  Neck: normal range of motion, no thyromegaly, no JVD Cardiovascular: normal rate and rhythm, normal heart sounds, no murmurs, rub or gallop, 2+ bilateral non pitting pedal edema Respiratory: clear to auscultation bilaterally, no wheezes, no rales, no rhonchi Abdomen: soft, not tender to palpation, normal bowel sounds, no enlarged organs Extremities: Full ROM, no tenderness in joints Skin: minute ulcers on the dorsum of his foot at the bases of his toes with erosion of epidermis and minimal serous drainage bilaterally, lateral left foot ulcer with  yellowish eschar is still present. Neurological: alert, oriented x3, cranial nerves I-XII grossly intact , normal motor strength, normal sensation. Psychological: normal mood.         Assessment & Plan:  74 year old male who [facility] and medical history is notable for of uncontrolled Type 2 DM (A1c 9.3), CHF, CAD (who has declined cardiac cath), with recent episodes of orthostatic hypotension and vertigo.  Hypertension: Hypotension has improved; antihypertensives listed on hold but I have resumed his Lasix due to his pedal edema. I will see him back in one week to follow up on this.  Orthostatic hypotension: Asymptomatic at this time. Advised to change positions slowly.   Vertigo: He never picked up the meclizine prescription which was sent to the pharmacy and states he will pick it up if he becomes symptomatic  Tinea pedis: Yet to pick up Lamisil.  Left foot ulcer: Advised to wear loose fitting shoes; keep appointment with wound care Dressing change with dry gauze performed in the clinic. Tylenol #3 for pain.  Congestive heart failure: Ejection fraction of 30-35% from 2-D echo of 02/2015 No evidence of acute decompensation at this  time. Continue Lasix.  Type 2 diabetes mellitus: Uncontrolled with A1c of 9.3 but CBG in the clinic of 139 reveals some improvement. Continue current medications.   Tobacco abuse: Smoking cessation support: smoking cessation hotline: 1-800-QUIT-NOW.  Smoking cessation classes are available through [facility]. Call 6410145210 or visit our website at [email or web address].  Spent 3 minutes counseling on smoking cessation and patient is not ready to quit.   This note has been created with Education officer, environmental. Any transcriptional errors are unintentional.

## 2015-05-20 NOTE — Progress Notes (Signed)
Patient here for follow up of HTN Patient complains of bilateral foot pain and is requesting pain medication as he says the gabapentin is not helping He doesn't not need refills

## 2015-05-21 NOTE — Progress Notes (Signed)
Quick Note:  Labs addressed at office as well as medications and patient was made aware. ______ 

## 2015-05-26 ENCOUNTER — Telehealth: Payer: Self-pay | Admitting: *Deleted

## 2015-05-26 NOTE — Telephone Encounter (Signed)
Spoke to patient and verified name and date of birth.  Verified patient's appointment for tomorrow at 1400.  Patient states he will be here and he wants "something done about all this dead skin on my legs and these sores on my legs."  I let him know the MD would talk to him tomorrow about his issues and he was appreciative.

## 2015-05-27 ENCOUNTER — Ambulatory Visit: Payer: Medicare Other | Attending: Family Medicine | Admitting: Family Medicine

## 2015-05-27 ENCOUNTER — Encounter: Payer: Self-pay | Admitting: Family Medicine

## 2015-05-27 VITALS — BP 112/78 | HR 101 | Temp 97.8°F | Ht 69.0 in | Wt 150.2 lb

## 2015-05-27 DIAGNOSIS — E1165 Type 2 diabetes mellitus with hyperglycemia: Secondary | ICD-10-CM | POA: Diagnosis not present

## 2015-05-27 DIAGNOSIS — I10 Essential (primary) hypertension: Secondary | ICD-10-CM | POA: Diagnosis not present

## 2015-05-27 DIAGNOSIS — L97522 Non-pressure chronic ulcer of other part of left foot with fat layer exposed: Secondary | ICD-10-CM | POA: Insufficient documentation

## 2015-05-27 DIAGNOSIS — B353 Tinea pedis: Secondary | ICD-10-CM | POA: Insufficient documentation

## 2015-05-27 DIAGNOSIS — I639 Cerebral infarction, unspecified: Secondary | ICD-10-CM | POA: Diagnosis not present

## 2015-05-27 DIAGNOSIS — Z720 Tobacco use: Secondary | ICD-10-CM | POA: Insufficient documentation

## 2015-05-27 DIAGNOSIS — I5022 Chronic systolic (congestive) heart failure: Secondary | ICD-10-CM | POA: Insufficient documentation

## 2015-05-27 DIAGNOSIS — E1139 Type 2 diabetes mellitus with other diabetic ophthalmic complication: Secondary | ICD-10-CM | POA: Insufficient documentation

## 2015-05-27 LAB — BASIC METABOLIC PANEL
BUN: 7 mg/dL (ref 6–23)
CHLORIDE: 97 meq/L (ref 96–112)
CO2: 30 mEq/L (ref 19–32)
Calcium: 9 mg/dL (ref 8.4–10.5)
Creat: 1.03 mg/dL (ref 0.50–1.35)
GLUCOSE: 150 mg/dL — AB (ref 70–99)
POTASSIUM: 3.7 meq/L (ref 3.5–5.3)
SODIUM: 139 meq/L (ref 135–145)

## 2015-05-27 LAB — GLUCOSE, POCT (MANUAL RESULT ENTRY): POC GLUCOSE: 193 mg/dL — AB (ref 70–99)

## 2015-05-27 NOTE — Progress Notes (Signed)
Subjective:    Patient ID: Russell Hamilton, male    DOB: 09/10/1941, 74 y.o.   MRN: [id number]  HPI Russell Hamilton 74 year old male with medical history notable for uncontrolled Type 2 DM (A1c 11.5 from 05/2014), diabetic foot ulcer, CHF, CAD (who has declined cardiac cath), non compliance with medical management, COPD, dizziness secondary to hypotension, vertigo who has been seeing me for medication management. All his antihypertensives have been on hold for the last 2 weeks resulting resolution of his dizziness but he continues to take Lasix for his CHF and pedal edema. He continues to have some problem with administering his medications due to poor vision and memory issues but has persistently refused home health nursing due to the fact that he does not want anyone in his house.    Interval history: Denies  Dizziness. Complains about blurry vision and has to hold things close to his face to see them. He was referred to ophthalmology on 05/06/15 but was unable to go because he had no insurance at the time but right now he does have Medicare. He has stopped wearing his closed shoes and now with his flip-flops and has noticed his toes as well as swollen and he is able to move them but he still has sores on the dorsum of his feet. He has been using the Lamisil but does not like cleaning his feet because they smell. He admits to me that he went for a foot massage and had heat apply to his feet but he could not feel the temperature due to his peripheral neuropathy and when he got home noticed he had blisters on his feet.  Past Medical History  Diagnosis Date  . Hypertension   . Chronic systolic CHF (congestive heart failure)     a. 2D ECHO 08/13/14: EF 40%, diffuse hypokinesis possibly worse in the inferior wall. Mild MR, mild LA dilation, PA pressure 50. Trivial pericardial effusion.  . Acute systolic CHF (congestive heart failure), NYHA class 4 08/12/2014  . Type II diabetes mellitus   . GERD  (gastroesophageal reflux disease)   . Peripheral neuropathy     [name] 08/12/2014  . Stroke 2010    "memory problems since; right leg and left arm weakness since" (02/08/2015)  . Ulcers of both lower extremities     [name] 02/24/2015  . Noncompliance     [name] 02/24/2015  . COPD (chronic obstructive pulmonary disease)    Past Surgical History  Procedure Laterality Date  . Carotid endarterectomy Right 2010  . Hand ligament reconstruction Right ?1970's    "pinky"    History   Social History  . Marital Status: Married    Spouse Name: N/A  . Number of Children: N/A  . Years of Education: N/A   Occupational History  . Not on file.   Social History Main Topics  . Smoking status: Current Every Day Smoker -- 0.50 packs/day for 56 years    Types: Cigarettes  . Smokeless tobacco: Never Used  . Alcohol Use: No     Comment: 02/08/2015 "I might have a beer q 6 months"  . Drug Use: Yes    Special: Marijuana     Comment: last marijuana use 05/22/15  . Sexual Activity: Not Currently   Other Topics Concern  . Not on file   Social History Narrative    No Known Allergies  Current Outpatient Prescriptions on File Prior to Visit  Medication Sig Dispense Refill  . acetaminophen-codeine (TYLENOL #3) 300-30  MG per tablet Take 1 tablet by mouth every 8 (eight) hours as needed for moderate pain. 30 tablet 0  . aspirin 81 MG chewable tablet Chew 1 tablet (81 mg total) by mouth daily. 30 tablet 3  . atorvastatin (LIPITOR) 80 MG tablet Take 1 tablet (80 mg total) by mouth daily at 6 PM. 30 tablet 0  . clotrimazole (LOTRIMIN) 1 % cream Apply 1 application topically 2 (two) times daily. 45 g 1  . furosemide (LASIX) 40 MG tablet Take 1 tablet (40 mg total) by mouth daily. 30 tablet 1  . gabapentin (NEURONTIN) 300 MG capsule Take 2 capsules (600 mg total) by mouth 3 (three) times daily. 90 capsule 0  . glimepiride (AMARYL) 4 MG tablet Take 1 tablet (4 mg total) by mouth daily with breakfast. 30  tablet 2  . terbinafine (LAMISIL AT) 1 % cream Apply 1 application topically 2 (two) times daily. 30 g 0  . collagenase (SANTYL) ointment Apply topically daily. (Patient not taking: Reported on 05/27/2015) 15 g 0  . ferrous sulfate 325 (65 FE) MG tablet Take 1 tablet (325 mg total) by mouth 2 (two) times daily with a meal. (Patient not taking: Reported on 05/20/2015) 30 tablet 0  . hydrALAZINE (APRESOLINE) 10 MG tablet Take 1 tablet (10 mg total) by mouth 3 (three) times daily. (Patient not taking: Reported on 05/27/2015) 90 tablet 0  . isosorbide mononitrate (IMDUR) 30 MG 24 hr tablet Take 1 tablet (30 mg total) by mouth daily. (Patient not taking: Reported on 05/27/2015) 30 tablet 2  . lisinopril (PRINIVIL,ZESTRIL) 2.5 MG tablet Take 1 tablet (2.5 mg total) by mouth daily. (Patient not taking: Reported on 05/27/2015) 30 tablet 2  . meclizine (ANTIVERT) 25 MG tablet Take 1 tablet (25 mg total) by mouth 3 (three) times daily. (Patient not taking: Reported on 05/27/2015) 30 tablet 0  . pantoprazole (PROTONIX) 40 MG tablet Take 1 tablet (40 mg total) by mouth daily. (Patient not taking: Reported on 05/27/2015) 30 tablet 0  . potassium chloride SA (K-DUR,KLOR-CON) 20 MEQ tablet Take 1 tablet (20 mEq total) by mouth daily. (Patient not taking: Reported on 05/27/2015) 30 tablet 0  . sitaGLIPtin (JANUVIA) 50 MG tablet Take 1 tablet (50 mg total) by mouth daily. For diabetes (Patient not taking: Reported on 05/27/2015) 30 tablet 3   No current facility-administered medications on file prior to visit.     Review of Systems  General: negative for fever, weight loss, appetite change Eyes: blurry vision ENT: no ear symptoms, no sinus tenderness, no nasal congestion or sore throat. Neck: no pain  Respiratory: no wheezing, shortness of breath, cough Cardiovascular: no chest pain, no dyspnea on exertion, no pedal edema, no orthopnea. Gastrointestinal: no abdominal pain, no diarrhea, no constipation Genito-Urinary:  no urinary frequency, no dysuria, no polyuria. Hematologic: no bruising Endocrine: no cold or heat intolerance Neurological: no headaches, no seizures, no tremors Musculoskeletal: no joint pains, no joint swelling Skin: see hpi Psychological: no depression, no anxiety,       Objective: Filed Vitals:   05/27/15 1417  BP: 112/78  Pulse: 101  Temp: 97.8 F (36.6 C)  Height:  (1.753 m)  Weight: 150 lb 3.2 oz (68.13 kg)  SpO2: 98%      Physical Exam  Constitutional: normal appearing,  Eyes: visual acuity 20/200 HEENT: Head is atraumatic, normal sinuses, normal oropharynx, normal appearing tonsils and palate, tympanic membrane is normal bilaterally. Neck: normal range of motion, no thyromegaly, no JVD Cardiovascular:  mildly tachycardic rate, normal rhythm, normal heart sounds, no murmurs, rub or gallop, unable to palpate dorsalis pedis due to pedal edema. Respiratory: clear to auscultation bilaterally, no wheezes, no rales, no rhonchi Abdomen: soft, not tender to palpation, normal bowel sounds, no enlarged organs Extremities: Bilateral nonpitting pedal edema, dorsum is not tender to touch. Skin: Multiple sores on the dorsum of the feet some have dried and of the foot with pain with serous discharge. Foul-smelling order in between toes. Neurological: alert, oriented x3, cranial nerves I-XII grossly intact , normal motor strength, normal sensation. Psychological: normal mood.          Assessment & Plan:  74 year old male with medical history notable for uncontrolled Type 2 DM (A1c 11.5 from 05/2014), diabetic foot ulcer, CHF, CAD (who has declined cardiac cath), non compliance with medical management, COPD, dizziness secondary to hypotension here for follow-up visit.  Hypertension: Controlled All antihypertensives currently on hold due to hypotension and dizziness He remains on Lasix. 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 Due to his visual symptoms that do not think he will be a good candidate for insulin and also given he is refusing home health services. Metformin contraindicated due to CHF; he was placed on Januvia place PCP which he is not taking and I will resume that at higher dose if his renal function comes back normal. Visual acuity is 20/100. He was previously referred to ophthalmology on 05/06/15 at which time he did not have any insurance and so now I have sent her for a message for referral again as he does have insurance now.  Tinea pedis: Continue Lamisil.  Left foot ulcer: Advised to wear loose fitting shoes; keep appointment with podiatry Dressing change with dry gauze performed in the clinic. Tylenol #3 for pain.  Congestive heart failure: Ejection fraction of 30-35% from 2-D echo of 02/2015 No evidence of acute decompensation at this time. Continue Lasix.  This note has been created with Education officer, environmental. Any transcriptional errors are unintentional.

## 2015-05-27 NOTE — Progress Notes (Signed)
Patient here for follow uHe rates pain in feet 3/10 aching in nature Lamisil is helping He is not taking his bp medications his pressure is 112/78 and pulse is 101 today CBG 199-patient just ate Tylenol #3 is helping his pain

## 2015-05-27 NOTE — Progress Notes (Signed)
Quick Note:  Labs addressed at office as well as medications and patient was made aware. ______ 

## 2015-05-31 ENCOUNTER — Telehealth: Payer: Self-pay

## 2015-05-31 NOTE — Telephone Encounter (Signed)
Called the patient to check on his status. He said that he is feeling " fine" No shortness of breath reported. He noted that she is not feeling dizzy anymore and has not had any falls. He said that he continues to do the wound care to his left foot but it is hard to see the wound; yet  he continues to refuse home care services to monitor the wound. He said that the swelling is "almost gone"  in his right foot but he continues to have some left foot swelling.He noted that he also has been using the lamasil for his athlete's foot.  He reported that he has an appointment at the wound clinic on Mon, 06/06/15 and he will take a cab.  He said that he is not interested in public transportation or transportation for seniors.  He reports compliance w/ taking his medications; but noted that he will need a refill for gabapentin and tylenol /codeinie.  CM also instructed him to call [facility] if he thinks that he needs to be seen by the MD prior to his visit on 06/24/15 and he verbalized understanding.   Message sent to Dr Venetia Night requesting refills for the gabapentin and tylenol/codeine.   Called the patient back to inform him to call the pharmacy to make sure the medications are ready before coming to pick them up.

## 2015-06-01 ENCOUNTER — Telehealth: Payer: Self-pay | Admitting: Family Medicine

## 2015-06-01 ENCOUNTER — Other Ambulatory Visit: Payer: Self-pay | Admitting: Family Medicine

## 2015-06-01 DIAGNOSIS — G629 Polyneuropathy, unspecified: Secondary | ICD-10-CM

## 2015-06-01 MED ORDER — GABAPENTIN 300 MG PO CAPS: 600.0000 mg | ORAL_CAPSULE | Freq: Three times a day (TID) | ORAL | Status: DC

## 2015-06-01 MED ORDER — ACETAMINOPHEN-CODEINE #3 300-30 MG PO TABS: 1.0000 | ORAL_TABLET | Freq: Three times a day (TID) | ORAL | Status: DC | PRN

## 2015-06-01 NOTE — Telephone Encounter (Signed)
Called patient. Patient verified name and date of birth. Patient notified that his labs are normal. Patient voiced understanding. Patient asked if his gabapentin rx and tylenol #3 rx refill were ready. Patient notified that his rx for gabapentin has been placed and sent the pharmacy here at the clinic and that his rx for tylenol #3 is ready for him to pick up. Patient voiced understanding.

## 2015-06-01 NOTE — Telephone Encounter (Signed)
-----   Message from Tandy Gaw, RN sent at 06/01/2015  8:42 AM EDT -----   ----- Message -----    From: Jaclyn Shaggy, MD    Sent: 05/28/2015  10:41 PM      To: Tandy Gaw, RN  Please inform the patient that labs are normal. Thank you.

## 2015-06-01 NOTE — Telephone Encounter (Signed)
Called patient. Reached voicemail. Left message stating return call at [phone].

## 2015-06-07 ENCOUNTER — Other Ambulatory Visit: Payer: Self-pay | Admitting: Internal Medicine

## 2015-06-07 ENCOUNTER — Encounter (HOSPITAL_BASED_OUTPATIENT_CLINIC_OR_DEPARTMENT_OTHER): Payer: Medicare Other | Attending: General Surgery

## 2015-06-07 DIAGNOSIS — I509 Heart failure, unspecified: Secondary | ICD-10-CM | POA: Diagnosis not present

## 2015-06-07 DIAGNOSIS — E11621 Type 2 diabetes mellitus with foot ulcer: Secondary | ICD-10-CM | POA: Insufficient documentation

## 2015-06-07 DIAGNOSIS — I10 Essential (primary) hypertension: Secondary | ICD-10-CM | POA: Diagnosis not present

## 2015-06-07 DIAGNOSIS — I739 Peripheral vascular disease, unspecified: Secondary | ICD-10-CM | POA: Diagnosis not present

## 2015-06-07 DIAGNOSIS — E1141 Type 2 diabetes mellitus with diabetic mononeuropathy: Secondary | ICD-10-CM | POA: Insufficient documentation

## 2015-06-07 DIAGNOSIS — J449 Chronic obstructive pulmonary disease, unspecified: Secondary | ICD-10-CM | POA: Insufficient documentation

## 2015-06-07 DIAGNOSIS — I252 Old myocardial infarction: Secondary | ICD-10-CM | POA: Diagnosis not present

## 2015-06-07 DIAGNOSIS — F0390 Unspecified dementia without behavioral disturbance: Secondary | ICD-10-CM | POA: Diagnosis not present

## 2015-06-07 DIAGNOSIS — L97529 Non-pressure chronic ulcer of other part of left foot with unspecified severity: Secondary | ICD-10-CM | POA: Diagnosis present

## 2015-06-12 ENCOUNTER — Emergency Department (HOSPITAL_COMMUNITY): Payer: Medicare Other

## 2015-06-12 ENCOUNTER — Inpatient Hospital Stay (HOSPITAL_COMMUNITY)
Admission: EM | Admit: 2015-06-12 | Discharge: 2015-06-14 | DRG: 291 | Disposition: A | Discharge status: Home / Self Care | Payer: Medicare Other | Attending: Internal Medicine | Admitting: Internal Medicine

## 2015-06-12 ENCOUNTER — Encounter (HOSPITAL_COMMUNITY): Payer: Self-pay | Admitting: *Deleted

## 2015-06-12 DIAGNOSIS — I5023 Acute on chronic systolic (congestive) heart failure: Secondary | ICD-10-CM

## 2015-06-12 DIAGNOSIS — F17210 Nicotine dependence, cigarettes, uncomplicated: Secondary | ICD-10-CM | POA: Diagnosis present

## 2015-06-12 DIAGNOSIS — Z7982 Long term (current) use of aspirin: Secondary | ICD-10-CM

## 2015-06-12 DIAGNOSIS — J9601 Acute respiratory failure with hypoxia: Secondary | ICD-10-CM | POA: Diagnosis not present

## 2015-06-12 DIAGNOSIS — N183 Chronic kidney disease, stage 3 unspecified: Secondary | ICD-10-CM | POA: Diagnosis present

## 2015-06-12 DIAGNOSIS — D509 Iron deficiency anemia, unspecified: Secondary | ICD-10-CM | POA: Diagnosis present

## 2015-06-12 DIAGNOSIS — Z66 Do not resuscitate: Secondary | ICD-10-CM | POA: Diagnosis present

## 2015-06-12 DIAGNOSIS — I5043 Acute on chronic combined systolic (congestive) and diastolic (congestive) heart failure: Principal | ICD-10-CM | POA: Diagnosis present

## 2015-06-12 DIAGNOSIS — R0602 Shortness of breath: Secondary | ICD-10-CM | POA: Diagnosis not present

## 2015-06-12 DIAGNOSIS — L84 Corns and callosities: Secondary | ICD-10-CM | POA: Diagnosis present

## 2015-06-12 DIAGNOSIS — E1122 Type 2 diabetes mellitus with diabetic chronic kidney disease: Secondary | ICD-10-CM | POA: Diagnosis present

## 2015-06-12 DIAGNOSIS — L97529 Non-pressure chronic ulcer of other part of left foot with unspecified severity: Secondary | ICD-10-CM | POA: Diagnosis present

## 2015-06-12 DIAGNOSIS — E1142 Type 2 diabetes mellitus with diabetic polyneuropathy: Secondary | ICD-10-CM | POA: Diagnosis present

## 2015-06-12 DIAGNOSIS — Z9119 Patient's noncompliance with other medical treatment and regimen: Secondary | ICD-10-CM | POA: Diagnosis present

## 2015-06-12 DIAGNOSIS — L97522 Non-pressure chronic ulcer of other part of left foot with fat layer exposed: Secondary | ICD-10-CM

## 2015-06-12 DIAGNOSIS — I2510 Atherosclerotic heart disease of native coronary artery without angina pectoris: Secondary | ICD-10-CM | POA: Diagnosis present

## 2015-06-12 DIAGNOSIS — Z79899 Other long term (current) drug therapy: Secondary | ICD-10-CM

## 2015-06-12 DIAGNOSIS — K219 Gastro-esophageal reflux disease without esophagitis: Secondary | ICD-10-CM | POA: Diagnosis present

## 2015-06-12 DIAGNOSIS — I129 Hypertensive chronic kidney disease with stage 1 through stage 4 chronic kidney disease, or unspecified chronic kidney disease: Secondary | ICD-10-CM | POA: Diagnosis present

## 2015-06-12 DIAGNOSIS — L97509 Non-pressure chronic ulcer of other part of unspecified foot with unspecified severity: Secondary | ICD-10-CM | POA: Diagnosis present

## 2015-06-12 DIAGNOSIS — I10 Essential (primary) hypertension: Secondary | ICD-10-CM | POA: Diagnosis present

## 2015-06-12 DIAGNOSIS — E1165 Type 2 diabetes mellitus with hyperglycemia: Secondary | ICD-10-CM | POA: Diagnosis present

## 2015-06-12 LAB — CBC
HEMATOCRIT: 40.8 % (ref 39.0–52.0)
Hemoglobin: 13.3 g/dL (ref 13.0–17.0)
MCH: 30.5 pg (ref 26.0–34.0)
MCHC: 32.6 g/dL (ref 30.0–36.0)
MCV: 93.6 fL (ref 78.0–100.0)
Platelets: 158 10*3/uL (ref 150–400)
RBC: 4.36 MIL/uL (ref 4.22–5.81)
RDW: 16.1 % — ABNORMAL HIGH (ref 11.5–15.5)
WBC: 4.8 10*3/uL (ref 4.0–10.5)

## 2015-06-12 LAB — CBC WITH DIFFERENTIAL/PLATELET
Basophils Absolute: 0 10*3/uL (ref 0.0–0.1)
Basophils Relative: 0 % (ref 0–1)
EOS ABS: 0 10*3/uL (ref 0.0–0.7)
Eosinophils Relative: 0 % (ref 0–5)
HEMATOCRIT: 36.7 % — AB (ref 39.0–52.0)
Hemoglobin: 12 g/dL — ABNORMAL LOW (ref 13.0–17.0)
Lymphocytes Relative: 23 % (ref 12–46)
Lymphs Abs: 1.2 10*3/uL (ref 0.7–4.0)
MCH: 30.5 pg (ref 26.0–34.0)
MCHC: 32.7 g/dL (ref 30.0–36.0)
MCV: 93.1 fL (ref 78.0–100.0)
MONO ABS: 1 10*3/uL (ref 0.1–1.0)
Monocytes Relative: 20 % — ABNORMAL HIGH (ref 3–12)
NEUTROS ABS: 2.9 10*3/uL (ref 1.7–7.7)
Neutrophils Relative %: 57 % (ref 43–77)
Platelets: 140 10*3/uL — ABNORMAL LOW (ref 150–400)
RBC: 3.94 MIL/uL — ABNORMAL LOW (ref 4.22–5.81)
RDW: 16 % — AB (ref 11.5–15.5)
WBC: 5.2 10*3/uL (ref 4.0–10.5)

## 2015-06-12 LAB — BRAIN NATRIURETIC PEPTIDE: B NATRIURETIC PEPTIDE 5: 1498.8 pg/mL — AB (ref 0.0–100.0)

## 2015-06-12 LAB — TSH: TSH: 0.707 u[IU]/mL (ref 0.350–4.500)

## 2015-06-12 LAB — BASIC METABOLIC PANEL
Anion gap: 9 (ref 5–15)
BUN: 7 mg/dL (ref 6–20)
CHLORIDE: 105 mmol/L (ref 101–111)
CO2: 23 mmol/L (ref 22–32)
Calcium: 8.6 mg/dL — ABNORMAL LOW (ref 8.9–10.3)
Creatinine, Ser: 1.13 mg/dL (ref 0.61–1.24)
GFR calc Af Amer: >60 mL/min (ref >60)
GFR calc non Af Amer: >60 mL/min (ref >60)
Glucose, Bld: 167 mg/dL — ABNORMAL HIGH (ref 65–99)
Potassium: 4.3 mmol/L (ref 3.5–5.1)
Sodium: 137 mmol/L (ref 135–145)

## 2015-06-12 LAB — I-STAT TROPONIN, ED: Troponin i, poc: 0.04 ng/mL (ref 0.00–0.08)

## 2015-06-12 LAB — TROPONIN I: Troponin I: 0.06 ng/mL — ABNORMAL HIGH (ref <0.031)

## 2015-06-12 LAB — CREATININE, SERUM
Creatinine, Ser: 1.13 mg/dL (ref 0.61–1.24)
GFR calc non Af Amer: >60 mL/min (ref >60)

## 2015-06-12 LAB — GLUCOSE, CAPILLARY: GLUCOSE-CAPILLARY: 163 mg/dL — AB (ref 65–99)

## 2015-06-12 MED ORDER — SODIUM CHLORIDE 0.9 % IJ SOLN: 3.0000 mL | INTRAMUSCULAR | Status: DC | PRN

## 2015-06-12 MED ORDER — GABAPENTIN 300 MG PO CAPS
600.0000 mg | ORAL_CAPSULE | Freq: Three times a day (TID) | ORAL | Status: DC
  Administered 2015-06-12 – 2015-06-13 (×5): 600 mg via ORAL
  Filled 2015-06-12 (×8): qty 2

## 2015-06-12 MED ORDER — ACETAMINOPHEN-CODEINE #3 300-30 MG PO TABS
1.0000 | ORAL_TABLET | Freq: Three times a day (TID) | ORAL | Status: DC | PRN
Start: 2015-06-12 — End: 2015-06-14

## 2015-06-12 MED ORDER — FUROSEMIDE 10 MG/ML IJ SOLN
80.0000 mg | Freq: Two times a day (BID) | INTRAMUSCULAR | Status: DC
  Administered 2015-06-13 – 2015-06-14 (×3): 80 mg via INTRAVENOUS
  Filled 2015-06-12 (×4): qty 8

## 2015-06-12 MED ORDER — FERROUS SULFATE 325 (65 FE) MG PO TABS
325.0000 mg | ORAL_TABLET | Freq: Two times a day (BID) | ORAL | Status: DC
  Administered 2015-06-12 – 2015-06-14 (×4): 325 mg via ORAL
  Filled 2015-06-12 (×6): qty 1

## 2015-06-12 MED ORDER — METOPROLOL TARTRATE 12.5 MG HALF TABLET
12.5000 mg | ORAL_TABLET | Freq: Two times a day (BID) | ORAL | Status: DC
Start: 2015-06-12 — End: 2015-06-14
  Administered 2015-06-12 – 2015-06-14 (×4): 12.5 mg via ORAL
  Filled 2015-06-12 (×5): qty 1

## 2015-06-12 MED ORDER — FUROSEMIDE 10 MG/ML IJ SOLN
80.0000 mg | Freq: Once | INTRAMUSCULAR | Status: AC
  Administered 2015-06-12: 80 mg via INTRAVENOUS
  Filled 2015-06-12: qty 8

## 2015-06-12 MED ORDER — POTASSIUM CHLORIDE CRYS ER 20 MEQ PO TBCR
20.0000 meq | EXTENDED_RELEASE_TABLET | Freq: Every day | ORAL | Status: DC
  Administered 2015-06-12 – 2015-06-13 (×2): 20 meq via ORAL
  Filled 2015-06-12 (×3): qty 1

## 2015-06-12 MED ORDER — ONDANSETRON HCL 4 MG/2ML IJ SOLN: 4.0000 mg | Freq: Four times a day (QID) | INTRAMUSCULAR | Status: DC | PRN

## 2015-06-12 MED ORDER — ACETAMINOPHEN 325 MG PO TABS: 650.0000 mg | ORAL_TABLET | ORAL | Status: DC | PRN

## 2015-06-12 MED ORDER — FUROSEMIDE 10 MG/ML IJ SOLN
80.0000 mg | Freq: Two times a day (BID) | INTRAMUSCULAR | Status: DC
  Administered 2015-06-12: 80 mg via INTRAVENOUS
  Filled 2015-06-12: qty 8

## 2015-06-12 MED ORDER — INSULIN ASPART 100 UNIT/ML ~~LOC~~ SOLN: 0.0000 [IU] | Freq: Every day | SUBCUTANEOUS | Status: DC

## 2015-06-12 MED ORDER — INSULIN ASPART 100 UNIT/ML ~~LOC~~ SOLN
0.0000 [IU] | Freq: Three times a day (TID) | SUBCUTANEOUS | Status: DC
  Administered 2015-06-13: 1 [IU] via SUBCUTANEOUS
  Administered 2015-06-13 – 2015-06-14 (×2): 2 [IU] via SUBCUTANEOUS
  Administered 2015-06-14: 1 [IU] via SUBCUTANEOUS

## 2015-06-12 MED ORDER — SODIUM CHLORIDE 0.9 % IV SOLN: 250.0000 mL | INTRAVENOUS | Status: DC | PRN

## 2015-06-12 MED ORDER — LISINOPRIL 2.5 MG PO TABS
2.5000 mg | ORAL_TABLET | Freq: Every day | ORAL | Status: DC
Start: 2015-06-12 — End: 2015-06-14
  Administered 2015-06-12 – 2015-06-14 (×3): 2.5 mg via ORAL
  Filled 2015-06-12 (×3): qty 1

## 2015-06-12 MED ORDER — SODIUM CHLORIDE 0.9 % IJ SOLN
3.0000 mL | Freq: Two times a day (BID) | INTRAMUSCULAR | Status: DC
  Administered 2015-06-12 – 2015-06-14 (×4): 3 mL via INTRAVENOUS

## 2015-06-12 MED ORDER — TERBINAFINE HCL 1 % EX CREA
1.0000 "application " | TOPICAL_CREAM | Freq: Two times a day (BID) | CUTANEOUS | Status: DC
  Administered 2015-06-12 – 2015-06-14 (×4): 1 via TOPICAL
  Filled 2015-06-12: qty 12

## 2015-06-12 MED ORDER — ISOSORBIDE MONONITRATE ER 30 MG PO TB24
30.0000 mg | ORAL_TABLET | Freq: Every day | ORAL | Status: DC
  Administered 2015-06-12 – 2015-06-14 (×3): 30 mg via ORAL
  Filled 2015-06-12 (×3): qty 1

## 2015-06-12 MED ORDER — ENOXAPARIN SODIUM 40 MG/0.4ML ~~LOC~~ SOLN
40.0000 mg | SUBCUTANEOUS | Status: DC
  Filled 2015-06-12 (×3): qty 0.4

## 2015-06-12 MED ORDER — ASPIRIN 81 MG PO CHEW
81.0000 mg | CHEWABLE_TABLET | Freq: Every day | ORAL | Status: DC
  Administered 2015-06-12 – 2015-06-14 (×3): 81 mg via ORAL
  Filled 2015-06-12 (×3): qty 1

## 2015-06-12 NOTE — ED Provider Notes (Signed)
CSN: [id number]     Arrival date & time 06/12/15  1144 History   First MD Initiated Contact with Patient 06/12/15 1151     Chief Complaint  Patient presents with  . Shortness of Breath     (Consider location/radiation/quality/duration/timing/severity/associated sxs/prior Treatment) Patient is a 74 y.o. male presenting with shortness of breath. The history is provided by the patient.  Shortness of Breath Severity:  Moderate Onset quality:  Gradual Duration:  2 days Timing:  Constant Progression:  Worsening Chronicity:  New Context comment:  Laying flat, recent decrese in home lasix Relieved by:  Nothing Worsened by:  Nothing tried Ineffective treatments:  Diuretics Associated symptoms: no abdominal pain, no chest pain and no fever     Past Medical History  Diagnosis Date  . Hypertension   . Chronic systolic CHF (congestive heart failure)     a. 2D ECHO 08/13/14: EF 40%, diffuse hypokinesis possibly worse in the inferior wall. Mild MR, mild LA dilation, PA pressure 50. Trivial pericardial effusion.  . Acute systolic CHF (congestive heart failure), NYHA class 4 08/12/2014  . Type II diabetes mellitus   . GERD (gastroesophageal reflux disease)   . Peripheral neuropathy     [name] 08/12/2014  . Stroke 2010    "memory problems since; right leg and left arm weakness since" (02/08/2015)  . Ulcers of both lower extremities     [name] 02/24/2015  . Noncompliance     [name] 02/24/2015  . COPD (chronic obstructive pulmonary disease)    Past Surgical History  Procedure Laterality Date  . Carotid endarterectomy Right 2010  . Hand ligament reconstruction Right ?1970's    "pinky"   Family History  Problem Relation Age of Onset  . Kidney disease Mother    History  Substance Use Topics  . Smoking status: Current Every Day Smoker -- 0.50 packs/day for 56 years    Types: Cigarettes  . Smokeless tobacco: Never Used  . Alcohol Use: No     Comment: 02/08/2015 "I might have a beer q 6  months"    Review of Systems  Constitutional: Negative for fever.  Respiratory: Positive for shortness of breath.   Cardiovascular: Negative for chest pain.  Gastrointestinal: Negative for abdominal pain.  All other systems reviewed and are negative.     Allergies  Review of patient's allergies indicates no known allergies.  Home Medications   Prior to Admission medications   Medication Sig Start Date End Date Taking? Authorizing Provider  acetaminophen-codeine (TYLENOL #3) 300-30 MG per tablet Take 1 tablet by mouth every 8 (eight) hours as needed for moderate pain. 06/01/15  Yes Jaclyn Shaggy, MD  gabapentin (NEURONTIN) 300 MG capsule Take 2 capsules (600 mg total) by mouth 3 (three) times daily. 06/01/15  Yes Jaclyn Shaggy, MD  aspirin 81 MG chewable tablet Chew 1 tablet (81 mg total) by mouth daily. 02/10/15   Shanker Levora Dredge, MD  atorvastatin (LIPITOR) 80 MG tablet Take 1 tablet (80 mg total) by mouth daily at 6 PM. 02/28/15   Renae Fickle, MD  clotrimazole (LOTRIMIN) 1 % cream Apply 1 application topically 2 (two) times daily. 04/26/15   Jaclyn Shaggy, MD  collagenase (SANTYL) ointment Apply topically daily. Patient not taking: Reported on 05/27/2015 02/10/15   Maretta Bees, MD  ferrous sulfate 325 (65 FE) MG tablet Take 1 tablet (325 mg total) by mouth 2 (two) times daily with a meal. Patient not taking: Reported on 05/20/2015 02/28/15   Renae Fickle, MD  furosemide (  LASIX) 40 MG tablet Take 1 tablet (40 mg total) by mouth daily. 05/20/15   Jaclyn Shaggy, MD  glimepiride (AMARYL) 4 MG tablet Take 1 tablet (4 mg total) by mouth daily with breakfast. 04/12/15   Jaclyn Shaggy, MD  hydrALAZINE (APRESOLINE) 10 MG tablet Take 1 tablet (10 mg total) by mouth 3 (three) times daily. Patient not taking: Reported on 05/27/2015 02/28/15   Renae Fickle, MD  isosorbide mononitrate (IMDUR) 30 MG 24 hr tablet Take 1 tablet (30 mg total) by mouth daily. Patient not taking: Reported on 05/27/2015  04/19/15   Jaclyn Shaggy, MD  lisinopril (PRINIVIL,ZESTRIL) 2.5 MG tablet Take 1 tablet (2.5 mg total) by mouth daily. Patient not taking: Reported on 05/27/2015 04/19/15   Jaclyn Shaggy, MD  meclizine (ANTIVERT) 25 MG tablet Take 1 tablet (25 mg total) by mouth 3 (three) times daily. Patient not taking: Reported on 05/27/2015 05/11/15   Clydia Llano, MD  pantoprazole (PROTONIX) 40 MG tablet Take 1 tablet (40 mg total) by mouth daily. Patient not taking: Reported on 05/27/2015 02/10/15   Maretta Bees, MD  potassium chloride SA (K-DUR,KLOR-CON) 20 MEQ tablet Take 1 tablet (20 mEq total) by mouth daily. Patient not taking: Reported on 05/27/2015 03/01/15   Renae Fickle, MD  sitaGLIPtin (JANUVIA) 50 MG tablet Take 1 tablet (50 mg total) by mouth daily. For diabetes Patient not taking: Reported on 05/27/2015 05/06/15   Ambrose Finland, NP  terbinafine (LAMISIL AT) 1 % cream Apply 1 application topically 2 (two) times daily. 05/18/15   Jaclyn Shaggy, MD   BP 127/81 mmHg  Pulse 64  Temp(Src) 98.2 F (36.8 C) (Oral)  Resp 33  Ht 5' 9.5" (1.765 m)  Wt 150 lb 3.2 oz (68.13 kg)  BMI 21.87 kg/m2  SpO2 97% Physical Exam  Constitutional: He is oriented to person, place, and time. He appears well-developed and well-nourished. No distress.  HENT:  Head: Normocephalic and atraumatic.  Eyes: Conjunctivae are normal.  Neck: Neck supple. No tracheal deviation present.  Cardiovascular: Normal rate, regular rhythm and normal heart sounds.   Pulmonary/Chest: Effort normal. No respiratory distress. He has rales (bilateral diffuse).  Abdominal: Soft. He exhibits no distension. There is no tenderness.  Neurological: He is alert and oriented to person, place, and time.  Skin: Skin is warm and dry.  Psychiatric: He has a normal mood and affect.    ED Course  Procedures (including critical care time) Labs Review Labs Reviewed  BASIC METABOLIC PANEL - Abnormal; Notable for the following:    Glucose, Bld 167 (*)     Calcium 8.6 (*)    All other components within normal limits  BRAIN NATRIURETIC PEPTIDE - Abnormal; Notable for the following:    B Natriuretic Peptide 1498.8 (*)    All other components within normal limits  CBC WITH DIFFERENTIAL/PLATELET - Abnormal; Notable for the following:    RBC 3.94 (*)    Hemoglobin 12.0 (*)    HCT 36.7 (*)    RDW 16.0 (*)    Platelets 140 (*)    Monocytes Relative 20 (*)    All other components within normal limits  TROPONIN I - Abnormal; Notable for the following:    Troponin I 0.06 (*)    All other components within normal limits  CBC - Abnormal; Notable for the following:    RDW 16.1 (*)    All other components within normal limits  GLUCOSE, CAPILLARY - Abnormal; Notable for the following:    Glucose-Capillary  163 (*)    All other components within normal limits  TSH  CREATININE, SERUM  TROPONIN I  BASIC METABOLIC PANEL  I-STAT TROPOININ, ED    Imaging Review Dg Chest 2 View  06/12/2015   CLINICAL DATA:  Shortness of breast since last night, hypertension, diabetes mellitus, CHF, GERD, COPD, smoker  EXAM: CHEST  2 VIEW  COMPARISON:  03/26/2015  FINDINGS: Enlargement of cardiac silhouette with pulmonary vascular congestion.  Mediastinal contours normal.  Minimal accentuation of perihilar markings consistent with mild pulmonary edema and CHF, improved since previous exams.  Bibasilar effusions and atelectasis.  No pneumothorax.  Osseous structures unremarkable.  IMPRESSION: Enlargement of cardiac silhouette with pulmonary vascular congestion and improved CHF.  Bibasilar effusions and atelectasis.   Electronically Signed   By: Ulyses Southward M.D.   On: 06/12/2015 13:12   I independently viewed and interpreted the above radiology studies and agree with radiologist report.   EKG Interpretation   Date/Time:  Sunday June 12 2015 11:46:21 EDT Ventricular Rate:  108 PR Interval:  157 QRS Duration: 111 QT Interval:  358 QTC Calculation: 480 R Axis:    -31 Text Interpretation:  Sinus tachycardia Probable left atrial enlargement  Left axis deviation Left ventricular hypertrophy Repolarization  abnormality secondary to ventricular hypertrophy No significant change  since last tracing Confirmed by Jearldine Cassady MD, Reuel Boom ([id number]) on 06/12/2015  1:16:34 PM      MDM   Final diagnoses:  Acute on chronic systolic congestive heart failure   74 yo M with h/o CHF and COPD presents with worsening positional dyspnea while lying flat. Decreased home lasx recently. Pulmonary edema evident on CXR and BNP elevated, suspect given clinical appearance that Pt is fluid overloaded and will require diuresis on an IP basis. Hospitalist was consulted for admission and will see the patient in the emergency department.     Lyndal Pulley, MD 06/12/15 2249

## 2015-06-12 NOTE — H&P (Signed)
[facility] History and Physical  Russell Hamilton ZOX:[id number] DOB: 1940-11-19 DOA: 06/12/2015  Referring physician:  Lyndal Pulley PCP:  Ambrose Finland, NP   Chief Complaint:  SOB  HPI:  74 year old male with history of chronic systolic and diastolic heart failure, hypertension, diabetes type mellitus type II, CAD based on CT imaging and declines cardiac catheterization, limited to no medical insight, medication noncompliance, 6 admissions in the last 6 months and declines home health services.  He was last hospitalized in early July with orthostatic hypotension, dizziness and was started on meclizine. He has had several follow-up appointments with his primary care doctor. According to the patient, several of his medications were discontinued including his diabetes medications, his blood pressure medications. His Lasix dose was reportedly cut from 80 mg to 40 mg once a day. After reviewing the notes, I do not see where these changes were made by his primary care doctor.  He states he had routine surgery for debridement of a wound on his left foot last week and he will need his second dressing change tomorrow.  He was feeling well but developed orthopnea and PND over the last 1-2 days.  He denies wheezing, productive cough, fevers, chills, rhinorrhea, sore throat. He denies nausea, vomiting, diarrhea. He states his lower extremity swelling is approximately stable and he denies worsening abdominal distention and chest pain. He feels that his weight is stable. After reviewing his weights over the last several months he has had large fluctuations from 61.598kg (which I believe was spurious) and was probably 65.8-kg as the low up to 84.9-kg.  His weight today is 68 kg.    In the emergency department, his vital signs were notable for mild tachycardia to the low 100s, blood pressure mild elevated 146/101, saturation 100% on room air. His labs were concerning for elevated BNP 1498, troponin 0.04.  CXR  demonstrated improved vascular congestion and chronic cardiomegaly.  He continues to have bibasilar effusions and atelectasis.  He was given Lasix 80 mg IV once in the emergency department and has had rapid diuresis. EKG demonstrated normal sinus rhythm with LVH, LAD, stable lateral T-wave inversions.  Review of Systems:  General:  Denies fevers, chills, weight loss or gain HEENT:  Denies changes to hearing and vision, rhinorrhea, sinus congestion, sore throat CV:  Denies chest pain and palpitations, lower extremity edema.  PULM:  Denies SOB, wheezing, cough.   GI:  Denies nausea, vomiting, constipation, diarrhea.   GU:  Denies dysuria, frequency, urgency ENDO:  Denies polyuria, polydipsia.   HEME:  Denies hematemesis, blood in stools, melena, abnormal bruising or bleeding.  LYMPH:  Denies lymphadenopathy.   MSK:  Denies arthralgias, myalgias.   DERM:  Denies skin rash or ulcer.   NEURO:  Denies focal numbness, weakness, slurred speech, confusion, facial droop.  PSYCH:  Denies anxiety and depression.    Past Medical History  Diagnosis Date  . Hypertension   . Chronic systolic CHF (congestive heart failure)     a. 2D ECHO 08/13/14: EF 40%, diffuse hypokinesis possibly worse in the inferior wall. Mild MR, mild LA dilation, PA pressure 50. Trivial pericardial effusion.  . Acute systolic CHF (congestive heart failure), NYHA class 4 08/12/2014  . Type II diabetes mellitus   . GERD (gastroesophageal reflux disease)   . Peripheral neuropathy     [name] 08/12/2014  . Stroke 2010    "memory problems since; right leg and left arm weakness since" (02/08/2015)  . Ulcers of both lower extremities     /  notes 02/24/2015  . Noncompliance     [name] 02/24/2015  . COPD (chronic obstructive pulmonary disease)    Past Surgical History  Procedure Laterality Date  . Carotid endarterectomy Right 2010  . Hand ligament reconstruction Right ?1970's    "pinky"   Social History:  reports that he has been  smoking Cigarettes.  He has a 28 pack-year smoking history. He has never used smokeless tobacco. He reports that he uses illicit drugs (Marijuana). He reports that he does not drink alcohol.  No Known Allergies  Family History  Problem Relation Age of Onset  . Kidney disease Mother      Prior to Admission medications   Medication Sig Start Date End Date Taking? Authorizing Provider  acetaminophen-codeine (TYLENOL #3) 300-30 MG per tablet Take 1 tablet by mouth every 8 (eight) hours as needed for moderate pain. 06/01/15  Yes Jaclyn Shaggy, MD  gabapentin (NEURONTIN) 300 MG capsule Take 2 capsules (600 mg total) by mouth 3 (three) times daily. 06/01/15  Yes Jaclyn Shaggy, MD  aspirin 81 MG chewable tablet Chew 1 tablet (81 mg total) by mouth daily. 02/10/15   Shanker Levora Dredge, MD  atorvastatin (LIPITOR) 80 MG tablet Take 1 tablet (80 mg total) by mouth daily at 6 PM. 02/28/15   Renae Fickle, MD  clotrimazole (LOTRIMIN) 1 % cream Apply 1 application topically 2 (two) times daily. 04/26/15   Jaclyn Shaggy, MD  collagenase (SANTYL) ointment Apply topically daily. Patient not taking: Reported on 05/27/2015 02/10/15   Maretta Bees, MD  ferrous sulfate 325 (65 FE) MG tablet Take 1 tablet (325 mg total) by mouth 2 (two) times daily with a meal. Patient not taking: Reported on 05/20/2015 02/28/15   Renae Fickle, MD  furosemide (LASIX) 40 MG tablet Take 1 tablet (40 mg total) by mouth daily. 05/20/15   Jaclyn Shaggy, MD  glimepiride (AMARYL) 4 MG tablet Take 1 tablet (4 mg total) by mouth daily with breakfast. 04/12/15   Jaclyn Shaggy, MD  hydrALAZINE (APRESOLINE) 10 MG tablet Take 1 tablet (10 mg total) by mouth 3 (three) times daily. Patient not taking: Reported on 05/27/2015 02/28/15   Renae Fickle, MD  isosorbide mononitrate (IMDUR) 30 MG 24 hr tablet Take 1 tablet (30 mg total) by mouth daily. Patient not taking: Reported on 05/27/2015 04/19/15   Jaclyn Shaggy, MD  lisinopril (PRINIVIL,ZESTRIL) 2.5 MG  tablet Take 1 tablet (2.5 mg total) by mouth daily. Patient not taking: Reported on 05/27/2015 04/19/15   Jaclyn Shaggy, MD  meclizine (ANTIVERT) 25 MG tablet Take 1 tablet (25 mg total) by mouth 3 (three) times daily. Patient not taking: Reported on 05/27/2015 05/11/15   Clydia Llano, MD  pantoprazole (PROTONIX) 40 MG tablet Take 1 tablet (40 mg total) by mouth daily. Patient not taking: Reported on 05/27/2015 02/10/15   Maretta Bees, MD  potassium chloride SA (K-DUR,KLOR-CON) 20 MEQ tablet Take 1 tablet (20 mEq total) by mouth daily. Patient not taking: Reported on 05/27/2015 03/01/15   Renae Fickle, MD  sitaGLIPtin (JANUVIA) 50 MG tablet Take 1 tablet (50 mg total) by mouth daily. For diabetes Patient not taking: Reported on 05/27/2015 05/06/15   Ambrose Finland, NP  terbinafine (LAMISIL AT) 1 % cream Apply 1 application topically 2 (two) times daily. 05/18/15   Jaclyn Shaggy, MD   Physical Exam: Filed Vitals:   06/12/15 1333 06/12/15 1345 06/12/15 1400 06/12/15 1415  BP: 141/86 157/104 158/103 146/101  Pulse: 105 108 103 101  Temp:  TempSrc:      Resp: 22 22 15 26   Height:      Weight:      SpO2: 99% 98% 100% 100%     General:  Adult male, NAD, nasal canula in place  Eyes:  PERRL, anicteric, non-injected.  ENT:  Nares clear.  OP clear, non-erythematous without plaques or exudates.  MMM.  Neck:  Supple without TM or JVD.    Lymph:  No cervical, supraclavicular, or submandibular LAD.  Cardiovascular:  RRR, normal S1, S2, no mrg,  2+ pulses, cool extremities  Respiratory:  Diminished at the bilateral bases with some rales at the left base, no rhonchi, no wheeze, without increased WOB.  Abdomen:  NABS.  Soft, ND/NT.    Skin:  Bandages on the left foot which are still intact, no other dressing supplies and the ribs so deferred exam at this time  Musculoskeletal:  Normal bulk and tone.  No LE edema.  Psychiatric:  A & O x 4.  Appropriate affect.  Neurologic:  CN 3-12 intact.   5/5 strength.  Sensation intact.  Labs on Admission:  Basic Metabolic Panel:  Recent Labs Lab 06/12/15 1214  NA 137  K 4.3  CL 105  CO2 23  GLUCOSE 167*  BUN 7  CREATININE 1.13  CALCIUM 8.6*   Liver Function Tests: No results for input(s): AST, ALT, ALKPHOS, BILITOT, PROT, ALBUMIN in the last 168 hours. No results for input(s): LIPASE, AMYLASE in the last 168 hours. No results for input(s): AMMONIA in the last 168 hours. CBC:  Recent Labs Lab 06/12/15 1214  WBC 5.2  NEUTROABS 2.9  HGB 12.0*  HCT 36.7*  MCV 93.1  PLT 140*   Cardiac Enzymes: No results for input(s): CKTOTAL, CKMB, CKMBINDEX, TROPONINI in the last 168 hours.  BNP (last 3 results)  Recent Labs  02/24/15 1911 03/26/15 1516 06/12/15 1214  BNP 1739.5* 1712.3* 1498.8*    ProBNP (last 3 results)  Recent Labs  08/12/14 1756  PROBNP 7427.0*    CBG: No results for input(s): GLUCAP in the last 168 hours.  Radiological Exams on Admission: Dg Chest 2 View  06/12/2015   CLINICAL DATA:  Shortness of breast since last night, hypertension, diabetes mellitus, CHF, GERD, COPD, smoker  EXAM: CHEST  2 VIEW  COMPARISON:  03/26/2015  FINDINGS: Enlargement of cardiac silhouette with pulmonary vascular congestion.  Mediastinal contours normal.  Minimal accentuation of perihilar markings consistent with mild pulmonary edema and CHF, improved since previous exams.  Bibasilar effusions and atelectasis.  No pneumothorax.  Osseous structures unremarkable.  IMPRESSION: Enlargement of cardiac silhouette with pulmonary vascular congestion and improved CHF.  Bibasilar effusions and atelectasis.   Electronically Signed   By: Ulyses Southward M.D.   On: 06/12/2015 13:12    EKG: Independently reviewed.  normal sinus rhythm with LVH, LAD, stable lateral T-wave inversions.  Assessment/Plan Active Problems:   * No active hospital problems. *  ---  Acute hypoxic respiratory failure secondary to acute on chronic systolic and  diastolic heart failure from decreased lasix dose.   Echocardiogram from April 2016 demonstrates mild LVH with severely reduced systolic function with ejection fraction of 30-35%, diffuse hypokinesis, grade 1 diastolic dysfunction, biatrial enlargement, moderate TR.   -  Continue Lasix 80 mg IV twice a day  -  Daily weights and strict I/O - start beta blocker and ACEI - noncompliant with hydralazine and imdur and lisinopril, resume as tolerated -  Patient declines telemetry -  Cycle troponin  and check TSH  CAD, patient declines heart catheterization - start beta blocker -  Continue aspirin 81 mg - declines statin  Possible COPD, stable, with ongoing smoking.    Bilateral lower extremity callouses and ulcer left foot lateral aspect - Wound careconsult  Hypertension, blood pressuremildly elevated - Continue metop -  Resume ACEI, hydralazine/imdur as tolerated  Diabetes mellitus type 2,uncontrolled with development of chronic kidney disease stage III, A1c 11.5 on 05/06/2015 -hold home medications -SSI  Iron deficiency anemia, hemoglobin at baseline of 12-13. - Continue ferrous sulfate - Recommend follow-up with gastroenterology  Noncompliance with appointments and lack of insurance, very limited and poor insight into medical conditions.  - Social work and case management following - Education with teach back about insurance, medications, and health problems  Smoking cessation - Smoking cessation counseled  Diet: Diabetic Access: PIV IVF: off Proph: scd (ordered lovenox but he usually refuses)  Code Status: DNR Family Communication: patient alone Disposition Plan: pending diuresis, wean oxygen as tolerated  Time spent: 60 min Ledger Heindl [facility] Pager [phone]  If 7PM-7AM, please contact night-coverage [email or web address] Password [email or web address] 06/12/2015, 2:54 PM

## 2015-06-12 NOTE — ED Notes (Signed)
Pt began to feel SOB last night. He called EMS b/c the SOB got worse while laying down.  Pt walked to EMS upon arrival and stated that he needed help with his SOB. He has had a non productive cough for 2 days.  VS per EMS are as follows: BP: 168/110 HR: 100 CBG:167 SPO2: 97% on 6L

## 2015-06-13 DIAGNOSIS — L84 Corns and callosities: Secondary | ICD-10-CM | POA: Diagnosis present

## 2015-06-13 DIAGNOSIS — N183 Chronic kidney disease, stage 3 (moderate): Secondary | ICD-10-CM

## 2015-06-13 DIAGNOSIS — N189 Chronic kidney disease, unspecified: Secondary | ICD-10-CM

## 2015-06-13 DIAGNOSIS — I129 Hypertensive chronic kidney disease with stage 1 through stage 4 chronic kidney disease, or unspecified chronic kidney disease: Secondary | ICD-10-CM | POA: Diagnosis present

## 2015-06-13 DIAGNOSIS — Z9119 Patient's noncompliance with other medical treatment and regimen: Secondary | ICD-10-CM | POA: Diagnosis present

## 2015-06-13 DIAGNOSIS — E1122 Type 2 diabetes mellitus with diabetic chronic kidney disease: Secondary | ICD-10-CM | POA: Diagnosis present

## 2015-06-13 DIAGNOSIS — D509 Iron deficiency anemia, unspecified: Secondary | ICD-10-CM | POA: Diagnosis present

## 2015-06-13 DIAGNOSIS — F17210 Nicotine dependence, cigarettes, uncomplicated: Secondary | ICD-10-CM | POA: Diagnosis present

## 2015-06-13 DIAGNOSIS — E1142 Type 2 diabetes mellitus with diabetic polyneuropathy: Secondary | ICD-10-CM | POA: Diagnosis present

## 2015-06-13 DIAGNOSIS — I5043 Acute on chronic combined systolic (congestive) and diastolic (congestive) heart failure: Secondary | ICD-10-CM | POA: Diagnosis present

## 2015-06-13 DIAGNOSIS — K219 Gastro-esophageal reflux disease without esophagitis: Secondary | ICD-10-CM | POA: Diagnosis present

## 2015-06-13 DIAGNOSIS — Z7982 Long term (current) use of aspirin: Secondary | ICD-10-CM | POA: Diagnosis not present

## 2015-06-13 DIAGNOSIS — I2510 Atherosclerotic heart disease of native coronary artery without angina pectoris: Secondary | ICD-10-CM | POA: Diagnosis present

## 2015-06-13 DIAGNOSIS — J9601 Acute respiratory failure with hypoxia: Secondary | ICD-10-CM | POA: Diagnosis present

## 2015-06-13 DIAGNOSIS — E1165 Type 2 diabetes mellitus with hyperglycemia: Secondary | ICD-10-CM | POA: Diagnosis present

## 2015-06-13 DIAGNOSIS — Z66 Do not resuscitate: Secondary | ICD-10-CM | POA: Diagnosis present

## 2015-06-13 DIAGNOSIS — L97529 Non-pressure chronic ulcer of other part of left foot with unspecified severity: Secondary | ICD-10-CM | POA: Diagnosis present

## 2015-06-13 DIAGNOSIS — Z79899 Other long term (current) drug therapy: Secondary | ICD-10-CM | POA: Diagnosis not present

## 2015-06-13 DIAGNOSIS — R0602 Shortness of breath: Secondary | ICD-10-CM | POA: Diagnosis present

## 2015-06-13 LAB — GLUCOSE, CAPILLARY
GLUCOSE-CAPILLARY: 129 mg/dL — AB (ref 65–99)
GLUCOSE-CAPILLARY: 162 mg/dL — AB (ref 65–99)
GLUCOSE-CAPILLARY: 118 mg/dL — AB (ref 65–99)
GLUCOSE-CAPILLARY: 152 mg/dL — AB (ref 65–99)
Glucose-Capillary: 128 mg/dL — ABNORMAL HIGH (ref 65–99)

## 2015-06-13 LAB — BASIC METABOLIC PANEL
Anion gap: 9 (ref 5–15)
BUN: 9 mg/dL (ref 6–20)
CHLORIDE: 98 mmol/L — AB (ref 101–111)
CO2: 31 mmol/L (ref 22–32)
Calcium: 8.8 mg/dL — ABNORMAL LOW (ref 8.9–10.3)
Creatinine, Ser: 1.26 mg/dL — ABNORMAL HIGH (ref 0.61–1.24)
GFR calc Af Amer: >60 mL/min (ref >60)
GFR, EST NON AFRICAN AMERICAN: 55 mL/min — AB (ref >60)
Glucose, Bld: 147 mg/dL — ABNORMAL HIGH (ref 65–99)
POTASSIUM: 3.8 mmol/L (ref 3.5–5.1)
SODIUM: 138 mmol/L (ref 135–145)

## 2015-06-13 LAB — TROPONIN I: Troponin I: 0.19 ng/mL — ABNORMAL HIGH (ref <0.031)

## 2015-06-13 NOTE — Progress Notes (Signed)
PROGRESS NOTE  Torry Istre JXB:[id number] DOB: April 29, 1941 DOA: 06/12/2015 PCP: Ambrose Finland, NP  HPI/Recap of past 56 hours: 74 year old male with past history of CHF, COPD, poorly controlled diabetes mellitus and medical noncompliance admitted on 8/7 for acute respiratory failure secondary to CHF exacerbation. Patient has been intermittently taking his home medications.  Assessment/Plan: Principal Problem:   Acute respiratory failure with hypoxia secondary to Acute on chronic systolic/diastolic congestive heart failure: Echocardiogram done April 2016 notes grade 1 diastolic dysfunction plus decreased ejection fraction of 30-35 percent. Patient started on IV diarrhetic's. Has diuresed over 2 L. He is down approximately 9 pounds from admission. Continue diuresis. Provided education. Active Problems:   Hypertension: Blood pressure stable   Foot ulcer: Followed by wound care, appreciate assistance   Acute respiratory failure with hypoxia   Chronic kidney disease, stage 3: Noted bump up in creatinine, likely secondary to diarrhetic's.   Type 2 diabetes mellitus with diabetic chronic kidney disease: CBGs stable. Continue sliding scale   Code Status: DO NOT RESUSCITATE  Family Communication: Patient declined for me to call his niece  Disposition Plan: Potential discharge tomorrow once fully diuresed   Consultants:  Wound care  Procedures:  None  Antibiotics:  None   Objective: BP 93/54 mmHg  Pulse 57  Temp(Src) 97.7 F (36.5 C) (Oral)  Resp 20  Ht 5' 9.5" (1.765 m)  Wt 64.139 kg (141 lb 6.4 oz)  BMI 20.59 kg/m2  SpO2 98%  Intake/Output Summary (Last 24 hours) at 06/13/15 1629 Last data filed at 06/13/15 1434  Gross per 24 hour  Intake   1380 ml  Output   1500 ml  Net   -120 ml   Filed Weights   06/12/15 1148 06/12/15 1608 06/13/15 0556  Weight: 68.04 kg (150 lb) 68.13 kg (150 lb 3.2 oz) 64.139 kg (141 lb 6.4 oz)    Exam:   General:  Alert and oriented 3, no  acute distress  Cardiovascular: Regular rate and rhythm, S1-S2, 2/6 systolic ejection murmur  Respiratory: Clear to auscultation bilaterally  Abdomen: Soft, nontender, nondistended, positive bowel sounds  Musculoskeletal: No clubbing or cyanosis or edema   Data Reviewed: Basic Metabolic Panel:  Recent Labs Lab 06/12/15 1214 06/12/15 1707 06/13/15 0503  NA 137  --  138  K 4.3  --  3.8  CL 105  --  98*  CO2 23  --  31  GLUCOSE 167*  --  147*  BUN 7  --  9  CREATININE 1.13 1.13 1.26*  CALCIUM 8.6*  --  8.8*   Liver Function Tests: No results for input(s): AST, ALT, ALKPHOS, BILITOT, PROT, ALBUMIN in the last 168 hours. No results for input(s): LIPASE, AMYLASE in the last 168 hours. No results for input(s): AMMONIA in the last 168 hours. CBC:  Recent Labs Lab 06/12/15 1214 06/12/15 1707  WBC 5.2 4.8  NEUTROABS 2.9  --   HGB 12.0* 13.3  HCT 36.7* 40.8  MCV 93.1 93.6  PLT 140* 158   Cardiac Enzymes:    Recent Labs Lab 06/12/15 1707 06/13/15 0503  TROPONINI 0.06* 0.19*   BNP (last 3 results)  Recent Labs  02/24/15 1911 03/26/15 1516 06/12/15 1214  BNP 1739.5* 1712.3* 1498.8*    ProBNP (last 3 results)  Recent Labs  08/12/14 1756  PROBNP 7427.0*    CBG:  Recent Labs Lab 06/12/15 1622 06/12/15 2005 06/13/15 0623 06/13/15 1059 06/13/15 1604  GLUCAP 129* 163* 128* 162* 118*    No  results found for this or any previous visit (from the past 240 hour(s)).   Studies: No results found.  Scheduled Meds: . aspirin  81 mg Oral Daily  . enoxaparin (LOVENOX) injection  40 mg Subcutaneous Q24H  . ferrous sulfate  325 mg Oral BID WC  . furosemide  80 mg Intravenous BID  . gabapentin  600 mg Oral TID  . insulin aspart  0-5 Units Subcutaneous QHS  . insulin aspart  0-9 Units Subcutaneous TID WC  . isosorbide mononitrate  30 mg Oral Daily  . lisinopril  2.5 mg Oral Daily  . metoprolol tartrate  12.5 mg Oral BID  . potassium chloride SA  20 mEq  Oral Daily  . sodium chloride  3 mL Intravenous Q12H  . terbinafine  1 application Topical BID    Continuous Infusions:    Time spent: 25 minutes  Hollice Espy  [facility] Pager [phone]. If 7PM-7AM, please contact night-coverage at [email or web address], password [facility] 06/13/2015, 4:29 PM  LOS: 0 days

## 2015-06-13 NOTE — Consult Note (Signed)
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 Recent surgical debridement per patient.  Using Medihoney currently.  Wound type:Neuropathic ulcer Pressure Ulcer POA: Yes Measurement: 2 cm x 1 cm x 0.2 cm  Wound bed:100% pale pink and nongranulating.  Drainage (amount, consistency, odor) Minimal serosanguinous drainage.  Musty odor.  Periwound:Intact Dressing procedure/placement/frequency:Cleanse ulcer to left lateral foot with NS and pat gently dry.  Apply Xerofoam gauze to wound bed. Cover with 4x4 and secure with kerlix and tape.  Change daily.  Will not follow at this time.  Please re-consult if needed.  Maple Hudson RN BSN [facility] Pager [phone]

## 2015-06-13 NOTE — Consult Note (Signed)
   [facility] CM Inpatient Consult   06/13/2015  Orlen Leedy January 27, 1941 [id number]    Referral received. Patient evaluated for [facility] Care Management services. Patient is not eligible for [facility] Care Management services because unfortunately, patient's Medicare is not in the delegation for [facility] Care Management services at this time.Thank you for the referral.   For questions, please contact: Charlesetta Shanks, RN BSN CCM [facility]  [phone] business mobile phone

## 2015-06-13 NOTE — Hospital Discharge Follow-Up (Signed)
This patient has been followed at the [facility]  ( [facility] ) at the [facility] and has an appointment scheduled for 06/24/15 @ 1445. This CM met w/ the patient and he is agreeable to continue to being followed at [facility] after discharge. An appointment was rescheduled for 06/17/15 @ 1430.   He informed this CM that he just received the copy of his discharge papers from the [place],  He said that he will need to find transportation to the [place] clinic to be evaluated in order to receive any [facility] services.  He has many family members in the [place] area but is not sure if they will be able to provide transportation for him.  He said that he continues to do his own wound care and is adamant about not wanting any home care services.   Update provided to Olga Coaster, CM.  CM to continue to follow the patient's hospital course.

## 2015-06-14 LAB — BASIC METABOLIC PANEL
ANION GAP: 7 (ref 5–15)
BUN: 16 mg/dL (ref 6–20)
CALCIUM: 8.5 mg/dL — AB (ref 8.9–10.3)
CO2: 32 mmol/L (ref 22–32)
Chloride: 97 mmol/L — ABNORMAL LOW (ref 101–111)
Creatinine, Ser: 1.34 mg/dL — ABNORMAL HIGH (ref 0.61–1.24)
GFR calc Af Amer: 59 mL/min — ABNORMAL LOW (ref >60)
GFR calc non Af Amer: 51 mL/min — ABNORMAL LOW (ref >60)
Glucose, Bld: 120 mg/dL — ABNORMAL HIGH (ref 65–99)
POTASSIUM: 3.3 mmol/L — AB (ref 3.5–5.1)
Sodium: 136 mmol/L (ref 135–145)

## 2015-06-14 LAB — GLUCOSE, CAPILLARY
Glucose-Capillary: 123 mg/dL — ABNORMAL HIGH (ref 65–99)
Glucose-Capillary: 193 mg/dL — ABNORMAL HIGH (ref 65–99)

## 2015-06-14 MED ORDER — LISINOPRIL 2.5 MG PO TABS: 2.5000 mg | ORAL_TABLET | Freq: Every day | ORAL | Status: DC

## 2015-06-14 MED ORDER — FERROUS SULFATE 325 (65 FE) MG PO TABS: 325.0000 mg | ORAL_TABLET | Freq: Two times a day (BID) | ORAL | Status: DC

## 2015-06-14 MED ORDER — CARVEDILOL 3.125 MG PO TABS: 3.1250 mg | ORAL_TABLET | Freq: Two times a day (BID) | ORAL | Status: DC

## 2015-06-14 NOTE — Progress Notes (Signed)
Pt refused troponin lab draw.  MD paged.

## 2015-06-14 NOTE — Discharge Instructions (Signed)
WEIGH YOURSELF EVERY MORNING

## 2015-06-14 NOTE — Discharge Summary (Signed)
Discharge Summary  Russell Hamilton OZH:[id number] DOB: Sep 29, 1941  PCP: Ambrose Finland, NP  Admit date: 06/12/2015 Discharge date: 06/14/2015  Time spent: 25 minutes  Recommendations for Outpatient Follow-up:  1. Patient advised to weigh himself daily 2. He'll follow-up with his primary care physician in the next few weeks   Discharge Diagnoses:  Active Hospital Problems   Diagnosis Date Noted  . Acute on chronic combined systolic and diastolic congestive heart failure 06/12/2015  . Chronic kidney disease, stage 3 03/01/2015  . Type 2 diabetes mellitus with diabetic chronic kidney disease   . Foot ulcer 11/20/2014  . Acute respiratory failure with hypoxia   . Hypertension     Resolved Hospital Problems   Diagnosis Date Noted Date Resolved  No resolved problems to display.    Discharge Condition: Improved, being discharged home  Diet recommendation: Heart healthy  Filed Weights   06/12/15 1608 06/13/15 0556 06/14/15 0533  Weight: 68.13 kg (150 lb 3.2 oz) 64.139 kg (141 lb 6.4 oz) 63.912 kg (140 lb 14.4 oz)    History of present illness:  74 year old male with past history of CHF, COPD, poorly controlled diabetes mellitus and medical noncompliance admitted on 8/7 for acute respiratory failure secondary to CHF exacerbation. Patient has been intermittently taking his home medications.  Hospital Course:  Principal Problem:  Acute respiratory failure with hypoxia secondary to Acute on chronic systolic/diastolic congestive heart failure: Echocardiogram done April 2016 notes grade 1 diastolic dysfunction plus decreased ejection fraction of 30-35 percent. Patient started on IV diuretics  Has diuresed over 2 L. He is down approximately 10 pounds from admission. By 8/9, felt to be down to dry weight of 63.9 kg, 140 pounds. And discharged. Patient given education for daily weight measurements and compliance Active Problems:  Hypertension: Blood pressure stable  Foot ulcer: Followed by  wound care, appreciate assistance. They clean the ulcer on his left lateral foot with normal saline dried and Xeroform gauze to wound bed along with covered by 4 x 4 and secured with tape and changed daily.   Chronic kidney disease, stage 3: Noted bump up in creatinine, likely secondary to diuresis  Type 2 diabetes mellitus with diabetic chronic kidney disease: CBGs stable. Continue sliding scalese   Procedures:  None  Consultations:  None  Discharge Exam: BP 115/54 mmHg  Pulse 57  Temp(Src) 97.2 F (36.2 C) (Oral)  Resp 18  Ht 5' 9.5" (1.765 m)  Wt 63.912 kg (140 lb 14.4 oz)  BMI 20.52 kg/m2  SpO2 99%  General: Alert and oriented 3, no acute distress Cardiovascular: Regular rate and rhythm, S1-S2, 2/6 systolic ejection murmur Respiratory: Clear to auscultation bilaterally  Discharge Instructions You were cared for by a hospitalist during your hospital stay. If you have any questions about your discharge medications or the care you received while you were in the hospital after you are discharged, you can call the unit and asked to speak with the hospitalist on call if the hospitalist that took care of you is not available. Once you are discharged, your primary care physician will handle any further medical issues. Please note that NO REFILLS for any discharge medications will be authorized once you are discharged, as it is imperative that you return to your primary care physician (or establish a relationship with a primary care physician if you do not have one) for your aftercare needs so that they can reassess your need for medications and monitor your lab values.  Discharge Instructions  Diet - low sodium heart healthy    Complete by:  As directed      Increase activity slowly    Complete by:  As directed             Medication List    TAKE these medications        acetaminophen-codeine 300-30 MG per tablet  Commonly known as:  TYLENOL #3  Take 1 tablet by mouth  every 8 (eight) hours as needed for moderate pain.     aspirin 81 MG chewable tablet  Chew 1 tablet (81 mg total) by mouth daily.     carvedilol 3.125 MG tablet  Commonly known as:  COREG  Take 1 tablet (3.125 mg total) by mouth 2 (two) times daily with a meal.     ferrous sulfate 325 (65 FE) MG tablet  Take 1 tablet (325 mg total) by mouth 2 (two) times daily with a meal.     furosemide 40 MG tablet  Commonly known as:  LASIX  Take 1 tablet (40 mg total) by mouth daily.     gabapentin 300 MG capsule  Commonly known as:  NEURONTIN  Take 2 capsules (600 mg total) by mouth 3 (three) times daily.     isosorbide mononitrate 30 MG 24 hr tablet  Commonly known as:  IMDUR  Take 1 tablet (30 mg total) by mouth daily.     lisinopril 2.5 MG tablet  Commonly known as:  PRINIVIL,ZESTRIL  Take 1 tablet (2.5 mg total) by mouth daily.     sitaGLIPtin 50 MG tablet  Commonly known as:  JANUVIA  Take 1 tablet (50 mg total) by mouth daily. For diabetes       No Known Allergies     Follow-up Information    Follow up with [facility]. Go on 06/17/2015.   Specialty:  Internal Medicine   Why:  at 2:30pm. with Dr Venetia Night.    Contact information:   [address] [id number] [address] [place] [address] [phone]       The results of significant diagnostics from this hospitalization (including imaging, microbiology, ancillary and laboratory) are listed below for reference.    Significant Diagnostic Studies: Dg Chest 2 View  06/12/2015   CLINICAL DATA:  Shortness of breast since last night, hypertension, diabetes mellitus, CHF, GERD, COPD, smoker  EXAM: CHEST  2 VIEW  COMPARISON:  03/26/2015  FINDINGS: Enlargement of cardiac silhouette with pulmonary vascular congestion.  Mediastinal contours normal.  Minimal accentuation of perihilar markings consistent with mild pulmonary edema and CHF, improved since previous exams.  Bibasilar effusions and  atelectasis.  No pneumothorax.  Osseous structures unremarkable.  IMPRESSION: Enlargement of cardiac silhouette with pulmonary vascular congestion and improved CHF.  Bibasilar effusions and atelectasis.   Electronically Signed   By: Ulyses Southward M.D.   On: 06/12/2015 13:12    Microbiology: No results found for this or any previous visit (from the past 240 hour(s)).   Labs: Basic Metabolic Panel:  Recent Labs Lab 06/12/15 1214 06/12/15 1707 06/13/15 0503 06/14/15 0529  NA 137  --  138 136  K 4.3  --  3.8 3.3*  CL 105  --  98* 97*  CO2 23  --  31 32  GLUCOSE 167*  --  147* 120*  BUN 7  --  9 16  CREATININE 1.13 1.13 1.26* 1.34*  CALCIUM 8.6*  --  8.8* 8.5*   Liver Function Tests: No results for  input(s): AST, ALT, ALKPHOS, BILITOT, PROT, ALBUMIN in the last 168 hours. No results for input(s): LIPASE, AMYLASE in the last 168 hours. No results for input(s): AMMONIA in the last 168 hours. CBC:  Recent Labs Lab 06/12/15 1214 06/12/15 1707  WBC 5.2 4.8  NEUTROABS 2.9  --   HGB 12.0* 13.3  HCT 36.7* 40.8  MCV 93.1 93.6  PLT 140* 158   Cardiac Enzymes:  Recent Labs Lab 06/12/15 1707 06/13/15 0503  TROPONINI 0.06* 0.19*   BNP: BNP (last 3 results)  Recent Labs  02/24/15 1911 03/26/15 1516 06/12/15 1214  BNP 1739.5* 1712.3* 1498.8*    ProBNP (last 3 results)  Recent Labs  08/12/14 1756  PROBNP 7427.0*    CBG:  Recent Labs Lab 06/13/15 1059 06/13/15 1604 06/13/15 2059 06/14/15 0638 06/14/15 1141  GLUCAP 162* 118* 152* 123* 193*       Signed:  Lamyah Creed K  [facility] 06/14/2015, 6:38 PM

## 2015-06-14 NOTE — Progress Notes (Signed)
CSW notified by RN that patient was requesting a taxi voucher to return home today. CSW spoke with patient about possible other options- family, friends etc.  He stated that his niece does not get off work until [employer]. CSW discussed taxi services and asked patient if he could pay for a taxi home.  He became very irritated and stated "Never mind- I can pay for my own taxi and will call them myself. I don't need your help."  CSW attempted to offer to contact a taxi for him but he declined. CSW notified RN to prepare his d/c and that he would arrange his own transportation.  CSW signing off.  Lorri Frederick. Jaci Lazier, [place] [phone]

## 2015-06-15 ENCOUNTER — Telehealth: Payer: Self-pay

## 2015-06-15 NOTE — Telephone Encounter (Signed)
Call placed to the patient to check on his status.  Voice mail message left requesting a call back to # [phone] or [phone].

## 2015-06-16 ENCOUNTER — Telehealth: Payer: Self-pay

## 2015-06-16 NOTE — Telephone Encounter (Signed)
[facility] Post-discharge Follow-Up Phone Call:  Date of Discharge: 06/14/15 Principal Discharge Diagnosis(es): Acute on chronic combined systolic and diastolic congestive heart failure, chronic kidney disease-Stage 3, type 2 diabetes mellitus with diabetic chronic kidney disease, acute respiratory failure with hypoxia, hypertension Post-discharge Communication: Attempt #2 to reach patient to complete post-discharge follow-up phone call.  Call placed to #[phone]; unable to reach. Left HIPPA compliant voicemail requesting return call. Call completed: No

## 2015-06-16 NOTE — Telephone Encounter (Signed)
[facility] Post-discharge Follow-Up Phone Call:  Date of Discharge: 06/14/15 Principal Discharge Diagnosis(es): Acute on chronic systolic and diastolic congestive heart failure, chronic kidney disease, Stage 3, type 2 diabetes mellitus with diabetic chronic kidney disease, acute respiratory failure with hypoxia Post-discharge Communication:  Received return call from patient. Call Completed: Yes                    With Whom: Patient Interpreter Needed: No              Language/Dialect: English     Please check all that apply:  X  Patient is knowledgeable of his/her condition(s) and/or treatment. X  Patient is caring for self at home.  ? Patient is receiving assist at home from family and/or caregiver. Family and/or caregiver is knowledgeable of patient's condition(s) and/or treatment. ? Patient is receiving home health services. If so, name of agency.     Medication Reconciliation:  X  Medication list reviewed with patient. X  Patient has not obtained all discharge medications. Thoroughly went over all of patient's discharge medications, and patient indicates he has not picked up medications. Prescriptions for discharge medications were sent to [facility]. Patient has appointment tomorrow at Cardiologist. Informed patient that [facility] located very close to Cardiologist. Encouraged patient to pick up medications after Cardiology appointment.  Patient verbalized understanding.   Activities of Daily Living:  X  Independent-Patient continues to decline any assistance with activities of daily living, including home health services. ? Needs assist     Community resources in place for patient:  X  None-Patient continues to decline any assistance with activities of daily living, including home health services. ? Home Health/Home DME ? Assisted Living ? Support Group          Patient Education: Patient reminded of appointment on  06/17/15 at 1345 with Cardiologist. He indicates he plans to keep appointment with Cardiologist but will need to reschedule TCC appointment on 06/17/15 at 1430.  Appointment rescheduled to 06/20/15 at 1400. Patient agreeable and indicates he will need transportation to appointment on 06/20/15 at 1400.  Cecilia notified and transportation will be arranged to TCC appointment at 1400. Patient also informed of appointment with Podiatrist on 06/20/15 at 1600. Patient verbalized understanding. Patient indicates he will obtain transportation to his Cardiology and Podiatry appointments. No other needs identified.

## 2015-06-17 ENCOUNTER — Ambulatory Visit (INDEPENDENT_AMBULATORY_CARE_PROVIDER_SITE_OTHER): Payer: Medicare Other | Admitting: Cardiology

## 2015-06-17 ENCOUNTER — Encounter: Payer: Self-pay | Admitting: Cardiology

## 2015-06-17 ENCOUNTER — Ambulatory Visit: Payer: Medicare Other | Admitting: Family Medicine

## 2015-06-17 VITALS — BP 122/68 | HR 98 | Ht 69.5 in | Wt 149.0 lb

## 2015-06-17 DIAGNOSIS — I5043 Acute on chronic combined systolic (congestive) and diastolic (congestive) heart failure: Secondary | ICD-10-CM | POA: Diagnosis not present

## 2015-06-17 DIAGNOSIS — Z720 Tobacco use: Secondary | ICD-10-CM

## 2015-06-17 DIAGNOSIS — N182 Chronic kidney disease, stage 2 (mild): Secondary | ICD-10-CM | POA: Diagnosis not present

## 2015-06-17 DIAGNOSIS — I10 Essential (primary) hypertension: Secondary | ICD-10-CM

## 2015-06-17 DIAGNOSIS — I639 Cerebral infarction, unspecified: Secondary | ICD-10-CM | POA: Diagnosis not present

## 2015-06-17 DIAGNOSIS — F17200 Nicotine dependence, unspecified, uncomplicated: Secondary | ICD-10-CM

## 2015-06-17 DIAGNOSIS — R778 Other specified abnormalities of plasma proteins: Secondary | ICD-10-CM

## 2015-06-17 DIAGNOSIS — R7989 Other specified abnormal findings of blood chemistry: Secondary | ICD-10-CM

## 2015-06-17 NOTE — Progress Notes (Signed)
Patient ID: Russell Hamilton, male   DOB: [birth year], 74 y.o.   MRN: [id number]      Cardiology Office Note   Date:  06/17/2015   ID:  Russell Hamilton, DOB 1941-02-11, MRN [id number]  PCP:  Ambrose Finland, NP  Cardiologist:  Lars Masson, MD   No chief complaint on file.     History of Present Illness: Russell Hamilton is a 74 y.o. male with a history of stroke, hypertension, diabetes type 2, peripheral neuropathy, who presented to [facility] yesterday with shortness of breath and found to be in an acute CHF exacerbation.  Patient recently moved from [place] to [place] to be closer to family. He reports that he has hx of hypertension and diabetes, which were well controlled in the past and therefore he stopped taking medications. "[facility] that was no longer hypertensive and did not need medications anymore." He has not been on any BP meds for over 2 years. He ran out of his other medications including metformin, gabapentin and ASA about a month ago. He has never been followed by a cardiologist.  He was hospitalized on August 13, 2014 for acute systolic CHF. He has a family history of CAD in sister and brother who both have had interventions in their 50's and 28s respectively. He also has a long history of heavy tobacco abuse. In the ED he was found to have elevated d-dimer. CTA negative for PE. Chest x-ray showed bilateral pleural effusion and pulmonary edema. ProBNP 7427. 2D ECHO today with EF 40%, diffuse hypokinesis possibly worse in the inferior wall. Mild MR, mild LA dilation, PA pressure 50. Trivial pericardial effusion. When discussed the option of a cardiac cath or stress test, he very strongly refused. He feels that if it cannot be treated medically then it is his time to go.   06/17/2015 - the patient was admitted for acute on chronic systolic CHF in May and again on 06/12/2015, he was diuresed with iv diuretics and discharged home on his home dose of Lasix 40 mg po daily. Today, the  patient states that his shortness of breath has improved, he denies orthopnea, PND, but has residual LE edema. No chest pain.   Past Medical History  Diagnosis Date  . Hypertension   . Chronic systolic CHF (congestive heart failure)     a. 2D ECHO 08/13/14: EF 40%, diffuse hypokinesis possibly worse in the inferior wall. Mild MR, mild LA dilation, PA pressure 50. Trivial pericardial effusion.  . Acute systolic CHF (congestive heart failure), NYHA class 4 08/12/2014  . Type II diabetes mellitus   . GERD (gastroesophageal reflux disease)   . Peripheral neuropathy     Russell Hamilton 08/12/2014  . Stroke 2010    "memory problems since; right leg and left arm weakness since" (02/08/2015)  . Ulcers of both lower extremities     Russell Hamilton 02/24/2015  . Noncompliance     Russell Hamilton 02/24/2015  . COPD (chronic obstructive pulmonary disease)     Past Surgical History  Procedure Laterality Date  . Carotid endarterectomy Right 2010  . Hand ligament reconstruction Right ?1970's    "pinky"     Current Outpatient Prescriptions  Medication Sig Dispense Refill  . acetaminophen-codeine (TYLENOL #3) 300-30 MG per tablet Take 1 tablet by mouth every 8 (eight) hours as needed for moderate pain. 30 tablet 0  . aspirin 81 MG chewable tablet Chew 1 tablet (81 mg total) by mouth daily. 30 tablet 3  .  carvedilol (COREG) 3.125 MG tablet Take 1 tablet (3.125 mg total) by mouth 2 (two) times daily with a meal. 60 tablet 2  . ferrous sulfate 325 (65 FE) MG tablet Take 1 tablet (325 mg total) by mouth 2 (two) times daily with a meal. 30 tablet 0  . furosemide (LASIX) 40 MG tablet Take 1 tablet (40 mg total) by mouth daily. (Patient taking differently: Take 80 mg by mouth daily. ) 30 tablet 1  . gabapentin (NEURONTIN) 300 MG capsule Take 2 capsules (600 mg total) by mouth 3 (three) times daily. 90 capsule 2  . isosorbide mononitrate (IMDUR) 30 MG 24 hr tablet Take 1 tablet (30 mg total) by mouth daily. 30 tablet 2  . lisinopril  (PRINIVIL,ZESTRIL) 2.5 MG tablet Take 1 tablet (2.5 mg total) by mouth daily. 30 tablet 2  . sitaGLIPtin (JANUVIA) 50 MG tablet Take 1 tablet (50 mg total) by mouth daily. For diabetes 30 tablet 3   No current facility-administered medications for this visit.    Allergies:   Review of patient's allergies indicates no known allergies.    Social History:  The patient  reports that he has been smoking Cigarettes.  He has a 28 pack-year smoking history. He has never used smokeless tobacco. He reports that he uses illicit drugs (Marijuana). He reports that he does not drink alcohol.   Family History:  The patient's family history includes Cancer in his brother; Heart attack in his brother; Kidney disease in his mother.    ROS:  Please see the history of present illness.   Otherwise, review of systems are positive for none.   All other systems are reviewed and negative.    PHYSICAL EXAM: VS:  BP 122/68 mmHg  Pulse 98  Ht 5' 9.5" (1.765 m)  Wt 149 lb (67.586 kg)  BMI 21.70 kg/m2 , BMI Body mass index is 21.7 kg/(m^2). GEN: Well nourished, well developed, in no acute distress HEENT: normal Neck: no JVD, carotid bruits, or masses Cardiac: RRR; 2/6 systolic murmur, rubs, or gallops, very mild LE edema  Respiratory:  Minimal rales in the basis, normal work of breathing GI: soft, nontender, nondistended, + BS MS: no deformity or atrophy Skin: warm and dry, no rash Neuro:  Strength and sensation are intact Psych: euthymic mood, full affect  EKG:  EKG is not ordered today.  Recent Labs: 08/12/2014: Pro B Natriuretic peptide (BNP) 7427.0* 08/13/2014: Magnesium 1.8 12/11/2014: ALT 11 06/12/2015: B Natriuretic Peptide 1498.8*; Hemoglobin 13.3; Platelets 158; TSH 0.707 06/14/2015: BUN 16; Creatinine, Ser 1.34*; Potassium 3.3*; Sodium 136    Lipid Panel    Component Value Date/Time   CHOL 167 11/20/2014 0420   TRIG 85 11/20/2014 0420   HDL 55 11/20/2014 0420   CHOLHDL 3.0 11/20/2014 0420    VLDL 17 11/20/2014 0420   LDLCALC 95 11/20/2014 0420   LDLDIRECT 93 08/12/2014 2335      Wt Readings from Last 3 Encounters:  06/17/15 149 lb (67.586 kg)  06/14/15 140 lb 14.4 oz (63.912 kg)  05/27/15 150 lb 3.2 oz (68.13 kg)    Echocardiogram - 08/13/2014 - Left ventricle: Diffuse hypokinesis possibly worse in inferior wall. The cavity size was mildly dilated. Wall thickness was normal. The estimated ejection fraction was 40%. - Mitral valve: Calcified annulus. There was mild regurgitation. - Left atrium: The atrium was mildly dilated. - Atrial septum: No defect or patent foramen ovale was identified. - Pulmonary arteries: PA peak pressure: 50 mm Hg (S). - Pericardium,  extracardiac: A trivial pericardial effusion was Identified.  TTE: 02/2015 - Left ventricle: The cavity size was mildly dilated. Wall thickness was increased in a pattern of mild LVH. Systolic function was moderately to severely reduced. The estimated ejection fraction was in the range of 30% to 35%. Diffuse hypokinesis. Doppler parameters are consistent with abnormal left ventricular relaxation (grade 1 diastolic dysfunction). Doppler parameters are consistent with high ventricular filling pressure. - Aortic valve: Valve area (VTI): 1.33 cm^2. Valve area (Vmax): 1.3 cm^2. Valve area (Vmean): 1.23 cm^2. - Mitral valve: Calcified annulus. Valve area by continuity equation (using LVOT flow): 1.68 cm^2. - Left atrium: The atrium was moderately dilated. - Right atrium: The atrium was moderately dilated. - Tricuspid valve: There was moderate regurgitation. - Pulmonary arteries: PA peak pressure: 43 mm Hg (S). - Pericardium, extracardiac: A trivial pericardial effusion was identified.  ECG - SR, LVH, STD and negative T waves in the lateral leads, consider lateral ischemia   ASSESSMENT AND PLAN:  Suyash Amory is a 74 y.o. male with a history of stroke, hypertension, diabetes type 2, peripheral  neuropathy, who is coming for the first posthospitalization visit for acute systolic CHF exacerbation.   1. Acute on chronic systolic CHF exacerbation-  - multiple readmissions in the last year, also Troponin elevation - etiology is unknown and LVEF has worsened from 40--> 30-35%, he is refusing a stress test or a cath - we will continue the same dose of lasix considering his Crea has been elevated on the discharge day 1 --> 1.3, he doesn't want to have it checked today, we will see him in 1 month and repeat BMP and BNP then - no referral for an ICD as he is refusing work up that could potentially lead to interventions that could improve his LVEF (like cath).  2. Elevated troponin - no ischemic work up as he is refusing  3. HTN - controlled  4. Tobacco abuse- counseled on cessation   5. History of stroke  -- on ASA   Follow up in 1 month with BMP and BNP.  Signed, Lars Masson, MD  06/17/2015 1:51 PM    [facility] [address] [place], [place], [place]  [address] Phone: [phone]; Fax: [phone]

## 2015-06-17 NOTE — Patient Instructions (Signed)
Medication Instructions:  Your physician recommends that you continue on your current medications as directed. Please refer to the Current Medication list given to you today.   Labwork: Your physician recommends that you return for lab work in: 1 month (Bmet, Lft,Bnp)  Testing/Procedures: None   Follow-Up: Your physician recommends that you schedule a follow-up appointment in: 1 month with Dr.Nelson or an APP   Any Other Special Instructions Will Be Listed Below (If Applicable).

## 2015-06-20 ENCOUNTER — Encounter: Payer: Self-pay | Admitting: Podiatry

## 2015-06-20 ENCOUNTER — Ambulatory Visit (INDEPENDENT_AMBULATORY_CARE_PROVIDER_SITE_OTHER): Payer: Medicare Other | Admitting: Podiatry

## 2015-06-20 ENCOUNTER — Encounter: Payer: Self-pay | Admitting: Family Medicine

## 2015-06-20 ENCOUNTER — Ambulatory Visit: Payer: Medicare Other | Attending: Family Medicine | Admitting: Family Medicine

## 2015-06-20 VITALS — BP 115/67 | HR 94 | Temp 97.8°F | Ht 69.5 in | Wt 146.6 lb

## 2015-06-20 VITALS — BP 120/71 | HR 95 | Temp 97.9°F | Resp 18

## 2015-06-20 DIAGNOSIS — E1122 Type 2 diabetes mellitus with diabetic chronic kidney disease: Secondary | ICD-10-CM | POA: Insufficient documentation

## 2015-06-20 DIAGNOSIS — N189 Chronic kidney disease, unspecified: Secondary | ICD-10-CM | POA: Insufficient documentation

## 2015-06-20 DIAGNOSIS — E1165 Type 2 diabetes mellitus with hyperglycemia: Secondary | ICD-10-CM | POA: Insufficient documentation

## 2015-06-20 DIAGNOSIS — I639 Cerebral infarction, unspecified: Secondary | ICD-10-CM | POA: Diagnosis not present

## 2015-06-20 DIAGNOSIS — I5043 Acute on chronic combined systolic (congestive) and diastolic (congestive) heart failure: Secondary | ICD-10-CM

## 2015-06-20 DIAGNOSIS — L97529 Non-pressure chronic ulcer of other part of left foot with unspecified severity: Secondary | ICD-10-CM | POA: Diagnosis not present

## 2015-06-20 DIAGNOSIS — E876 Hypokalemia: Secondary | ICD-10-CM | POA: Insufficient documentation

## 2015-06-20 DIAGNOSIS — G629 Polyneuropathy, unspecified: Secondary | ICD-10-CM | POA: Insufficient documentation

## 2015-06-20 LAB — BASIC METABOLIC PANEL
BUN: 9 mg/dL (ref 7–25)
CALCIUM: 8.8 mg/dL (ref 8.6–10.3)
CHLORIDE: 98 mmol/L (ref 98–110)
CO2: 30 mmol/L (ref 20–31)
CREATININE: 0.99 mg/dL (ref 0.70–1.18)
Glucose, Bld: 220 mg/dL — ABNORMAL HIGH (ref 65–99)
Potassium: 3.9 mmol/L (ref 3.5–5.3)
Sodium: 135 mmol/L (ref 135–146)

## 2015-06-20 LAB — GLUCOSE, POCT (MANUAL RESULT ENTRY): POC GLUCOSE: 222 mg/dL — AB (ref 70–99)

## 2015-06-20 MED ORDER — ASPIRIN 81 MG PO CHEW: 81.0000 mg | CHEWABLE_TABLET | Freq: Every day | ORAL | Status: DC

## 2015-06-20 MED ORDER — ACETAMINOPHEN-CODEINE #3 300-30 MG PO TABS: 1.0000 | ORAL_TABLET | Freq: Three times a day (TID) | ORAL | Status: DC | PRN

## 2015-06-20 MED ORDER — FUROSEMIDE 40 MG PO TABS: 40.0000 mg | ORAL_TABLET | Freq: Every day | ORAL | Status: DC

## 2015-06-20 MED ORDER — SILVER SULFADIAZINE 1 % EX CREA: 1.0000 "application " | TOPICAL_CREAM | Freq: Every day | CUTANEOUS | Status: DC

## 2015-06-20 MED ORDER — FERROUS SULFATE 325 (65 FE) MG PO TABS: 325.0000 mg | ORAL_TABLET | Freq: Two times a day (BID) | ORAL | Status: DC

## 2015-06-20 MED ORDER — GABAPENTIN 300 MG PO CAPS: 600.0000 mg | ORAL_CAPSULE | Freq: Three times a day (TID) | ORAL | Status: DC

## 2015-06-20 MED ORDER — GLIMEPIRIDE 2 MG PO TABS: 2.0000 mg | ORAL_TABLET | Freq: Every day | ORAL | Status: DC

## 2015-06-20 MED ORDER — SITAGLIPTIN PHOSPHATE 50 MG PO TABS: 50.0000 mg | ORAL_TABLET | Freq: Every day | ORAL | Status: DC

## 2015-06-20 NOTE — Patient Instructions (Addendum)
Please find your medications and if you are unable to find them then please call the clinic.  Please continue to take the following medications: Januvia Glimepiride Tylenol #3  Gabapentin Furosemide (Lasix) Aspirin Ferrous Sulfate
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
## 2015-06-20 NOTE — Progress Notes (Signed)
[facility]  Russell Hamilton, is a 74 y.o. male  ZOX:[id number]  WJX:[id number]  DOB - [birth year]  CC:  Chief Complaint  Patient presents with  . Follow-up  . Diabetes       HPI: Russell Hamilton is a 74 y.o. male here today to establish medical care. Patient has No headache, No chest pain, No abdominal pain - No Nausea, No new weakness tingling or numbness, No Cough - SOB.  No Known Allergies Past Medical History  Diagnosis Date  . Hypertension   . Chronic systolic CHF (congestive heart failure)     a. 2D ECHO 08/13/14: EF 40%, diffuse hypokinesis possibly worse in the inferior wall. Mild MR, mild LA dilation, PA pressure 50. Trivial pericardial effusion.  . Acute systolic CHF (congestive heart failure), NYHA class 4 08/12/2014  . Type II diabetes mellitus   . GERD (gastroesophageal reflux disease)   . Peripheral neuropathy     Russell Hamilton 08/12/2014  . Stroke 2010    "memory problems since; right leg and left arm weakness since" (02/08/2015)  . Ulcers of both lower extremities     Russell Hamilton 02/24/2015  . Noncompliance     Russell Hamilton 02/24/2015  . COPD (chronic obstructive pulmonary disease)    Current Outpatient Prescriptions on File Prior to Visit  Medication Sig Dispense Refill  . acetaminophen-codeine (TYLENOL #3) 300-30 MG per tablet Take 1 tablet by mouth every 8 (eight) hours as needed for moderate pain. 30 tablet 0  . gabapentin (NEURONTIN) 300 MG capsule Take 2 capsules (600 mg total) by mouth 3 (three) times daily. 90 capsule 2  . aspirin 81 MG chewable tablet Chew 1 tablet (81 mg total) by mouth daily. (Patient not taking: Reported on 06/20/2015) 30 tablet 3  . carvedilol (COREG) 3.125 MG tablet Take 1 tablet (3.125 mg total) by mouth 2 (two) times daily with a meal. (Patient not taking: Reported on 06/20/2015) 60 tablet 2  . ferrous sulfate 325 (65 FE) MG tablet Take 1 tablet (325 mg total) by mouth 2 (two) times daily with a meal. (Patient not taking: Reported on 06/20/2015)  30 tablet 0  . furosemide (LASIX) 40 MG tablet Take 1 tablet (40 mg total) by mouth daily. (Patient not taking: Reported on 06/20/2015) 30 tablet 1  . isosorbide mononitrate (IMDUR) 30 MG 24 hr tablet Take 1 tablet (30 mg total) by mouth daily. (Patient not taking: Reported on 06/20/2015) 30 tablet 2  . lisinopril (PRINIVIL,ZESTRIL) 2.5 MG tablet Take 1 tablet (2.5 mg total) by mouth daily. (Patient not taking: Reported on 06/20/2015) 30 tablet 2  . sitaGLIPtin (JANUVIA) 50 MG tablet Take 1 tablet (50 mg total) by mouth daily. For diabetes (Patient not taking: Reported on 06/20/2015) 30 tablet 3   No current facility-administered medications on file prior to visit.   Family History  Problem Relation Age of Onset  . Kidney disease Mother   . Heart attack Brother     X 2  . Cancer Brother    Social History   Social History  . Marital Status: Married    Spouse Name: N/A  . Number of Children: N/A  . Years of Education: N/A   Occupational History  . Not on file.   Social History Main Topics  . Smoking status: Current Every Day Smoker -- 0.50 packs/day for 56 years    Types: Cigarettes  . Smokeless tobacco: Never Used  . Alcohol Use: No     Comment: 02/08/2015 "I might have a  beer q 6 months"  . Drug Use: Yes    Special: Marijuana     Comment: last marijuana use 05/22/15  . Sexual Activity: Not Currently   Other Topics Concern  . Not on file   Social History Narrative    Review of Systems: Constitutional: Negative for fever, chills, diaphoresis, activity change, appetite change and fatigue. HENT: Negative for ear pain, nosebleeds, congestion, facial swelling, rhinorrhea, neck pain, neck stiffness and ear discharge.  Eyes: Negative for pain, discharge, redness, itching and visual disturbance. Respiratory: Negative for cough, choking, chest tightness, shortness of breath, wheezing and stridor.  Cardiovascular: Negative for chest pain, palpitations and leg  swelling. Gastrointestinal: Negative for abdominal distention. Genitourinary: Negative for dysuria, urgency, frequency, hematuria, flank pain, decreased urine volume, difficulty urinating and dyspareunia.  Musculoskeletal: Negative for back pain, joint swelling, arthralgia and gait problem. Neurological: Negative for dizziness, tremors, seizures, syncope, facial asymmetry, speech difficulty, weakness, light-headedness, numbness and headaches.  Hematological: Negative for adenopathy. Does not bruise/bleed easily. Skin: Negative for rash, ulcer. Psychiatric/Behavioral: Negative for hallucinations, behavioral problems, confusion, dysphoric mood, decreased concentration and agitation.    Objective:   Filed Vitals:   06/20/15 1408  BP: 115/67  Pulse: 94  Temp: 97.8 F (36.6 C)    Physical Exam: Constitutional: Patient appears well-developed and well-nourished. No distress. HENT: Normocephalic, atraumatic, External right and left ear normal. Oropharynx is clear and moist.  Eyes: Conjunctivae and EOM are normal. PERRLA, no scleral icterus. Neck: Normal ROM, No JVD. No tracheal deviation. No thyromegaly. CVS: RRR, S1/S2 +, no murmurs, no gallops, no carotid bruit.  Pulmonary: Effort and breath sounds normal, no stridor, rhonchi, wheezes, rales.  Abdominal: Soft. BS +, no distension, tenderness, rebound or guarding.  Musculoskeletal: Normal range of motion. No edema and no tenderness.  Lymphadenopathy: No lymphadenopathy noted, cervical, inguinal or axillary Neuro: Alert. Normal reflexes, muscle tone coordination. No cranial nerve deficit. Skin: Skin is warm and dry. No rash noted. Not diaphoretic. No erythema. No pallor. Psychiatric: Normal mood and affect. Behavior, judgment, thought content normal.  Lab Results  Component Value Date   WBC 4.8 06/12/2015   HGB 13.3 06/12/2015   HCT 40.8 06/12/2015   MCV 93.6 06/12/2015   PLT 158 06/12/2015   Lab Results  Component Value Date    CREATININE 1.34* 06/14/2015   BUN 16 06/14/2015   NA 136 06/14/2015   K 3.3* 06/14/2015   CL 97* 06/14/2015   CO2 32 06/14/2015    Lab Results  Component Value Date   HGBA1C 11.50 05/06/2015   Lipid Panel     Component Value Date/Time   CHOL 167 11/20/2014 0420   TRIG 85 11/20/2014 0420   HDL 55 11/20/2014 0420   CHOLHDL 3.0 11/20/2014 0420   VLDL 17 11/20/2014 0420   LDLCALC 95 11/20/2014 0420       Assessment and plan:     The patient was given clear instructions to go to ER or return to medical center if symptoms don't improve, worsen or new problems develop. The patient verbalized understanding. The patient was told to call to get lab results if they haven't heard anything in the next week.     Russell Shaggy, MD. [facility] [phone] 06/20/2015, 2:41 PM

## 2015-06-20 NOTE — Progress Notes (Signed)
Patient following up after recent hospitalization He states he feels better but his left chronic foot pain is 5/10 and wakes him up some nights He would like refills on his Tylenol #3 He states his pedal edema is the same and he cannot spread his toes apart in order to wash them effectively He states that while at [facility] someone "cut some of the dead skin" of his foot When asked about his podiatry appointment today patient said he didn't know anything about that appointment Patient reports "losing" his medications somewhere in his apartment.  He only has his gabapentin and his Tylenol #3

## 2015-06-20 NOTE — Patient Instructions (Signed)
Continue with daily dressing changes. Monitor for any signs/symptoms of infection. Call the office immediately if any occur or go directly to the emergency room. Call with any questions/concerns.  

## 2015-06-21 NOTE — Progress Notes (Signed)
Quick Note:  Please inform the patient that labs are normal. Thank you. ______ 

## 2015-06-22 NOTE — Progress Notes (Signed)
[facility]  PCP:  [facility]  Admit date: 06/12/15 Discharge date: 06/14/15  Date of telephone encounter: 06/16/15   Russell Hamilton, is a 74 y.o. male  [id number]  [id number]:[id number]  DOB - [birth year]  CC:  Chief Complaint  Patient presents with  . Follow-up  . Diabetes       HPI: Russell Hamilton is a 74 y.o. male 74 year old male with medical history notable for uncontrolled Type 2 DM (A1c 11.5 from 05/2014), diabetic foot ulcer, CHF, CAD (who has declined cardiac cath), non compliance with medical management, COPD, dizziness secondary to hypotension recently hospitalized for CHF exacerbation.  He presented to the ED at [facility] with worsening shortness of breath and was found to be in acute respiratory failure with hypoxia secondary to acute on chronic congestive heart failure. Oxygen saturation was 100% on room air on presentation, blood pressure was 146/101, he had an elevated BNP of 1498 and troponin of 0.04. Chest x-ray demonstrated enlargement of cardiac silhouette with pulmonary vascular congestion and improved CHF, bibasilar effusions and atelectasis. EKG revealed LVH, LAD, stable lateral T-wave inversion; he was placed on IV diuretics with subsequent loss of 10 pounds from the time of admission. He had a slight bump in his creatinine secondary to the diuresis.  He also had his left foot also evaluated by wound care and subsequently had Xeroform gauze applied to wound bed. For his diabetes glimepiride was discontinued and he was placed on sliding scale insulin. Symptoms improved and he was subsequently discharged.  Interval history: He has no complaints today and is not here with all of his medications because he could not locate them. He endorses pedal edema as well as left foot pain. Patient has No headache, No chest pain, No abdominal pain - No Nausea, No Cough - SOB.  No Known Allergies Past Medical History  Diagnosis Date  . Hypertension   . Chronic systolic  CHF (congestive heart failure)     a. 2D ECHO 08/13/14: EF 40%, diffuse hypokinesis possibly worse in the inferior wall. Mild MR, mild LA dilation, PA pressure 50. Trivial pericardial effusion.  . Acute systolic CHF (congestive heart failure), NYHA class 4 08/12/2014  . Type II diabetes mellitus   . GERD (gastroesophageal reflux disease)   . Peripheral neuropathy     [name] 08/12/2014  . Stroke 2010    "memory problems since; right leg and left arm weakness since" (02/08/2015)  . Ulcers of both lower extremities     [name] 02/24/2015  . Noncompliance     [name] 02/24/2015  . COPD (chronic obstructive pulmonary disease)    Current Outpatient Prescriptions on File Prior to Visit  Medication Sig Dispense Refill  . acetaminophen-codeine (TYLENOL #3) 300-30 MG per tablet Take 1 tablet by mouth every 8 (eight) hours as needed for moderate pain. 30 tablet 0  . gabapentin (NEURONTIN) 300 MG capsule Take 2 capsules (600 mg total) by mouth 3 (three) times daily. 90 capsule 2  . aspirin 81 MG chewable tablet Chew 1 tablet (81 mg total) by mouth daily. (Patient not taking: Reported on 06/20/2015) 30 tablet 3  . carvedilol (COREG) 3.125 MG tablet Take 1 tablet (3.125 mg total) by mouth 2 (two) times daily with a meal. (Patient not taking: Reported on 06/20/2015) 60 tablet 2  . ferrous sulfate 325 (65 FE) MG tablet Take 1 tablet (325 mg total) by mouth 2 (two) times daily with a meal. (Patient not taking: Reported on 06/20/2015) 30  tablet 0  . furosemide (LASIX) 40 MG tablet Take 1 tablet (40 mg total) by mouth daily. (Patient not taking: Reported on 06/20/2015) 30 tablet 1  . isosorbide mononitrate (IMDUR) 30 MG 24 hr tablet Take 1 tablet (30 mg total) by mouth daily. (Patient not taking: Reported on 06/20/2015) 30 tablet 2  . lisinopril (PRINIVIL,ZESTRIL) 2.5 MG tablet Take 1 tablet (2.5 mg total) by mouth daily. (Patient not taking: Reported on 06/20/2015) 30 tablet 2  . sitaGLIPtin (JANUVIA) 50 MG tablet Take 1  tablet (50 mg total) by mouth daily. For diabetes (Patient not taking: Reported on 06/20/2015) 30 tablet 3   No current facility-administered medications on file prior to visit.   Family History  Problem Relation Age of Onset  . Kidney disease Mother   . Heart attack Brother     X 2  . Cancer Brother    Social History   Social History  . Marital Status: Married    Spouse Name: N/A  . Number of Children: N/A  . Years of Education: N/A   Occupational History  . Not on file.   Social History Main Topics  . Smoking status: Current Every Day Smoker -- 0.50 packs/day for 56 years    Types: Cigarettes  . Smokeless tobacco: Never Used  . Alcohol Use: No     Comment: 02/08/2015 "I might have a beer q 6 months"  . Drug Use: Yes    Special: Marijuana     Comment: last marijuana use 05/22/15  . Sexual Activity: Not Currently   Other Topics Concern  . Not on file   Social History Narrative    Review of Systems  General: negative for fever, weight loss, appetite change Eyes: blurry vision ENT: no ear symptoms, no sinus tenderness, no nasal congestion or sore throat. Neck: no pain  Respiratory: no wheezing, shortness of breath, cough Cardiovascular: no chest pain, no dyspnea on exertion, + pedal edema, no orthopnea. Gastrointestinal: no abdominal pain, no diarrhea, no constipation Genito-Urinary: no urinary frequency, no dysuria, no polyuria. Hematologic: no bruising Endocrine: no cold or heat intolerance Neurological: no headaches, no seizures, no tremors Musculoskeletal: pain in feet Skin: see hpi Psychological: no depression, no anxiety,    Objective:   Filed Vitals:   06/20/15 1408  BP: 115/67  Pulse: 94  Temp: 97.8 F (36.6 C)   Physical exam: Constitutional: normal appearing,  HEENT: Head is atraumatic, normal sinuses, normal oropharynx, normal appearing tonsils and palate, tympanic membrane is normal bilaterally. Neck: normal range of motion, no thyromegaly, no  JVD Cardiovascular: mildly tachycardic rate, normal rhythm, normal heart sounds, no murmurs, rub or gallop, unable to palpate dorsalis pedis due to pedal edema. Respiratory: clear to auscultation bilaterally, no wheezes, no rales, no rhonchi Abdomen: soft, not tender to palpation, normal bowel sounds, no enlarged organs Extremities: Bilateral nonpitting pedal edema, dorsum is not tender to touch. Skin: Multiple sores on the dorsum of the feet some have dried and of the foot with pain with no discharge; left foot lateral ulcer healing well with no necrotic tissue Neurological: alert, oriented x3, cranial nerves I-XII grossly intact , normal motor strength, normal sensation. Psychological: normal mood.  Lab Results  Component Value Date   WBC 4.8 06/12/2015   HGB 13.3 06/12/2015   HCT 40.8 06/12/2015   MCV 93.6 06/12/2015   PLT 158 06/12/2015   Lab Results  Component Value Date   CREATININE 1.34* 06/14/2015   BUN 16 06/14/2015   NA 136  06/14/2015   K 3.3* 06/14/2015   CL 97* 06/14/2015   CO2 32 06/14/2015    Lab Results  Component Value Date   HGBA1C 11.50 05/06/2015   Lipid Panel     Component Value Date/Time   CHOL 167 11/20/2014 0420   TRIG 85 11/20/2014 0420   HDL 55 11/20/2014 0420   CHOLHDL 3.0 11/20/2014 0420   VLDL 17 11/20/2014 0420   LDLCALC 95 11/20/2014 0420       Assessment and plan:   74 year old male with medical history notable for uncontrolled Type 2 DM (A1c 11.5 from 05/2014), diabetic foot ulcer, CHF, CAD (who has declined cardiac cath), non compliance with medical management, COPD, dizziness secondary to hypotension recently hospitalized for CHF exacerbation.  Hypertension: Controlled All antihypertensives currently on hold due to hypotension and dizziness He remains on Lasix. If at his next office visit his pedal edema has improved I will cut back on the dose of his Lasix and restart one of his antihypertensives at a low dose  Diabetic foot  ulcer: Has an appointment with the wound care clinic today.  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 Due to his visual symptoms I do not think he will be a good candidate for insulin and also given he is refusing home health services. He does not have any of his diabetic medications with him and so I have refilled Januvia as well as glimepiride.  Tinea pedis: Continue Lamisil.  Left foot ulcer: Advised to wear loose fitting shoes; keep appointment with podiatry Dressing change with dry gauze performed in the clinic. Tylenol #3 for pain.  Congestive heart failure: Ejection fraction of 30-35% from 2-D echo of 02/2015 No evidence of acute decompensation at this time. Continue Lasix.  I have had an extensive discussion with the patient again today regarding the fact that he cannot keep up with his medications and medical care would need a home nurse or family member and he has adamantly refused. The patient was given clear instructions to go to ER or return to medical center if symptoms don't improve, worsen or new problems develop. The patient verbalized understanding. The patient was told to call to get lab results if they haven't heard anything in the next week.     Jaclyn Shaggy, MD. [facility] [phone] 06/20/2015, 2:41 PM

## 2015-06-24 ENCOUNTER — Ambulatory Visit: Payer: Medicare Other | Admitting: Family Medicine

## 2015-06-30 ENCOUNTER — Ambulatory Visit: Payer: Medicare Other | Admitting: Family Medicine

## 2015-07-01 ENCOUNTER — Telehealth: Payer: Self-pay

## 2015-07-01 NOTE — Telephone Encounter (Signed)
This Case Manager placed call to patient to check on status and to remind him of appointment on 07/04/15 at 1445 with Dr. Venetia Night. Patient aware of appointment but indicates his money is "very tight" because he is on a fixed income. Patient needing transportation to appointment.  Esmond Plants, Clinic Scheduler, updated. She indicated she would schedule patient's ride to his appointment. Inquired if patient picked up medications that Dr. Venetia Night refilled on 06/20/15. Patient indicated he did. Stressed importance of medication compliance. Patient had Podiatrist appointment on 06/20/15. Inquired if patient doing Silvadene dressings daily as recommended by Dr. Ardelle Anton. Patient indicates he is doing the Silvadene dressings daily as recommended and that the "swelling is going down" in his feet and toes. He also indicated he "can move his toes again." He indicates he occasionally has difficulty washing his feet. Patient indicates he will be at [facility] follow-up appointment on 07/04/15. No additional needs identified.

## 2015-07-04 ENCOUNTER — Ambulatory Visit: Payer: Medicare Other | Attending: Family Medicine | Admitting: Family Medicine

## 2015-07-04 ENCOUNTER — Encounter (HOSPITAL_BASED_OUTPATIENT_CLINIC_OR_DEPARTMENT_OTHER): Payer: Medicare Other | Admitting: Clinical

## 2015-07-04 ENCOUNTER — Encounter: Payer: Self-pay | Admitting: Family Medicine

## 2015-07-04 VITALS — BP 139/91 | HR 85 | Temp 97.5°F | Ht 69.5 in | Wt 150.0 lb

## 2015-07-04 DIAGNOSIS — I509 Heart failure, unspecified: Secondary | ICD-10-CM | POA: Insufficient documentation

## 2015-07-04 DIAGNOSIS — L97529 Non-pressure chronic ulcer of other part of left foot with unspecified severity: Secondary | ICD-10-CM | POA: Diagnosis not present

## 2015-07-04 DIAGNOSIS — E1165 Type 2 diabetes mellitus with hyperglycemia: Secondary | ICD-10-CM | POA: Insufficient documentation

## 2015-07-04 DIAGNOSIS — E1122 Type 2 diabetes mellitus with diabetic chronic kidney disease: Secondary | ICD-10-CM | POA: Diagnosis not present

## 2015-07-04 DIAGNOSIS — N189 Chronic kidney disease, unspecified: Secondary | ICD-10-CM

## 2015-07-04 DIAGNOSIS — Z9119 Patient's noncompliance with other medical treatment and regimen: Secondary | ICD-10-CM | POA: Diagnosis not present

## 2015-07-04 DIAGNOSIS — J449 Chronic obstructive pulmonary disease, unspecified: Secondary | ICD-10-CM | POA: Insufficient documentation

## 2015-07-04 DIAGNOSIS — G629 Polyneuropathy, unspecified: Secondary | ICD-10-CM

## 2015-07-04 DIAGNOSIS — Z658 Other specified problems related to psychosocial circumstances: Secondary | ICD-10-CM

## 2015-07-04 DIAGNOSIS — I10 Essential (primary) hypertension: Secondary | ICD-10-CM | POA: Insufficient documentation

## 2015-07-04 DIAGNOSIS — F17210 Nicotine dependence, cigarettes, uncomplicated: Secondary | ICD-10-CM | POA: Diagnosis not present

## 2015-07-04 DIAGNOSIS — B353 Tinea pedis: Secondary | ICD-10-CM

## 2015-07-04 DIAGNOSIS — Z79899 Other long term (current) drug therapy: Secondary | ICD-10-CM | POA: Diagnosis not present

## 2015-07-04 DIAGNOSIS — L97522 Non-pressure chronic ulcer of other part of left foot with fat layer exposed: Secondary | ICD-10-CM

## 2015-07-04 DIAGNOSIS — E11621 Type 2 diabetes mellitus with foot ulcer: Secondary | ICD-10-CM | POA: Diagnosis not present

## 2015-07-04 DIAGNOSIS — G3183 Dementia with Lewy bodies: Secondary | ICD-10-CM

## 2015-07-04 DIAGNOSIS — I2510 Atherosclerotic heart disease of native coronary artery without angina pectoris: Secondary | ICD-10-CM | POA: Diagnosis not present

## 2015-07-04 DIAGNOSIS — I5022 Chronic systolic (congestive) heart failure: Secondary | ICD-10-CM

## 2015-07-04 DIAGNOSIS — K219 Gastro-esophageal reflux disease without esophagitis: Secondary | ICD-10-CM | POA: Diagnosis not present

## 2015-07-04 DIAGNOSIS — F0280 Dementia in other diseases classified elsewhere without behavioral disturbance: Secondary | ICD-10-CM

## 2015-07-04 LAB — GLUCOSE, POCT (MANUAL RESULT ENTRY): POC GLUCOSE: 168 mg/dL — AB (ref 70–99)

## 2015-07-04 MED ORDER — GLIMEPIRIDE 2 MG PO TABS: 2.0000 mg | ORAL_TABLET | Freq: Every day | ORAL | Status: AC

## 2015-07-04 MED ORDER — SITAGLIPTIN PHOSPHATE 50 MG PO TABS: 50.0000 mg | ORAL_TABLET | Freq: Every day | ORAL | Status: AC

## 2015-07-04 MED ORDER — FUROSEMIDE 40 MG PO TABS: 40.0000 mg | ORAL_TABLET | Freq: Every day | ORAL | Status: DC

## 2015-07-04 MED ORDER — LISINOPRIL 2.5 MG PO TABS: 2.5000 mg | ORAL_TABLET | Freq: Every day | ORAL | Status: DC

## 2015-07-04 MED ORDER — GABAPENTIN 300 MG PO CAPS: 600.0000 mg | ORAL_CAPSULE | Freq: Three times a day (TID) | ORAL | Status: DC

## 2015-07-04 MED ORDER — FERROUS SULFATE 325 (65 FE) MG PO TABS: 325.0000 mg | ORAL_TABLET | Freq: Two times a day (BID) | ORAL | Status: DC

## 2015-07-04 NOTE — Progress Notes (Signed)
[facility]  Subjective:    Patient ID: Russell Hamilton, male    DOB: Jul 04, 1941, 74 y.o.   MRN: [id number]  HPI 74 year old male with medical history notable for uncontrolled Type 2 DM (A1c 11.5 from 05/2014), diabetic foot ulcer, CHF, CAD (who has declined cardiac cath), non compliance with medical management, COPD who comes in today for follow-up visit. He does not have his medications with him and is still unable to locate them but endorses taking gabapentin, Tylenol 3 and Lasix. He has not been taking his Diabetic meds. Has been attending the Podiatry clinic as he has diabetic foot ulcers. Complains of burning pain in feet worse at night; Gabapentin and Tylenol #3 take the edge off.  Past Medical History  Diagnosis Date  . Hypertension   . Chronic systolic CHF (congestive heart failure)     a. 2D ECHO 08/13/14: EF 40%, diffuse hypokinesis possibly worse in the inferior wall. Mild MR, mild LA dilation, PA pressure 50. Trivial pericardial effusion.  . Acute systolic CHF (congestive heart failure), NYHA class 4 08/12/2014  . Type II diabetes mellitus   . GERD (gastroesophageal reflux disease)   . Peripheral neuropathy     [name] 08/12/2014  . Stroke 2010    "memory problems since; right leg and left arm weakness since" (02/08/2015)  . Ulcers of both lower extremities     [name] 02/24/2015  . Noncompliance     [name] 02/24/2015  . COPD (chronic obstructive pulmonary disease)     Past Surgical History  Procedure Laterality Date  . Carotid endarterectomy Right 2010  . Hand ligament reconstruction Right ?1970's    "pinky"    Social History   Social History  . Marital Status: Married    Spouse Name: N/A  . Number of Children: N/A  . Years of Education: N/A   Occupational History  . Not on file.   Social History Main Topics  . Smoking status: Current Every Day Smoker -- 0.50 packs/day for 56 years    Types: Cigarettes  . Smokeless tobacco: Never Used  . Alcohol Use: No      Comment: 02/08/2015 "I might have a beer q 6 months"  . Drug Use: Yes    Special: Marijuana     Comment: last marijuana use 07/03/15  . Sexual Activity: Not Currently   Other Topics Concern  . Not on file   Social History Narrative    No Known Allergies  Current Outpatient Prescriptions on File Prior to Visit  Medication Sig Dispense Refill  . acetaminophen-codeine (TYLENOL #3) 300-30 MG per tablet Take 1 tablet by mouth every 8 (eight) hours as needed for moderate pain. 40 tablet 1  . aspirin 81 MG chewable tablet Chew 1 tablet (81 mg total) by mouth daily. 30 tablet 3  . silver sulfADIAZINE (SILVADENE) 1 % cream Apply 1 application topically daily. 50 g 0  . carvedilol (COREG) 3.125 MG tablet Take 1 tablet (3.125 mg total) by mouth 2 (two) times daily with a meal. (Patient not taking: Reported on 06/20/2015) 60 tablet 2  . isosorbide mononitrate (IMDUR) 30 MG 24 hr tablet Take 1 tablet (30 mg total) by mouth daily. (Patient not taking: Reported on 06/20/2015) 30 tablet 2   No current facility-administered medications on file prior to visit.    Review of Systems General: negative for fever, weight loss, appetite change Eyes: blurry vision ENT: no ear symptoms, no sinus tenderness, no nasal congestion or sore throat. Neck: no  pain  Respiratory: no wheezing, shortness of breath, cough Cardiovascular: no chest pain, no dyspnea on exertion, + pedal edema, no orthopnea. Gastrointestinal: no abdominal pain, no diarrhea, no constipation Genito-Urinary: no urinary frequency, no dysuria, no polyuria. Hematologic: no bruising Endocrine: no cold or heat intolerance Neurological: no headaches, no seizures, no tremors, +numbness in feet. Musculoskeletal: pain in feet Skin: see hpi Psychological: no depression, no anxiety,      Objective: Filed Vitals:   07/04/15 1431  BP: 139/91  Pulse: 85  Temp: 97.5 F (36.4 C)  Height: 5' 9.5" (1.765 m)  Weight: 150 lb (68.04 kg)  SpO2: 98%        Physical Exam Constitutional: normal appearing,  Neck: normal range of motion, no thyromegaly, no JVD Cardiovascular: mildly tachycardic rate, normal rhythm, normal heart sounds, no murmurs, rub or gallop, unable to palpate dorsalis pedis due to pedal edema. Respiratory: clear to auscultation bilaterally, no wheezes, no rales, no rhonchi Abdomen: soft, not tender to palpation, normal bowel sounds, no enlarged organs Extremities: Bilateral nonpitting 1+pedal edema, dorsum is not tender to touch. Skin: Multiple scars on the dorsum of the feet more distal lesions on right foot still weeping;  left foot lateral ulcer healing well with no necrotic tissue Neurological: alert, oriented x3, cranial nerves I-XII grossly intact , normal motor strength, reduced sensation in feet. Psychological: normal mood.  CMP Latest Ref Rng 06/20/2015 06/14/2015 06/13/2015  Glucose 65 - 99 mg/dL 960(A) 540(J) 811(B)  BUN 7 - 25 mg/dL 9 16 9   Creatinine 0.70 - 1.18 mg/dL 1.47 8.29(F) 6.21(H)  Sodium 135 - 146 mmol/L 135 136 138  Potassium 3.5 - 5.3 mmol/L 3.9 3.3(L) 3.8  Chloride 98 - 110 mmol/L 98 97(L) 98(L)  CO2 20 - 31 mmol/L 30 32 31  Calcium 8.6 - 10.3 mg/dL 8.8 0.8(M) 5.7(Q)  Total Protein 6.0 - 8.3 g/dL - - -  Total Bilirubin 0.3 - 1.2 mg/dL - - -  Alkaline Phos 39 - 117 U/L - - -  AST 0 - 37 U/L - - -  ALT 0 - 53 U/L - - -          Assessment & Plan:  74 year old male with medical history notable for uncontrolled Type 2 DM (A1c 11.5 from 05/2014), diabetic foot ulcer, CHF, CAD (who has declined cardiac cath), non compliance with medical management, COPD, dizziness secondary to hypotension recently hospitalized for CHF exacerbation.  Hypertension: Controlled Restarted Lisinopril as BP is no longer in the Hypotensive range.   Diabetic foot ulcers: Healing Keep appointment with Podiatry.  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 Due to his visual symptoms I do not think he will be a good candidate for insulin and also given he is refusing home health services. He does not have any of his diabetic medications with him and so I have refilled Januvia as well as glimepiride.  Tinea pedis: Continue Lamisil.  Left foot ulcer: Advised to wear loose fitting shoes; keep appointment with podiatry Dressing change with dry gauze performed in the clinic. Tylenol #3 for pain.  Congestive heart failure: Ejection fraction of 30-35% from 2-D echo of 02/2015 No evidence of acute decompensation at this time. Continue Lasix. Beta blocker. Hydralazine and Imdur on hold due to hypotension.  Dementia: Discussed issue of power of attorney with the patient which is resistant to due to previous  bad experience. LCSW called in to speak with the patient to discuss other available options to assist with medication compliance.

## 2015-07-04 NOTE — Progress Notes (Signed)
Patient here to follow up on his DM2 and hypotension He complains of bilateral leg pain 10/10 that awakens him at night that is throbbing in nature Patient saw podiatrist recently and has been changing his dressing and uses his silvadene

## 2015-07-04 NOTE — Progress Notes (Signed)
ASSESSMENT: Pt currently experiencing psychosocial stressors and would benefit from community resources and supportive counseling regarding coping with psychosocial stressors.  Stage of Change: precontemplative  PLAN: 1. F/U with behavioral health consultant in as needed 2. Psychiatric Medications: none. 3. Behavioral recommendation(s):   -Accept referral to [facility] -Call [facility] to inquire about veteran transportation at [phone]  SUBJECTIVE: Pt. referred by Dr Venetia Night for community resources:  Pt. reports the following symptoms/concerns: Pt states that he does not want to go to a nursing home, that he takes his medicines as prescribed, but cannot find at least one of his meds "probably in that one place I haven't looked yet", and sometimes does not know why he is taking the meds. Pt says he would agree to have a monthly delivery of his meds and that he wants to know how to get ahold of the [employer] in [place]. He says he feels safe in his current home; does not want to live with friends or family at this time.  Duration of problem: recent Severity: mild  OBJECTIVE: Orientation & Cognition: Oriented x3. Thought processes normal and appropriate to situation. Mood: appropriate. Affect: appropriate Appearance: appropriate Risk of harm to self or others: no risk of harm to self or others Substance use: tobacco Assessments administered: PHQ2: 0  Diagnosis: Problem related to psychosocial circumstances CPT Code: Z65.9 -------------------------------------------- Other(s) present in the room: none  Time spent with patient in exam room: 20 minutes

## 2015-07-04 NOTE — Patient Instructions (Signed)
Diabetes Mellitus and Food It is important for you to manage your blood sugar (glucose) level. Your blood glucose level can be greatly affected by what you eat. Eating healthier foods in the appropriate amounts throughout the day at about the same time each day will help you control your blood glucose level. It can also help slow or prevent worsening of your diabetes mellitus. Healthy eating may even help you improve the level of your blood pressure and reach or maintain a healthy weight.  HOW CAN FOOD AFFECT ME? Carbohydrates Carbohydrates affect your blood glucose level more than any other type of food. Your dietitian will help you determine how many carbohydrates to eat at each meal and teach you how to count carbohydrates. Counting carbohydrates is important to keep your blood glucose at a healthy level, especially if you are using insulin or taking certain medicines for diabetes mellitus. Alcohol Alcohol can cause sudden decreases in blood glucose (hypoglycemia), especially if you use insulin or take certain medicines for diabetes mellitus. Hypoglycemia can be a life-threatening condition. Symptoms of hypoglycemia (sleepiness, dizziness, and disorientation) are similar to symptoms of having too much alcohol.  If your health care provider has given you approval to drink alcohol, do so in moderation and use the following guidelines:  Women should not have more than one drink per day, and men should not have more than two drinks per day. One drink is equal to:  12 oz of beer.  5 oz of wine.  1 oz of hard liquor.  Do not drink on an empty stomach.  Keep yourself hydrated. Have water, diet soda, or unsweetened iced tea.  Regular soda, juice, and other mixers might contain a lot of carbohydrates and should be counted. WHAT FOODS ARE NOT RECOMMENDED? As you make food choices, it is important to remember that all foods are not the same. Some foods have fewer nutrients per serving than other  foods, even though they might have the same number of calories or carbohydrates. It is difficult to get your body what it needs when you eat foods with fewer nutrients. Examples of foods that you should avoid that are high in calories and carbohydrates but low in nutrients include:  Trans fats (most processed foods list trans fats on the Nutrition Facts label).  Regular soda.  Juice.  Candy.  Sweets, such as cake, pie, doughnuts, and cookies.  Fried foods. WHAT FOODS CAN I EAT? Have nutrient-rich foods, which will nourish your body and keep you healthy. The food you should eat also will depend on several factors, including:  The calories you need.  The medicines you take.  Your weight.  Your blood glucose level.  Your blood pressure level.  Your cholesterol level. You also should eat a variety of foods, including:  Protein, such as meat, poultry, fish, tofu, nuts, and seeds (lean animal proteins are best).  Fruits.  Vegetables.  Dairy products, such as milk, cheese, and yogurt (low fat is best).  Breads, grains, pasta, cereal, rice, and beans.  Fats such as olive oil, trans fat-free margarine, canola oil, avocado, and olives. DOES EVERYONE WITH DIABETES MELLITUS HAVE THE SAME MEAL PLAN? Because every person with diabetes mellitus is different, there is not one meal plan that works for everyone. It is very important that you meet with a dietitian who will help you create a meal plan that is just right for you. Document Released: 07/19/2005 Document Revised: 10/27/2013 Document Reviewed: 09/18/2013 Exit[employer] Patient Information 2015 [employer]. This   information is not intended to replace advice given to you by your health care provider. Make sure you discuss any questions you have with your health care provider.  

## 2015-07-12 ENCOUNTER — Emergency Department (HOSPITAL_COMMUNITY): Payer: Medicare Other

## 2015-07-12 ENCOUNTER — Encounter (HOSPITAL_COMMUNITY): Payer: Self-pay

## 2015-07-12 ENCOUNTER — Inpatient Hospital Stay (HOSPITAL_COMMUNITY)
Admission: EM | Admit: 2015-07-12 | Discharge: 2015-07-14 | DRG: 292 | Discharge status: Against Medical Advice | Payer: Medicare Other | Attending: Internal Medicine | Admitting: Internal Medicine

## 2015-07-12 DIAGNOSIS — D709 Neutropenia, unspecified: Secondary | ICD-10-CM

## 2015-07-12 DIAGNOSIS — Z8249 Family history of ischemic heart disease and other diseases of the circulatory system: Secondary | ICD-10-CM

## 2015-07-12 DIAGNOSIS — J449 Chronic obstructive pulmonary disease, unspecified: Secondary | ICD-10-CM | POA: Diagnosis present

## 2015-07-12 DIAGNOSIS — F17210 Nicotine dependence, cigarettes, uncomplicated: Secondary | ICD-10-CM | POA: Diagnosis present

## 2015-07-12 DIAGNOSIS — I5043 Acute on chronic combined systolic (congestive) and diastolic (congestive) heart failure: Secondary | ICD-10-CM | POA: Diagnosis not present

## 2015-07-12 DIAGNOSIS — E1140 Type 2 diabetes mellitus with diabetic neuropathy, unspecified: Secondary | ICD-10-CM | POA: Diagnosis present

## 2015-07-12 DIAGNOSIS — R0602 Shortness of breath: Secondary | ICD-10-CM | POA: Diagnosis not present

## 2015-07-12 DIAGNOSIS — E1122 Type 2 diabetes mellitus with diabetic chronic kidney disease: Secondary | ICD-10-CM | POA: Diagnosis present

## 2015-07-12 DIAGNOSIS — I509 Heart failure, unspecified: Secondary | ICD-10-CM

## 2015-07-12 DIAGNOSIS — N183 Chronic kidney disease, stage 3 unspecified: Secondary | ICD-10-CM | POA: Diagnosis present

## 2015-07-12 DIAGNOSIS — K219 Gastro-esophageal reflux disease without esophagitis: Secondary | ICD-10-CM | POA: Diagnosis present

## 2015-07-12 DIAGNOSIS — Z809 Family history of malignant neoplasm, unspecified: Secondary | ICD-10-CM

## 2015-07-12 DIAGNOSIS — I248 Other forms of acute ischemic heart disease: Secondary | ICD-10-CM | POA: Diagnosis present

## 2015-07-12 DIAGNOSIS — I10 Essential (primary) hypertension: Secondary | ICD-10-CM | POA: Diagnosis present

## 2015-07-12 DIAGNOSIS — Z8673 Personal history of transient ischemic attack (TIA), and cerebral infarction without residual deficits: Secondary | ICD-10-CM

## 2015-07-12 DIAGNOSIS — I129 Hypertensive chronic kidney disease with stage 1 through stage 4 chronic kidney disease, or unspecified chronic kidney disease: Secondary | ICD-10-CM | POA: Diagnosis present

## 2015-07-12 LAB — BASIC METABOLIC PANEL
Anion gap: 7 (ref 5–15)
BUN: 5 mg/dL — AB (ref 6–20)
CHLORIDE: 99 mmol/L — AB (ref 101–111)
CO2: 27 mmol/L (ref 22–32)
CREATININE: 1.09 mg/dL (ref 0.61–1.24)
Calcium: 8.4 mg/dL — ABNORMAL LOW (ref 8.9–10.3)
GFR calc Af Amer: >60 mL/min (ref >60)
Glucose, Bld: 158 mg/dL — ABNORMAL HIGH (ref 65–99)
Potassium: 4 mmol/L (ref 3.5–5.1)
Sodium: 133 mmol/L — ABNORMAL LOW (ref 135–145)

## 2015-07-12 LAB — CBC
HCT: 33.1 % — ABNORMAL LOW (ref 39.0–52.0)
Hemoglobin: 10.7 g/dL — ABNORMAL LOW (ref 13.0–17.0)
MCH: 30.7 pg (ref 26.0–34.0)
MCHC: 32.3 g/dL (ref 30.0–36.0)
MCV: 95.1 fL (ref 78.0–100.0)
PLATELETS: 139 10*3/uL — AB (ref 150–400)
RBC: 3.48 MIL/uL — ABNORMAL LOW (ref 4.22–5.81)
RDW: 16.8 % — AB (ref 11.5–15.5)
WBC: 3.2 10*3/uL — ABNORMAL LOW (ref 4.0–10.5)

## 2015-07-12 LAB — BRAIN NATRIURETIC PEPTIDE: B Natriuretic Peptide: 1696 pg/mL — ABNORMAL HIGH (ref 0.0–100.0)

## 2015-07-12 MED ORDER — FUROSEMIDE 10 MG/ML IJ SOLN
60.0000 mg | INTRAMUSCULAR | Status: AC
  Administered 2015-07-12: 60 mg via INTRAVENOUS
  Filled 2015-07-12: qty 6

## 2015-07-12 NOTE — ED Notes (Signed)
EKG delayed, due to previous pt with leads. Previous pt was in CT and X-ray. Other leads had to be located.

## 2015-07-12 NOTE — ED Provider Notes (Signed)
CSN: [id number]     Arrival date & time 07/12/15  2107 History   First MD Initiated Contact with Patient 07/12/15 2135     Chief Complaint  Patient presents with  . Shortness of Breath     (Consider location/radiation/quality/duration/timing/severity/associated sxs/prior Treatment) HPI Comments: 74 year old male with extensive past medical history including systolic CHF, type 2 diabetes mellitus, peripheral neuropathy, COPD, hypertension who presents with shortness of breath. The patient states that for the past several days he has had waxing and waning episodes of shortness of breath. It is worse when he lies flat and he also wakes up short of breath and coughing at night. His cough is productive. He denies any associated fevers. 3 hours ago, his shortness of breath became much worse which is why he presents tonight. He has had intermittent shortness of breath episodes for several months. He states that this feels like previous episodes of CHF exacerbation. He denies any associated vomiting, diarrhea, abdominal pain, or chest pain. No history of blood clots. He is taking all of his medications except for 1 which he cannot locate; he is unsure which medication.  Patient is a 74 y.o. male presenting with shortness of breath. The history is provided by the patient.  Shortness of Breath   Past Medical History  Diagnosis Date  . Hypertension   . Chronic systolic CHF (congestive heart failure)     a. 2D ECHO 08/13/14: EF 40%, diffuse hypokinesis possibly worse in the inferior wall. Mild MR, mild LA dilation, PA pressure 50. Trivial pericardial effusion.  . Acute systolic CHF (congestive heart failure), NYHA class 4 08/12/2014  . Type II diabetes mellitus   . GERD (gastroesophageal reflux disease)   . Peripheral neuropathy     [name] 08/12/2014  . Stroke 2010    "memory problems since; right leg and left arm weakness since" (02/08/2015)  . Ulcers of both lower extremities     [name] 02/24/2015  .  Noncompliance     [name] 02/24/2015  . COPD (chronic obstructive pulmonary disease)    Past Surgical History  Procedure Laterality Date  . Carotid endarterectomy Right 2010  . Hand ligament reconstruction Right ?1970's    "pinky"   Family History  Problem Relation Age of Onset  . Kidney disease Mother   . Heart attack Brother     X 2  . Cancer Brother    Social History  Substance Use Topics  . Smoking status: Current Every Day Smoker -- 0.50 packs/day for 56 years    Types: Cigarettes  . Smokeless tobacco: Never Used  . Alcohol Use: No     Comment: 02/08/2015 "I might have a beer q 6 months"    Review of Systems  Respiratory: Positive for shortness of breath.    10 Systems reviewed and are negative for acute change except as noted in the HPI.   Allergies  Review of patient's allergies indicates no known allergies.  Home Medications   Prior to Admission medications   Medication Sig Start Date End Date Taking? Authorizing Provider  acetaminophen-codeine (TYLENOL #3) 300-30 MG per tablet Take 1 tablet by mouth every 8 (eight) hours as needed for moderate pain. 06/20/15  Yes Jaclyn Shaggy, MD  ferrous sulfate 325 (65 FE) MG tablet Take 1 tablet (325 mg total) by mouth 2 (two) times daily with a meal. 07/04/15  Yes Jaclyn Shaggy, MD  gabapentin (NEURONTIN) 300 MG capsule Take 2 capsules (600 mg total) by mouth 3 (three) times daily. 07/04/15  Yes Jaclyn Shaggy, MD  lisinopril (PRINIVIL,ZESTRIL) 2.5 MG tablet Take 1 tablet (2.5 mg total) by mouth daily. 07/04/15  Yes Jaclyn Shaggy, MD  aspirin 81 MG chewable tablet Chew 1 tablet (81 mg total) by mouth daily. 06/20/15   Jaclyn Shaggy, MD  carvedilol (COREG) 3.125 MG tablet Take 1 tablet (3.125 mg total) by mouth 2 (two) times daily with a meal. Patient not taking: Reported on 06/20/2015 06/14/15   Hollice Espy, MD  furosemide (LASIX) 40 MG tablet Take 1 tablet (40 mg total) by mouth daily. 07/04/15   Jaclyn Shaggy, MD  glimepiride (AMARYL)  2 MG tablet Take 1 tablet (2 mg total) by mouth daily with breakfast. 07/04/15   Jaclyn Shaggy, MD  isosorbide mononitrate (IMDUR) 30 MG 24 hr tablet Take 1 tablet (30 mg total) by mouth daily. Patient not taking: Reported on 06/20/2015 04/19/15   Jaclyn Shaggy, MD  silver sulfADIAZINE (SILVADENE) 1 % cream Apply 1 application topically daily. 06/20/15   Vivi Barrack, DPM  sitaGLIPtin (JANUVIA) 50 MG tablet Take 1 tablet (50 mg total) by mouth daily. For diabetes 07/04/15   Jaclyn Shaggy, MD   BP 143/87 mmHg  Pulse 97  Resp 23  SpO2 93% Physical Exam  Constitutional: He is oriented to person, place, and time. He appears well-developed and well-nourished. No distress.  HENT:  Head: Normocephalic and atraumatic.  Moist mucous membranes  Eyes: Conjunctivae are normal. Pupils are equal, round, and reactive to light.  Neck: Neck supple.  Cardiovascular: Normal rate, regular rhythm and normal heart sounds.   No murmur heard. Pulmonary/Chest: No respiratory distress.  Mildly increased WOB w/ tachypnea, crackles in bases b/l, coarse upper airway sounds  Abdominal: Soft. Bowel sounds are normal. He exhibits no distension. There is no tenderness.  Musculoskeletal:  Trace b/l LE edema to ankles  Neurological: He is alert and oriented to person, place, and time.  Fluent speech  Skin: Skin is warm.  Healed burn wounds on b/l feet  Psychiatric: He has a normal mood and affect. Judgment normal.  Nursing note and vitals reviewed.   ED Course  Procedures (including critical care time) Labs Review Labs Reviewed  BASIC METABOLIC PANEL - Abnormal; Notable for the following:    Sodium 133 (*)    Chloride 99 (*)    Glucose, Bld 158 (*)    BUN 5 (*)    Calcium 8.4 (*)    All other components within normal limits  CBC - Abnormal; Notable for the following:    WBC 3.2 (*)    RBC 3.48 (*)    Hemoglobin 10.7 (*)    HCT 33.1 (*)    RDW 16.8 (*)    Platelets 139 (*)    All other components within  normal limits  BRAIN NATRIURETIC PEPTIDE - Abnormal; Notable for the following:    B Natriuretic Peptide 1696.0 (*)    All other components within normal limits  TROPONIN I    Imaging Review Dg Chest 2 View  07/12/2015   CLINICAL DATA:  Intermittent sob and cough x 3 months  EXAM: CHEST  2 VIEW  COMPARISON:  06/12/2015  FINDINGS: Stable cardiac enlargement. Vascular pattern normal. Mild background interstitial prominence similar to prior study. Small bilateral pleural effusions.  IMPRESSION: Congestive heart failure with mild interstitial edema and bilateral pleural effusions.   Electronically Signed   By: Esperanza Heir M.D.   On: 07/12/2015 22:07   I have personally reviewed and evaluated these images and  lab results as part of my medical decision-making.   EKG Interpretation   Date/Time:  Tuesday July 12 2015 21:25:06 EDT Ventricular Rate:  108 PR Interval:  155 QRS Duration: 105 QT Interval:  367 QTC Calculation: 492 R Axis:   -41 Text Interpretation:  Sinus tachycardia Multiform ventricular premature  complexes LVH with secondary repolarization abnormality Anterior Q waves,  possibly due to LVH No significant change since last tracing Confirmed by  Tymeshia Awan MD, Taniela Feltus [id number]) on 07/12/2015 9:57:47 PM     Medications  furosemide (LASIX) injection 60 mg (60 mg Intravenous Given 07/12/15 2325)    MDM   Final diagnoses:  None   acute on chronic CHF Shortness of breath   74 year old male with extensive past medical history including CHF who presents with several days of worsening shortness of breath, particularly at night and when laying flat. At presentation, the patient was awake, alert, and in no acute distress. Vital signs notable for mild hypertension, borderline tachycardia near 100. O2 sat normal on room air. He had crackles in bases and mildly increased work of breathing. Obtained above lab work including troponin and BNP. EKG shows sinus tachycardia but no significant  changes from previous. Chest x-ray shows CHF changes with mild interstitial edema and small bilateral pleural effusions.   BNP elevated at 1696; patient's BNP is chronically elevated but given his findings on chest x-ray and worsening symptoms I suspect that he is having CHF exacerbation.Gave the patient nitropaste and IV lasix. Given waxing and waning symptoms and hx c/w CHF, I feel that PE is unlikely cause of his symptoms. Patient admitted for further care including diuresis.  Laurence Spates, MD 07/12/15 [phone]

## 2015-07-12 NOTE — ED Notes (Signed)
Little, MD at bedside. 

## 2015-07-12 NOTE — ED Notes (Signed)
Patient transported to X-ray with xray tech 

## 2015-07-12 NOTE — ED Notes (Signed)
Per EMS pt states he had a sudden onset of SOB while watching TV; Pt states he has been having episodes x 3 months off and on; Pt denies pain on arrival; Pt a&o x 4 on arrival.

## 2015-07-12 NOTE — ED Notes (Signed)
Pt back in room D33

## 2015-07-12 NOTE — ED Notes (Signed)
Gave pt graham crackers, peanut butter and Diet Ginger Ale, per Gabriel Rung - RN

## 2015-07-12 NOTE — ED Notes (Signed)
Pt states he is having trouble breathing; pt was at 96% on RA; pt told he was doing fine on monitor but will give 2 L/M of 02 for comfort due to COPD; Pt at 100% with Oxygen

## 2015-07-13 ENCOUNTER — Inpatient Hospital Stay (HOSPITAL_COMMUNITY): Payer: Medicare Other

## 2015-07-13 DIAGNOSIS — N189 Chronic kidney disease, unspecified: Secondary | ICD-10-CM

## 2015-07-13 DIAGNOSIS — N183 Chronic kidney disease, stage 3 (moderate): Secondary | ICD-10-CM | POA: Diagnosis present

## 2015-07-13 DIAGNOSIS — Z8673 Personal history of transient ischemic attack (TIA), and cerebral infarction without residual deficits: Secondary | ICD-10-CM | POA: Diagnosis not present

## 2015-07-13 DIAGNOSIS — I10 Essential (primary) hypertension: Secondary | ICD-10-CM | POA: Diagnosis not present

## 2015-07-13 DIAGNOSIS — E1140 Type 2 diabetes mellitus with diabetic neuropathy, unspecified: Secondary | ICD-10-CM | POA: Diagnosis present

## 2015-07-13 DIAGNOSIS — K219 Gastro-esophageal reflux disease without esophagitis: Secondary | ICD-10-CM | POA: Diagnosis present

## 2015-07-13 DIAGNOSIS — I509 Heart failure, unspecified: Secondary | ICD-10-CM

## 2015-07-13 DIAGNOSIS — R0602 Shortness of breath: Secondary | ICD-10-CM | POA: Diagnosis present

## 2015-07-13 DIAGNOSIS — E1122 Type 2 diabetes mellitus with diabetic chronic kidney disease: Secondary | ICD-10-CM | POA: Diagnosis present

## 2015-07-13 DIAGNOSIS — I5023 Acute on chronic systolic (congestive) heart failure: Secondary | ICD-10-CM | POA: Diagnosis not present

## 2015-07-13 DIAGNOSIS — I248 Other forms of acute ischemic heart disease: Secondary | ICD-10-CM

## 2015-07-13 DIAGNOSIS — Z809 Family history of malignant neoplasm, unspecified: Secondary | ICD-10-CM | POA: Diagnosis not present

## 2015-07-13 DIAGNOSIS — D709 Neutropenia, unspecified: Secondary | ICD-10-CM | POA: Diagnosis present

## 2015-07-13 DIAGNOSIS — I5043 Acute on chronic combined systolic (congestive) and diastolic (congestive) heart failure: Principal | ICD-10-CM

## 2015-07-13 DIAGNOSIS — Z8249 Family history of ischemic heart disease and other diseases of the circulatory system: Secondary | ICD-10-CM | POA: Diagnosis not present

## 2015-07-13 DIAGNOSIS — F17210 Nicotine dependence, cigarettes, uncomplicated: Secondary | ICD-10-CM | POA: Diagnosis present

## 2015-07-13 DIAGNOSIS — J438 Other emphysema: Secondary | ICD-10-CM

## 2015-07-13 DIAGNOSIS — I110 Hypertensive heart disease with heart failure: Secondary | ICD-10-CM | POA: Diagnosis not present

## 2015-07-13 DIAGNOSIS — J449 Chronic obstructive pulmonary disease, unspecified: Secondary | ICD-10-CM | POA: Diagnosis present

## 2015-07-13 DIAGNOSIS — I429 Cardiomyopathy, unspecified: Secondary | ICD-10-CM

## 2015-07-13 DIAGNOSIS — I129 Hypertensive chronic kidney disease with stage 1 through stage 4 chronic kidney disease, or unspecified chronic kidney disease: Secondary | ICD-10-CM | POA: Diagnosis present

## 2015-07-13 LAB — GLUCOSE, CAPILLARY
GLUCOSE-CAPILLARY: 168 mg/dL — AB (ref 65–99)
GLUCOSE-CAPILLARY: 163 mg/dL — AB (ref 65–99)
Glucose-Capillary: 163 mg/dL — ABNORMAL HIGH (ref 65–99)
Glucose-Capillary: 171 mg/dL — ABNORMAL HIGH (ref 65–99)
Glucose-Capillary: 200 mg/dL — ABNORMAL HIGH (ref 65–99)

## 2015-07-13 LAB — TROPONIN I: TROPONIN I: 0.09 ng/mL — AB (ref <0.031)

## 2015-07-13 MED ORDER — GABAPENTIN 300 MG PO CAPS: 600.0000 mg | ORAL_CAPSULE | Freq: Three times a day (TID) | ORAL | Status: DC

## 2015-07-13 MED ORDER — INSULIN ASPART 100 UNIT/ML ~~LOC~~ SOLN: 0.0000 [IU] | Freq: Every day | SUBCUTANEOUS | Status: DC

## 2015-07-13 MED ORDER — ACETAMINOPHEN-CODEINE #3 300-30 MG PO TABS: 1.0000 | ORAL_TABLET | Freq: Three times a day (TID) | ORAL | Status: DC | PRN

## 2015-07-13 MED ORDER — ASPIRIN 81 MG PO CHEW: 81.0000 mg | CHEWABLE_TABLET | Freq: Every day | ORAL | Status: DC

## 2015-07-13 MED ORDER — IPRATROPIUM-ALBUTEROL 0.5-2.5 (3) MG/3ML IN SOLN: 3.0000 mL | Freq: Four times a day (QID) | RESPIRATORY_TRACT | Status: DC | PRN

## 2015-07-13 MED ORDER — SILVER SULFADIAZINE 1 % EX CREA
1.0000 "application " | TOPICAL_CREAM | Freq: Every day | CUTANEOUS | Status: DC
  Administered 2015-07-13: 1 via TOPICAL
  Filled 2015-07-13: qty 85

## 2015-07-13 MED ORDER — LINAGLIPTIN 5 MG PO TABS: 5.0000 mg | ORAL_TABLET | Freq: Every day | ORAL | Status: DC

## 2015-07-13 MED ORDER — INSULIN ASPART 100 UNIT/ML ~~LOC~~ SOLN: 0.0000 [IU] | Freq: Three times a day (TID) | SUBCUTANEOUS | Status: DC

## 2015-07-13 MED ORDER — ZOLPIDEM TARTRATE 5 MG PO TABS
5.0000 mg | ORAL_TABLET | Freq: Every evening | ORAL | Status: DC | PRN
Start: 2015-07-13 — End: 2015-07-14
  Administered 2015-07-13: 5 mg via ORAL
  Filled 2015-07-13: qty 1

## 2015-07-13 MED ORDER — SODIUM CHLORIDE 0.9 % IJ SOLN: 3.0000 mL | INTRAMUSCULAR | Status: DC | PRN

## 2015-07-13 MED ORDER — FERROUS SULFATE 325 (65 FE) MG PO TABS
325.0000 mg | ORAL_TABLET | Freq: Two times a day (BID) | ORAL | Status: DC
  Filled 2015-07-13 (×2): qty 1

## 2015-07-13 MED ORDER — LISINOPRIL 2.5 MG PO TABS: 2.5000 mg | ORAL_TABLET | Freq: Every day | ORAL | Status: DC

## 2015-07-13 MED ORDER — ONDANSETRON HCL 4 MG/2ML IJ SOLN: 4.0000 mg | Freq: Four times a day (QID) | INTRAMUSCULAR | Status: DC | PRN

## 2015-07-13 MED ORDER — ACETAMINOPHEN 325 MG PO TABS: 650.0000 mg | ORAL_TABLET | ORAL | Status: DC | PRN

## 2015-07-13 MED ORDER — GABAPENTIN 600 MG PO TABS
600.0000 mg | ORAL_TABLET | Freq: Three times a day (TID) | ORAL | Status: DC
  Administered 2015-07-13: 600 mg via ORAL
  Filled 2015-07-13: qty 1

## 2015-07-13 MED ORDER — PANTOPRAZOLE SODIUM 40 MG PO TBEC: 40.0000 mg | DELAYED_RELEASE_TABLET | Freq: Every day | ORAL | Status: DC

## 2015-07-13 MED ORDER — SODIUM CHLORIDE 0.9 % IJ SOLN
3.0000 mL | Freq: Two times a day (BID) | INTRAMUSCULAR | Status: DC
  Administered 2015-07-13: 3 mL via INTRAVENOUS

## 2015-07-13 MED ORDER — HEPARIN SODIUM (PORCINE) 5000 UNIT/ML IJ SOLN
5000.0000 [IU] | Freq: Three times a day (TID) | INTRAMUSCULAR | Status: DC
Start: 2015-07-13 — End: 2015-07-14

## 2015-07-13 MED ORDER — GLIMEPIRIDE 2 MG PO TABS
2.0000 mg | ORAL_TABLET | Freq: Every day | ORAL | Status: DC
  Filled 2015-07-13 (×3): qty 1

## 2015-07-13 MED ORDER — CARVEDILOL 6.25 MG PO TABS
6.2500 mg | ORAL_TABLET | Freq: Two times a day (BID) | ORAL | Status: DC
  Filled 2015-07-13 (×2): qty 1

## 2015-07-13 MED ORDER — FUROSEMIDE 10 MG/ML IJ SOLN
60.0000 mg | Freq: Once | INTRAMUSCULAR | Status: AC
  Administered 2015-07-13: 60 mg via INTRAVENOUS

## 2015-07-13 MED ORDER — SPIRONOLACTONE 25 MG PO TABS: 25.0000 mg | ORAL_TABLET | Freq: Every day | ORAL | Status: DC

## 2015-07-13 MED ORDER — CARVEDILOL 3.125 MG PO TABS: 3.1250 mg | ORAL_TABLET | Freq: Two times a day (BID) | ORAL | Status: DC

## 2015-07-13 MED ORDER — LISINOPRIL 5 MG PO TABS: 5.0000 mg | ORAL_TABLET | Freq: Every day | ORAL | Status: DC

## 2015-07-13 MED ORDER — SODIUM CHLORIDE 0.9 % IV SOLN: 250.0000 mL | INTRAVENOUS | Status: DC | PRN

## 2015-07-13 MED ORDER — FUROSEMIDE 10 MG/ML IJ SOLN: 40.0000 mg | Freq: Every day | INTRAMUSCULAR | Status: DC

## 2015-07-13 MED ORDER — ASPIRIN EC 325 MG PO TBEC: 325.0000 mg | DELAYED_RELEASE_TABLET | Freq: Every day | ORAL | Status: DC

## 2015-07-13 MED ORDER — INSULIN ASPART 100 UNIT/ML ~~LOC~~ SOLN: 0.0000 [IU] | SUBCUTANEOUS | Status: DC

## 2015-07-13 MED ORDER — ISOSORBIDE MONONITRATE ER 30 MG PO TB24: 30.0000 mg | ORAL_TABLET | Freq: Every day | ORAL | Status: DC

## 2015-07-13 MED ORDER — FUROSEMIDE 10 MG/ML IJ SOLN: 40.0000 mg | Freq: Two times a day (BID) | INTRAMUSCULAR | Status: DC

## 2015-07-13 MED ORDER — FUROSEMIDE 10 MG/ML IJ SOLN
INTRAMUSCULAR | Status: AC
Start: 2015-07-13 — End: 2015-07-14
  Filled 2015-07-13: qty 8

## 2015-07-13 MED ORDER — NITROGLYCERIN 2 % TD OINT: 0.5000 [in_us] | TOPICAL_OINTMENT | Freq: Once | TRANSDERMAL | Status: DC

## 2015-07-13 NOTE — Hospital Discharge Follow-Up (Signed)
This patient is known to the [facility] and has been followed in the [facility] ( [facility]).  Met with him this afternoon and he agrees to continue to follow up at the [facility].  He has an appointment scheduled for 07/18/15 @ 1400 and the information was added to the AVS.    Update provided to Olga Coaster, CM.

## 2015-07-13 NOTE — Progress Notes (Signed)
Pt is agitated and complaining that he has not had any sleep all night. Pt states that he does not want to be bothered and he is to tired to do anything. Pt is also refusing all medication at this time. Dr. Thedore Mins made aware.

## 2015-07-13 NOTE — Progress Notes (Signed)
Patient Demographics:    Russell Hamilton, is a 74 y.o. male, DOB - 28-Sep-1941, RUE:[id number]  Admit date - 07/12/2015   Admitting Physician Yevonne Pax, MD  Outpatient Primary MD for the patient is Ambrose Finland, NP  LOS - 0   Chief Complaint  Patient presents with  . Shortness of Breath        Subjective:    Russell Hamilton today has, No headache, No chest pain, No abdominal pain - No Nausea, No new weakness tingling or numbness, No Cough - improved shortness of breath.   Assessment  & Plan :     1. 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 Neurology on board, continue IV Lasix, Aldactone added, on Imdur, ACE inhibitor, along with Coreg. Continue daily weight, monitor intake and output.  2. Mildly elevated troponin due to demand ischemia from #1 above. No chest pain, EKG nonacute, continue aspirin, beta blocker and Imdur. Cardiology offered patient stress test which he refused.  3. Chronic kidney disease stage II. Baseline creatinine around 1. Stable for now.  4. COPD. No wheezing, supportive care.  5. Essential hypertension. Blood pressure stable on combination of Coreg, Imdur, diuretic along with added Aldactone. Continue to monitor.  6. DM type II. On Amaryl and sliding scale. We will monitor.  Lab Results  Component Value Date   HGBA1C 11.50 05/06/2015    CBG (last 3)   Recent Labs  07/13/15 0154 07/13/15 0438 07/13/15 0803  GLUCAP 163* 171* 168*        Code Status : Full  Family Communication  : None present  Disposition Plan  : To be decided  Consults  :  Cardiology  Procedures  :   Echogram from April 2016  - Left ventricle: The cavity size was mildly dilated.  Wallthickness was increased in a pattern of mild LVH. Systolicfunction was moderately to severely reduced. The estimatedejection fraction was in the range of 30% to 35%. Diffusehypokinesis. Doppler parameters are consistent with abnormal leftventricular relaxation (grade 1 diastolic dysfunction). Dopplerparameters are consistent with high ventricular filling pressure. - Aortic valve: Valve area (VTI): 1.33 cm^2. Valve area (Vmax): 1.3cm^2. Valve area (Vmean): 1.23 cm^2. - Mitral valve: Calcified annulus. Valve area by continuityequation (using LVOT flow): 1.68 cm^2. - Left atrium: The atrium was moderately dilated. - Right atrium: The atrium was moderately dilated. - Tricuspid valve: There was moderate regurgitation. - Pulmonary arteries: PA peak pressure: 43 mm Hg (S). - Pericardium, extracardiac: A trivial pericardial effusion wasidentified.  DVT Prophylaxis  : Heparin    Lab Results  Component Value Date   PLT 139* 07/12/2015    Inpatient Medications  Scheduled Meds: . aspirin EC  325 mg Oral Daily  . carvedilol  6.25 mg Oral BID WC  . ferrous sulfate  325 mg Oral BID WC  . furosemide  40 mg Intravenous Q12H  . gabapentin  600 mg Oral TID  . glimepiride  2 mg Oral Q breakfast  . heparin  5,000 Units Subcutaneous 3 times per day  . insulin aspart  0-15 Units Subcutaneous 6 times per day  . isosorbide mononitrate  30 mg Oral Daily  .  linagliptin  5 mg Oral Daily  . [START ON 07/14/2015] lisinopril  5 mg Oral Daily  . pantoprazole  40 mg Oral Daily  . silver sulfADIAZINE  1 application Topical Daily  . sodium chloride  3 mL Intravenous Q12H  . spironolactone  25 mg Oral Daily   Continuous Infusions:  PRN Meds:.sodium chloride, acetaminophen, acetaminophen-codeine, ipratropium-albuterol, ondansetron (ZOFRAN) IV, sodium chloride, zolpidem  Antibiotics  :     Anti-infectives    None        Objective:   Filed Vitals:   07/13/15 0100 07/13/15 0137 07/13/15 0440  07/13/15 1045  BP: 142/86 135/78 139/87 153/99  Pulse: 100 97 97 106  Temp:  98 F (36.7 C) 98.1 F (36.7 C) 98.1 F (36.7 C)  TempSrc:  Oral Oral Oral  Resp: Height:  5' 9.5" (1.765 m)    Weight:  68.448 kg (150 lb 14.4 oz)    SpO2: 100% 97% 95% 96%    Wt Readings from Last 3 Encounters:  07/13/15 68.448 kg (150 lb 14.4 oz)  07/04/15 68.04 kg (150 lb)  06/20/15 66.497 kg (146 lb 9.6 oz)     Intake/Output Summary (Last 24 hours) at 07/13/15 1322 Last data filed at 07/13/15 0902  Gross per 24 hour  Intake    820 ml  Output   1420 ml  Net   -600 ml     Physical Exam  Awake Alert, Oriented X 3, No new F.N deficits, agitated affect [place].AT,PERRAL Supple Neck,No JVD, No cervical lymphadenopathy appriciated.  Symmetrical Chest wall movement, Good air movement bilaterally, few rales RRR,No Gallops,Rubs or new Murmurs, No Parasternal Heave +ve B.Sounds, Abd Soft, No tenderness, No organomegaly appriciated, No rebound - guarding or rigidity. No Cyanosis, Clubbing or edema, No new Rash or bruise, chronic bilateral foot wounds mostly on the right leg attributes that to a heating pad injury    Data Review:   Micro Results No results found for this or any previous visit (from the past 240 hour(s)).  Radiology Reports Dg Chest 2 View  07/12/2015   CLINICAL DATA:  Intermittent sob and cough x 3 months  EXAM: CHEST  2 VIEW  COMPARISON:  06/12/2015  FINDINGS: Stable cardiac enlargement. Vascular pattern normal. Mild background interstitial prominence similar to prior study. Small bilateral pleural effusions.  IMPRESSION: Congestive heart failure with mild interstitial edema and bilateral pleural effusions.   Electronically Signed   By: Esperanza Heir M.D.   On: 07/12/2015 22:07     CBC  Recent Labs Lab 07/12/15 2220  WBC 3.2*  HGB 10.7*  HCT 33.1*  PLT 139*  MCV 95.1  MCH 30.7  MCHC 32.3  RDW 16.8*    Chemistries   Recent Labs Lab 07/12/15 2220  NA  133*  K 4.0  CL 99*  CO2 27  GLUCOSE 158*  BUN 5*  CREATININE 1.09  CALCIUM 8.4*   ------------------------------------------------------------------------------------------------------------------ estimated creatinine clearance is 59.3 mL/min (by C-G formula based on Cr of 1.09). ------------------------------------------------------------------------------------------------------------------ No results for input(s): HGBA1C in the last 72 hours. ------------------------------------------------------------------------------------------------------------------ No results for input(s): CHOL, HDL, LDLCALC, TRIG, CHOLHDL, LDLDIRECT in the last 72 hours. ------------------------------------------------------------------------------------------------------------------ No results for input(s): TSH, T4TOTAL, T3FREE, THYROIDAB in the last 72 hours.  Invalid input(s): FREET3 ------------------------------------------------------------------------------------------------------------------ No results for input(s): VITAMINB12, FOLATE, FERRITIN, TIBC, IRON, RETICCTPCT in the last 72 hours.  Coagulation profile No results for input(s): INR, PROTIME in the last 168 hours.  No results for input(s): DDIMER  in the last 72 hours.  Cardiac Enzymes  Recent Labs Lab 07/12/15 2327  TROPONINI 0.09*   ------------------------------------------------------------------------------------------------------------------ Invalid input(s): POCBNP   Time Spent in minutes   35   SINGH,PRASHANT K M.D on 07/13/2015 at 1:22 PM  Between 7am to 7pm - Pager - [phone]  After 7pm go to [email or web address] - password [facility]  [facility] -  Office  [phone]

## 2015-07-13 NOTE — Progress Notes (Signed)
PT Cancellation Note  Patient Details Name: Russell Hamilton MRN: [id number] DOB: [birth year]   Cancelled Treatment:    Reason Eval/Treat Not Completed: Patient at procedure or test/unavailable; patient in process of Echocardiogram.  Will attempt tomorrow.   Brittiany Wiehe,CYNDI 07/13/2015, 4:04 PM  Sheran Lawless, PT [phone] 07/13/2015

## 2015-07-13 NOTE — Progress Notes (Signed)
Utilization review completed. Kenroy Timberman, RN, BSN. 

## 2015-07-13 NOTE — ED Notes (Signed)
Admitting MD at bedside.

## 2015-07-13 NOTE — Progress Notes (Signed)
Echocardiogram 2D Echocardiogram has been performed.  Jarreau, [place] 07/13/2015, 4:16 PM

## 2015-07-13 NOTE — Progress Notes (Signed)
Pt refused blood drawn and some medications, pt advised and educated but still continue to refused. Will continue to monitor pt

## 2015-07-13 NOTE — Consult Note (Signed)
CARDIOLOGY CONSULT NOTE   Patient ID: Daegan Arizmendi MRN: [id number], DOB/AGE: [birth year]   Admit date: 07/12/2015 Date of Consult: 07/13/2015  Primary Physician: Ambrose Finland, NP Primary Cardiologist: Dr Delton See  Reason for consult:  CHF  Problem List  Past Medical History  Diagnosis Date  . Hypertension   . Chronic systolic CHF (congestive heart failure)     a. 2D ECHO 08/13/14: EF 40%, diffuse hypokinesis possibly worse in the inferior wall. Mild MR, mild LA dilation, PA pressure 50. Trivial pericardial effusion.  . Acute systolic CHF (congestive heart failure), NYHA class 4 08/12/2014  . Type II diabetes mellitus   . GERD (gastroesophageal reflux disease)   . Peripheral neuropathy     [name] 08/12/2014  . Stroke 2010    "memory problems since; right leg and left arm weakness since" (02/08/2015)  . Ulcers of both lower extremities     [name] 02/24/2015  . Noncompliance     [name] 02/24/2015  . COPD (chronic obstructive pulmonary disease)     Past Surgical History  Procedure Laterality Date  . Carotid endarterectomy Right 2010  . Hand ligament reconstruction Right ?1970's    "pinky"    Allergies  No Known Allergies  HPI   Kristy Catoe is a 74 y.o. male with a history of stroke, hypertension, diabetes type 2, peripheral neuropathy, with known combined systolic and diastolic CHF, recently admitted to [facility] for an acute exacerbation (8/8-810/16).  Patient recently moved from [place] to [place] to be closer to family. He reports that he has hx of hypertension and diabetes, which were well controlled in the past and therefore he stopped taking medications. "[facility] that was no longer hypertensive and did not need medications anymore." He has not been on any BP meds for over 2 years. He ran out of his other medications including metformin, gabapentin and ASA about a month ago. He has never been followed by a cardiologist.  He was also hospitalized on August 13, 2014  for acute systolic CHF. He has a family history of CAD in sister and brother who both have had interventions in their 54's and 58s respectively. He also has a long history of heavy tobacco abuse. In the ED he was found to have elevated d-dimer. CTA negative for PE. Chest x-ray showed bilateral pleural effusion and pulmonary edema. ProBNP 7427. 2D ECHO in 02/2015 showed EF 30-25% with diffuse hypokinesis possibly worse in the inferior wall. Mild MR, mild LA dilation, PA pressure 50. Trivial pericardial effusion. When discussed the option of a cardiac cath or stress test, he very strongly refused. He feels that if it cannot be treated medically then it is his time to go.  He was seen by me in the clinic on 06/17/15 when he felt better after iv diuresis at the hospital, discharged on lasix 40 mg po daily.  He denied orthopnea, PND, but had residual LE edema. No chest pain.  He presented to the ED yesterday presents with increased shortness of breath. Patient states that he has been having increased shortness of breath for a couple of hours. Patient states that he has had noted cough with some clear sputum. There is no fever or chills noted. He has no chest pain he does have leg pain related to his diabetic neuropathy. He states that he has had increased orthopnea and has not been sleeping well at all. Patient states that he has not been taking coreg and imdur. Patient  states that he noted some dizziness today when he was being seen by EMS. He states that he has had no palpitationsn noted. He states that he was complaint with his meds.   Inpatient Medications  . aspirin EC  325 mg Oral Daily  . carvedilol  3.125 mg Oral BID WC  . ferrous sulfate  325 mg Oral BID WC  . furosemide  40 mg Intravenous Daily  . gabapentin  600 mg Oral TID  . glimepiride  2 mg Oral Q breakfast  . heparin  5,000 Units Subcutaneous 3 times per day  . insulin aspart  0-15 Units Subcutaneous 6 times per day  . isosorbide  mononitrate  30 mg Oral Daily  . linagliptin  5 mg Oral Daily  . lisinopril  2.5 mg Oral Daily  . pantoprazole  40 mg Oral Daily  . silver sulfADIAZINE  1 application Topical Daily  . sodium chloride  3 mL Intravenous Q12H    Family History Family History  Problem Relation Age of Onset  . Kidney disease Mother   . Heart attack Brother     X 2  . Cancer Brother      Social History Social History   Social History  . Marital Status: Married    Spouse Name: N/A  . Number of Children: N/A  . Years of Education: N/A   Occupational History  . Not on file.   Social History Main Topics  . Smoking status: Current Every Day Smoker -- 0.50 packs/day for 56 years    Types: Cigarettes  . Smokeless tobacco: Never Used  . Alcohol Use: No     Comment: 02/08/2015 "I might have a beer q 6 months"  . Drug Use: Yes    Special: Marijuana     Comment: last marijuana use 07/03/15  . Sexual Activity: Not Currently   Other Topics Concern  . Not on file   Social History Narrative     Review of Systems  General:  No chills, fever, night sweats or weight changes.  Cardiovascular:  No chest pain, dyspnea on exertion, edema, orthopnea, palpitations, paroxysmal nocturnal dyspnea. Dermatological: No rash, lesions/masses Respiratory: No cough, dyspnea Urologic: No hematuria, dysuria Abdominal:   No nausea, vomiting, diarrhea, bright red blood per rectum, melena, or hematemesis Neurologic:  No visual changes, wkns, changes in mental status. All other systems reviewed and are otherwise negative except as noted above.  Physical Exam  Blood pressure 153/99, pulse 106, temperature 98.1 F (36.7 C), temperature source Oral, resp. rate 18, height 5' 9.5" (1.765 m), weight 150 lb 14.4 oz (68.448 kg), SpO2 96 %.  General: Pleasant, NAD Psych: Normal affect. Neuro: Alert and oriented X 3. Moves all extremities spontaneously. HEENT: Normal  Neck: Supple without bruits, JVDs + 8 cm B/L Lungs:  Resp  regular and unlabored,  Rales at both bases. Heart: RRR no s3, s4, or murmurs. Abdomen: Soft, non-tender, non-distended, BS + x 4.  Extremities: No clubbing, cyanosis, mild B/L non-pitting edema. DP/PT/Radials 2+ and equal bilaterally.  Labs   Recent Labs  07/12/15 2327  TROPONINI 0.09*   Lab Results  Component Value Date   WBC 3.2* 07/12/2015   HGB 10.7* 07/12/2015   HCT 33.1* 07/12/2015   MCV 95.1 07/12/2015   PLT 139* 07/12/2015    Recent Labs Lab 07/12/15 2220  NA 133*  K 4.0  CL 99*  CO2 27  BUN 5*  CREATININE 1.09  CALCIUM 8.4*  GLUCOSE 158*   Lab  Results  Component Value Date   CHOL 167 11/20/2014   HDL 55 11/20/2014   LDLCALC 95 11/20/2014   TRIG 85 11/20/2014   Lab Results  Component Value Date   DDIMER 1.03* 08/12/2014   Radiology/Studies  Dg Chest 2 View  07/12/2015   CLINICAL DATA:  Intermittent sob and cough x 3 months  EXAM: CHEST  2 VIEW  COMPARISON:  06/12/2015  FINDINGS: Stable cardiac enlargement. Vascular pattern normal. Mild background interstitial prominence similar to prior study. Small bilateral pleural effusions.  IMPRESSION: Congestive heart failure with mild interstitial edema and bilateral pleural effusions.   Electronically Signed   By: Esperanza Heir M.D.   On: 07/12/2015 22:07   Echocardiogram - 02/27/2015 - Left ventricle: The cavity size was mildly dilated. Wall thickness was increased in a pattern of mild LVH. Systolic function was moderately to severely reduced. The estimated ejection fraction was in the range of 30% to 35%. Diffuse hypokinesis. Doppler parameters are consistent with abnormal left ventricular relaxation (grade 1 diastolic dysfunction). Doppler parameters are consistent with high ventricular filling pressure. - Aortic valve: Valve area (VTI): 1.33 cm^2. Valve area (Vmax): 1.3 cm^2. Valve area (Vmean): 1.23 cm^2. - Mitral valve: Calcified annulus. Valve area by continuity equation (using LVOT  flow): 1.68 cm^2. - Left atrium: The atrium was moderately dilated. - Right atrium: The atrium was moderately dilated. - Tricuspid valve: There was moderate regurgitation. - Pulmonary arteries: PA peak pressure: 43 mm Hg (S). - Pericardium, extracardiac: A trivial pericardial effusion was identified.   ECG: Sinus tachycardia, LVH and repolarization abnormalities, but possible lateral ischemia, unchanged from 05/10/2015    ASSESSMENT AND PLAN  1. Acute on chronic systolic CHF exacerbation-  - multiple readmissions in the last year, also Troponin elevation, this is most probably demand ischemia sec to CHF - I would increase lasix iv 40 mg to BID and follow crea closely - I will also increase dosages of lisinopril and carvedilol, lisinopril to 5 mg po daily and carvedilol to 6.25 mg po daily - I will add spironolactone 25 mg po daily  2. Troponin elevation - as above  3. Cardiomyopathy - the etiology is unknown, most probably chronic untreated HTN, however multiple admission with elevated troponin and inferior wall hypokinesis, again he was offered a stress test, but he states "that he doesn't care and is not interested". We will treat medically. Add statin to his regimen.   4. HTN  - increase lisinopril to 5 mg po daily and carvedilol to 6.25 mg po daily,  - add spironolactone 25 mg po daily  5. Tobacco abuse- counseled on cessation   6. History of stroke  -- on ASA    Signed, Lars Masson, MD, [facility] 07/13/2015, 11:48 AM
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
## 2015-07-14 ENCOUNTER — Encounter (HOSPITAL_COMMUNITY): Payer: Self-pay | Admitting: General Practice

## 2015-07-14 LAB — GLUCOSE, CAPILLARY: Glucose-Capillary: 151 mg/dL — ABNORMAL HIGH (ref 65–99)

## 2015-07-14 LAB — HEMOGLOBIN A1C
Hgb A1c MFr Bld: 8.2 % — ABNORMAL HIGH (ref 4.8–5.6)
Mean Plasma Glucose: 189 mg/dL

## 2015-07-14 NOTE — Progress Notes (Signed)
Patient refusing all medications at this time. Patient also repeatedly saying "I wanna go home. Let me go home." Patient continues to refuse all nursing care and wants to leave against medical advise. Dr. Thedore Mins made aware.

## 2015-07-14 NOTE — Progress Notes (Signed)
Patient taken out of hospital via wheelchair.  

## 2015-07-14 NOTE — Discharge Summary (Signed)
AMA  Patient at this time expresses desire to leave the Hospital immidiately, patient has been warned that this is not Medically advisable at this time, and can result in Medical complications like Death and Disability, patient understands and accepts the risks involved and assumes full responsibilty of this decision.   [name] M.D on 07/14/2015 at 9:24 AM  [facility]    Last Note Below                                                                                                     Patient Demographics:    Russell Hamilton, is a 74 y.o. male, DOB - [birth year], UJW:[id number]  Admit date - 07/12/2015   Admitting Physician Yevonne Pax, MD  Outpatient Primary MD for the patient is Ambrose Finland, NP  LOS - 1   Chief Complaint  Patient presents with  . Shortness of Breath        Subjective:    Russell Hamilton today has, No headache, No chest pain, No abdominal pain - No Nausea, No new weakness tingling or numbness, No Cough - improved shortness of breath.   Assessment  & Plan :     1. 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 Neurology on board, continue IV Lasix, Aldactone added, on Imdur, ACE inhibitor, along with Coreg. Continue daily weight, monitor intake and output.  2. Mildly elevated troponin due to demand ischemia from #1 above. No chest pain, EKG nonacute, continue aspirin, beta blocker and Imdur. Cardiology offered patient stress test which he refused.  3. Chronic kidney disease stage II. Baseline creatinine around 1. Stable for now.  4. COPD. No wheezing, supportive care.  5. Essential hypertension. Blood pressure stable on  combination of Coreg, Imdur, diuretic along with added Aldactone. Continue to monitor.  6. DM type II. On Amaryl and sliding scale. We will monitor.  Lab Results  Component Value Date   HGBA1C 8.2* 07/13/2015    CBG (last 3)   Recent Labs  07/13/15 1632 07/13/15 2149 07/14/15 0646  GLUCAP 200* 163* 151*        Code Status : Full  Family Communication  : None present  Disposition Plan  : To be decided  Consults  :  Cardiology  Procedures  :   Echogram from April 2016  - Left ventricle: The cavity size was mildly dilated. Wallthickness was increased in a pattern of mild LVH. Systolicfunction was moderately to severely reduced. The estimatedejection fraction was in the range of 30% to 35%. Diffusehypokinesis. Doppler parameters are consistent with abnormal leftventricular relaxation (grade 1 diastolic dysfunction). Dopplerparameters are consistent with high ventricular filling pressure. - Aortic valve: Valve area (VTI): 1.33 cm^2. Valve area (Vmax): 1.3cm^2. Valve area (Vmean): 1.23 cm^2. - Mitral valve: Calcified annulus. Valve area by continuityequation (using LVOT flow): 1.68 cm^2. - Left atrium: The atrium was moderately dilated. - Right atrium: The atrium was moderately dilated. - Tricuspid valve: There was moderate regurgitation. - Pulmonary arteries: PA peak pressure: 43 mm Hg (S). - Pericardium, extracardiac: A trivial pericardial effusion wasidentified.  DVT Prophylaxis  : Heparin    Lab Results  Component Value Date   PLT 139* 07/12/2015    Inpatient Medications  Scheduled Meds: . aspirin EC  325 mg Oral Daily  . carvedilol  6.25 mg Oral BID WC  . ferrous sulfate  325 mg Oral BID WC  . furosemide  40 mg Intravenous Q12H  . gabapentin  600 mg Oral TID  . glimepiride  2 mg Oral Q breakfast  . heparin  5,000 Units Subcutaneous 3 times per day  . insulin aspart  0-5 Units Subcutaneous QHS  . insulin aspart  0-9 Units Subcutaneous TID WC  .  isosorbide mononitrate  30 mg Oral Daily  . linagliptin  5 mg Oral Daily  . lisinopril  5 mg Oral Daily  . nitroGLYCERIN  0.5 inch Topical Once  . pantoprazole  40 mg Oral Daily  . silver sulfADIAZINE  1 application Topical Daily  . sodium chloride  3 mL Intravenous Q12H  . spironolactone  25 mg Oral Daily   Continuous Infusions:  PRN Meds:.sodium chloride, acetaminophen, acetaminophen-codeine, ipratropium-albuterol, ondansetron (ZOFRAN) IV, sodium chloride, zolpidem  Antibiotics  :     Anti-infectives    None        Objective:   Filed Vitals:   07/13/15 1045 07/13/15 1427 07/13/15 2148 07/14/15 0640  BP: 153/99 142/83 157/97 137/85  Pulse: 106 114 116 109  Temp: 98.1 F (36.7 C) 97.7 F (36.5 C) 98 F (36.7 C) 97.9 F (36.6 C)  TempSrc: Oral Oral Oral Oral  Resp: 18 18 20 20   Height:      Weight:    64.547 kg (142 lb 4.8 oz)  SpO2: 96% 95% 92% 90%    Wt Readings from Last 3 Encounters:  07/14/15 64.547 kg (142 lb 4.8 oz)  07/04/15 68.04 kg (150 lb)  06/20/15 66.497 kg (146 lb 9.6 oz)     Intake/Output Summary (Last 24 hours) at 07/14/15 0924 Last data filed at 07/14/15 0600  Gross per 24 hour  Intake    540 ml  Output    895 ml  Net   -355 ml     Physical Exam  Awake Alert, Oriented X 3, No new F.N deficits, agitated affect [place].AT,PERRAL Supple Neck,No JVD, No cervical lymphadenopathy appriciated.  Symmetrical Chest wall movement, Good air movement bilaterally, few rales RRR,No Gallops,Rubs or new Murmurs, No Parasternal Heave +ve B.Sounds, Abd Soft, No tenderness, No organomegaly appriciated, No rebound - guarding or rigidity. No Cyanosis, Clubbing or edema, No new Rash or bruise, chronic bilateral foot wounds mostly on the right leg  attributes that to a heating pad injury    Data Review:   Micro Results No results found for this or any previous visit (from the past 240 hour(s)).  Radiology Reports Dg Chest 2 View  07/12/2015   CLINICAL DATA:   Intermittent sob and cough x 3 months  EXAM: CHEST  2 VIEW  COMPARISON:  06/12/2015  FINDINGS: Stable cardiac enlargement. Vascular pattern normal. Mild background interstitial prominence similar to prior study. Small bilateral pleural effusions.  IMPRESSION: Congestive heart failure with mild interstitial edema and bilateral pleural effusions.   Electronically Signed   By: Esperanza Heir M.D.   On: 07/12/2015 22:07     CBC  Recent Labs Lab 07/12/15 2220  WBC 3.2*  HGB 10.7*  HCT 33.1*  PLT 139*  MCV 95.1  MCH 30.7  MCHC 32.3  RDW 16.8*    Chemistries   Recent Labs Lab 07/12/15 2220  NA 133*  K 4.0  CL 99*  CO2 27  GLUCOSE 158*  BUN 5*  CREATININE 1.09  CALCIUM 8.4*   ------------------------------------------------------------------------------------------------------------------ estimated creatinine clearance is 55.9 mL/min (by C-G formula based on Cr of 1.09). ------------------------------------------------------------------------------------------------------------------  Recent Labs  07/13/15 1414  HGBA1C 8.2*   ------------------------------------------------------------------------------------------------------------------ No results for input(s): CHOL, HDL, LDLCALC, TRIG, CHOLHDL, LDLDIRECT in the last 72 hours. ------------------------------------------------------------------------------------------------------------------ No results for input(s): TSH, T4TOTAL, T3FREE, THYROIDAB in the last 72 hours.  Invalid input(s): FREET3 ------------------------------------------------------------------------------------------------------------------ No results for input(s): VITAMINB12, FOLATE, FERRITIN, TIBC, IRON, RETICCTPCT in the last 72 hours.  Coagulation profile No results for input(s): INR, PROTIME in the last 168 hours.  No results for input(s): DDIMER in the last 72 hours.  Cardiac Enzymes  Recent Labs Lab 07/12/15 2327  TROPONINI 0.09*    ------------------------------------------------------------------------------------------------------------------ Invalid input(s): POCBNP   Time Spent in minutes   35   Azriella Mattia K M.D on 07/14/2015 at 9:24 AM  Between 7am to 7pm - Pager - [phone]  After 7pm go to [email or web address] - password [facility]  [facility] -  Office  [phone]

## 2015-07-14 NOTE — Progress Notes (Signed)
Dr. Thedore Mins in room. Patient addiment about leaving. Leaving against medical advice papers signed.

## 2015-07-15 ENCOUNTER — Ambulatory Visit (INDEPENDENT_AMBULATORY_CARE_PROVIDER_SITE_OTHER): Payer: Medicare Other | Admitting: Podiatry

## 2015-07-15 ENCOUNTER — Encounter: Payer: Self-pay | Admitting: Podiatry

## 2015-07-15 ENCOUNTER — Telehealth: Payer: Self-pay

## 2015-07-15 VITALS — BP 188/102 | HR 94 | Resp 18

## 2015-07-15 DIAGNOSIS — L84 Corns and callosities: Secondary | ICD-10-CM | POA: Diagnosis not present

## 2015-07-15 DIAGNOSIS — L97529 Non-pressure chronic ulcer of other part of left foot with unspecified severity: Secondary | ICD-10-CM

## 2015-07-15 DIAGNOSIS — G629 Polyneuropathy, unspecified: Secondary | ICD-10-CM | POA: Diagnosis not present

## 2015-07-15 DIAGNOSIS — I639 Cerebral infarction, unspecified: Secondary | ICD-10-CM | POA: Diagnosis not present

## 2015-07-15 NOTE — Telephone Encounter (Signed)
This Case Manager received return call from patient. Patient left hospital AMA on 07/14/15.  Patient indicates he is feeling "fine."  Patient denies shortness of breath and indicates "swelling is going down in [his] feet."  Discussed importance of medication compliance. Patient verbalized understanding and indicated he was being compliant with medications.  Informed patient that if he experiences shortness of breath again he should go to ED again; thoroughly discussed warning signs as patient left AMA on 07/14/15. Patient verbalized understanding. Reminded patient of appointment on 07/18/15 at 1400 with Holland Commons, NP. Patient verbalized understanding and indicated he would be at appointment. No additional needs identified.

## 2015-07-15 NOTE — Telephone Encounter (Signed)
This Case Manager placed call to patient to check on status as patient left hospital AMA on 07/14/15. Also called to remind him of his appointment on 07/18/15 at 1400 with Holland Commons, NP. Unable to reach patient; voicemail left requesting return call.

## 2015-07-18 ENCOUNTER — Telehealth: Payer: Self-pay

## 2015-07-18 ENCOUNTER — Ambulatory Visit: Payer: Medicare Other | Attending: Internal Medicine | Admitting: Internal Medicine

## 2015-07-18 NOTE — Progress Notes (Signed)
Pt was called twice without responds (2:05 and 2:17)

## 2015-07-18 NOTE — Telephone Encounter (Signed)
This CM spoke to the patient when he was at the [facility] today. He said that he now has his discharge papers from the service and he still does not have any transportation to the [place].  He also said that he has not asked his family members if they would be able to assist with transportation.  CM discussed the [facility] resources with Hulda Marin, CM - [facility].  Call placed to the [facility] # [phone] to inquire about resources for the patient.  A voice mail message was left on the main voice mail requesting a call back to # [phone].

## 2015-07-19 ENCOUNTER — Telehealth: Payer: Self-pay

## 2015-07-19 ENCOUNTER — Encounter: Payer: Self-pay | Admitting: Internal Medicine

## 2015-07-19 NOTE — Telephone Encounter (Signed)
Attempted to contact the patient to check on his status. Voice mail message left requesting a call back to # [phone] or [phone].

## 2015-07-19 NOTE — Progress Notes (Signed)
Patient ID: Russell Hamilton, male   DOB: Aug 03, 1941, 74 y.o.   MRN: [id number] Patient left office before being seen

## 2015-07-20 ENCOUNTER — Telehealth: Payer: Self-pay

## 2015-07-20 NOTE — Telephone Encounter (Signed)
This Case Manager called patient to discuss rescheduling appointment.  Patient agreeable to rescheduling his appointment to 07/25/15 at 1400 but transportation needed to his appointment.  Spoke with Charlett Lango, Practice Manager, who approved of obtaining cab for his appointment on 07/25/15. Esmond Plants, Clinic Scheduler, updated, and she indicated she would arrange transportation.  Patient updated and agreeable.

## 2015-07-21 ENCOUNTER — Encounter: Payer: Self-pay | Admitting: Podiatry

## 2015-07-21 ENCOUNTER — Telehealth: Payer: Self-pay

## 2015-07-21 NOTE — Telephone Encounter (Signed)
Voicemail received from Russell Hamilton from the [employer] [phone] on Mondays, Wednesdays, Fridays) and [phone] on Tuesdays and Thursdays).  Return call made to [facility] Service Office to inquire about available resources for patient. Patient has discharge papers from the service but does not have transportation to the [place]. Voicemail left requesting return call.

## 2015-07-21 NOTE — Progress Notes (Signed)
Patient ID: Russell Hamilton, male   DOB: [birth year], 74 y.o.   MRN: [id number]  Subjective: 74 year old male presents the office today for follow-up evaluation of ulcerations to his left foot. He states that he has been doing well he does notice some clear drainage and sometimes bloody drainage at times. He denies any pus. Denies any surrounding redness or red streaks. He's been continuing with Silvadene dressing changes daily. He has been going to the wound care center. He states he also has an upcoming appointment next week. No other complaints at this time in no acute changes. Denies any systemic complaints as fevers, chills, nausea, vomiting. No calf pain, chest pain, shortness of breath.  Objective: AAO 3, NAD Neurovascular status unchanged. On the plantar aspect left foot submetatarsal 5 with a granular superficial ulceration measuring slightly smaller than last appointment. There is no surrounding erythema, ascending cellulitis, fluctuance, crepitus, malodor, drainage, purulence. There is hyperkeratotic lesions on the right plantar submetatarsal 2 and 3 of the right foot. Upon debridement no underlying ulceration, drainage or other signs of infection. No other open lesions or pre-ulcerative lesions. There is no overlying edema, erythema, increase in warmth to bilateral lower extremities. No pain with calf compression, swelling, warmth, erythema.  Assessment: 74 year old male with left lateral foot wound and right submetatarsal 2 and 3 hyperkeratotic lesions  Plan: -Treatment options discussed including all alternatives, risks, and complications -Wound debrided to healthy, bleeding, granular wound base. Continue with Silvadene dressing changes daily. Recommended continue follow up with wound care center as the arias been following up with them. If he no longer has the wound care center I'll be more than happy to follow him for his wounds. -Hyperkeratotic lesion sharply debrided without  location/bleeding. -Monitor for any clinical signs or symptoms of infection and directed to call the office immediately should any occur or go to the ER. -Recommend follow-up at the wound care center for further treatment unless he desires to follow with me. I encouraged him to call the office in the questions, concerns, change in symptoms.  Ovid Curd, DPM

## 2015-07-22 ENCOUNTER — Telehealth: Payer: Self-pay

## 2015-07-22 NOTE — Telephone Encounter (Signed)
Call back from patient; however, this Case Manager missed call from patient. Additional call placed to patient to remind him of appointment on 07/25/15 at 1400; however, unable to reach patient. Left additional voicemail for patient.

## 2015-07-22 NOTE — Telephone Encounter (Signed)
This Case Manager placed call to check status and to remind of appointment on 07/25/15 at 1400 with Dr. Venetia Night.  Patient needing transportation to his appointment.  Esmond Plants, Clinic Scheduler, aware.  Unable to reach patient; voicemail left requesting return call.

## 2015-07-25 ENCOUNTER — Telehealth: Payer: Self-pay

## 2015-07-25 ENCOUNTER — Ambulatory Visit: Payer: Medicare Other | Attending: Family Medicine | Admitting: Family Medicine

## 2015-07-25 ENCOUNTER — Encounter: Payer: Self-pay | Admitting: Family Medicine

## 2015-07-25 ENCOUNTER — Telehealth: Payer: Self-pay | Admitting: Internal Medicine

## 2015-07-25 VITALS — BP 116/75 | HR 88 | Temp 97.5°F | Ht 69.0 in | Wt 157.0 lb

## 2015-07-25 DIAGNOSIS — G629 Polyneuropathy, unspecified: Secondary | ICD-10-CM | POA: Diagnosis not present

## 2015-07-25 DIAGNOSIS — B353 Tinea pedis: Secondary | ICD-10-CM | POA: Insufficient documentation

## 2015-07-25 DIAGNOSIS — N189 Chronic kidney disease, unspecified: Secondary | ICD-10-CM | POA: Diagnosis not present

## 2015-07-25 DIAGNOSIS — I639 Cerebral infarction, unspecified: Secondary | ICD-10-CM

## 2015-07-25 DIAGNOSIS — E1122 Type 2 diabetes mellitus with diabetic chronic kidney disease: Secondary | ICD-10-CM | POA: Insufficient documentation

## 2015-07-25 DIAGNOSIS — G3183 Dementia with Lewy bodies: Secondary | ICD-10-CM | POA: Diagnosis not present

## 2015-07-25 DIAGNOSIS — I5021 Acute systolic (congestive) heart failure: Secondary | ICD-10-CM | POA: Diagnosis not present

## 2015-07-25 DIAGNOSIS — I10 Essential (primary) hypertension: Secondary | ICD-10-CM

## 2015-07-25 DIAGNOSIS — F0280 Dementia in other diseases classified elsewhere without behavioral disturbance: Secondary | ICD-10-CM

## 2015-07-25 LAB — GLUCOSE, POCT (MANUAL RESULT ENTRY): POC GLUCOSE: 150 mg/dL — AB (ref 70–99)

## 2015-07-25 NOTE — Telephone Encounter (Signed)
Return call received from the patient.  He said that he will be at his appointment this afternoon and he said that he will bring his medications.  He also said that he spoke to Esmond Plants, [facility] scheduler who has arranged his transportation to the clinic.

## 2015-07-25 NOTE — Patient Instructions (Signed)
Diabetes Mellitus and Food It is important for you to manage your blood sugar (glucose) level. Your blood glucose level can be greatly affected by what you eat. Eating healthier foods in the appropriate amounts throughout the day at about the same time each day will help you control your blood glucose level. It can also help slow or prevent worsening of your diabetes mellitus. Healthy eating may even help you improve the level of your blood pressure and reach or maintain a healthy weight.  HOW CAN FOOD AFFECT ME? Carbohydrates Carbohydrates affect your blood glucose level more than any other type of food. Your dietitian will help you determine how many carbohydrates to eat at each meal and teach you how to count carbohydrates. Counting carbohydrates is important to keep your blood glucose at a healthy level, especially if you are using insulin or taking certain medicines for diabetes mellitus. Alcohol Alcohol can cause sudden decreases in blood glucose (hypoglycemia), especially if you use insulin or take certain medicines for diabetes mellitus. Hypoglycemia can be a life-threatening condition. Symptoms of hypoglycemia (sleepiness, dizziness, and disorientation) are similar to symptoms of having too much alcohol.  If your health care provider has given you approval to drink alcohol, do so in moderation and use the following guidelines:  Women should not have more than one drink per day, and men should not have more than two drinks per day. One drink is equal to:  12 oz of beer.  5 oz of wine.  1 oz of hard liquor.  Do not drink on an empty stomach.  Keep yourself hydrated. Have water, diet soda, or unsweetened iced tea.  Regular soda, juice, and other mixers might contain a lot of carbohydrates and should be counted. WHAT FOODS ARE NOT RECOMMENDED? As you make food choices, it is important to remember that all foods are not the same. Some foods have fewer nutrients per serving than other  foods, even though they might have the same number of calories or carbohydrates. It is difficult to get your body what it needs when you eat foods with fewer nutrients. Examples of foods that you should avoid that are high in calories and carbohydrates but low in nutrients include:  Trans fats (most processed foods list trans fats on the Nutrition Facts label).  Regular soda.  Juice.  Candy.  Sweets, such as cake, pie, doughnuts, and cookies.  Fried foods. WHAT FOODS CAN I EAT? Have nutrient-rich foods, which will nourish your body and keep you healthy. The food you should eat also will depend on several factors, including:  The calories you need.  The medicines you take.  Your weight.  Your blood glucose level.  Your blood pressure level.  Your cholesterol level. You also should eat a variety of foods, including:  Protein, such as meat, poultry, fish, tofu, nuts, and seeds (lean animal proteins are best).  Fruits.  Vegetables.  Dairy products, such as milk, cheese, and yogurt (low fat is best).  Breads, grains, pasta, cereal, rice, and beans.  Fats such as olive oil, trans fat-free margarine, canola oil, avocado, and olives. DOES EVERYONE WITH DIABETES MELLITUS HAVE THE SAME MEAL PLAN? Because every person with diabetes mellitus is different, there is not one meal plan that works for everyone. It is very important that you meet with a dietitian who will help you create a meal plan that is just right for you. Document Released: 07/19/2005 Document Revised: 10/27/2013 Document Reviewed: 09/18/2013 Exit[employer] Patient Information 2015 [employer]. This   information is not intended to replace advice given to you by your health care provider. Make sure you discuss any questions you have with your health care provider.  

## 2015-07-25 NOTE — Telephone Encounter (Signed)
Attempted to contact patient to advise of scheduled transportation, unable to reach patient,LVM advising him to be ready for pickup at 1:30p to bring him to his appointment.

## 2015-07-25 NOTE — Progress Notes (Signed)
Patient here to follow up on his recent hospital admission] He brought his medications in a bag but is unable to see the labels on the bottles and asked to borrow my ink pen so that he could mark his Lasix so he would know which one it was He is very dizzy today and required the help of a wheelchair once inside the clinic He states his left foot pain is 7/10 constant, aching in nature He states he is taking care of his foot and using his silvadene cream When asked when his next would clinic appointment was he states next month-December

## 2015-07-25 NOTE — Telephone Encounter (Signed)
Call received from Arva Chafe w/ [facility] in [place].  She explained her role as an advocate for veterans.  She noted that the Service Center does not provide any services, only assists veterans with obtaining the services that they deserve.  She said that she can assist the individual fill out forms to submit to the  [place] and she can review the forms to help determine what services the person is eligible for.  She suggested that the patient call her to schedule an appointment to meet with her.  She is in [place] on M/W/F and can be reached at # [phone].  She also provided this CM with the contact # for [employer]. # [phone] and suggested that the patient call that number to inquire about transportation services.  This CM provided the patient with the contact information for [facility] and [facility] transportation when he was at this appointment at [facility] today.  He said that he would follow up and call both numbers.

## 2015-07-25 NOTE — Progress Notes (Signed)
Subjective:  Patient ID: Russell Hamilton, male    DOB: [birth year]  Age: 74 y.o. MRN: [id number]  CC: Follow-up   HPI Russell Hamilton is a 74 year old male with a history of type 2 diabetes mellitus, acute systolic CHF, peripheral neuropathy, diabetic foot ulcer, essential hypertension, dementia who is here to follow-up on his recent hospitalization.  He was hospitalized from 07/13/15 - 07/14/15 left AMA after he had presented with worsening shortness of breath as well as dizziness. His proBNP was 1696.0, troponin was 0.09. He was commenced on IV Lasix; 2-D echo done revealed EF of 10-15%, diffuse hypokinesis, systolic function was severely reduced. CT and she was negative for PE, chest x-ray revealed bilateral pleural effusion and pulmonary edema. He was seen by cardiology and again refused cardiac catheterization. Spironolactone was added to his regimen and his doses of lisinopril and carvedilol increased but during his hospitalization and his systolic blood pressures remained in the 150s. He subsequently left AMA.  Today he complains of dizziness and  pain in his feet which is constant and aching-7/10. He had not had a meal all day except for coffee. He continues to apply his Silvadene cream and is not scheduled to see podiatry until 3 months from now. He is not here with any of his antihypertensives or cardiac medications except Lasix and when I asked what medication he takes for diabetes he does not know. He cannot find the bottles for all other medications. He continues to refuse home nursing services.   Past Medical History  Diagnosis Date  . Hypertension   . Chronic systolic CHF (congestive heart failure)     a. 2D ECHO 08/13/14: EF 40%, diffuse hypokinesis possibly worse in the inferior wall. Mild MR, mild LA dilation, PA pressure 50. Trivial pericardial effusion.  . Acute systolic CHF (congestive heart failure), NYHA class 4 08/12/2014  . Type II diabetes mellitus   . GERD (gastroesophageal reflux  disease)   . Peripheral neuropathy     [name] 08/12/2014  . Stroke 2010    "memory problems since; right leg and left arm weakness since" (02/08/2015)  . Ulcers of both lower extremities     [name] 02/24/2015  . Noncompliance     [name] 02/24/2015  . COPD (chronic obstructive pulmonary disease)     Past Surgical History  Procedure Laterality Date  . Carotid endarterectomy Right 2010  . Hand ligament reconstruction Right ?1970's    "pinky"  . Foot surgery Left     debridement    2016    Social History   Social History  . Marital Status: Married    Spouse Name: N/A  . Number of Children: N/A  . Years of Education: N/A   Occupational History  . Not on file.   Social History Main Topics  . Smoking status: Current Every Day Smoker -- 0.50 packs/day for 56 years    Types: Cigarettes  . Smokeless tobacco: Never Used  . Alcohol Use: No     Comment: 02/08/2015 "I might have a beer q 6 months"  . Drug Use: Yes    Special: Marijuana     Comment: last marijuana use 07/03/15  . Sexual Activity: Not Currently   Other Topics Concern  . Not on file   Social History Narrative   No Known Allergies   Outpatient Prescriptions Prior to Visit  Medication Sig Dispense Refill  . acetaminophen-codeine (TYLENOL #3) 300-30 MG per tablet Take 1 tablet by mouth every 8 (eight) hours as needed  for moderate pain. 40 tablet 1  . ferrous sulfate 325 (65 FE) MG tablet Take 1 tablet (325 mg total) by mouth 2 (two) times daily with a meal. 60 tablet 1  . furosemide (LASIX) 40 MG tablet Take 1 tablet (40 mg total) by mouth daily. 30 tablet 2  . gabapentin (NEURONTIN) 300 MG capsule Take 2 capsules (600 mg total) by mouth 3 (three) times daily. 180 capsule 2  . glimepiride (AMARYL) 2 MG tablet Take 1 tablet (2 mg total) by mouth daily with breakfast. 30 tablet 2  . silver sulfADIAZINE (SILVADENE) 1 % cream Apply 1 application topically daily. 50 g 0  . sitaGLIPtin (JANUVIA) 50 MG tablet Take 1 tablet  (50 mg total) by mouth daily. For diabetes 30 tablet 2  . aspirin 81 MG chewable tablet Chew 1 tablet (81 mg total) by mouth daily. (Patient not taking: Reported on 07/25/2015) 30 tablet 3  . carvedilol (COREG) 3.125 MG tablet Take 1 tablet (3.125 mg total) by mouth 2 (two) times daily with a meal. (Patient not taking: Reported on 06/20/2015) 60 tablet 2  . isosorbide mononitrate (IMDUR) 30 MG 24 hr tablet Take 1 tablet (30 mg total) by mouth daily. (Patient not taking: Reported on 06/20/2015) 30 tablet 2  . lisinopril (PRINIVIL,ZESTRIL) 2.5 MG tablet Take 1 tablet (2.5 mg total) by mouth daily. (Patient not taking: Reported on 07/25/2015) 30 tablet 2   No facility-administered medications prior to visit.    ROS Review of Systems General: negative for fever, weight loss, appetite change Eyes: blurry vision ENT: no ear symptoms, no sinus tenderness, no nasal congestion or sore throat. Neck: no pain  Respiratory: no wheezing, shortness of breath, cough Cardiovascular: no chest pain, no dyspnea on exertion, + pedal edema, no orthopnea. Gastrointestinal: no abdominal pain, no diarrhea, no constipation Genito-Urinary: no urinary frequency, no dysuria, no polyuria. Hematologic: no bruising Endocrine: no cold or heat intolerance Neurological: no headaches, no seizures, no tremors, +numbness in feet, +dizziness Musculoskeletal: pain in feet Skin: see hpi  Objective:  BP 116/75 mmHg  Pulse 88  Temp(Src) 97.5 F (36.4 C)  Ht  (1.753 m)  Wt 157 lb (71.215 kg)  BMI 23.17 kg/m2  SpO2 95%  BP/Weight 07/25/2015 07/15/2015 07/14/2015  Systolic BP 116 188 137  Diastolic BP 75 102 85  Wt. (Lbs) 157 - 142.3  BMI 23.17 - 20.72    Lab Results  Component Value Date   WBC 3.2* 07/12/2015   HGB 10.7* 07/12/2015   HCT 33.1* 07/12/2015   PLT 139* 07/12/2015   GLUCOSE 158* 07/12/2015   CHOL 167 11/20/2014   TRIG 85 11/20/2014   HDL 55 11/20/2014   LDLDIRECT 93 08/12/2014   LDLCALC 95 11/20/2014    ALT 11 12/11/2014   AST 17 12/11/2014   NA 133* 07/12/2015   K 4.0 07/12/2015   CL 99* 07/12/2015   CREATININE 1.09 07/12/2015   BUN 5* 07/12/2015   CO2 27 07/12/2015   TSH 0.707 06/12/2015   INR 1.17 08/12/2014   HGBA1C 8.2* 07/13/2015   MICROALBUR 3.7* 05/06/2015     Physical Exam Constitutional: normal appearing,  Neck: normal range of motion, no thyromegaly, no JVD Cardiovascular: mildly tachycardic rate, normal rhythm, normal heart sounds, no murmurs, rub or gallop, unable to palpate dorsalis pedis due to pedal edema. Respiratory: clear to auscultation bilaterally, no wheezes, no rales, no rhonchi Abdomen: soft, not tender to palpation, normal bowel sounds, no enlarged organs Extremities: Bilateral nonpitting 1+pedal edema,  dorsum is not tender to touch. Skin: Multiple scars on the dorsum of the feet more distal lesions on right foot still weeping;  left foot lateral ulcer healing well with no necrotic tissue Neurological: alert, oriented x3, cranial nerves I-XII grossly intact , normal motor strength, reduced sensation in feet. Psychological: normal mood.   Assessment & Plan:   1. Type 2 diabetes mellitus with diabetic chronic kidney disease Uncontrolled but his A1c has trended down to 8.2 Advised to bring in all his medications at his next visit for proper medication reconciliation - Glucose (CBG) - Basic Metabolic Panel  2. Acute systolic CHF (congestive heart failure), NYHA class 4 EF 10-15% No evidence of fluid overload at this time. Continue Lasix as prescribed. Basic metabolic panel to assess for the need for potassium replacement.  3. Peripheral neuropathy He remains on gabapentin.  4. Essential hypertension Controlled- he seems to be hypotensive (which could explain his dizziness) when he comes into the clinic despite his not taking any of his antihypertensives but during hospitalization his systolic pressures were in the 150  All his antihypertensives  had been on hold at his last visit due to his tendency to be hypotensive. He does not have any of his antihypertensives with him at his visit and I have asked him to bring in all his bottles which will be reviewed at his next visit and will be restarted as his blood pressure tolerates.  5. Dementia with Lewy bodies He continues to refuse home care services at home. We're working towards obtaining the pharmacy that would deliver his medication and pillboxes to enable compliance  6. Tinea pedis of both feet Continue topical antifungal and foot care. Foot care is being performed by podiatry and he has an upcoming appointment in 10/2015  Additional time spent by the pharmacist reviewing all his medications and their indications.   Follow-up: Return in about 1 week (around 08/01/2015) for follow up of dizziness with Dr Venetia Night.   Jaclyn Shaggy MD

## 2015-07-25 NOTE — Telephone Encounter (Signed)
Attempted to contact the patient to remind him of his appointment this afternoon at 1400 and to inquire about his transportation status. Esmond Plants, [facility] scheduler aware that the patient needs transportation

## 2015-07-27 ENCOUNTER — Ambulatory Visit: Payer: Medicare Other | Admitting: Cardiology

## 2015-07-27 ENCOUNTER — Other Ambulatory Visit: Payer: Medicare Other

## 2015-08-01 ENCOUNTER — Telehealth: Payer: Self-pay

## 2015-08-01 NOTE — Telephone Encounter (Signed)
Attempted to contact the patient to confirm his appointment for tomorrow and to check on his status.  Call placed to # [phone] and a voice mail message was left requesting a call back to # [phone] or [phone].

## 2015-08-01 NOTE — Telephone Encounter (Signed)
Call received from the patient and his appointment for tomorrow, 08/02/15 @ 1430 was confirmed. He said that he has transportation but will call the clinic tomorrow morning if he has any trouble with transportation.  Instructed him to bring all of his medications to the clinic with him and he stated that he would.  He also noted that he called the [facility] and has an appt scheduled to meet with the representative on 09/02/15 and she will call him the day prior to the appointment to remind him. He noted that he does not want to call the [facility] about transportation until after his appointment at the [facility].

## 2015-08-02 ENCOUNTER — Telehealth: Payer: Self-pay

## 2015-08-02 ENCOUNTER — Encounter: Payer: Self-pay | Admitting: Family Medicine

## 2015-08-02 ENCOUNTER — Ambulatory Visit: Payer: Medicare Other | Attending: Family Medicine | Admitting: Family Medicine

## 2015-08-02 VITALS — BP 126/86 | HR 100 | Temp 97.5°F | Ht 69.0 in | Wt 160.0 lb

## 2015-08-02 DIAGNOSIS — I5021 Acute systolic (congestive) heart failure: Secondary | ICD-10-CM

## 2015-08-02 DIAGNOSIS — F0280 Dementia in other diseases classified elsewhere without behavioral disturbance: Secondary | ICD-10-CM

## 2015-08-02 DIAGNOSIS — I639 Cerebral infarction, unspecified: Secondary | ICD-10-CM

## 2015-08-02 DIAGNOSIS — H8113 Benign paroxysmal vertigo, bilateral: Secondary | ICD-10-CM | POA: Diagnosis not present

## 2015-08-02 DIAGNOSIS — I10 Essential (primary) hypertension: Secondary | ICD-10-CM | POA: Diagnosis not present

## 2015-08-02 DIAGNOSIS — B353 Tinea pedis: Secondary | ICD-10-CM | POA: Diagnosis not present

## 2015-08-02 DIAGNOSIS — G629 Polyneuropathy, unspecified: Secondary | ICD-10-CM

## 2015-08-02 DIAGNOSIS — G3183 Dementia with Lewy bodies: Secondary | ICD-10-CM

## 2015-08-02 DIAGNOSIS — E1165 Type 2 diabetes mellitus with hyperglycemia: Secondary | ICD-10-CM

## 2015-08-02 DIAGNOSIS — I110 Hypertensive heart disease with heart failure: Secondary | ICD-10-CM | POA: Insufficient documentation

## 2015-08-02 LAB — GLUCOSE, POCT (MANUAL RESULT ENTRY): POC Glucose: 116 mg/dl — AB (ref 70–99)

## 2015-08-02 MED ORDER — FUROSEMIDE 40 MG PO TABS: 40.0000 mg | ORAL_TABLET | Freq: Two times a day (BID) | ORAL | Status: DC

## 2015-08-02 MED ORDER — GABAPENTIN 300 MG PO CAPS: 600.0000 mg | ORAL_CAPSULE | Freq: Three times a day (TID) | ORAL | Status: DC

## 2015-08-02 NOTE — Progress Notes (Signed)
CC:  HPI: Russell Hamilton is a 74 y.o. male with a history of Type 2 DM (Hba1c 8.2), diabetic neuropathy, dementia, HTN, CHF (EF 10-15%), tinea pedis here today for a follow up visit.    I was called by the front office staff because patient had developed dizziness while standing at the check in window. On making him sit and checking his vitals they were all stable and so was his blood sugar but he admitted to not having any meal besides breakfast even though I was seeing him at 2:15 PM. He also stated he had only had 3 hours of sleep and he said "my body is tired". He has had persisting dizziness and has been admitted for hypotension in the recent past; blood pressure has been on the low side and so his antihypertensives have been on hold; at one time he was treated for vertigo with meclizine which he stopped taking. On sitting in the wheelchair for a few minutes he reports feeling better.   He continues to complain of pedal edema was on the left foot and he has been compliant with Lasix 40 mg daily; denies shortness of breath. 2-D echo from 07/13/15 revealed an ejection fraction of 10-15%. Also has burning pain and numbness in his feet and remains on gabapentin for which she is requesting a refill.  No Known Allergies Past Medical History  Diagnosis Date  . Hypertension   . Chronic systolic CHF (congestive heart failure)     a. 2D ECHO 08/13/14: EF 40%, diffuse hypokinesis possibly worse in the inferior wall. Mild MR, mild LA dilation, PA pressure 50. Trivial pericardial effusion.  . Acute systolic CHF (congestive heart failure), NYHA class 4 08/12/2014  . Type II diabetes mellitus   . GERD (gastroesophageal reflux disease)   . Peripheral neuropathy     [name] 08/12/2014  . Stroke 2010    "memory problems since; right leg and left arm weakness since" (02/08/2015)  . Ulcers of both lower extremities     [name] 02/24/2015  . Noncompliance     [name] 02/24/2015  . COPD (chronic obstructive pulmonary  disease)    Current Outpatient Prescriptions on File Prior to Visit  Medication Sig Dispense Refill  . acetaminophen-codeine (TYLENOL #3) 300-30 MG per tablet Take 1 tablet by mouth every 8 (eight) hours as needed for moderate pain. 40 tablet 1  . ferrous sulfate 325 (65 FE) MG tablet Take 1 tablet (325 mg total) by mouth 2 (two) times daily with a meal. 60 tablet 1  . glimepiride (AMARYL) 2 MG tablet Take 1 tablet (2 mg total) by mouth daily with breakfast. 30 tablet 2  . potassium chloride SA (K-DUR,KLOR-CON) 20 MEQ tablet Take 20 mEq by mouth daily.    . silver sulfADIAZINE (SILVADENE) 1 % cream Apply 1 application topically daily. 50 g 0  . sitaGLIPtin (JANUVIA) 50 MG tablet Take 1 tablet (50 mg total) by mouth daily. For diabetes 30 tablet 2  . aspirin 81 MG chewable tablet Chew 1 tablet (81 mg total) by mouth daily. (Patient not taking: Reported on 07/25/2015) 30 tablet 3  . carvedilol (COREG) 3.125 MG tablet Take 1 tablet (3.125 mg total) by mouth 2 (two) times daily with a meal. (Patient not taking: Reported on 06/20/2015) 60 tablet 2  . isosorbide mononitrate (IMDUR) 30 MG 24 hr tablet Take 1 tablet (30 mg total) by mouth daily. (Patient not taking: Reported on 06/20/2015) 30 tablet 2  . lisinopril (PRINIVIL,ZESTRIL) 2.5 MG tablet  Take 1 tablet (2.5 mg total) by mouth daily. (Patient not taking: Reported on 07/25/2015) 30 tablet 2   No current facility-administered medications on file prior to visit.   Family History  Problem Relation Age of Onset  . Kidney disease Mother   . Heart attack Brother     X 2  . Cancer Brother    Social History   Social History  . Marital Status: Married    Spouse Name: N/A  . Number of Children: N/A  . Years of Education: N/A   Occupational History  . Not on file.   Social History Main Topics  . Smoking status: Current Every Day Smoker -- 0.50 packs/day for 56 years    Types: Cigarettes  . Smokeless tobacco: Never Used  . Alcohol Use: No      Comment: 02/08/2015 "I might have a beer q 6 months"  . Drug Use: Yes    Special: Marijuana     Comment: last marijuana use 07/03/15  . Sexual Activity: Not Currently   Other Topics Concern  . Not on file   Social History Narrative    Review of Systems: General: negative for fever, weight loss, appetite change Eyes: blurry vision ENT: no ear symptoms, no sinus tenderness, no nasal congestion or sore throat. Neck: no pain  Respiratory: no wheezing, shortness of breath, cough Cardiovascular: no chest pain, no dyspnea on exertion, + pedal edema, no orthopnea. Gastrointestinal: no abdominal pain, no diarrhea, no constipation Genito-Urinary: no urinary frequency, no dysuria, no polyuria. Hematologic: no bruising Endocrine: no cold or heat intolerance Neurological: no headaches, no seizures, no tremors, +numbness in feet, +dizziness Musculoskeletal: pain in feet Skin: see hpi .    Objective:   Filed Vitals:   08/02/15 1447  BP: 126/86  Pulse: 100  Temp: 97.5 F (36.4 C)    Constitutional: normal appearing,  Neck: normal range of motion, no thyromegaly, no JVD Cardiovascular: mildly tachycardic rate, normal rhythm, normal heart sounds, no murmurs, rub or gallop, unable to palpate dorsalis pedis due to pedal edema. Respiratory: clear to auscultation bilaterally, no wheezes, no rales, no rhonchi Abdomen: soft, not tender to palpation, normal bowel sounds, no enlarged organs Extremities: Bilateral nonpitting 2+pedal edema, dorsum is not tender to touch. Skin: Multiple scars on the dorsum of the feet more distal lesions which have improved compared to his last visit;  left foot lateral ulcer healing well with no necrotic tissue Neurological: alert, oriented x3, cranial nerves I-XII grossly intact , normal motor strength, reduced sensation in feet. Psychological: normal mood.  Lab Results  Component Value Date   WBC 3.2* 07/12/2015   HGB 10.7* 07/12/2015   HCT 33.1* 07/12/2015     MCV 95.1 07/12/2015   PLT 139* 07/12/2015   Lab Results  Component Value Date   CREATININE 1.09 07/12/2015   BUN 5* 07/12/2015   NA 133* 07/12/2015   K 4.0 07/12/2015   CL 99* 07/12/2015   CO2 27 07/12/2015    Lab Results  Component Value Date   HGBA1C 8.2* 07/13/2015   Lipid Panel     Component Value Date/Time   CHOL 167 11/20/2014 0420   TRIG 85 11/20/2014 0420   HDL 55 11/20/2014 0420   CHOLHDL 3.0 11/20/2014 0420   VLDL 17 11/20/2014 0420   LDLCALC 95 11/20/2014 0420      . Assessment and plan:  1. Type 2 diabetes mellitus with diabetic chronic kidney disease Uncontrolled but his A1c has trended down to 8.2  Refilled glimepiride - Glucose (CBG) - Basic Metabolic Panel  2. Acute systolic CHF (congestive heart failure), NYHA class 4 EF 10-15% Persisting pedal edema Lasix increased to  bid  Basic metabolic panel at next visit His BP is unable to tolerate ACEI, beta blocker Will place on Dr Vern Claude schedule for optimization of management as he is more likely to be compliant with a Cardiologist in house.  3. Peripheral neuropathy He remains on gabapentin.  4. Essential hypertension Controlled- he seems to be hypotensive (which could explain his dizziness) when he comes into the clinic despite his not taking any of his antihypertensives but during hospitalization his systolic pressures were in the 150  All his antihypertensives have been on hold due to his tendency to be hypotensive.  5. Dementia with Lewy bodies He continues to refuse home care and nursing services We're working towards obtaining the pharmacy that would deliver his medication and pillboxes to enable compliance  6. Tinea pedis of both feet Continue topical antifungal and foot care. Foot care is being performed by podiatry and he has an upcoming appointment in 10/2015  7. Vertigo placed on meclizine-discussed side effects      Jaclyn Shaggy, MD. [facility] [phone] 08/02/2015, 3:09 PM

## 2015-08-02 NOTE — Telephone Encounter (Signed)
Dr Venetia Night requested that the patient be scheduled for an appt with Dr Daleen Squibb.   An appointment was scheduled with Dr Daleen Squibb for 08/17/15 2 1530 and a follow up appt with Dr Venetia Night was scheduled for the same day, 08/17/15 2 1400.  A call was placed to the patient to inform him of the appointments and a voice mail message was left requesting a call back to # [phone] or [phone].

## 2015-08-02 NOTE — Patient Instructions (Signed)

## 2015-08-02 NOTE — Progress Notes (Signed)
Patient here for follow up Patient in the lobby checking in where he began to feel very dizzy Staff helped him into a wheelchair and brought him back to the room Patient reports pain in his feet that is chronic 1/10 and numb, burning He brought two bags of medication to his appointment One bag of medication he says he is taking and the other bag of medication he states he just found in his house before he came He is taking none of these medications He needs refills on Tylenol 3 and gabapentin

## 2015-08-03 ENCOUNTER — Telehealth: Payer: Self-pay

## 2015-08-03 NOTE — Telephone Encounter (Signed)
This Case Manager spoke with Dr. Venetia Night who indicated patient needs appointment with Dr. Daleen Squibb for optimization of congestive heart failure management. Patient scheduled an appointment on 08/17/15 at 1530 with Dr. Daleen Squibb and a follow-up appointment on 10/12 at 1400 with Dr. Venetia Night. Discussed with patient, and patient agreeable to both appointments. No additional needs identified.

## 2015-08-12 ENCOUNTER — Telehealth: Payer: Self-pay

## 2015-08-12 NOTE — Telephone Encounter (Signed)
This Case Manager placed call to patient to check on patient's status. Patient indicated he was "doing fine." Patient denied shortness of breath and indicated the "swelling was going down in [his] feet." Attempted to review patient's medication list; however, patient indicated he knew what medications he should take. Patient refused assistance with home medication management/reconciliation.   Patient indicated he needed a refill of Tylenol #3 for pain as he indicated he was down to "last 2 pills." Patient indicated he was having pain in both feet. He indicated he is taking his neurontin as prescribed but still experiencing 7/10 pain.  Note to be routed to Dr. Venetia Night and Holland Commons, NP to review and determine if medication could be refilled.   Patient reminded of upcoming appointments on 08/17/15 at 1400 with Dr. Venetia Night and 08/17/15 at 1530 with Holland Commons, NP. Patient aware of both appointments and appreciative of reminder. No additional needs identified. Awaiting response on medication refill.

## 2015-08-16 ENCOUNTER — Telehealth: Payer: Self-pay

## 2015-08-16 NOTE — Telephone Encounter (Signed)
Called pt. And advised him that transportation will be picking him up tomorrow at 1:30p, patient verbalized understanding.

## 2015-08-16 NOTE — Telephone Encounter (Signed)
Called the patient to check on his status and to remind him of his appointment with Dr Venetia Night tomorrow, 08/17/15  and with Dr Daleen Squibb tomorrow at 1530.  He said that he is aware of the appointments and will need transportation. Esmond Plants, [facility] [place] scheduler notified of the need for transportation. The patient stated that he has been taking all of his medications and the "swelling" of his feet is " going down."  He noted that he has not experienced any chest pain but is having occasional shortness of breath,  He said that he has been " coughing a lot " yesterday and today.  Discussed the possible need to go to the ED and he said that he does not need to go to the ED and wants to wait to see the MD tomorrow at [facility].  Instructed him to go to the ED if he is developing any increased coughing, shortness of breath, chest pain, fever and he stated that he understood.

## 2015-08-17 ENCOUNTER — Encounter: Payer: Self-pay | Admitting: Family Medicine

## 2015-08-17 ENCOUNTER — Ambulatory Visit: Payer: Medicare Other | Attending: Family Medicine | Admitting: Family Medicine

## 2015-08-17 ENCOUNTER — Encounter: Payer: Self-pay | Admitting: Cardiology

## 2015-08-17 ENCOUNTER — Ambulatory Visit (HOSPITAL_BASED_OUTPATIENT_CLINIC_OR_DEPARTMENT_OTHER): Payer: Medicare Other | Admitting: Cardiology

## 2015-08-17 VITALS — BP 119/76 | HR 100 | Temp 98.2°F | Resp 16

## 2015-08-17 VITALS — BP 116/82 | HR 99 | Temp 97.5°F | Resp 20 | Ht 69.5 in | Wt 156.0 lb

## 2015-08-17 DIAGNOSIS — E1122 Type 2 diabetes mellitus with diabetic chronic kidney disease: Secondary | ICD-10-CM | POA: Insufficient documentation

## 2015-08-17 DIAGNOSIS — I5021 Acute systolic (congestive) heart failure: Secondary | ICD-10-CM | POA: Insufficient documentation

## 2015-08-17 DIAGNOSIS — E1165 Type 2 diabetes mellitus with hyperglycemia: Secondary | ICD-10-CM | POA: Insufficient documentation

## 2015-08-17 DIAGNOSIS — N189 Chronic kidney disease, unspecified: Secondary | ICD-10-CM

## 2015-08-17 DIAGNOSIS — I5022 Chronic systolic (congestive) heart failure: Secondary | ICD-10-CM | POA: Diagnosis not present

## 2015-08-17 DIAGNOSIS — I10 Essential (primary) hypertension: Secondary | ICD-10-CM

## 2015-08-17 DIAGNOSIS — G3183 Dementia with Lewy bodies: Secondary | ICD-10-CM | POA: Insufficient documentation

## 2015-08-17 DIAGNOSIS — I639 Cerebral infarction, unspecified: Secondary | ICD-10-CM

## 2015-08-17 DIAGNOSIS — L97522 Non-pressure chronic ulcer of other part of left foot with fat layer exposed: Secondary | ICD-10-CM | POA: Diagnosis not present

## 2015-08-17 DIAGNOSIS — F0280 Dementia in other diseases classified elsewhere without behavioral disturbance: Secondary | ICD-10-CM | POA: Insufficient documentation

## 2015-08-17 DIAGNOSIS — I130 Hypertensive heart and chronic kidney disease with heart failure and stage 1 through stage 4 chronic kidney disease, or unspecified chronic kidney disease: Secondary | ICD-10-CM | POA: Insufficient documentation

## 2015-08-17 DIAGNOSIS — J438 Other emphysema: Secondary | ICD-10-CM | POA: Diagnosis not present

## 2015-08-17 LAB — GLUCOSE, POCT (MANUAL RESULT ENTRY): POC Glucose: 215 mg/dl — AB (ref 70–99)

## 2015-08-17 MED ORDER — ALBUTEROL SULFATE HFA 108 (90 BASE) MCG/ACT IN AERS: 2.0000 | INHALATION_SPRAY | Freq: Four times a day (QID) | RESPIRATORY_TRACT | Status: AC | PRN

## 2015-08-17 MED ORDER — CARVEDILOL 3.125 MG PO TABS: 3.1250 mg | ORAL_TABLET | Freq: Two times a day (BID) | ORAL | Status: DC

## 2015-08-17 MED ORDER — ACETAMINOPHEN-CODEINE #3 300-30 MG PO TABS: 1.0000 | ORAL_TABLET | Freq: Three times a day (TID) | ORAL | Status: DC | PRN

## 2015-08-17 NOTE — Assessment & Plan Note (Signed)
Will start back on low dose carvedilol at 3.125mg  bid with currently good BP and HR of 100/min. He agrees to weigh daily and report to us if we can obtain a scale. Will see back in 3 weeks.

## 2015-08-17 NOTE — Progress Notes (Signed)
Subjective:    Patient ID: Russell Hamilton, male    DOB: 11-13-1940, 74 y.o.   MRN: [id number]  HPI  Russell Hamilton is a 74 y.o. male with a history of Type 2 DM (Hba1c 8.2), diabetic neuropathy, dementia, HTN, CHF (EF 10-15%), tinea pedis here today for a follow up visit.  He is scheduled to see cardiology-Dr. wall due to CHF and inability to optimize his cardiac medications due to his persistent hypotension. Today he reports improvement in his pedal edema and has been compliant with Lasix. He does complain of upper right sided back pain which he describes as burning but has not noticed any rash. He continues to have neuropathy in his feet eyes requesting a refill of Tylenol No. 3 which she takes along with gabapentin.  He also complains of cough which is nonproductive, unrelated to activity but denies chest pain. He does smoke marijuana but does not use tobacco products. Remains compliant on diabetic medications but is still off his antihypertensives.   Past Medical History  Diagnosis Date  . Hypertension   . Chronic systolic CHF (congestive heart failure) (HCC)     a. 2D ECHO 08/13/14: EF 40%, diffuse hypokinesis possibly worse in the inferior wall. Mild MR, mild LA dilation, PA pressure 50. Trivial pericardial effusion.  . Acute systolic CHF (congestive heart failure), NYHA class 4 (HCC) 08/12/2014  . Type II diabetes mellitus (HCC)   . GERD (gastroesophageal reflux disease)   . Peripheral neuropathy (HCC)     [name] 08/12/2014  . Stroke [facility]) 2010    "memory problems since; right leg and left arm weakness since" (02/08/2015)  . Ulcers of both lower extremities (HCC)     [name] 02/24/2015  . Noncompliance     [name] 02/24/2015  . COPD (chronic obstructive pulmonary disease) [facility])     Past Surgical History  Procedure Laterality Date  . Carotid endarterectomy Right 2010  . Hand ligament reconstruction Right ?1970's    "pinky"  . Foot surgery Left     debridement    2016    Social  History   Social History  . Marital Status: Married    Spouse Name: N/A  . Number of Children: N/A  . Years of Education: N/A   Occupational History  . Not on file.   Social History Main Topics  . Smoking status: Current Every Day Smoker -- 0.50 packs/day for 56 years    Types: Cigarettes  . Smokeless tobacco: Never Used  . Alcohol Use: No     Comment: 02/08/2015 "I might have a beer q 6 months"  . Drug Use: Yes    Special: Marijuana     Comment: last marijuana use 08/16/15  . Sexual Activity: Not Currently   Other Topics Concern  . Not on file   Social History Narrative    No Known Allergies  Current Outpatient Prescriptions on File Prior to Visit  Medication Sig Dispense Refill  . aspirin 81 MG chewable tablet Chew 1 tablet (81 mg total) by mouth daily. 30 tablet 3  . ferrous sulfate 325 (65 FE) MG tablet Take 1 tablet (325 mg total) by mouth 2 (two) times daily with a meal. 60 tablet 1  . furosemide (LASIX) 40 MG tablet Take 1 tablet (40 mg total) by mouth 2 (two) times daily. 60 tablet 1  . gabapentin (NEURONTIN) 300 MG capsule Take 2 capsules (600 mg total) by mouth 3 (three) times daily. 180 capsule 2  . glimepiride (AMARYL) 2  MG tablet Take 1 tablet (2 mg total) by mouth daily with breakfast. 30 tablet 2  . silver sulfADIAZINE (SILVADENE) 1 % cream Apply 1 application topically daily. 50 g 0  . sitaGLIPtin (JANUVIA) 50 MG tablet Take 1 tablet (50 mg total) by mouth daily. For diabetes 30 tablet 2  . isosorbide mononitrate (IMDUR) 30 MG 24 hr tablet Take 1 tablet (30 mg total) by mouth daily. (Patient not taking: Reported on 08/17/2015) 30 tablet 2  . lisinopril (PRINIVIL,ZESTRIL) 2.5 MG tablet Take 1 tablet (2.5 mg total) by mouth daily. (Patient not taking: Reported on 08/17/2015) 30 tablet 2  . potassium chloride SA (K-DUR,KLOR-CON) 20 MEQ tablet Take 20 mEq by mouth daily.     No current facility-administered medications on file prior to visit.     Review of  Systems  Constitutional: Negative for activity change and appetite change.  HENT: Negative for sinus pressure and sore throat.   Eyes: Negative for visual disturbance.  Respiratory: Positive for shortness of breath. Negative for cough, chest tightness and wheezing.   Cardiovascular: Negative for chest pain and leg swelling.  Gastrointestinal: Negative for abdominal pain, diarrhea, constipation and abdominal distention.  Endocrine: Negative.   Genitourinary: Negative.  Negative for dysuria.  Musculoskeletal: Positive for back pain. Negative for myalgias and joint swelling.  Skin: Negative for rash.  Allergic/Immunologic: Negative.   Neurological: Positive for numbness. Negative for weakness and light-headedness.  Psychiatric/Behavioral: Negative for suicidal ideas, behavioral problems and dysphoric mood.       Objective: Filed Vitals:   08/17/15 1403  BP: 116/82  Pulse: 99  Temp: 97.5 F (36.4 C)  TempSrc: Oral  Resp: 20  Height: 5' 9.5" (1.765 m)  Weight: 156 lb (70.761 kg)  SpO2: 96%      Physical Exam  Constitutional: He is oriented to person, place, and time. He appears well-developed and well-nourished.  Cardiovascular: Normal rate, normal heart sounds and intact distal pulses.   No murmur heard. Pulmonary/Chest: Effort normal and breath sounds normal. He has no wheezes. He has no rales. He exhibits no tenderness.  Abdominal: Soft. Bowel sounds are normal. He exhibits no distension and no mass. There is no tenderness.  Musculoskeletal:  Tenderness to palpation of the right scapular region adjacent to the thoracic spine, tenderness is not worsened by range of motion of the arms.  Neurological: He is alert and oriented to person, place, and time.  Skin: Skin is warm and dry.  No evidence of rash on upper right side of back          Assessment & Plan:  1. Type 2 diabetes mellitus with diabetic chronic kidney disease Uncontrolled but his A1c has trended down to  8.2   2. Acute systolic CHF (congestive heart failure), NYHA class 4 EF 10-15% pedal edema has improved significantly Continue Lasix  bid  Refuses Basic metabolic panel today  His BP is unable to tolerate ACEI, beta blocker He will be seeing Dr. Daleen Squibb later today.  3. Peripheral neuropathy He remains on gabapentin. Tylenol 3 refill  4. Essential hypertension Controlled All his antihypertensives have been on hold due to his tendency to be hypotensive.  5. Dementia with Lewy bodies He continues to refuse home care and nursing services We're working towards obtaining the pharmacy that would deliver his medication and pillboxes to enable compliance  6. Emphysema His chart reveals evidence of emphysema and he is not on any bronchodilator and so I will prescribe a trial of Proventil as  needed and will reevaluate for the need for inhaled steroids.

## 2015-08-17 NOTE — Progress Notes (Signed)
HPI [name] is a 74 year old African-American male is referred by Dr.Amau for chronic systolic heart failure. Looking back to the records it appears this is ischemic though he is refused workup. His last ejection fraction was 15% with LV dilatation RV dilatation.  He has had problems with hypotension. He denies syncope but his memory is not great. He has significant memory problems with a history of stroke.  He is currently only taking Lasix 40 mg twice a day. He does not weigh himself daily. He says he is not agitated and having anybody come to his home because he doesn't trust them. He is willing to get a scale from us.  He has lower extremity edema which is chronic. He denies orthopnea PND.  Past Medical History  Diagnosis Date  . Hypertension   . Chronic systolic CHF (congestive heart failure) (HCC)     a. 2D ECHO 08/13/14: EF 40%, diffuse hypokinesis possibly worse in the inferior Sadae Arrazola. Mild MR, mild LA dilation, PA pressure 50. Trivial pericardial effusion.  . Acute systolic CHF (congestive heart failure), NYHA class 4 (HCC) 08/12/2014  . Type II diabetes mellitus (HCC)   . GERD (gastroesophageal reflux disease)   . Peripheral neuropathy (HCC)     [name]/notes 08/12/2014  . Stroke [facility](H[facility]) 2010    "memory problems since; right leg and left arm weakness since" (02/08/2015)  . Ulcers of both lower extremities (HCC)     [name]/notes 02/24/2015  . Noncompliance     [name]/notes 02/24/2015  . COPD (chronic obstructive pulmonary disease) (HCC)     Current Outpatient Prescriptions  Medication Sig Dispense Refill  . acetaminophen-codeine (TYLENOL #3) 300-30 MG tablet Take 1 tablet by mouth every 8 (eight) hours as needed for moderate pain. 40 tablet 1  . albuterol (PROVENTIL HFA;VENTOLIN HFA) 108 (90 BASE) MCG/ACT inhaler Inhale 2 puffs into the lungs every 6 (six) hours as needed for wheezing or shortness of breath. 1 Inhaler 1  . aspirin 81 MG chewable tablet Chew 1 tablet (81 mg total) by mouth daily. 30 tablet  3  . ferrous sulfate 325 (65 FE) MG tablet Take 1 tablet (325 mg total) by mouth 2 (two) times daily with a meal. 60 tablet 1  . furosemide (LASIX) 40 MG tablet Take 1 tablet (40 mg total) by mouth 2 (two) times daily. 60 tablet 1  . gabapentin (NEURONTIN) 300 MG capsule Take 2 capsules (600 mg total) by mouth 3 (three) times daily. 180 capsule 2  . potassium chloride SA (K-DUR,KLOR-CON) 20 MEQ tablet Take 20 mEq by mouth daily.    . silver sulfADIAZINE (SILVADENE) 1 % cream Apply 1 application topically daily. 50 g 0  . sitaGLIPtin (JANUVIA) 50 MG tablet Take 1 tablet (50 mg total) by mouth daily. For diabetes 30 tablet 2  . carvedilol (COREG) 3.125 MG tablet Take 1 tablet (3.125 mg total) by mouth 2 (two) times daily with a meal. (Patient not taking: Reported on 08/17/2015) 60 tablet 2  . glimepiride (AMARYL) 2 MG tablet Take 1 tablet (2 mg total) by mouth daily with breakfast. 30 tablet 2  . isosorbide mononitrate (IMDUR) 30 MG 24 hr tablet Take 1 tablet (30 mg total) by mouth daily. (Patient not taking: Reported on 08/17/2015) 30 tablet 2  . lisinopril (PRINIVIL,ZESTRIL) 2.5 MG tablet Take 1 tablet (2.5 mg total) by mouth daily. (Patient not taking: Reported on 08/17/2015) 30 tablet 2   No current facility-administered medications for this visit.    No Known Allergies  Family History  Problem Relation Age of Onset  . Kidney disease Mother   . Heart attack Brother     X 2  . Cancer Brother     Social History   Social History  . Marital Status: Married    Spouse Name: N/A  . Number of Children: N/A  . Years of Education: N/A   Occupational History  . Not on file.   Social History Main Topics  . Smoking status: Current Every Day Smoker -- 0.50 packs/day for 56 years    Types: Cigarettes  . Smokeless tobacco: Never Used  . Alcohol Use: No     Comment: 02/08/2015 "I might have a beer q 6 months"  . Drug Use: Yes    Special: Marijuana     Comment: last marijuana use 08/16/15   . Sexual Activity: Not Currently   Other Topics Concern  . Not on file   Social History Narrative    ROS ALL NEGATIVE EXCEPT THOSE NOTED IN HPI  PE  General Appearance: well developed, well nourished in no acute distress HEENT: symmetrical face, PERRLA, good dentition  Neck: no JVD, thyromegaly, or adenopathy, trachea midline Chest: symmetric without deformity Cardiac: PMI displaced, RRR, normal S1, S2, no gallop or murmur Lung: clear to ausculation and percussion Vascular: Pulses barely palpable in the lower extremities Abdominal: nondistended, nontender, good bowel sounds, no HSM, no bruits Extremities: no cyanosis, clubbing or edema, no sign of DVT, no varicosities  Skin: normal color, no rashes Neuro: alert and oriented x 3, non-focal Pysch: normal affect  EKG  BMET    Component Value Date/Time   NA 133* 07/12/2015 2220   K 4.0 07/12/2015 2220   CL 99* 07/12/2015 2220   CO2 27 07/12/2015 2220   GLUCOSE 158* 07/12/2015 2220   BUN 5* 07/12/2015 2220   CREATININE 1.09 07/12/2015 2220   CREATININE 0.99 06/20/2015 1452   CALCIUM 8.4* 07/12/2015 2220   GFRNONAA >60 07/12/2015 2220   GFRAA >60 07/12/2015 2220    Lipid Panel     Component Value Date/Time   CHOL 167 11/20/2014 0420   TRIG 85 11/20/2014 0420   HDL 55 11/20/2014 0420   CHOLHDL 3.0 11/20/2014 0420   VLDL 17 11/20/2014 0420   LDLCALC 95 11/20/2014 0420    CBC    Component Value Date/Time   WBC 3.2* 07/12/2015 2220   RBC 3.48* 07/12/2015 2220   HGB 10.7* 07/12/2015 2220   HCT 33.1* 07/12/2015 2220   PLT 139* 07/12/2015 2220   MCV 95.1 07/12/2015 2220   MCH 30.7 07/12/2015 2220   MCHC 32.3 07/12/2015 2220   RDW 16.8* 07/12/2015 2220   LYMPHSABS 1.2 06/12/2015 1214   MONOABS 1.0 06/12/2015 1214   EOSABS 0.0 06/12/2015 1214   BASOSABS 0.0 06/12/2015 1214

## 2015-08-17 NOTE — Progress Notes (Signed)
Pt's here for F/up pedal edema. Pt reports pain in his feet and his back pain rated at 7.  Pt states when he leans backwards, the pain in his back subsides, but when he sits up the pain resumes.   Pt has taken gabapetin, and the lasix meds only.   Pt also states he's had SOB x4days ago. Pt was hospitalized several times in the past for SOB. Requesting refill of Tylenol #3.

## 2015-08-17 NOTE — Patient Instructions (Signed)
Thank you for coming in to see Dr. Daleen SquibbWall today.  Follow-up with Dr. Daleen SquibbWall in 2-3 weeks.  Start taking carvedilol twice daily.

## 2015-08-17 NOTE — Progress Notes (Signed)
[facility] Pt reports upper back pain, rated at a 7 described as burning. Pain is constant and has been present for 4 days now. Pt denies chest pain and wheezing. Pt reports a little bit of SOB along with a cough that he currently has. Pt has some swelling in both feet but reports it has decreased a lot. Pt states both of his feet are really numb. Pt has only taken his lasix and gabapentin today. Pt smokes 1/2pk cigarette daily.

## 2015-08-17 NOTE — Addendum Note (Signed)
Addended by: Benjamin StainBENNETT-CURSE, Jelene Albano L on: 08/17/2015 04:02 PM   Modules accepted: Orders

## 2015-08-18 ENCOUNTER — Encounter: Payer: Self-pay | Admitting: Cardiology

## 2015-08-29 ENCOUNTER — Telehealth: Payer: Self-pay

## 2015-08-29 NOTE — Telephone Encounter (Signed)
Patient returned call. Please f/u with pt. °

## 2015-08-29 NOTE — Telephone Encounter (Signed)
Attempted to contact the patient to check on his status.  Call placed to # [phone] (H) and a voice mail message was left requesting a call back to # [phone] or [phone].  

## 2015-08-29 NOTE — Telephone Encounter (Signed)
Call received from the patient.  He said that he is " not feeling good."  He note that his feet are still swollen and the swelling is not going down even after he has been sleeping all night. He also said that he has been coughing more and he coughed frequently during this phone call and he said that sometimes he is short of breath when he coughs.  He denied having a fever. He also noted that he can't find all of his medications and he does not know where he put them . Instructed him about the need to seek medical attention at the hospital in the ED due to his shortness of breath and edema. He adamantly said that he did not want to go to the ED now but he would if he got worse.  Instructed him about the importance of going to the ED to address his coughing, edema and shortness of breath before it gets any worse. He again denied the need to go to the ED at this time but again stated that he would go if he felt any worse. [facility]. [name] had no appointments available. This CM spoke to Charlett LangoJamilla Pinder, [facility] Practice Manager who stated that the patient could come to the clinic at 0900 tomorrow, 08/30/15, and could wait to see [name].  Lanice ShirtsJ. Pinder  also noted that he could take a cab  - [employer] and [facility] would [facility] for the cab ride when he arrives at the clinic.    This CM then  Informed the patient that [facility]00 and he said he would try to make it but mornings can be difficult for him to get started. He did note that he thinks he might have enough money for a cab ride but understands that [facility] for his cab ride if needed.  This CM stressed the importance of going  to the ED immediately if he is having any difficulty breathing and not to wait until the morning and he stated that he would go to the ED if he felt any worse.

## 2015-08-30 ENCOUNTER — Ambulatory Visit: Payer: Medicare Other | Admitting: Family Medicine

## 2015-08-30 ENCOUNTER — Telehealth: Payer: Self-pay

## 2015-08-30 NOTE — Telephone Encounter (Signed)
Attempted to contact the patient to check on his status.  Call placed to # [phone] (H) and a voice mail message was left requesting a call back to # [phone] or [phone].

## 2015-08-31 ENCOUNTER — Telehealth: Payer: Self-pay

## 2015-08-31 NOTE — Telephone Encounter (Signed)
Call placed to patient to check on his status and to determine if patient needs to be seen by Dr. Venetia NightAmao today. Unable to reach patient; HIPPA compliant voicemail left requesting return call.

## 2015-09-01 ENCOUNTER — Telehealth: Payer: Self-pay

## 2015-09-01 NOTE — Telephone Encounter (Signed)
This Case Manager received return phone call from patient. Inquired about patient's status. He indicated he was "fine" and that he was "not short of breath" any longer and was "rarely coughing". He indicated he started feeling better on Tuesday 08/30/15. Inquired about pedal edema. Patient indicated his "feet were still swollen but not bad." Patient has an appointment scheduled on 09/07/15 at 1400 with Dr. Daleen SquibbWall and 09/07/15 at 1500 with Dr. Venetia NightAmao; however, this Case Manager informed patient that Dr. Venetia NightAmao could see him tomorrow-09/02/15.  Patient declined and indicated he did not need to be seen tomorrow. He indicated he would wait until his appointments on 09/07/15.  Per note from Robyne PeersJane Brazeau on 08/29/15, patient told Erskine SquibbJane that he could not find some of his medications. When asked about this patient indicated he found his medications and is taking them as prescribed.  This Case Manager asked patient if he would like a home health nurse set-up a pill box for him and assist with medication management. Patient declined needing any assistance with his medications or needing any home health services.  Inquired if patient would have transportation to upcoming appointments, and he indicated he would need transportation to his appointments on 09/07/15.  Patient informed that transportation would be provided. Cecilia Deluna notified, and she indicated she would arrange transportation.

## 2015-09-01 NOTE — Telephone Encounter (Signed)
This Case Manager placed call to patient to check on status; unable to reach patient. Voicemail left requesting return call. Also placed call to patient's emergency contact, Alcide Cleveronya Amos [phone]), as this Case Manager has been unable to reach patient.  Archie Pattenonya indicated she last spoke with patient yesterday and HIPPA compliant message left with Archie Pattenonya requesting patient return call to this Case Manager at [phone].

## 2015-09-02 ENCOUNTER — Telehealth: Payer: Self-pay

## 2015-09-02 NOTE — Telephone Encounter (Signed)
This Case Manager received call from patient who indicated he needed a refill of his gabapentin.  Reviewed medication list and noted patient should have 2 refills remaining on gabapentin script.  Informed [facility] to [facility] and request refill of medication.  Patient verbalized understanding. No additional needs/concerns identified.

## 2015-09-06 ENCOUNTER — Telehealth: Payer: Self-pay

## 2015-09-06 NOTE — Telephone Encounter (Signed)
Call placed to the patient to confirm his appointments for tomorrow - 09/07/15 @ 1400 with Dr Daleen SquibbWall and then @1500  with [name].  He said that he would need transportation.  He that his breathing is " fine" and the swelling of his feet is " the same "but slowly going down."  He noted that he slept on his right forearm and it is  now " sore."  No numbness, chest pain, shortness of breath reported. He noted again that it is " just sore" due to how he slept on it and he does not need to seek any medical attention.  No other problems/concerns reported.    Esmond Plantsecilia deLuna, [facility] scheduler notified of the need for transportation for the patient tomorrow. She said that Dr Daleen SquibbWall has cancelled his appointments for tomorrow and she called the patient notify him of the cancellation and left the patient a message to return the call.  She then noted that she would try to contact him again and would schedule the transportation for him.

## 2015-09-07 ENCOUNTER — Encounter: Payer: Self-pay | Admitting: Family Medicine

## 2015-09-07 ENCOUNTER — Telehealth: Payer: Self-pay

## 2015-09-07 ENCOUNTER — Ambulatory Visit: Payer: Medicare Other | Admitting: Cardiology

## 2015-09-07 ENCOUNTER — Ambulatory Visit: Payer: Medicare Other | Attending: Family Medicine | Admitting: Family Medicine

## 2015-09-07 VITALS — BP 119/77 | HR 115 | Temp 98.1°F | Resp 18

## 2015-09-07 DIAGNOSIS — G3183 Dementia with Lewy bodies: Secondary | ICD-10-CM | POA: Diagnosis not present

## 2015-09-07 DIAGNOSIS — I130 Hypertensive heart and chronic kidney disease with heart failure and stage 1 through stage 4 chronic kidney disease, or unspecified chronic kidney disease: Secondary | ICD-10-CM | POA: Insufficient documentation

## 2015-09-07 DIAGNOSIS — F0280 Dementia in other diseases classified elsewhere without behavioral disturbance: Secondary | ICD-10-CM | POA: Insufficient documentation

## 2015-09-07 DIAGNOSIS — I10 Essential (primary) hypertension: Secondary | ICD-10-CM

## 2015-09-07 DIAGNOSIS — I5023 Acute on chronic systolic (congestive) heart failure: Secondary | ICD-10-CM | POA: Insufficient documentation

## 2015-09-07 DIAGNOSIS — G6289 Other specified polyneuropathies: Secondary | ICD-10-CM | POA: Diagnosis not present

## 2015-09-07 DIAGNOSIS — E1122 Type 2 diabetes mellitus with diabetic chronic kidney disease: Secondary | ICD-10-CM

## 2015-09-07 DIAGNOSIS — I639 Cerebral infarction, unspecified: Secondary | ICD-10-CM

## 2015-09-07 DIAGNOSIS — J441 Chronic obstructive pulmonary disease with (acute) exacerbation: Secondary | ICD-10-CM | POA: Diagnosis not present

## 2015-09-07 DIAGNOSIS — G894 Chronic pain syndrome: Secondary | ICD-10-CM | POA: Diagnosis not present

## 2015-09-07 DIAGNOSIS — N189 Chronic kidney disease, unspecified: Secondary | ICD-10-CM

## 2015-09-07 DIAGNOSIS — E1165 Type 2 diabetes mellitus with hyperglycemia: Secondary | ICD-10-CM | POA: Diagnosis not present

## 2015-09-07 MED ORDER — OXYCODONE-ACETAMINOPHEN 5-325 MG PO TABS: 1.0000 | ORAL_TABLET | Freq: Two times a day (BID) | ORAL | Status: DC | PRN

## 2015-09-07 MED ORDER — FUROSEMIDE 40 MG PO TABS: 60.0000 mg | ORAL_TABLET | Freq: Two times a day (BID) | ORAL | Status: DC

## 2015-09-07 MED ORDER — IPRATROPIUM BROMIDE 0.02 % IN SOLN: 0.5000 mg | Freq: Once | RESPIRATORY_TRACT | Status: DC

## 2015-09-07 MED ORDER — PREGABALIN 100 MG PO CAPS: 100.0000 mg | ORAL_CAPSULE | Freq: Two times a day (BID) | ORAL | Status: DC

## 2015-09-07 MED ORDER — FLUTICASONE FUROATE-VILANTEROL 100-25 MCG/INH IN AEPB: 1.0000 | INHALATION_SPRAY | Freq: Every day | RESPIRATORY_TRACT | Status: AC

## 2015-09-07 NOTE — Progress Notes (Signed)
Pt here for f/up HTN. Pt states that he's hurting all over and swelling in feet has gotten worse since last visit. Rates pain at 10/10.  Pt reports that he can't stop coughing. Pt says gabapentin and Tylenol is not working.   Pt states that he's feeling cold.  Having issues with lifting his right arm. Pt states it is very painful.

## 2015-09-07 NOTE — Telephone Encounter (Signed)
Call received from the patient stating that he is " in agony" and has " shooting pains" in his hands, arms, legs and feet. He denied any chest pain or pain elsewhere in his body. He said that the pain was a 10/10 and he has been taking the gabapentin and tylenol as ordered and has not had any relief of the pain since yesterday evening.    He was aware that he had an appointment with [name] this afternoon and he did not want to go to the ED.  This CM updated [name] about his complaints of shooting pain and she noted that he should come to his appointment this afternoon.  This CM stressed with him the importance of coming to his appointment today at 1500 as his appointment at 1400 with Dr Daleen SquibbWall was cancelled for today.  He said that he was not aware of the cancellation and said that he would be at his appointment this afternoon with [name].  Russell Hamilton, [facility] scheduler noted that transportation has been scheduled to pick him up at 1430 and he said that he would be ready.

## 2015-09-07 NOTE — Telephone Encounter (Signed)
Met with the patient while he was at the [facility] for his appointment with Dr Jarold Song.    Discussed with him need for compliance with his medications at home.  He still refuses home care services. He is agreeable to using nebulizer treatments if ordered.  He is aware that he can pick up the Breo ( samples are available as per Velna Hatchet, Pharmacist) at the [facility] pharmacy tomorrow.  He was also made aware that he needs to have the percocet filled at another pharmacy , not [facility], and he stated that [facility] or [place] and his niece could drive him to the pharmacy later this evening.  We spoke about the cost of lyrica and that it can't be filled at [place].  He was notified that the cost for the lyrica could possibly be $400-650/month.  He stated that the cost was prohibitive but he was going to check with his pharmacy when he had the prescription for percocet filled.   This CM discussed the options for assistance with the cost of the lyrica with Nicoletta Ba, Pharmacist.  She provided information and an application for the [employer] Patient Assistance Program.  This CM explained the purpose of the Patient Assistance Program to the patient and reviewed the application with him and informed him that the staff at [facility] can assist him with completing the application; but he needs to have copy of a document that shows his total annual income. . The types of income documents  that are acceptable were reviewed with the patient and he said that he did not have any of them.   He was then ready to leave the clinic at that time and did not want to discuss how he could obtain the documents needed for the application.  CM to call him later in the week to check on his status and to discuss how he may obtain a copy of his social security statement of  his annual income.

## 2015-09-07 NOTE — Patient Instructions (Signed)
Chronic Obstructive Pulmonary Disease Chronic obstructive pulmonary disease (COPD) is a common lung condition in which airflow from the lungs is limited. COPD is a general term that can be used to describe many different lung problems that limit airflow, including both chronic bronchitis and emphysema. If you have COPD, your lung function will probably never return to normal, but there are measures you can take to improve lung function and make yourself feel better. CAUSES   Smoking (common).  Exposure to secondhand smoke.  Genetic problems.  Chronic inflammatory lung diseases or recurrent infections. SYMPTOMS  Shortness of breath, especially with physical activity.  Deep, persistent (chronic) cough with a large amount of thick mucus.  Wheezing.  Rapid breaths (tachypnea).  Gray or bluish discoloration (cyanosis) of the skin, especially in your fingers, toes, or lips.  Fatigue.  Weight loss.  Frequent infections or episodes when breathing symptoms become much worse (exacerbations).  Chest tightness. DIAGNOSIS Your health care provider will take a medical history and perform a physical examination to diagnose COPD. Additional tests for COPD may include:  Lung (pulmonary) function tests.  Chest X-ray.  CT scan.  Blood tests. TREATMENT  Treatment for COPD may include:  Inhaler and nebulizer medicines. These help manage the symptoms of COPD and make your breathing more comfortable.  Supplemental oxygen. Supplemental oxygen is only helpful if you have a low oxygen level in your blood.  Exercise and physical activity. These are beneficial for nearly all people with COPD.  Lung surgery or transplant.  Nutrition therapy to gain weight, if you are underweight.  Pulmonary rehabilitation. This may involve working with a team of health care providers and specialists, such as respiratory, occupational, and physical therapists. HOME CARE INSTRUCTIONS  Take all medicines  (inhaled or pills) as directed by your health care provider.  Avoid over-the-counter medicines or cough syrups that dry up your airway (such as antihistamines) and slow down the elimination of secretions unless instructed otherwise by your health care provider.  If you are a smoker, the most important thing that you can do is stop smoking. Continuing to smoke will cause further lung damage and breathing trouble. Ask your health care provider for help with quitting smoking. He or she can direct you to community resources or hospitals that provide support.  Avoid exposure to irritants such as smoke, chemicals, and fumes that aggravate your breathing.  Use oxygen therapy and pulmonary rehabilitation if directed by your health care provider. If you require home oxygen therapy, ask your health care provider whether you should purchase a pulse oximeter to measure your oxygen level at home.  Avoid contact with individuals who have a contagious illness.  Avoid extreme temperature and humidity changes.  Eat healthy foods. Eating smaller, more frequent meals and resting before meals may help you maintain your strength.  Stay active, but balance activity with periods of rest. Exercise and physical activity will help you maintain your ability to do things you want to do.  Preventing infection and hospitalization is very important when you have COPD. Make sure to receive all the vaccines your health care provider recommends, especially the pneumococcal and influenza vaccines. Ask your health care provider whether you need a pneumonia vaccine.  Learn and use relaxation techniques to manage stress.  Learn and use controlled breathing techniques as directed by your health care provider. Controlled breathing techniques include:  Pursed lip breathing. Start by breathing in (inhaling) through your nose for 1 second. Then, purse your lips as if you were   going to whistle and breathe out (exhale) through the  pursed lips for 2 seconds.  Diaphragmatic breathing. Start by putting one hand on your abdomen just above your waist. Inhale slowly through your nose. The hand on your abdomen should move out. Then purse your lips and exhale slowly. You should be able to feel the hand on your abdomen moving in as you exhale.  Learn and use controlled coughing to clear mucus from your lungs. Controlled coughing is a series of short, progressive coughs. The steps of controlled coughing are: 1. Lean your head slightly forward. 2. Breathe in deeply using diaphragmatic breathing. 3. Try to hold your breath for 3 seconds. 4. Keep your mouth slightly open while coughing twice. 5. Spit any mucus out into a tissue. 6. Rest and repeat the steps once or twice as needed. SEEK MEDICAL CARE IF:  You are coughing up more mucus than usual.  There is a change in the color or thickness of your mucus.  Your breathing is more labored than usual.  Your breathing is faster than usual. SEEK IMMEDIATE MEDICAL CARE IF:  You have shortness of breath while you are resting.  You have shortness of breath that prevents you from:  Being able to talk.  Performing your usual physical activities.  You have chest pain lasting longer than 5 minutes.  Your skin color is more cyanotic than usual.  You measure low oxygen saturations for longer than 5 minutes with a pulse oximeter. MAKE SURE YOU:  Understand these instructions.  Will watch your condition.  Will get help right away if you are not doing well or get worse.   This information is not intended to replace advice given to you by your health care provider. Make sure you discuss any questions you have with your health care provider.   Document Released: 08/01/2005 Document Revised: 11/12/2014 Document Reviewed: 06/18/2013 Elsevier Interactive Patient Education 2016 Elsevier Inc.  

## 2015-09-07 NOTE — Progress Notes (Signed)
Subjective:    Patient ID: Russell Hamilton, male    DOB: 07/25/1941, [age] y.o.   MRN: [id number]  HPI 74 year old male with a history of uncontrolled type 2 diabetes mellitus (A1c 8.2), systolic CHF (EF 86-57%10-15% from 07/2015; who has persistently refused cardiac intervention), chronic pain from diabetic neuropathy, COPD, hypertension who comes into the clinic for follow-up.  He is groaning in pain and states he wants to go to the hospital because he is in pain and his Neurontin and Tylenol with Codeine are not working. He has pain in his hands and legs which are deep and even the slight touch of his clothes on his skin hurts so bad.   He has been coughing a lot and has a proventil MDI which he has not used due to poor technique; also complaining of pedal edema and not feeling good.  He was seen by cardiology-Dr. wall and carvedilol was added to his regimen as all his previous cardiac medications had been on hold because he tended to have the blood pressure in the hypertensive range, however he is not here with his carvedilol and his heart rate is 115  He is unsure if he has been taking the carvedilol and of note he has had a problem with misplacing his medications in the past due to his dementia and has persistently refused home nursing services to assist with this because "he does not want anyone to come to his house."  Past Medical History  Diagnosis Date  . Hypertension   . Chronic systolic CHF (congestive heart failure) (HCC)     a. 2D ECHO 08/13/14: EF 40%, diffuse hypokinesis possibly worse in the inferior wall. Mild MR, mild LA dilation, PA pressure 50. Trivial pericardial effusion.  . Acute systolic CHF (congestive heart failure), NYHA class 4 (HCC) 08/12/2014  . Type II diabetes mellitus (HCC)   . GERD (gastroesophageal reflux disease)   . Peripheral neuropathy (HCC)     [name]/notes 08/12/2014  . Stroke [facility](HCC) 2010    "memory problems since; right leg and left arm weakness since" (02/08/2015)  . Ulcers  of both lower extremities (HCC)     [name]/notes 02/24/2015  . Noncompliance     [name]/notes 02/24/2015  . COPD (chronic obstructive pulmonary disease) [facility](HCC)     Past Surgical History  Procedure Laterality Date  . Carotid endarterectomy Right 2010  . Hand ligament reconstruction Right ?1970's    "pinky"  . Foot surgery Left     debridement    2016    Social History   Social History  . Marital Status: Married    Spouse Name: N/A  . Number of Children: N/A  . Years of Education: N/A   Occupational History  . Not on file.   Social History Main Topics  . Smoking status: Current Every Day Smoker -- 0.50 packs/day for 56 years    Types: Cigarettes  . Smokeless tobacco: Never Used  . Alcohol Use: No     Comment: 02/08/2015 "I might have a beer q 6 months"  . Drug Use: Yes    Special: Marijuana     Comment: last marijuana use 08/16/15  . Sexual Activity: Not Currently   Other Topics Concern  . Not on file   Social History Narrative    No Known Allergies  Current Outpatient Prescriptions on File Prior to Visit  Medication Sig Dispense Refill  . acetaminophen-codeine (TYLENOL #3) 300-30 MG tablet Take 1 tablet by mouth every 8 (eight) hours as  needed for moderate pain. 40 tablet 1  . albuterol (PROVENTIL HFA;VENTOLIN HFA) 108 (90 BASE) MCG/ACT inhaler Inhale 2 puffs into the lungs every 6 (six) hours as needed for wheezing or shortness of breath. 1 Inhaler 1  . aspirin 81 MG chewable tablet Chew 1 tablet (81 mg total) by mouth daily. 30 tablet 3  . carvedilol (COREG) 3.125 MG tablet Take 1 tablet (3.125 mg total) by mouth 2 (two) times daily with a meal. 60 tablet 2  . ferrous sulfate 325 (65 FE) MG tablet Take 1 tablet (325 mg total) by mouth 2 (two) times daily with a meal. 60 tablet 1  . glimepiride (AMARYL) 2 MG tablet Take 1 tablet (2 mg total) by mouth daily with breakfast. 30 tablet 2  . potassium chloride SA (K-DUR,KLOR-CON) 20 MEQ tablet Take 20 mEq by mouth daily.    . silver  sulfADIAZINE (SILVADENE) 1 % cream Apply 1 application topically daily. 50 g 0  . sitaGLIPtin (JANUVIA) 50 MG tablet Take 1 tablet (50 mg total) by mouth daily. For diabetes 30 tablet 2  . isosorbide mononitrate (IMDUR) 30 MG 24 hr tablet Take 1 tablet (30 mg total) by mouth daily. (Patient not taking: Reported on 08/17/2015) 30 tablet 2  . lisinopril (PRINIVIL,ZESTRIL) 2.5 MG tablet Take 1 tablet (2.5 mg total) by mouth daily. (Patient not taking: Reported on 08/17/2015) 30 tablet 2   No current facility-administered medications on file prior to visit.     Review of Systems  Constitutional: Negative for activity change and appetite change.  HENT: Negative for sinus pressure and sore throat.   Eyes: Negative for visual disturbance.  Respiratory: Positive for shortness of breath. positive for cough, negative for chest tightness and wheezing.   Cardiovascular: Negative for chest pain and positive for leg swelling.  Gastrointestinal: Negative for abdominal pain, diarrhea, constipation and abdominal distention.  Endocrine: Negative.   Genitourinary: Negative.  Negative for dysuria.  Musculoskeletal: Positive for leg pains Skin: Negative for rash.  Allergic/Immunologic: Negative.   Neurological: Positive for numbness. Negative for weakness and light-headedness.  Psychiatric/Behavioral: Negative for suicidal ideas, behavioral problems and dysphoric mood.       Objective: Filed Vitals:   09/07/15 1511  BP: 119/77  Pulse: 115  Temp: 98.1 F (36.7 C)  TempSrc: Oral  Resp: 18  SpO2: 90%      Physical Exam  Constitutional: He is oriented to person, place, and time. He appears distressed.  Patient is groaning in pain   Neck: Normal range of motion. Neck supple. No tracheal deviation present.  Cardiovascular: Regular rhythm and normal heart sounds.  Tachycardia present.   No murmur heard. Pulmonary/Chest: Effort normal and breath sounds normal. No respiratory distress. He has no  wheezes. He exhibits no tenderness.  Abdominal: Soft. Bowel sounds are normal. He exhibits no mass. There is no tenderness.  Musculoskeletal: Normal range of motion. He exhibits edema (1+ bilateral pedal edema) and tenderness (Tenderness on palpation of both lower extremity).  Neurological: He is alert and oriented to person, place, and time.  Skin: Skin is warm and dry.  Psychiatric: He has a normal mood and affect.   CMP Latest Ref Rng 07/12/2015 06/20/2015 06/14/2015  Glucose 65 - 99 mg/dL 960(A) 540(J) 811(B)  BUN 6 - 20 mg/dL 5(L) 9 16  Creatinine 1.47 - 1.24 mg/dL 8.29 5.62 1.30(Q)  Sodium 135 - 145 mmol/L 133(L) 135 136  Potassium 3.5 - 5.1 mmol/L 4.0 3.9 3.3(L)  Chloride 101 - 111 mmol/L  99(L) 98 97(L)  CO2 22 - 32 mmol/L 27 30 32  Calcium 8.9 - 10.3 mg/dL 1.6(X) 8.8 0.9(U)  Total Protein 6.0 - 8.3 g/dL - - -  Total Bilirubin 0.3 - 1.2 mg/dL - - -  Alkaline Phos 39 - 117 U/L - - -  AST 0 - 37 U/L - - -  ALT 0 - 53 U/L - - -           Assessment & Plan:  1. Type 2 diabetes mellitus with diabetic chronic kidney disease Uncontrolled but his A1c has trended down to 8.2   2. Acute systolic CHF (congestive heart failure), NYHA class 4 EF 10-15% Pedal edema still present Increase Lasix to 60 mg twice daily. He has been refusing labs; he will need a BMET at his next visit He was started on Coreg 3.125 twice daily which does not appear among the medications he has with him which explains his tachycardia  3. Peripheral neuropathy Uncontrolled on gabapentin and so I will prescribe Lyrica (he does not have prescription drug coverage and this would have to be applied for through the prescription assistance program which may take up to a month post ) I am referring him to pain management and will give him a limited supply of percocet  4. Essential hypertension Controlled .  5. Dementia with Lewy bodies He continues to refuse home care and nursing services We're working towards  obtaining the pharmacy that would deliver his medication and pillboxes to enable compliance  6. Emphysema Atrovent treatment given in the clinic as he is tachycardic. I am suspecting his cough is morel from his uncontrolled COPD and have demonstrated proper technique for administration of Proventil MDI. I will place him on [facility] ED precautions discussed.  Greater than half of her duration of encounter spent coordinating his care with the pharmacy and the case manager.

## 2015-09-08 ENCOUNTER — Telehealth: Payer: Self-pay

## 2015-09-08 DIAGNOSIS — G894 Chronic pain syndrome: Secondary | ICD-10-CM | POA: Insufficient documentation

## 2015-09-08 NOTE — Telephone Encounter (Signed)
This Case Manager placed call to patient to check on status, to remind him to [facility], to reiterate documents needed for [employer] Patient Assistance Program for Lyrica (if patient unable to afford medication) and who to call if he does not have needed documents.  Unable to reach patient; voicemail left requesting return call.

## 2015-09-08 NOTE — Telephone Encounter (Signed)
This Case Manager received return call from patient. Patient informed he had Breo samples available for pick-up [facility]. In addition, spoke with patient about [employer] Patient Assistance Program for Lyrica. Patient indicated he was informed about the program yesterday, and he indicated he does not have the documentation needed for the application. Informed patient he could call [place] Social Security Administration [phone]) to get a Social Security statement mailed to him. Patient refused and indicated he's always been told he makes "too much money" to qualify for patient assistance programs. Explained that this Patient Assistance program was through [employer] not Medicare.  Patient again adamantly refused to get needed documentation for paperwork and indicated he was "not going to waste [his] time." Patient indicated he [facility] and had to pay $489.00 out of pocket. Again, tried to discuss importance of determining if he qualifies for assistance with Lyrica to cut down on cost, but patient continued to refuse.  Patient reminded of upcoming appointment on 09/14/15 at 1430. Patient aware of appointment. No additional needs/concerns identified.

## 2015-09-09 ENCOUNTER — Telehealth: Payer: Self-pay

## 2015-09-09 NOTE — Telephone Encounter (Signed)
This Case Manager placed an additional call to check on patient's status. Unable to reach; left an additional voicemail requesting return call.

## 2015-09-09 NOTE — Telephone Encounter (Signed)
This Case Manager received return call from patient. Patient denied any shortness of breath and indicated he was breathing "fine."  He indicated he slept 8 hours last night. He indicated he continues to have pain in his feet and ankles. Pain currently 8/10. He indicated he was being compliant with his Percocet and Lyrica. Dr. Venetia NightAmao notified of patient's pain, and she indicated patient needs to be seen by Pain Management. She indicated referral to Pain Management placed on 09/08/15.    Patient also indicated he was experiencing acid reflux. Note routed to Dr. Venetia NightAmao to determine if a medication could be prescribed for acid reflux. Awaiting response  In addition, this Case Manager inquired if patient kept his appointment in October with the [facility] as if patient linked to the [place] he may be eligible for [place] through the [place]. Patient indicated he missed his appointment. This Case Manager encouraged patient to make another appointment with [facility].  Provided patient with the number for the [facility] [place]. Patient appreciative of information.

## 2015-09-09 NOTE — Telephone Encounter (Signed)
This Case Manager placed call to patient to check on status. Unable to reach; left voicemail requesting return call.

## 2015-09-12 ENCOUNTER — Other Ambulatory Visit: Payer: Self-pay | Admitting: Family Medicine

## 2015-09-12 MED ORDER — OMEPRAZOLE 20 MG PO CPDR: 20.0000 mg | DELAYED_RELEASE_CAPSULE | Freq: Every day | ORAL | Status: AC

## 2015-09-12 NOTE — Telephone Encounter (Signed)
Omeprazole sent to pharmacy.

## 2015-09-13 ENCOUNTER — Telehealth: Payer: Self-pay

## 2015-09-13 NOTE — Telephone Encounter (Signed)
Call placed to the patient to check on his status and to remind [facility] of [facility], 09/14/15 @ 1430. Voice mail message left requesting a call back to # [phone] or [phone].   This CM spoke to Esmond Plantsecilia deLuna, [facility] scheduler who stated that she spoke to the patient and he said that he will be at his appointment tomorrow. She has arranged for a cab to pick him up at 1400.

## 2015-09-14 ENCOUNTER — Inpatient Hospital Stay (HOSPITAL_COMMUNITY)
Admission: EM | Admit: 2015-09-14 | Discharge: 2015-09-16 | DRG: 291 | Discharge status: Against Medical Advice | Payer: Medicare Other | Attending: Family Medicine | Admitting: Family Medicine

## 2015-09-14 ENCOUNTER — Encounter (HOSPITAL_COMMUNITY): Payer: Self-pay | Admitting: Emergency Medicine

## 2015-09-14 ENCOUNTER — Ambulatory Visit: Payer: Medicare Other | Admitting: Family Medicine

## 2015-09-14 ENCOUNTER — Emergency Department (HOSPITAL_COMMUNITY): Payer: Medicare Other

## 2015-09-14 ENCOUNTER — Telehealth: Payer: Self-pay

## 2015-09-14 DIAGNOSIS — Z7982 Long term (current) use of aspirin: Secondary | ICD-10-CM | POA: Diagnosis not present

## 2015-09-14 DIAGNOSIS — M25461 Effusion, right knee: Secondary | ICD-10-CM | POA: Diagnosis present

## 2015-09-14 DIAGNOSIS — G3183 Dementia with Lewy bodies: Secondary | ICD-10-CM | POA: Diagnosis present

## 2015-09-14 DIAGNOSIS — N179 Acute kidney failure, unspecified: Secondary | ICD-10-CM | POA: Diagnosis present

## 2015-09-14 DIAGNOSIS — Z79899 Other long term (current) drug therapy: Secondary | ICD-10-CM | POA: Diagnosis not present

## 2015-09-14 DIAGNOSIS — E876 Hypokalemia: Secondary | ICD-10-CM | POA: Diagnosis present

## 2015-09-14 DIAGNOSIS — E1122 Type 2 diabetes mellitus with diabetic chronic kidney disease: Secondary | ICD-10-CM | POA: Diagnosis present

## 2015-09-14 DIAGNOSIS — J90 Pleural effusion, not elsewhere classified: Secondary | ICD-10-CM | POA: Diagnosis not present

## 2015-09-14 DIAGNOSIS — F17210 Nicotine dependence, cigarettes, uncomplicated: Secondary | ICD-10-CM | POA: Diagnosis present

## 2015-09-14 DIAGNOSIS — W19XXXA Unspecified fall, initial encounter: Secondary | ICD-10-CM

## 2015-09-14 DIAGNOSIS — S8392XA Sprain of unspecified site of left knee, initial encounter: Secondary | ICD-10-CM | POA: Insufficient documentation

## 2015-09-14 DIAGNOSIS — F1290 Cannabis use, unspecified, uncomplicated: Secondary | ICD-10-CM | POA: Diagnosis present

## 2015-09-14 DIAGNOSIS — N183 Chronic kidney disease, stage 3 (moderate): Secondary | ICD-10-CM | POA: Diagnosis present

## 2015-09-14 DIAGNOSIS — R9431 Abnormal electrocardiogram [ECG] [EKG]: Secondary | ICD-10-CM | POA: Insufficient documentation

## 2015-09-14 DIAGNOSIS — Z8673 Personal history of transient ischemic attack (TIA), and cerebral infarction without residual deficits: Secondary | ICD-10-CM

## 2015-09-14 DIAGNOSIS — Z66 Do not resuscitate: Secondary | ICD-10-CM | POA: Diagnosis present

## 2015-09-14 DIAGNOSIS — G629 Polyneuropathy, unspecified: Secondary | ICD-10-CM | POA: Diagnosis present

## 2015-09-14 DIAGNOSIS — I5023 Acute on chronic systolic (congestive) heart failure: Secondary | ICD-10-CM | POA: Diagnosis not present

## 2015-09-14 DIAGNOSIS — Z9119 Patient's noncompliance with other medical treatment and regimen: Secondary | ICD-10-CM

## 2015-09-14 DIAGNOSIS — F0280 Dementia in other diseases classified elsewhere without behavioral disturbance: Secondary | ICD-10-CM | POA: Diagnosis present

## 2015-09-14 DIAGNOSIS — J441 Chronic obstructive pulmonary disease with (acute) exacerbation: Secondary | ICD-10-CM | POA: Diagnosis present

## 2015-09-14 DIAGNOSIS — N289 Disorder of kidney and ureter, unspecified: Secondary | ICD-10-CM

## 2015-09-14 DIAGNOSIS — I313 Pericardial effusion (noninflammatory): Secondary | ICD-10-CM | POA: Diagnosis present

## 2015-09-14 DIAGNOSIS — J45909 Unspecified asthma, uncomplicated: Secondary | ICD-10-CM | POA: Diagnosis present

## 2015-09-14 DIAGNOSIS — I130 Hypertensive heart and chronic kidney disease with heart failure and stage 1 through stage 4 chronic kidney disease, or unspecified chronic kidney disease: Secondary | ICD-10-CM | POA: Diagnosis not present

## 2015-09-14 DIAGNOSIS — I420 Dilated cardiomyopathy: Secondary | ICD-10-CM | POA: Diagnosis not present

## 2015-09-14 DIAGNOSIS — D649 Anemia, unspecified: Secondary | ICD-10-CM | POA: Diagnosis not present

## 2015-09-14 DIAGNOSIS — I472 Ventricular tachycardia: Secondary | ICD-10-CM | POA: Diagnosis present

## 2015-09-14 DIAGNOSIS — G894 Chronic pain syndrome: Secondary | ICD-10-CM | POA: Diagnosis present

## 2015-09-14 DIAGNOSIS — I252 Old myocardial infarction: Secondary | ICD-10-CM

## 2015-09-14 DIAGNOSIS — K219 Gastro-esophageal reflux disease without esophagitis: Secondary | ICD-10-CM | POA: Diagnosis present

## 2015-09-14 DIAGNOSIS — R0902 Hypoxemia: Secondary | ICD-10-CM | POA: Diagnosis present

## 2015-09-14 DIAGNOSIS — R0602 Shortness of breath: Secondary | ICD-10-CM | POA: Diagnosis present

## 2015-09-14 DIAGNOSIS — I5043 Acute on chronic combined systolic (congestive) and diastolic (congestive) heart failure: Secondary | ICD-10-CM | POA: Diagnosis present

## 2015-09-14 DIAGNOSIS — Y92009 Unspecified place in unspecified non-institutional (private) residence as the place of occurrence of the external cause: Secondary | ICD-10-CM

## 2015-09-14 DIAGNOSIS — W19XXXD Unspecified fall, subsequent encounter: Secondary | ICD-10-CM | POA: Diagnosis not present

## 2015-09-14 HISTORY — DX: Disorder of kidney and ureter, unspecified: N28.9

## 2015-09-14 HISTORY — DX: Unspecified asthma, uncomplicated: J45.909

## 2015-09-14 LAB — CBC WITH DIFFERENTIAL/PLATELET
BASOS ABS: 0.1 10*3/uL (ref 0.0–0.1)
Basophils Relative: 1 %
Eosinophils Absolute: 0 10*3/uL (ref 0.0–0.7)
Eosinophils Relative: 0 %
HCT: 26.4 % — ABNORMAL LOW (ref 39.0–52.0)
Hemoglobin: 8.5 g/dL — ABNORMAL LOW (ref 13.0–17.0)
LYMPHS PCT: 33 %
Lymphs Abs: 2.8 10*3/uL (ref 0.7–4.0)
MCH: 32 pg (ref 26.0–34.0)
MCHC: 32.2 g/dL (ref 30.0–36.0)
MCV: 99.2 fL (ref 78.0–100.0)
MONOS PCT: 30 %
Monocytes Absolute: 2.5 10*3/uL — ABNORMAL HIGH (ref 0.1–1.0)
NEUTROS ABS: 3 10*3/uL (ref 1.7–7.7)
Neutrophils Relative %: 36 %
PLATELETS: 196 10*3/uL (ref 150–400)
RBC: 2.66 MIL/uL — AB (ref 4.22–5.81)
RDW: 20.5 % — ABNORMAL HIGH (ref 11.5–15.5)
WBC: 8.4 10*3/uL (ref 4.0–10.5)

## 2015-09-14 LAB — BASIC METABOLIC PANEL
ANION GAP: 12 (ref 5–15)
BUN: 13 mg/dL (ref 6–20)
CO2: 30 mmol/L (ref 22–32)
Calcium: 8.5 mg/dL — ABNORMAL LOW (ref 8.9–10.3)
Chloride: 93 mmol/L — ABNORMAL LOW (ref 101–111)
Creatinine, Ser: 1.41 mg/dL — ABNORMAL HIGH (ref 0.61–1.24)
GFR calc Af Amer: 56 mL/min — ABNORMAL LOW (ref >60)
GFR, EST NON AFRICAN AMERICAN: 48 mL/min — AB (ref >60)
GLUCOSE: 215 mg/dL — AB (ref 65–99)
POTASSIUM: 3 mmol/L — AB (ref 3.5–5.1)
Sodium: 135 mmol/L (ref 135–145)

## 2015-09-14 LAB — BRAIN NATRIURETIC PEPTIDE: B NATRIURETIC PEPTIDE 5: 1230.1 pg/mL — AB (ref 0.0–100.0)

## 2015-09-14 LAB — D-DIMER, QUANTITATIVE (NOT AT ARMC): D DIMER QUANT: 6.36 ug{FEU}/mL — AB (ref 0.00–0.48)

## 2015-09-14 LAB — GLUCOSE, CAPILLARY: Glucose-Capillary: 284 mg/dL — ABNORMAL HIGH (ref 65–99)

## 2015-09-14 LAB — TROPONIN I

## 2015-09-14 MED ORDER — FLUTICASONE FUROATE-VILANTEROL 100-25 MCG/INH IN AEPB: 1.0000 | INHALATION_SPRAY | Freq: Every day | RESPIRATORY_TRACT | Status: DC

## 2015-09-14 MED ORDER — OXYCODONE-ACETAMINOPHEN 5-325 MG PO TABS
1.0000 | ORAL_TABLET | Freq: Two times a day (BID) | ORAL | Status: DC | PRN
  Administered 2015-09-14 – 2015-09-16 (×2): 1 via ORAL
  Filled 2015-09-14 (×3): qty 1

## 2015-09-14 MED ORDER — ENOXAPARIN SODIUM 40 MG/0.4ML ~~LOC~~ SOLN
40.0000 mg | Freq: Every day | SUBCUTANEOUS | Status: DC
  Filled 2015-09-14 (×2): qty 0.4

## 2015-09-14 MED ORDER — METHYLPREDNISOLONE SODIUM SUCC 125 MG IJ SOLR
125.0000 mg | Freq: Once | INTRAMUSCULAR | Status: AC
  Administered 2015-09-14: 125 mg via INTRAVENOUS
  Filled 2015-09-14: qty 2

## 2015-09-14 MED ORDER — SODIUM CHLORIDE 0.9 % IV SOLN: 250.0000 mL | INTRAVENOUS | Status: DC | PRN

## 2015-09-14 MED ORDER — INSULIN ASPART 100 UNIT/ML ~~LOC~~ SOLN
0.0000 [IU] | Freq: Three times a day (TID) | SUBCUTANEOUS | Status: DC
Start: 2015-09-15 — End: 2015-09-16
  Administered 2015-09-15 (×2): 15 [IU] via SUBCUTANEOUS
  Administered 2015-09-15: 5 [IU] via SUBCUTANEOUS
  Administered 2015-09-16: 8 [IU] via SUBCUTANEOUS

## 2015-09-14 MED ORDER — ASPIRIN 81 MG PO CHEW
81.0000 mg | CHEWABLE_TABLET | Freq: Every day | ORAL | Status: DC
  Administered 2015-09-15: 81 mg via ORAL
  Filled 2015-09-14 (×2): qty 1

## 2015-09-14 MED ORDER — POTASSIUM CHLORIDE CRYS ER 20 MEQ PO TBCR
40.0000 meq | EXTENDED_RELEASE_TABLET | Freq: Once | ORAL | Status: AC
  Administered 2015-09-14: 40 meq via ORAL
  Filled 2015-09-14: qty 2

## 2015-09-14 MED ORDER — SODIUM CHLORIDE 0.9 % IJ SOLN: 3.0000 mL | INTRAMUSCULAR | Status: DC | PRN

## 2015-09-14 MED ORDER — IPRATROPIUM-ALBUTEROL 0.5-2.5 (3) MG/3ML IN SOLN
3.0000 mL | Freq: Once | RESPIRATORY_TRACT | Status: AC
  Administered 2015-09-14: 3 mL via RESPIRATORY_TRACT
  Filled 2015-09-14: qty 3

## 2015-09-14 MED ORDER — TRAMADOL HCL 50 MG PO TABS
50.0000 mg | ORAL_TABLET | Freq: Once | ORAL | Status: AC
  Administered 2015-09-14: 50 mg via ORAL
  Filled 2015-09-14: qty 1

## 2015-09-14 MED ORDER — CARVEDILOL 3.125 MG PO TABS
3.1250 mg | ORAL_TABLET | Freq: Two times a day (BID) | ORAL | Status: DC
  Administered 2015-09-15 – 2015-09-16 (×3): 3.125 mg via ORAL
  Filled 2015-09-14 (×3): qty 1

## 2015-09-14 MED ORDER — POTASSIUM CHLORIDE 10 MEQ/100ML IV SOLN
10.0000 meq | Freq: Once | INTRAVENOUS | Status: AC
  Administered 2015-09-14: 10 meq via INTRAVENOUS
  Filled 2015-09-14: qty 100

## 2015-09-14 MED ORDER — FUROSEMIDE 10 MG/ML IJ SOLN
40.0000 mg | Freq: Once | INTRAMUSCULAR | Status: AC
  Administered 2015-09-14: 40 mg via INTRAVENOUS
  Filled 2015-09-14: qty 4

## 2015-09-14 MED ORDER — DOXYCYCLINE HYCLATE 100 MG PO TABS
100.0000 mg | ORAL_TABLET | Freq: Two times a day (BID) | ORAL | Status: DC
  Administered 2015-09-14 – 2015-09-15 (×3): 100 mg via ORAL
  Filled 2015-09-14 (×4): qty 1

## 2015-09-14 MED ORDER — PREGABALIN 100 MG PO CAPS
100.0000 mg | ORAL_CAPSULE | Freq: Two times a day (BID) | ORAL | Status: DC
  Administered 2015-09-14 – 2015-09-15 (×3): 100 mg via ORAL
  Filled 2015-09-14 (×4): qty 1

## 2015-09-14 MED ORDER — FERROUS SULFATE 325 (65 FE) MG PO TABS
325.0000 mg | ORAL_TABLET | Freq: Two times a day (BID) | ORAL | Status: DC
  Administered 2015-09-15 – 2015-09-16 (×3): 325 mg via ORAL
  Filled 2015-09-14 (×3): qty 1

## 2015-09-14 MED ORDER — SODIUM CHLORIDE 0.9 % IJ SOLN
3.0000 mL | Freq: Two times a day (BID) | INTRAMUSCULAR | Status: DC
  Administered 2015-09-15: 3 mL via INTRAVENOUS

## 2015-09-14 MED ORDER — PREDNISONE 50 MG PO TABS
50.0000 mg | ORAL_TABLET | Freq: Every day | ORAL | Status: DC
  Administered 2015-09-15 – 2015-09-16 (×2): 50 mg via ORAL
  Filled 2015-09-14 (×2): qty 1

## 2015-09-14 MED ORDER — IOHEXOL 350 MG/ML SOLN
80.0000 mL | Freq: Once | INTRAVENOUS | Status: AC | PRN
  Administered 2015-09-14: 100 mL via INTRAVENOUS

## 2015-09-14 MED ORDER — SODIUM CHLORIDE 0.9 % IJ SOLN
3.0000 mL | Freq: Two times a day (BID) | INTRAMUSCULAR | Status: DC
  Administered 2015-09-14: 3 mL via INTRAVENOUS

## 2015-09-14 MED ORDER — IPRATROPIUM-ALBUTEROL 0.5-2.5 (3) MG/3ML IN SOLN
3.0000 mL | Freq: Four times a day (QID) | RESPIRATORY_TRACT | Status: DC
  Administered 2015-09-15: 3 mL via RESPIRATORY_TRACT
  Filled 2015-09-14: qty 3

## 2015-09-14 MED ORDER — FUROSEMIDE 40 MG PO TABS
60.0000 mg | ORAL_TABLET | Freq: Two times a day (BID) | ORAL | Status: DC
  Administered 2015-09-15: 60 mg via ORAL
  Filled 2015-09-14 (×2): qty 1

## 2015-09-14 MED ORDER — LISINOPRIL 2.5 MG PO TABS
2.5000 mg | ORAL_TABLET | Freq: Every day | ORAL | Status: DC
  Administered 2015-09-15: 2.5 mg via ORAL
  Filled 2015-09-14: qty 1

## 2015-09-14 MED ORDER — PANTOPRAZOLE SODIUM 40 MG PO TBEC
40.0000 mg | DELAYED_RELEASE_TABLET | Freq: Every day | ORAL | Status: DC
  Administered 2015-09-15: 40 mg via ORAL
  Filled 2015-09-14 (×2): qty 1

## 2015-09-14 NOTE — H&P (Signed)
Family Medicine Teaching [facility] Admission History and Physical Service Pager: [phone]  Patient name: Russell Hamilton Medical record number: [id number] Date of birth: 1941/08/10 Age: 74 y.o. Gender: male  Primary Care Provider: Ambrose Finland, NP Consultants: none  Code Status: DNR    Chief Complaint:  Knee pain, shortness of breath  Assessment and Plan: Russell Hamilton is a 74 y.o. male presenting with COPD/CHF exacerbation . 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 Patient presenting with 2 day hx of SOB and worsening cough. No increase in sputum production. Patient with new requirement of  2 Liters of oxygen, increased RR 20s. Patient with D-Dimer 6.36 and CTA which was negative for PE, however positive for pulmonary edema, bilateral pleural effusions. [facility], where lasix was increased to 60 mg BID (however patient states he is take 120 mg BID). Physical exam was positive for bibasilar crackles, no lower extremity edema note. Patient last ECHO on 07/2015 EF of 30-35 %. BNP 1230, which is lowest it has ever been. Troponin 0.03. Patient likely with COPD exacerbation given hx, patient not currently fluid overloaded on exam and BNP is low compared to past admissions, making CHF exacerbation less likely.  - Admit to Dr. Jennette Kettle, telemetry  - Will start patient on Doxycycline and Prednisone  - Will provide prn albuterol   - Will continue patient home controller med, recently started on Breo  - Will continue home lasix 60 mg BID   # HFrEF. ECHO with EF of 10 to 15%. BNP 1230 on admission (seems to run around 1500-1800). Denies Orthopnea. Trace Lower extremity Edema - Coreg 3.125 twice daily, Lasix 60 mg BID, IMDUR 30 mg, Continue Lisinopril. - Cardiology added spironolactone 25 mg po daily in September not currently on medication list, will investigate - Strict I&Os  - Daily weights     # Normocytic Anemia, Admission hgb 8.5, Baseline hgb 12.0. Patient has never had colonoscopy and declines any in future.  - Will get AM CBC to trend - Will get iron study    # Hypokalemia. K 3.0. KDUR 40 meq.  S/p 10 meq  - Will continue to monitor and replete as necessary - Will get AM BMET   # Acute on CKD. SCr 1.4, Baseline 1-1.2. Patient was taking 240 mg of lasix for the past week. Possibly causing some of patients acute kidney injury  - Will continue to monitor   # Right knee pain: plain films negative for fracture. There is an effusion present.  - cold compresses - percocet 5-325 prn - can consider draining the effusion in the AM given the amount of pain he is in  # Essential hypertension,controlled. BP 118/85 -  Blood pressure stable on combination of Coreg, Imdur, lisinopril   # DM type II. Last A1c 07/13/15 was 8.2. - Will continue SSI and CBGs ACQHS   FEN/GI: Carb modified diet  Prophylaxis: Lovenox   Disposition: Telemetry   History of Present Illness:  Russell Hamilton is a 74 y.o. male presenting with left knee pain and shortness of breath. Says that he fell on the knee Monday or Tuesday. He says he was walking in his apartment and "fell on my ass", says he twisted on the knee and fell. Denies hitting his head. He did not do anything about the knee and the pain continued to get worse and worse so  he decided to come into the ED. Knee pain is located on the front and both sides. Patient has had shortness of breath for about the past 2 days as well. He says that he has been taking lyrica, lasix. He has been having a worsening dry cough as well. He has had SOB before but doesn't remember the last time. He denies any orthopnea. He states he has been taking 120 mg of lasix BID for the past week, this was prescribed by his primary care physician    Patient denies any recent blood loss. He has never had a colonoscopy and does not want to. He denies any blood in his stool, or black  tarry stools.   Review Of Systems: Per HPI with the following additions: denies fevers/chills. No CP. No trouble swallowing. Diarrhea once a week, no constipation  Otherwise the remainder of the systems were negative.  Patient Active Problem List   Diagnosis Date Noted  . Chronic pain syndrome 09/08/2015  . Neutropenia (HCC) 07/13/2015  . CHF (congestive heart failure) (HCC) 07/13/2015  . Acute on chronic combined systolic and diastolic congestive heart failure (HCC) 06/12/2015  . Orthostatic hypotension 05/09/2015  . Tinea pedis 04/26/2015  . Benign paroxysmal positional vertigo 04/12/2015  . GERD (gastroesophageal reflux disease) 04/05/2015  . Dementia with Lewy bodies 04/05/2015  . COPD (chronic obstructive pulmonary disease) (HCC) 03/26/2015  . COPD with acute exacerbation (HCC) 03/26/2015  . Chronic kidney disease, stage 3 03/01/2015  . Type 2 diabetes mellitus with diabetic chronic kidney disease (HCC)   . Unstable angina (HCC) 02/25/2015  . Congestive heart failure (HCC) 02/24/2015  . Cough, persistent 02/08/2015  . Tobacco abuse 02/08/2015  . Acute diastolic CHF (congestive heart failure) (HCC) 12/12/2014  . Acute systolic CHF (congestive heart failure) (HCC) 12/12/2014  . Acute CHF (HCC) 12/11/2014  . AKI (acute kidney injury) (HCC) 11/22/2014  . Hypokalemia 11/21/2014  . Elevated troponin 11/20/2014  . Acute on chronic combined systolic and diastolic heart failure (HCC) 11/20/2014  . Foot ulcer (HCC) 11/20/2014  . NSTEMI (non-ST elevated myocardial infarction) (HCC)   . Pleural effusion   . Acute respiratory failure with hypoxia (HCC)   . Protein-calorie malnutrition, severe (HCC) 08/13/2014  . Chronic systolic CHF (congestive heart failure) (HCC)   . Type II diabetes mellitus with renal manifestations, uncontrolled (HCC)   . Hypertension   . Stroke (HCC)   . Acute systolic CHF (congestive heart failure), NYHA class 4 (HCC) 08/12/2014  . History of stroke 08/12/2014   . HTN (hypertension) 08/12/2014  . Peripheral neuropathy (HCC) 08/12/2014    Past Medical History: Past Medical History  Diagnosis Date  . Hypertension   . Chronic systolic CHF (congestive heart failure) (HCC)     a. 2D ECHO 08/13/14: EF 40%, diffuse hypokinesis possibly worse in the inferior wall. Mild MR, mild LA dilation, PA pressure 50. Trivial pericardial effusion.  . Acute systolic CHF (congestive heart failure), NYHA class 4 (HCC) 08/12/2014  . Type II diabetes mellitus (HCC)   . GERD (gastroesophageal reflux disease)   . Peripheral neuropathy (HCC)     [name] 08/12/2014  . Stroke [facility]) 2010    "memory problems since; right leg and left arm weakness since" (02/08/2015)  . Ulcers of both lower extremities (HCC)     [name] 02/24/2015  . Noncompliance     [name] 02/24/2015  . COPD (chronic obstructive pulmonary disease) (HCC)   . Asthma     Past Surgical History: Past Surgical History  Procedure Laterality Date  . Carotid endarterectomy Right 2010  . Hand ligament reconstruction Right ?1970's    "pinky"  . Foot surgery Left     debridement    2016    Social History: Social History  Substance Use Topics  . Smoking status: Current Every Day Smoker -- 0.50 packs/day for 56 years    Types: Cigarettes  . Smokeless tobacco: Never Used  . Alcohol Use: No     Comment: 02/08/2015 "I might have a beer q 6 months"   Additional social history: smokes marijuana "once in a while" - last use Friday or Saturday Please also refer to relevant sections of EMR.  Family History: Family History  Problem Relation Age of Onset  . Kidney disease Mother   . Heart attack Brother     X 2  . Cancer Brother     Allergies and Medications: No Known Allergies No current facility-administered medications on file prior to encounter.   Current Outpatient Prescriptions on File Prior to Encounter  Medication Sig Dispense Refill  . acetaminophen-codeine (TYLENOL #3) 300-30 MG tablet Take 1 tablet  by mouth every 8 (eight) hours as needed for moderate pain. 40 tablet 1  . albuterol (PROVENTIL HFA;VENTOLIN HFA) 108 (90 BASE) MCG/ACT inhaler Inhale 2 puffs into the lungs every 6 (six) hours as needed for wheezing or shortness of breath. 1 Inhaler 1  . carvedilol (COREG) 3.125 MG tablet Take 1 tablet (3.125 mg total) by mouth 2 (two) times daily with a meal. 60 tablet 2  . ferrous sulfate 325 (65 FE) MG tablet Take 1 tablet (325 mg total) by mouth 2 (two) times daily with a meal. 60 tablet 1  . Fluticasone Furoate-Vilanterol (BREO ELLIPTA) 100-25 MCG/INH AEPB Inhale 1 puff into the lungs daily. 1 each 2  . furosemide (LASIX) 40 MG tablet Take 1.5 tablets (60 mg total) by mouth 2 (two) times daily. 90 tablet 1  . omeprazole (PRILOSEC) 20 MG capsule Take 1 capsule (20 mg total) by mouth daily. 30 capsule 2  . oxyCODONE-acetaminophen (ROXICET) 5-325 MG tablet Take 1 tablet by mouth every 12 (twelve) hours as needed for severe pain. 60 tablet 0  . pregabalin (LYRICA) 100 MG capsule Take 1 capsule (100 mg total) by mouth 2 (two) times daily. 60 capsule 1  . aspirin 81 MG chewable tablet Chew 1 tablet (81 mg total) by mouth daily. 30 tablet 3  . glimepiride (AMARYL) 2 MG tablet Take 1 tablet (2 mg total) by mouth daily with breakfast. (Patient not taking: Reported on 09/14/2015) 30 tablet 2  . ipratropium (ATROVENT) 0.02 % nebulizer solution Take 2.5 mLs (0.5 mg total) by nebulization once. (Patient not taking: Reported on 09/14/2015) 2.5 mL 0  . isosorbide mononitrate (IMDUR) 30 MG 24 hr tablet Take 1 tablet (30 mg total) by mouth daily. (Patient not taking: Reported on 08/17/2015) 30 tablet 2  . lisinopril (PRINIVIL,ZESTRIL) 2.5 MG tablet Take 1 tablet (2.5 mg total) by mouth daily. (Patient not taking: Reported on 08/17/2015) 30 tablet 2  . potassium chloride SA (K-DUR,KLOR-CON) 20 MEQ tablet Take 20 mEq by mouth daily.    . silver sulfADIAZINE (SILVADENE) 1 % cream Apply 1 application topically daily.  (Patient not taking: Reported on 09/14/2015) 50 g 0  . sitaGLIPtin (JANUVIA) 50 MG tablet Take 1 tablet (50 mg total) by mouth daily. For diabetes (Patient not taking: Reported on 09/14/2015) 30 tablet 2    Objective: BP 135/79 mmHg  Pulse 112  Temp(Src)  98.3 F (36.8 C) (Oral)  Resp 28  SpO2 100% Exam: General: Patient lying in bed, moaning at times due to pain in his knee  Eyes: Pupils Equal Round Reactive to light, Conjunctiva without redness or discharge ENTM: Moist mucosa membranes  Neck: No lymphadenopathy noted   Cardiovascular: Tachycardic,  no murmurs, rubs, gallops, 2+ radial pulses  Respiratory: Bibasilar Crackles on exam, no wheezes  Or rhonchi  Abdomen: BS+, no ttp, no rebound  MSK: left knee with anterior effusion, non-erthyematous,  Skin: Scratches on back, no rashes, Psych: Psych:  Cognition and judgment appear intact.  Labs and Imaging: CBC BMET   Recent Labs Lab 09/14/15 1935  WBC 8.4  HGB 8.5*  HCT 26.4*  PLT 196    Recent Labs Lab 09/14/15 1935  NA 135  K 3.0*  CL 93*  CO2 30  BUN 13  CREATININE 1.41*  GLUCOSE 215*  CALCIUM 8.5*      Results for Russell Hamilton, Russell Hamilton (MRN [id number]) as of 09/14/2015 21:45  Ref. Range 09/14/2015 19:35  D-Dimer, Sharene ButtersQuant Latest Ref Range: 0.00-0.48 ug/mL-FEU 6.36 (H)   Results for Russell Hamilton, Russell Hamilton (MRN [id number]) as of 09/14/2015 21:45  Ref. Range 09/14/2015 19:35  B Natriuretic Peptide Latest Ref Range: 0.0-100.0 pg/mL 1230.1 (H)   Results for Russell Hamilton, Russell Hamilton (MRN [id number]) as of 09/14/2015 21:45  Ref. Range 09/14/2015 19:35  Troponin I Latest Ref Range: <0.031 ng/mL <0.03    CT Scan  1. No pulmonary embolus. 2. Congestive heart failure with pulmonary edema and moderate pleural effusions. 3. Small to moderate pericardial effusion.  Chest XRAY 09/14/2015 IMPRESSION: Congestive heart failure with mild pulmonary edema and bilateral pleural effusions.    Asiyah Mayra ReelZahra Mikell, MD 09/14/2015, 9:53 PM PGY-1, [place] FPTS Intern pager: [phone], text pages welcome  I have seen and examined the patient. I have read and agree with the above note. My changes are noted in blue.  Tawni CarnesAndrew Treasure Ochs, MD 09/15/2015, 12:55 AM PGY-3, [facility] FPTS Intern Pager: [phone], text pages welcome

## 2015-09-14 NOTE — ED Notes (Signed)
Upon entering room patient O2 sats at 82% good wave form, pt encouraged to take deep breaths, pt states "I can't breath"; MD Preston FleetingGlick notified and recommends watching the pt without oxygen to see how his O2 sats do. Anything above 88% MD is comfortable with discharging pt home given hx of COPD.

## 2015-09-14 NOTE — ED Notes (Signed)
Reported D-dimer to Dr. Preston FleetingGlick, 6.36, and potassium of 3.0.

## 2015-09-14 NOTE — ED Notes (Signed)
Planning for admission, per Dr. Preston FleetingGlick.

## 2015-09-14 NOTE — ED Notes (Signed)
Report called to Anna, RN.

## 2015-09-14 NOTE — Telephone Encounter (Signed)
Voicemail received from patient who indicated he needed to cancel his appointment today because he fell and hurt his knee. He indicated he "can barely walk."  Updated Dr. Venetia NightAmao who indicated she would like him to make it to his appointment today so his knee can be examined. Return call placed to patient x 2 to determine status, to determine why he fell, and to discuss importance of coming to his appointment with Dr. Venetia NightAmao. Unable to reach patient; voicemail left requesting return call.

## 2015-09-14 NOTE — Telephone Encounter (Signed)
Received return call from patient. He indicated he was in "an incredible amount of pain." He indicated he recently fell in his apartment. He indicated he was dizzy, then he fell and his "knee went under him."  Unable to determine when patient fell as he began coughing and indicated he was experiencing shortness of breath as well.  He indicated he was going to have to go to the hospital. This Case Manager informed patient to get to ED as soon as possible for evaluation of shortness of breath and to get his knee examined. Patient verbalized understanding.

## 2015-09-14 NOTE — ED Notes (Signed)
Pt to CT

## 2015-09-14 NOTE — ED Notes (Signed)
Pt to ED via [facility] - pt complaining of left leg pain from knee radiating down the calf. Pt reports fall "monday or Tuesday." pt yelling in pain. A/o x4.

## 2015-09-14 NOTE — ED Provider Notes (Addendum)
CSN: [id number]     Arrival date & time 09/14/15  1510 History   First MD Initiated Contact with Patient 09/14/15 1515     Chief Complaint  Patient presents with  . Fall  . Leg Pain     (Consider location/radiation/quality/duration/timing/severity/associated sxs/prior Treatment) Patient is a 74 y.o. male presenting with fall and leg pain. The history is provided by the patient.  Fall  Leg Pain He fell at home 2 days ago. He states that he lost his balance and his left leg was underneath him. He is complaining of pain in his left knee radiating down into the left calf. He rates pain at 7/10. He denies other injury.  Past Medical History  Diagnosis Date  . Hypertension   . Chronic systolic CHF (congestive heart failure) (HCC)     a. 2D ECHO 08/13/14: EF 40%, diffuse hypokinesis possibly worse in the inferior wall. Mild MR, mild LA dilation, PA pressure 50. Trivial pericardial effusion.  . Acute systolic CHF (congestive heart failure), NYHA class 4 (HCC) 08/12/2014  . Type II diabetes mellitus (HCC)   . GERD (gastroesophageal reflux disease)   . Peripheral neuropathy (HCC)     [name] 08/12/2014  . Stroke [facility]) 2010    "memory problems since; right leg and left arm weakness since" (02/08/2015)  . Ulcers of both lower extremities (HCC)     [name] 02/24/2015  . Noncompliance     [name] 02/24/2015  . COPD (chronic obstructive pulmonary disease) [facility])    Past Surgical History  Procedure Laterality Date  . Carotid endarterectomy Right 2010  . Hand ligament reconstruction Right ?1970's    "pinky"  . Foot surgery Left     debridement    2016   Family History  Problem Relation Age of Onset  . Kidney disease Mother   . Heart attack Brother     X 2  . Cancer Brother    Social History  Substance Use Topics  . Smoking status: Current Every Day Smoker -- 0.50 packs/day for 56 years    Types: Cigarettes  . Smokeless tobacco: Never Used  . Alcohol Use: No     Comment: 02/08/2015 "I might  have a beer q 6 months"    Review of Systems  All other systems reviewed and are negative.     Allergies  Review of patient's allergies indicates no known allergies.  Home Medications   Prior to Admission medications   Medication Sig Start Date End Date Taking? Authorizing Provider  acetaminophen-codeine (TYLENOL #3) 300-30 MG tablet Take 1 tablet by mouth every 8 (eight) hours as needed for moderate pain. 08/17/15   Jaclyn Shaggy, MD  albuterol (PROVENTIL HFA;VENTOLIN HFA) 108 (90 BASE) MCG/ACT inhaler Inhale 2 puffs into the lungs every 6 (six) hours as needed for wheezing or shortness of breath. 08/17/15   Jaclyn Shaggy, MD  aspirin 81 MG chewable tablet Chew 1 tablet (81 mg total) by mouth daily. 06/20/15   Jaclyn Shaggy, MD  carvedilol (COREG) 3.125 MG tablet Take 1 tablet (3.125 mg total) by mouth 2 (two) times daily with a meal. 08/17/15   Gaylord Shih, MD  ferrous sulfate 325 (65 FE) MG tablet Take 1 tablet (325 mg total) by mouth 2 (two) times daily with a meal. 07/04/15   Jaclyn Shaggy, MD  Fluticasone Furoate-Vilanterol (BREO ELLIPTA) 100-25 MCG/INH AEPB Inhale 1 puff into the lungs daily. 09/07/15   Jaclyn Shaggy, MD  furosemide (LASIX) 40 MG tablet Take 1.5 tablets (60  mg total) by mouth 2 (two) times daily. 09/07/15   Jaclyn Shaggy, MD  glimepiride (AMARYL) 2 MG tablet Take 1 tablet (2 mg total) by mouth daily with breakfast. 07/04/15   Jaclyn Shaggy, MD  ipratropium (ATROVENT) 0.02 % nebulizer solution Take 2.5 mLs (0.5 mg total) by nebulization once. 09/07/15   Jaclyn Shaggy, MD  isosorbide mononitrate (IMDUR) 30 MG 24 hr tablet Take 1 tablet (30 mg total) by mouth daily. Patient not taking: Reported on 08/17/2015 04/19/15   Jaclyn Shaggy, MD  lisinopril (PRINIVIL,ZESTRIL) 2.5 MG tablet Take 1 tablet (2.5 mg total) by mouth daily. Patient not taking: Reported on 08/17/2015 07/04/15   Jaclyn Shaggy, MD  omeprazole (PRILOSEC) 20 MG capsule Take 1 capsule (20 mg total) by mouth daily.  09/12/15   Jaclyn Shaggy, MD  oxyCODONE-acetaminophen (ROXICET) 5-325 MG tablet Take 1 tablet by mouth every 12 (twelve) hours as needed for severe pain. 09/07/15   Jaclyn Shaggy, MD  potassium chloride SA (K-DUR,KLOR-CON) 20 MEQ tablet Take 20 mEq by mouth daily.    Historical Provider, MD  pregabalin (LYRICA) 100 MG capsule Take 1 capsule (100 mg total) by mouth 2 (two) times daily. 09/07/15   Jaclyn Shaggy, MD  silver sulfADIAZINE (SILVADENE) 1 % cream Apply 1 application topically daily. 06/20/15   Vivi Barrack, DPM  sitaGLIPtin (JANUVIA) 50 MG tablet Take 1 tablet (50 mg total) by mouth daily. For diabetes 07/04/15   Jaclyn Shaggy, MD   BP 118/85 mmHg  Pulse 118  Temp(Src) 98.1 F (36.7 C) (Oral)  Resp 20  SpO2 97% Physical Exam  Nursing note and vitals reviewed.  74 year old male, resting comfortably and in no acute distress. Vital signs are significant for tachycardia. Oxygen saturation is 97%, which is normal. Head is normocephalic and atraumatic. PERRLA, EOMI. Oropharynx is clear. Neck is nontender and supple without adenopathy or JVD. Back is nontender and there is no CVA tenderness. Lungs are clear without rales, wheezes, or rhonchi. Chest is nontender. Heart has regular rate and rhythm without murmur. Abdomen is soft, flat, nontender without masses or hepatosplenomegaly and peristalsis is normoactive. Extremities: Moderate effusion is present in the left knee and there is diffuse tenderness around the knee. There is no obvious instability. Lachman test is negative. Unable to perform McMurray's test because of pain. His lower leg is cool to the touch but this is symmetric from side to side and capillary refill is prompt and pulses are palpable. Skin is warm and dry without rash. Neurologic: Mental status is normal, cranial nerves are intact, there are no motor or sensory deficits.  ED Course  Procedures (including critical care time) Lab Review Results for orders placed or  performed during the hospital encounter of 09/14/15  Basic metabolic panel  Result Value Ref Range   Sodium 135 135 - 145 mmol/L   Potassium 3.0 (L) 3.5 - 5.1 mmol/L   Chloride 93 (L) 101 - 111 mmol/L   CO2 30 22 - 32 mmol/L   Glucose, Bld 215 (H) 65 - 99 mg/dL   BUN 13 6 - 20 mg/dL   Creatinine, Ser 1.61 (H) 0.61 - 1.24 mg/dL   Calcium 8.5 (L) 8.9 - 10.3 mg/dL   GFR calc non Af Amer 48 (L) >60 mL/min   GFR calc Af Amer 56 (L) >60 mL/min   Anion gap 12 5 - 15  CBC with Differential  Result Value Ref Range   WBC 8.4 4.0 - 10.5 K/uL   RBC 2.66 (  L) 4.22 - 5.81 MIL/uL   Hemoglobin 8.5 (L) 13.0 - 17.0 g/dL   HCT 95.2 (L) 84.1 - 32.4 %   MCV 99.2 78.0 - 100.0 fL   MCH 32.0 26.0 - 34.0 pg   MCHC 32.2 30.0 - 36.0 g/dL   RDW 40.1 (H) 02.7 - 25.3 %   Platelets 196 150 - 400 K/uL   Neutrophils Relative % 36 %   Lymphocytes Relative 33 %   Monocytes Relative 30 %   Eosinophils Relative 0 %   Basophils Relative 1 %   Neutro Abs 3.0 1.7 - 7.7 K/uL   Lymphs Abs 2.8 0.7 - 4.0 K/uL   Monocytes Absolute 2.5 (H) 0.1 - 1.0 K/uL   Eosinophils Absolute 0.0 0.0 - 0.7 K/uL   Basophils Absolute 0.1 0.0 - 0.1 K/uL   RBC Morphology POLYCHROMASIA PRESENT    WBC Morphology ATYPICAL MONONUCLEAR CELLS    Smear Review LARGE PLATELETS PRESENT   D-dimer, quantitative  Result Value Ref Range   D-Dimer, Quant 6.36 (H) 0.00 - 0.48 ug/mL-FEU  Troponin I  Result Value Ref Range   Troponin I <0.03 <0.031 ng/mL  Brain natriuretic peptide  Result Value Ref Range   B Natriuretic Peptide 1230.1 (H) 0.0 - 100.0 pg/mL   Imaging Review Ct Angio Chest Pe W/cm &/or Wo Cm  09/14/2015  CLINICAL DATA:  Shortness of breath, hypoxia, and cough. EXAM: CT ANGIOGRAPHY CHEST WITH CONTRAST TECHNIQUE: Multidetector CT imaging of the chest was performed using the standard protocol during bolus administration of intravenous contrast. Multiplanar CT image reconstructions and MIPs were obtained to evaluate the vascular anatomy.  CONTRAST:  OMNIPAQUE IOHEXOL 350 MG/ML SOLN COMPARISON:  Radiographs earlier this day. FINDINGS: There are no filling defects within the pulmonary arteries to suggest pulmonary embolus. Multi chamber cardiomegaly. Intravenous contrast refluxes into the IVC. Atherosclerosis of normal caliber thoracic aorta. Coronary artery calcifications are seen. Small to moderate pericardial effusion. Moderate bilateral pleural effusions. No mediastinal or hilar adenopathy. Pulmonary edema with ground-glass opacity in the perihilar lungs and smooth septal thickening. Compressive atelectasis dependently is secondary to pleural effusions no evidence of pneumonia. No pulmonary mass. Mild whole body wall edema. Ingested material in the stomach in the upper abdomen. No acute upper abdominal abnormality. There are no acute or suspicious osseous abnormalities. Degenerative change in the thoracic spine. Review of the MIP images confirms the above findings. IMPRESSION: 1. No pulmonary embolus. 2. Congestive heart failure with pulmonary edema and moderate pleural effusions. 3. Small to moderate pericardial effusion. Electronically Signed   By: Rubye Oaks M.D.   On: 09/14/2015 21:22   Dg Chest Port 1 View  09/14/2015  CLINICAL DATA:  Dyspnea and hypoxia. EXAM: PORTABLE CHEST 1 VIEW COMPARISON:  07/12/2015 FINDINGS: Stable cardiomegaly. Mild interstitial edema, likely slightly more pronounced than on prior exam. Small bilateral pleural effusions. No consolidation to suggest pneumonia. No pneumothorax. Degenerative change in both shoulders. IMPRESSION: Congestive heart failure with mild pulmonary edema and bilateral pleural effusions. Electronically Signed   By: Rubye Oaks M.D.   On: 09/14/2015 18:58   Dg Knee Complete 4 Views Left  09/14/2015  CLINICAL DATA:  Fall.  Knee pain EXAM: LEFT KNEE - COMPLETE 4+ VIEW COMPARISON:  None. FINDINGS: Negative for fracture or dislocation. Early chondrocalcinosis medially and  laterally without significant joint space narrowing. Spurring of the superior patella. Knee joint effusion. Arterial calcification IMPRESSION: Joint effusion.  Mild chondrocalcinosis Negative for fracture Electronically Signed   By: Marlan Palau  M.D.   On: 09/14/2015 16:08   I have personally reviewed and evaluated these lab results and images as part of my medical decision-making.   EKG Interpretation   Date/Time:  Wednesday September 14 2015 22:09:05 EST Ventricular Rate:  109 PR Interval:  154 QRS Duration: 106 QT Interval:  381 QTC Calculation: 513 R Axis:   -46 Text Interpretation:  Sinus tachycardia Probable left atrial enlargement  LAD, consider left anterior fascicular block Anteroseptal infarct, old  Repol abnrm suggests ischemia, lateral leads Prolonged QT interval When  compared with ECG of 07/12/2015, QT has lengthened Confirmed by [facility]  MD,  Myquan Schaumburg ([id number]) on 09/14/2015 10:12:46 PM       MDM   Final diagnoses:  Fall at home, initial encounter  Sprain of left knee, initial encounter  Hypoxia  Renal insufficiency  Normochromic normocytic anemia  Acute on chronic combined systolic and diastolic congestive heart failure (HCC)  Hypokalemia  Prolonged QT interval   Left knee injury. He will be sent for x-rays.  X-rays show evidence of joint effusion consistent with physical exam. Incidental finding of chondrocalcinosis is present. He is placed in a knee immobilizer and is referred to orthopedics for follow-up. He has oxycodone have acetaminophen at home for pain.  Dione Boozeavid Kinslei Labine, MD 09/14/15 1647  As patient was being prepared for discharge, he was noted to be hypoxic. Allergies saturations were running in the low 80s. He does have history of COPD and review of old charts shows he has had oxygen saturations as low as 90% at office visits. He is given albuterol with ipratropium nebulizer treatment.  After 2 nebulizer treatments, oxygen saturation room air still drops down  to below 80%. He continues to be tachycardic. He is started on methylprednisolone and he will clearly need to be admitted. Because of tachycardia and hypoxia, d-dimer will be checked to rule out pulmonary embolism.  D-dimer is come back significantly elevated. He is sent for CT angiogram which showed no evidence of pulmonary embolism but did seem to show congestive heart failure. He was noted to be hypokalemic and was given oral and intravenous potassium. Also noted is a drop in hemoglobin and tries in creatinine from baseline 2 months ago.  He is given furosemide. Case is discussed with practice resident Dr. Cathlean CowerMikell who agrees to admit the patient.  Dione Boozeavid Ceilidh Torregrossa, MD 09/14/15 2219

## 2015-09-14 NOTE — ED Notes (Signed)
Called Dr. Preston FleetingGlick, patient allowed to eat at this time.

## 2015-09-14 NOTE — ED Notes (Signed)
Dr. Glick at the bedside.  

## 2015-09-14 NOTE — ED Notes (Signed)
Pt in radiology 

## 2015-09-14 NOTE — ED Notes (Addendum)
Pt O2 sats remain <88%, even dipping into high 70's at this time. Pt is coughing continuously, reports feeling short of breath. HR @ 115-120. MD Preston FleetingGlick notified. Will assess pt for further recommendation.

## 2015-09-14 NOTE — ED Notes (Signed)
Pt not showing any improvement after breathing treatment, O2 sats remain <85% and pt HR >120 at times. MD Preston FleetingGlick made aware. Given permission to place pt on 2L O2.

## 2015-09-15 ENCOUNTER — Encounter (HOSPITAL_COMMUNITY): Payer: Self-pay | Admitting: Family Medicine

## 2015-09-15 ENCOUNTER — Other Ambulatory Visit (HOSPITAL_COMMUNITY): Payer: Medicare Other

## 2015-09-15 DIAGNOSIS — R9431 Abnormal electrocardiogram [ECG] [EKG]: Secondary | ICD-10-CM | POA: Insufficient documentation

## 2015-09-15 DIAGNOSIS — Y92099 Unspecified place in other non-institutional residence as the place of occurrence of the external cause: Secondary | ICD-10-CM

## 2015-09-15 DIAGNOSIS — J441 Chronic obstructive pulmonary disease with (acute) exacerbation: Secondary | ICD-10-CM

## 2015-09-15 DIAGNOSIS — R0902 Hypoxemia: Secondary | ICD-10-CM

## 2015-09-15 DIAGNOSIS — I420 Dilated cardiomyopathy: Secondary | ICD-10-CM

## 2015-09-15 DIAGNOSIS — N289 Disorder of kidney and ureter, unspecified: Secondary | ICD-10-CM

## 2015-09-15 DIAGNOSIS — D649 Anemia, unspecified: Secondary | ICD-10-CM | POA: Insufficient documentation

## 2015-09-15 DIAGNOSIS — Y92009 Unspecified place in unspecified non-institutional (private) residence as the place of occurrence of the external cause: Secondary | ICD-10-CM | POA: Insufficient documentation

## 2015-09-15 DIAGNOSIS — E876 Hypokalemia: Secondary | ICD-10-CM

## 2015-09-15 DIAGNOSIS — I5043 Acute on chronic combined systolic (congestive) and diastolic (congestive) heart failure: Secondary | ICD-10-CM

## 2015-09-15 DIAGNOSIS — W19XXXA Unspecified fall, initial encounter: Secondary | ICD-10-CM

## 2015-09-15 DIAGNOSIS — I5023 Acute on chronic systolic (congestive) heart failure: Secondary | ICD-10-CM

## 2015-09-15 DIAGNOSIS — I4581 Long QT syndrome: Secondary | ICD-10-CM

## 2015-09-15 LAB — PATHOLOGIST SMEAR REVIEW

## 2015-09-15 LAB — GLUCOSE, CAPILLARY
Glucose-Capillary: 436 mg/dL — ABNORMAL HIGH (ref 65–99)
Glucose-Capillary: 405 mg/dL — ABNORMAL HIGH (ref 65–99)
Glucose-Capillary: 222 mg/dL — ABNORMAL HIGH (ref 65–99)
Glucose-Capillary: 210 mg/dL — ABNORMAL HIGH (ref 65–99)

## 2015-09-15 MED ORDER — CETYLPYRIDINIUM CHLORIDE 0.05 % MT LIQD: 7.0000 mL | Freq: Two times a day (BID) | OROMUCOSAL | Status: DC

## 2015-09-15 MED ORDER — FUROSEMIDE 10 MG/ML IJ SOLN
40.0000 mg | Freq: Two times a day (BID) | INTRAMUSCULAR | Status: DC
  Administered 2015-09-16: 40 mg via INTRAVENOUS
  Filled 2015-09-15: qty 4

## 2015-09-15 MED ORDER — FUROSEMIDE 10 MG/ML IJ SOLN: 40.0000 mg | Freq: Two times a day (BID) | INTRAMUSCULAR | Status: DC

## 2015-09-15 MED ORDER — FUROSEMIDE 10 MG/ML IJ SOLN: 60.0000 mg | Freq: Once | INTRAMUSCULAR | Status: DC

## 2015-09-15 MED ORDER — FUROSEMIDE 10 MG/ML IJ SOLN
30.0000 mg | Freq: Once | INTRAMUSCULAR | Status: AC
  Administered 2015-09-15: 30 mg via INTRAVENOUS
  Filled 2015-09-15: qty 4

## 2015-09-15 MED ORDER — IPRATROPIUM-ALBUTEROL 0.5-2.5 (3) MG/3ML IN SOLN
3.0000 mL | Freq: Three times a day (TID) | RESPIRATORY_TRACT | Status: DC
  Administered 2015-09-15 – 2015-09-16 (×4): 3 mL via RESPIRATORY_TRACT
  Filled 2015-09-15 (×4): qty 3

## 2015-09-15 MED ORDER — LISINOPRIL 5 MG PO TABS
5.0000 mg | ORAL_TABLET | Freq: Every day | ORAL | Status: DC
  Filled 2015-09-15: qty 1

## 2015-09-15 MED ORDER — LISINOPRIL 2.5 MG PO TABS
2.5000 mg | ORAL_TABLET | Freq: Once | ORAL | Status: AC
  Administered 2015-09-15: 2.5 mg via ORAL
  Filled 2015-09-15: qty 1

## 2015-09-15 NOTE — Progress Notes (Signed)
Family Medicine Teaching Service Daily Progress Note Intern Pager: [phone]  Patient name: Russell Hamilton Tew Medical record number: [id number] Date of birth: 10/13/1941 Age: 74 y.o. Gender: male  Primary Care Provider: Ambrose FinlandValerie A Keck, NP Consultants: None  Code Status: DNR   Pt Overview and Major Events to Date:   Assessment and Plan:  Russell Hamilton Colcord is a 74 y.o. male presenting with COPD/CHF exacerbation . 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 Patient presenting with 2 day hx of SOB and worsening cough. No increase in sputum production. Patient with new requirement of 2 Liters of oxygen, increased RR 20s. Patient with D-Dimer 6.36 and CTA which was negative for PE, however positive for pulmonary edema, bilateral pleural effusions. [facility] where lasix was increased to 60 mg BID (however patient states he is take 120 mg BID). Last ECHO on 07/2015 EF of 10-15%. BNP 1230, which is lowest it has ever been. Troponin 0.03. Patient likely with COPD exacerbation given hx, - Continue  Doxycycline and Prednisone  - Will provide prn albuterol  - Will continue Breo   # HFrEF. ECHO with EF of 10 to 15%. BNP 1230 on admission (seems to run around 1500-1800). Denies Orthopnea. Trace Lower extremity Edema. However, with CTA positive for bilateral pleural effusions (seen in the past). Admission weight 157 lbs, weight in 05/2015 150 lbs  - Coreg 3.125 twice daily, Lasix 60 mg BID, IMDUR 30 mg, Continue Lisinopril. - Cardiology added spironolactone 25 mg po daily in September during last hospitalization, however patient left AM. - Strict I&Os, 200L s/p 40 IV lasix  - Daily weights - unchanged for admisson - Will diuresis with 60 po lasix + 30 IV lasix, then 60 IV lasix daily   - Will consult cardiology for heart failure and for moderate pericardial effusions  # Normocytic Anemia, Admission hgb  8.5, Baseline hgb 12.0. Patient has never had colonoscopy and declines any in future.  - No CBC or iron studies as patient does not want any labs done   # Hypokalemia. K 3.0. KDUR 40 meq. S/p 10 meq  - Will continue to monitor and replete as necessary - Will get AM BMET- did not want labs drawn this AM   # Acute on CKD. SCr 1.4, Baseline 1-1.2. Patient was taking 240 mg of lasix for the past week. Possibly causing some of patients acute kidney injury  - Patient does not want labs drawn   # Right knee pain: plain films negative for fracture. There is an effusion present.  - cold compresses - percocet 5-325 prn  # Essential hypertension,controlled. BP 118/85 - Blood pressure stable on combination of Coreg, Imdur, lisinopril   # DM type II. Last A1c 07/13/15 was 8.2. - Will continue SSI and CBGs ACQHS   # Lewy Body Dementia. Patient is stable. Has capacity to make his own decisions at this time   FEN/GI: Carb modified diet  Prophylaxis: Lovenox   Disposition: Telemetry   Subjective:  Patient denies any SOB. Patient denies chest pain. He wants to go home.  He does not want any labs. He does not want to see PT.   Objective: Temp:  [97.8 F (36.6 C)-98.3 F (36.8 C)] 97.8 F (36.6 C) (11/10 0427) Pulse Rate:  [79-122] 109 (11/10 0427) Resp:  [18-32] 18 (11/10 0427) BP: (116-152)/(60-100) 134/84 mmHg (11/10 0427) SpO2:  [83 %-  100 %] 96 % (11/10 0427) Weight:  [157 lb 6.5 oz (71.4 kg)] 157 lb 6.5 oz (71.4 kg) (11/09 2323) Physical Exam: General: Patient lying bed, NAD  Cardiovascular: RRR, no murmurs, rubs, gallops  Respiratory: CTAB, no wheezes, or rhonchi  Abdomen: BS+, no ttp, rebound,  Extremities: Right knee slightly swollen  Laboratory:  Recent Labs Lab 09/14/15 1935  WBC 8.4  HGB 8.5*  HCT 26.4*  PLT 196    Recent Labs Lab 09/14/15 1935  NA 135  K 3.0*  CL 93*  CO2 30  BUN 13  CREATININE 1.41*  CALCIUM 8.5*  GLUCOSE 215*      Imaging/Diagnostic Tests: Ct Angio Chest Pe W/cm &/or Wo Cm  09/14/2015  CLINICAL DATA:  Shortness of breath, hypoxia, and cough. EXAM: CT ANGIOGRAPHY CHEST WITH CONTRAST TECHNIQUE: Multidetector CT imaging of the chest was performed using the standard protocol during bolus administration of intravenous contrast. Multiplanar CT image reconstructions and MIPs were obtained to evaluate the vascular anatomy. CONTRAST:  OMNIPAQUE IOHEXOL 350 MG/ML SOLN COMPARISON:  Radiographs earlier this day. FINDINGS: There are no filling defects within the pulmonary arteries to suggest pulmonary embolus. Multi chamber cardiomegaly. Intravenous contrast refluxes into the IVC. Atherosclerosis of normal caliber thoracic aorta. Coronary artery calcifications are seen. Small to moderate pericardial effusion. Moderate bilateral pleural effusions. No mediastinal or hilar adenopathy. Pulmonary edema with ground-glass opacity in the perihilar lungs and smooth septal thickening. Compressive atelectasis dependently is secondary to pleural effusions no evidence of pneumonia. No pulmonary mass. Mild whole body wall edema. Ingested material in the stomach in the upper abdomen. No acute upper abdominal abnormality. There are no acute or suspicious osseous abnormalities. Degenerative change in the thoracic spine. Review of the MIP images confirms the above findings. IMPRESSION: 1. No pulmonary embolus. 2. Congestive heart failure with pulmonary edema and moderate pleural effusions. 3. Small to moderate pericardial effusion. Electronically Signed   By: Rubye Oaks M.D.   On: 09/14/2015 21:22   Dg Chest Port 1 View  09/14/2015  CLINICAL DATA:  Dyspnea and hypoxia. EXAM: PORTABLE CHEST 1 VIEW COMPARISON:  07/12/2015 FINDINGS: Stable cardiomegaly. Mild interstitial edema, likely slightly more pronounced than on prior exam. Small bilateral pleural effusions. No consolidation to suggest pneumonia. No pneumothorax. Degenerative  change in both shoulders. IMPRESSION: Congestive heart failure with mild pulmonary edema and bilateral pleural effusions. Electronically Signed   By: Rubye Oaks M.D.   On: 09/14/2015 18:58   Dg Knee Complete 4 Views Left  09/14/2015  CLINICAL DATA:  Fall.  Knee pain EXAM: LEFT KNEE - COMPLETE 4+ VIEW COMPARISON:  None. FINDINGS: Negative for fracture or dislocation. Early chondrocalcinosis medially and laterally without significant joint space narrowing. Spurring of the superior patella. Knee joint effusion. Arterial calcification IMPRESSION: Joint effusion.  Mild chondrocalcinosis Negative for fracture Electronically Signed   By: Marlan Palau M.D.   On: 09/14/2015 16:08    Garnet Chatmon Mayra Reel, MD 09/15/2015, 7:19 AM PGY-1, [place] [facility] FPTS Intern pager: [phone], text pages welcome

## 2015-09-15 NOTE — Progress Notes (Signed)
Attempted to talk to patient about DCP/ HHC; patient refused all services stated " I said No!"  CM will continue to follow case for DCP; B Shelba FlakeChandler RN,MHA,BSN [phone]

## 2015-09-15 NOTE — Consult Note (Signed)
Cardiologist: Dr. Newt Minion. Verl Blalock most recently Reason for Consult: CHF, pericardial effusion Referring Physician: Dr. Park Meo Monnier is an 74 y.o. male.  HPI:   Patient 74 year old African-American male with a history of chronic systolic heart failure, type 2 diabetes mellitus, GERD, peripheral neuropathy, stroke, noncompliance, COPD, asthma.  His last echocardiogram was September 2016 revealed an ejection fraction somewhere between 10 and 25% (there are 2 different ranges listed). Mild aortic stenosis, trivial regurgitation. Mitral valve was mild regurgitation. Left atrium mildly dilated. Right ventricular systolic pressure is moderately reduced.  moderate tricuspid regurgitation. Peak PA pressure 66 mmHg. Trivial pericardial effusion.  When he was admitted last year in Oct he adamantly refused a heart cath.     Patient presented with shortness of breath.  CT angiogram revealed small to moderate pericardial effusion. Pulmonary edema and moderate pleural effusion There was no PE.  The patient reports SOB for three days. He also tripped over a bottle of water which resulted in a fall and knee injury. He denies orthopnea of PND.  Both feet have been welling up for the last six months.  The patient currently denies nausea, vomiting, fever, chest pain,  dizziness, cough, congestion, abdominal pain, hematochezia, melena.     Past Medical History  Diagnosis Date  . Hypertension   . Chronic systolic CHF (congestive heart failure) ([place])     a. 2D ECHO 08/13/14: EF 40%, diffuse hypokinesis possibly worse in the inferior wall. Mild MR, mild LA dilation, PA pressure 50. Trivial pericardial effusion.  . Acute systolic CHF (congestive heart failure), NYHA class 4 ([place]) 08/12/2014  . Type II diabetes mellitus ([place])   . GERD (gastroesophageal reflux disease)   . Peripheral neuropathy ([place])     Archie Endo 08/12/2014  . Stroke [facility]) 2010    "memory problems since; right leg and left arm weakness since"  (02/08/2015)  . Ulcers of both lower extremities ([place])     Archie Endo 02/24/2015  . Noncompliance     Archie Endo 02/24/2015  . COPD (chronic obstructive pulmonary disease) ([place])   . Asthma     Past Surgical History  Procedure Laterality Date  . Carotid endarterectomy Right 2010  . Hand ligament reconstruction Right ?1970's    "pinky"  . Foot surgery Left     debridement    2016    Family History  Problem Relation Age of Onset  . Kidney disease Mother   . Heart attack Brother     X 2  . Cancer Brother     Social History:  reports that he has been smoking Cigarettes.  He has a 28 pack-year smoking history. He has never used smokeless tobacco. He reports that he uses illicit drugs (Marijuana). He reports that he does not drink alcohol.  Allergies: No Known Allergies  Medications:  Scheduled Meds: . antiseptic oral rinse  7 mL Mouth Rinse BID  . aspirin  81 mg Oral Daily  . carvedilol  3.125 mg Oral BID WC  . doxycycline  100 mg Oral Q12H  . enoxaparin (LOVENOX) injection  40 mg Subcutaneous QHS  . ferrous sulfate  325 mg Oral BID WC  . Fluticasone Furoate-Vilanterol  1 puff Inhalation Daily  . furosemide  30 mg Intravenous Once  . insulin aspart  0-15 Units Subcutaneous TID WC  . ipratropium-albuterol  3 mL Nebulization TID  . lisinopril  2.5 mg Oral Daily  . pantoprazole  40 mg Oral Daily  . predniSONE  50 mg Oral Q breakfast  .  pregabalin  100 mg Oral BID  . sodium chloride  3 mL Intravenous Q12H  . sodium chloride  3 mL Intravenous Q12H   Continuous Infusions:  PRN Meds:.sodium chloride, oxyCODONE-acetaminophen, sodium chloride   Results for orders placed or performed during the hospital encounter of 09/14/15 (from the past 48 hour(s))  Basic metabolic panel     Status: Abnormal   Collection Time: 09/14/15  7:35 PM  Result Value Ref Range   Sodium 135 135 - 145 mmol/L   Potassium 3.0 (L) 3.5 - 5.1 mmol/L   Chloride 93 (L) 101 - 111 mmol/L   CO2 30 22 - 32 mmol/L    Glucose, Bld 215 (H) 65 - 99 mg/dL   BUN 13 6 - 20 mg/dL   Creatinine, Ser 1.41 (H) 0.61 - 1.24 mg/dL   Calcium 8.5 (L) 8.9 - 10.3 mg/dL   GFR calc non Af Amer 48 (L) >60 mL/min   GFR calc Af Amer 56 (L) >60 mL/min    Comment: (NOTE) The eGFR has been calculated using the CKD EPI equation. This calculation has not been validated in all clinical situations. eGFR's persistently <60 mL/min signify possible Chronic Kidney Disease.    Anion gap 12 5 - 15  CBC with Differential     Status: Abnormal   Collection Time: 09/14/15  7:35 PM  Result Value Ref Range   WBC 8.4 4.0 - 10.5 K/uL   RBC 2.66 (L) 4.22 - 5.81 MIL/uL   Hemoglobin 8.5 (L) 13.0 - 17.0 g/dL   HCT 26.4 (L) 39.0 - 52.0 %   MCV 99.2 78.0 - 100.0 fL   MCH 32.0 26.0 - 34.0 pg   MCHC 32.2 30.0 - 36.0 g/dL   RDW 20.5 (H) 11.5 - 15.5 %   Platelets 196 150 - 400 K/uL   Neutrophils Relative % 36 %   Lymphocytes Relative 33 %   Monocytes Relative 30 %   Eosinophils Relative 0 %   Basophils Relative 1 %   Neutro Abs 3.0 1.7 - 7.7 K/uL   Lymphs Abs 2.8 0.7 - 4.0 K/uL   Monocytes Absolute 2.5 (H) 0.1 - 1.0 K/uL   Eosinophils Absolute 0.0 0.0 - 0.7 K/uL   Basophils Absolute 0.1 0.0 - 0.1 K/uL   RBC Morphology POLYCHROMASIA PRESENT    WBC Morphology ATYPICAL MONONUCLEAR CELLS    Smear Review LARGE PLATELETS PRESENT   D-dimer, quantitative     Status: Abnormal   Collection Time: 09/14/15  7:35 PM  Result Value Ref Range   D-Dimer, Quant 6.36 (H) 0.00 - 0.48 ug/mL-FEU    Comment:        AT THE INHOUSE ESTABLISHED CUTOFF VALUE OF 0.48 ug/mL FEU, THIS ASSAY HAS BEEN DOCUMENTED IN THE LITERATURE TO HAVE A SENSITIVITY AND NEGATIVE PREDICTIVE VALUE OF AT LEAST 98 TO 99%.  THE TEST RESULT SHOULD BE CORRELATED WITH AN ASSESSMENT OF THE CLINICAL PROBABILITY OF DVT / VTE.   Troponin I     Status: None   Collection Time: 09/14/15  7:35 PM  Result Value Ref Range   Troponin I <0.03 <0.031 ng/mL    Comment:        NO INDICATION  OF MYOCARDIAL INJURY.   Brain natriuretic peptide     Status: Abnormal   Collection Time: 09/14/15  7:35 PM  Result Value Ref Range   B Natriuretic Peptide 1230.1 (H) 0.0 - 100.0 pg/mL  Pathologist smear review     Status: None   Collection  Time: 09/14/15  7:35 PM  Result Value Ref Range   Path Review Reviewed By Violet Baldy, M.D.     Comment: 11.10.16 NORMOCYTIC ANEMIA,MONOCYTOSIS AND CIRCULATING BLASTS. HEMATOLOGIC EVALUATION RECOMMENDED. Performed at [facility]   Glucose, capillary     Status: Abnormal   Collection Time: 09/14/15 11:22 PM  Result Value Ref Range   Glucose-Capillary 284 (H) 65 - 99 mg/dL  Glucose, capillary     Status: Abnormal   Collection Time: 09/15/15  6:23 AM  Result Value Ref Range   Glucose-Capillary 436 (H) 65 - 99 mg/dL  Glucose, capillary     Status: Abnormal   Collection Time: 09/15/15 11:40 AM  Result Value Ref Range   Glucose-Capillary 405 (H) 65 - 99 mg/dL    Ct Angio Chest Pe W/cm &/or Wo Cm  09/14/2015  CLINICAL DATA:  Shortness of breath, hypoxia, and cough. EXAM: CT ANGIOGRAPHY CHEST WITH CONTRAST TECHNIQUE: Multidetector CT imaging of the chest was performed using the standard protocol during bolus administration of intravenous contrast. Multiplanar CT image reconstructions and MIPs were obtained to evaluate the vascular anatomy. CONTRAST:  11m OMNIPAQUE IOHEXOL 350 MG/ML SOLN COMPARISON:  Radiographs earlier this day. FINDINGS: There are no filling defects within the pulmonary arteries to suggest pulmonary embolus. Multi chamber cardiomegaly. Intravenous contrast refluxes into the IVC. Atherosclerosis of normal caliber thoracic aorta. Coronary artery calcifications are seen. Small to moderate pericardial effusion. Moderate bilateral pleural effusions. No mediastinal or hilar adenopathy. Pulmonary edema with ground-glass opacity in the perihilar lungs and smooth septal thickening. Compressive atelectasis dependently is  secondary to pleural effusions no evidence of pneumonia. No pulmonary mass. Mild whole body wall edema. Ingested material in the stomach in the upper abdomen. No acute upper abdominal abnormality. There are no acute or suspicious osseous abnormalities. Degenerative change in the thoracic spine. Review of the MIP images confirms the above findings. IMPRESSION: 1. No pulmonary embolus. 2. Congestive heart failure with pulmonary edema and moderate pleural effusions. 3. Small to moderate pericardial effusion. Electronically Signed   By: MJeb LeveringM.D.   On: 09/14/2015 21:22   Dg Chest Port 1 View  09/14/2015  CLINICAL DATA:  Dyspnea and hypoxia. EXAM: PORTABLE CHEST 1 VIEW COMPARISON:  07/12/2015 FINDINGS: Stable cardiomegaly. Mild interstitial edema, likely slightly more pronounced than on prior exam. Small bilateral pleural effusions. No consolidation to suggest pneumonia. No pneumothorax. Degenerative change in both shoulders. IMPRESSION: Congestive heart failure with mild pulmonary edema and bilateral pleural effusions. Electronically Signed   By: MJeb LeveringM.D.   On: 09/14/2015 18:58   Dg Knee Complete 4 Views Left  09/14/2015  CLINICAL DATA:  Fall.  Knee pain EXAM: LEFT KNEE - COMPLETE 4+ VIEW COMPARISON:  None. FINDINGS: Negative for fracture or dislocation. Early chondrocalcinosis medially and laterally without significant joint space narrowing. Spurring of the superior patella. Knee joint effusion. Arterial calcification IMPRESSION: Joint effusion.  Mild chondrocalcinosis Negative for fracture Electronically Signed   By: CFranchot GalloM.D.   On: 09/14/2015 16:08    Review of Systems  Constitutional: Negative for fever and diaphoresis.  HENT: Negative for congestion and sore throat.   Respiratory: Positive for cough and shortness of breath. Negative for wheezing.   Cardiovascular: Negative for chest pain, palpitations, orthopnea, leg swelling and PND.  Gastrointestinal: Negative for  nausea, vomiting, abdominal pain, blood in stool and melena.  Genitourinary: Negative for hematuria.  Musculoskeletal: Positive for myalgias and joint pain (left knee).  Neurological: Negative for dizziness.  All other systems reviewed and are negative.  Blood pressure 137/76, pulse 97, temperature 97.8 F (36.6 C), temperature source Oral, resp. rate 18, height 5' 9"  (1.753 m), weight 157 lb 6.5 oz (71.4 kg), SpO2 94 %. Physical Exam  Nursing note and vitals reviewed.  Well nourished, well developed, in no acute distress HEENT: Pupils are equal round react to light accommodation extraocular movements are intact.  Neck: Elevated JVD.  Negative Kussmaul sign. No cervical lymphadenopathy. Cardiac: Regular rate and rhythm without murmurs rubs or gallops. Lungs:  Decreased BS on the left base, E to Transition.  no wheezing, rhonchi or rales Abd: soft, nontender, positive bowel sounds all quadrants Ext: Trace pedal edema.  2+ radial and dorsalis pedis pulses. Skin: warm and dry Neuro:  Grossly normal   Assessment/Plan:  Active Problems:       Pericardial Effusion, small to moderate   Pleural Effusion, moderate   NSVT   Hypokalemia   Acute on chronic Sys and diastolic HF   Dilated cardiomyopathy  74 year old African-American male with a history of chronic systolic heart failure, type 2 diabetes mellitus, GERD, peripheral neuropathy, stroke, noncompliance, COPD, asthma.  His last echocardiogram was September 2016 revealed an ejection fraction somewhere between 10 and 25% (there are 2 different ranges listed).  He presented with 3 days of SOB.  He has bilateral pleural effusions and a pericardial effusion.  He was given one dose of 43m IV lasix.  He is oc Coreg 3.125 bid and lisinopril 2.5.  We will increase the ACE to 5.057m  Start IV lasix 4058mID.  Echo is pending.  Depending how on much fluid is diuresed before the echo, he may need a follow up limited echo.  Titrate coreg if BP  allows at a later time.  His NVST are very short episodes.  Consider adding back aldactone as well.  Monitor SCr.     HAGTarri FullerACSugarcreek/08/2015, 12:35 PM    I have seen, examined and evaluated the patient this PM along with Mr. HagSamara SnideA.UtahAfter reviewing all the available data and chart,  I agree with his  findings, examination as well as impression recommendations.  Pt with known Dilated Cardiomyopathy who has refused invasive evaluation. On minimal medications presents with 3 d of worsening dyspnea.  Has JVD & Pleural effusion, but minimal edema.  BNP level is 1200 --> While COPD exacerbation is playing a role, he most likely has Acute on Chronic Combined Systolic & Diastolic HF.  Agree with IV Diuresis - increase to at lest 40 mg iv BID.  Increase Afterload - lisinopril to 5mg11mOnce more euvolemic - increase Coreg & consider spironolactone.  He has EF ~15-25% -- has echo ordered (may not be necessary, but will help predict volume status).  Will follow along.   HARDLeonie ManD., M.S. Interventional Cardiologist   Pager # [phone]

## 2015-09-15 NOTE — Progress Notes (Signed)
Pt is refusing the bed alarm, despite RN explaining the importance of it, especially since he is c/o dizziness, pt told to sit on the bed until getting his bearing's straight before getting up to urinate and told to call if dizzy, pt agreed to do this, will continue to monitor, Thanks Lavonda JumboMike F RN

## 2015-09-15 NOTE — Progress Notes (Signed)
Utilization review completed. Navon Kotowski, RN, BSN. 

## 2015-09-15 NOTE — Hospital Discharge Follow-Up (Signed)
[facility] [place]:  Patient known to the [facility] [place]. Met with patient at bedside. Patient agreeable to continued follow-up with Dr. Jarold Song after discharge.  Appointment scheduled on 09/23/15 at 1430.  Appointment placed on AVS.  Discussed home health services and informed patient he would benefit from home health RN (for symptom management, medication management), and patient declined. He indicated he "would not consider it." Olga Coaster, RN CM updated. Will continue to follow patient's medical progress closely.

## 2015-09-15 NOTE — Progress Notes (Signed)
Inpatient Diabetes Program Recommendations  AACE/ADA: New Consensus Statement on Inpatient Glycemic Control (2015)  Target Ranges:  Prepandial:   less than 140 mg/dL      Peak postprandial:   less than 180 mg/dL (1-2 hours)      Critically ill patients:  140 - 180 mg/dL   Review of Glycemic Control  Diabetes history: DM 2 Outpatient Diabetes medications: Amaryl 2 mg Daily Current orders for Inpatient glycemic control: Novolog Moderate TID  Inpatient Diabetes Program Recommendations: Insulin - Basal: Glucose currently in the 400's. Patient received 125 mg of Solumedrol x 1 dose. Patient now on Prednisone 50 mg Daily. Please consider ordering Levemir 14 units Daily. May have to taper as steroids are tapered.     Thanks,  Christena DeemShannon Kandi Brusseau RN, MSN, [facility] Inpatient Diabetes Coordinator Team Pager [phone] (8a-5p)

## 2015-09-15 NOTE — Progress Notes (Signed)
Patient blood sugar 436 this morning.Patient continues to refuse lab draw.Spoke with Dr.Mikell stated give patient 15 units novolog .

## 2015-09-15 NOTE — Progress Notes (Signed)
PT Cancellation Note  Patient Details Name: [name] MRN: [id number] DOB: 12/15/1940   Cancelled Treatment:    Reason Eval/Treat Not Completed: Patient declined, no reason specified (Reports he wants to go home).  Nursing notified and will try again tomorrow   Ivar DrapeStout, Ladean Steinmeyer E 09/15/2015, 8:26 AM   Samul Dadauth Neomi Laidler, PT MS Acute Rehab Dept. Number: [facility] [id number] and [facility] [phone]

## 2015-09-15 NOTE — Progress Notes (Signed)
Patient refusing Echocardiogram.  Echo lab staff and Dr. Voncille LoAsiyah notified.

## 2015-09-15 NOTE — Progress Notes (Signed)
[id number] Lab in to collect morning blood work.Patient refusing lab draw.Patient stated,"Not this morning." Dr.Mikell notified.

## 2015-09-15 NOTE — Progress Notes (Signed)
Patient arrived to [address]  Bed 24  from emergency room.Assisted to bed by nursing staff,Patient moaning complaining left knee pain.Patient has knee immobilizer intact to left leg.Patient allowed me to remove to look at left knee abrasion noted.Ice pack and knee immobilizer reapplied.patient refusing to allow this nurse to perform skin assessment check.Explained to patient hospital policy for all patient to have skin assessment to check for skin breakdown and wounds.So that we could treat them.Patient stated,"I don't have any skin breakdown or wounds.Multimedia programmer"Charge nurse Rashida notified.She tried to talk with patient but he's still refusing skin assessment check.Patient blood sugar elevated 284.Patient does not have bedtime sliding scale coverage ordered.Will notify MD.Patient stated,"If you call doctor tonight I'm still not taking any insulin.Just leave me alone I'm going to try to sleep." Dr.Miikell notified.

## 2015-09-16 ENCOUNTER — Telehealth: Payer: Self-pay

## 2015-09-16 ENCOUNTER — Inpatient Hospital Stay (HOSPITAL_COMMUNITY): Payer: Medicare Other

## 2015-09-16 DIAGNOSIS — N289 Disorder of kidney and ureter, unspecified: Secondary | ICD-10-CM | POA: Insufficient documentation

## 2015-09-16 DIAGNOSIS — J90 Pleural effusion, not elsewhere classified: Secondary | ICD-10-CM

## 2015-09-16 DIAGNOSIS — S8392XA Sprain of unspecified site of left knee, initial encounter: Secondary | ICD-10-CM | POA: Insufficient documentation

## 2015-09-16 DIAGNOSIS — W19XXXD Unspecified fall, subsequent encounter: Secondary | ICD-10-CM

## 2015-09-16 LAB — GLUCOSE, CAPILLARY
GLUCOSE-CAPILLARY: 288 mg/dL — AB (ref 65–99)
Glucose-Capillary: 245 mg/dL — ABNORMAL HIGH (ref 65–99)

## 2015-09-16 MED ORDER — FUROSEMIDE 40 MG PO TABS: 60.0000 mg | ORAL_TABLET | Freq: Two times a day (BID) | ORAL | Status: DC

## 2015-09-16 NOTE — Progress Notes (Signed)
Inpatient Diabetes Program Recommendations  AACE/ADA: New Consensus Statement on Inpatient Glycemic Control (2015)  Target Ranges:  Prepandial:   less than 140 mg/dL      Peak postprandial:   less than 180 mg/dL (1-2 hours)      Critically ill patients:  140 - 180 mg/dL   Review of Glycemic Control  Diabetes history: DM 2 Outpatient Diabetes medications: Amaryl 2 mg Daily Current orders for Inpatient glycemic control: Novolog Moderate TID  Inpatient Diabetes Program Recommendations: Insulin - Basal: Glucose currently in the 200-400 range. Patient received 125 mg of Solumedrol x 1 dose. Patient now on Prednisone 50 mg Daily. Please consider ordering Levemir 12-14 units Daily. May have to taper as steroids are tapered.    Thanks,  Christena DeemShannon Minela [place] RN, MSN, [facility] Inpatient Diabetes Coordinator Team Pager [phone] (8a-5p)

## 2015-09-16 NOTE — Progress Notes (Signed)
OT Cancellation Note  Patient Details Name: Nat MathJames Courtney MRN: [id number] DOB: 03/25/1941   Cancelled Treatment:    Reason Eval/Treat Not Completed: Patient declined, no reason specified. Pt adamantly refusing to answer questions or OOB activity. Will follow.  Evern BioMayberry, Elius Etheredge Lynn 09/16/2015, 11:10 AM

## 2015-09-16 NOTE — Progress Notes (Signed)
Pt continues to refuse 1000 meds;  Dr. Wende MottMcKeag was at bedside and is aware.  Pt also continues to refuse labs.

## 2015-09-16 NOTE — Progress Notes (Signed)
Family Medicine Teaching Service Daily Progress Note Intern Pager: [phone]  Patient name: Russell Hamilton Medical record number: [id number] Date of birth: 05/15/1941 Age: 74 y.o. Gender: male  Primary Care Provider: Ambrose FinlandValerie A Keck, NP Consultants: None  Code Status: DNR   Pt Overview and Major Events to Date:   Assessment and Plan:  Russell MathJames Vales is a 74 y.o. male presenting with COPD/CHF exacerbation . 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 Patient on room air, denies any dyspena, Physical exam CTAB, trace pitting edema. ECHO with EF of 10 to 15%. BNP 1230 on admission (seems to run around 1500-1800). Denies Orthopnea. Trace Lower extremity Edema. However, with CTA positive for bilateral pleural effusions (seen in the past). Admission weight 157 lbs, weight in 05/2015 150 lbs  - Coreg 3.125 twice daily, Lasix 60 mg BID, IMDUR 30 mg, Continue Lisinopril. - Cardiology recommends adding spironolactone 25 mg, however not able to determine current potassium levels, also patient has been refusing meds  - Strict I&Os, 650 ml, unmeasured output  - Daily weights -  166 lbs up from previous weight 157lbs  - Cardiology recs include 40 IV lasix BID, patient refusing labs, so will transition to PO lasix  - Refused ECHO yesterday afternoon, not willing to get it this AM   # COPD exacerbation. Patient presenting with 2 day hx of SOB and worsening cough. No increase in sputum production. Patient with new requirement of 2 Liters of oxygen, increased RR 20s. Patient with D-Dimer 6.36 and CTA which was negative for PE, however positive for pulmonary edema, bilateral pleural effusions. Patient was [facility] on 11/2, where lasix was increased to 60 mg BID (however patient states he is take 120 mg BID) - Continue  Doxycycline and Prednisone  - Will provide prn albuterol  - Will continue Breo   # Normocytic Anemia, Admission  hgb 8.5, Baseline hgb 12.0. Patient has never had colonoscopy and declines any in future.  - No CBC or iron studies as patient does not want any labs done  - Not able to trend hgb at this time   # Hypokalemia. K 3.0. KDUR 40 meq. S/p 10 meq  -  Not able to trend K as patient is refusing lab draws   # Acute on CKD. SCr 1.4, Baseline 1-1.2. Patient was taking 240 mg of lasix for the past week. Possibly causing some of patients acute kidney injury  - Patient does not want labs drawn   # Right knee pain: plain films negative for fracture. There is an effusion present.  - cold compresses - percocet 5-325 prn  # Essential hypertension,controlled. BP 118/85 - Blood pressure stable on combination of Coreg, Imdur, lisinopril   # DM type II. Last A1c 07/13/15 was 8.2. - Will continue SSI and CBGs ACQHS   # Lewy Body Dementia. Patient is stable. Has capacity to make his own decisions at this time   FEN/GI: Carb modified diet  Prophylaxis: Lovenox   Disposition: Telemetry   Subjective:  Patient refusing blood draws this AM. States he will not get "fucking blood draws." He doesn't care why we want them. He hates needles and that is why he refusing blood draws. Explained that this was necessary to continue diuresis, however patient did not care.    Objective: Temp:  [97.3 F (36.3 C)-98.1 F (36.7 C)] 97.5 F (36.4 C) (11/11 0425) Pulse Rate:  [  91-105] 91 (11/11 0425) Resp:  [18-20] 18 (11/11 0425) BP: (93-137)/(53-76) 103/63 mmHg (11/11 0425) SpO2:  [92 %-97 %] 93 % (11/11 0425) Weight:  [166 lb 11.2 oz (75.615 kg)] 166 lb 11.2 oz (75.615 kg) (11/11 0425) Physical Exam: General: Patient lying bed, NAD  Cardiovascular: RRR, no murmurs, rubs, gallops  Respiratory: CTAB, no wheezes, or rhonchi  Abdomen: BS+, no ttp, rebound,  Extremities: Trace lower extremity edema   Laboratory:  Recent Labs Lab 09/14/15 1935  WBC 8.4  HGB 8.5*  HCT 26.4*  PLT 196    Recent  Labs Lab 09/14/15 1935  NA 135  K 3.0*  CL 93*  CO2 30  BUN 13  CREATININE 1.41*  CALCIUM 8.5*  GLUCOSE 215*     Imaging/Diagnostic Tests: Ct Angio Chest Pe W/cm &/or Wo Cm  09/14/2015  CLINICAL DATA:  Shortness of breath, hypoxia, and cough. EXAM: CT ANGIOGRAPHY CHEST WITH CONTRAST TECHNIQUE: Multidetector CT imaging of the chest was performed using the standard protocol during bolus administration of intravenous contrast. Multiplanar CT image reconstructions and MIPs were obtained to evaluate the vascular anatomy. CONTRAST:  OMNIPAQUE IOHEXOL 350 MG/ML SOLN COMPARISON:  Radiographs earlier this day. FINDINGS: There are no filling defects within the pulmonary arteries to suggest pulmonary embolus. Multi chamber cardiomegaly. Intravenous contrast refluxes into the IVC. Atherosclerosis of normal caliber thoracic aorta. Coronary artery calcifications are seen. Small to moderate pericardial effusion. Moderate bilateral pleural effusions. No mediastinal or hilar adenopathy. Pulmonary edema with ground-glass opacity in the perihilar lungs and smooth septal thickening. Compressive atelectasis dependently is secondary to pleural effusions no evidence of pneumonia. No pulmonary mass. Mild whole body wall edema. Ingested material in the stomach in the upper abdomen. No acute upper abdominal abnormality. There are no acute or suspicious osseous abnormalities. Degenerative change in the thoracic spine. Review of the MIP images confirms the above findings. IMPRESSION: 1. No pulmonary embolus. 2. Congestive heart failure with pulmonary edema and moderate pleural effusions. 3. Small to moderate pericardial effusion. Electronically Signed   By: Rubye Oaks M.D.   On: 09/14/2015 21:22   Dg Chest Port 1 View  09/14/2015  CLINICAL DATA:  Dyspnea and hypoxia. EXAM: PORTABLE CHEST 1 VIEW COMPARISON:  07/12/2015 FINDINGS: Stable cardiomegaly. Mild interstitial edema, likely slightly more pronounced than on  prior exam. Small bilateral pleural effusions. No consolidation to suggest pneumonia. No pneumothorax. Degenerative change in both shoulders. IMPRESSION: Congestive heart failure with mild pulmonary edema and bilateral pleural effusions. Electronically Signed   By: Rubye Oaks M.D.   On: 09/14/2015 18:58   Dg Knee Complete 4 Views Left  09/14/2015  CLINICAL DATA:  Fall.  Knee pain EXAM: LEFT KNEE - COMPLETE 4+ VIEW COMPARISON:  None. FINDINGS: Negative for fracture or dislocation. Early chondrocalcinosis medially and laterally without significant joint space narrowing. Spurring of the superior patella. Knee joint effusion. Arterial calcification IMPRESSION: Joint effusion.  Mild chondrocalcinosis Negative for fracture Electronically Signed   By: Marlan Palau M.D.   On: 09/14/2015 16:08    Dabney Dever Mayra Reel, MD 09/16/2015, 7:23 AM PGY-1, [facility] Family Medicine FPTS Intern pager: [phone], text pages welcome

## 2015-09-16 NOTE — Progress Notes (Signed)
Patient Name: Russell Hamilton Date of Encounter: 09/16/2015  Primary Cardiologist: Dr. Sherril CongNelson/Dr. Daleen SquibbWall most recently   Active Problems:   Acute exacerbation of CHF (congestive heart failure) (HCC)   COPD exacerbation (HCC)   Fall at home   Hypoxia   Normochromic normocytic anemia   Prolonged QT interval   Renal insufficiency    SUBJECTIVE  Patient was annoyed when discussing compliance. Did not want labs unless it is draw through peripheral IV. Says SOB is much better. Never had any CP.    CURRENT MEDS . antiseptic oral rinse  7 mL Mouth Rinse BID  . aspirin  81 mg Oral Daily  . carvedilol  3.125 mg Oral BID WC  . doxycycline  100 mg Oral Q12H  . enoxaparin (LOVENOX) injection  40 mg Subcutaneous QHS  . ferrous sulfate  325 mg Oral BID WC  . furosemide  40 mg Intravenous BID  . insulin aspart  0-15 Units Subcutaneous TID WC  . ipratropium-albuterol  3 mL Nebulization TID  . lisinopril  5 mg Oral Daily  . pantoprazole  40 mg Oral Daily  . predniSONE  50 mg Oral Q breakfast  . pregabalin  100 mg Oral BID  . sodium chloride  3 mL Intravenous Q12H  . sodium chloride  3 mL Intravenous Q12H    OBJECTIVE  Filed Vitals:   09/15/15 2300 09/16/15 0009 09/16/15 0425 09/16/15 0821  BP: 97/59 96/53 103/63 106/57  Pulse: 92 97 91 99  Temp:  98.1 F (36.7 C) 97.5 F (36.4 C) 97.8 F (36.6 C)  TempSrc:  Oral Oral Oral  Resp:  20 18 20   Height:      Weight:   166 lb 11.2 oz (75.615 kg)   SpO2:  93% 93% 89%    Intake/Output Summary (Last 24 hours) at 09/16/15 0855 Last data filed at 09/16/15 0851  Gross per 24 hour  Intake   1083 ml  Output    650 ml  Net    433 ml   Filed Weights   09/14/15 2323 09/16/15 0425  Weight: 157 lb 6.5 oz (71.4 kg) 166 lb 11.2 oz (75.615 kg)    PHYSICAL EXAM  General: Abrasive, NAD. Neuro: Alert and oriented X 3. Moves all extremities spontaneously. Psych: Normal affect. HEENT:  Normal  Neck: Supple without bruits or JVD. Lungs:  Resp  regular and unlabored, CTA, decreased breath sound in L base Heart: RRR no s3, s4, or murmurs. Abdomen: Soft, non-tender, non-distended, BS + x 4.  Extremities: No clubbing, cyanosis or edema. DP/PT/Radials 2+ and equal bilaterally.  Accessory Clinical Findings  CBC  Recent Labs  09/14/15 1935  WBC 8.4  NEUTROABS 3.0  HGB 8.5*  HCT 26.4*  MCV 99.2  PLT 196   Basic Metabolic Panel  Recent Labs  09/14/15 1935  NA 135  K 3.0*  CL 93*  CO2 30  GLUCOSE 215*  BUN 13  CREATININE 1.41*  CALCIUM 8.5*   Cardiac Enzymes  Recent Labs  09/14/15 1935  TROPONINI <0.03   BNP Invalid input(s): POCBNP D-Dimer  Recent Labs  09/14/15 1935  DDIMER 6.36*    TELE NSR    ECG  No new EKG  Echocardiogram 07/13/2015  LV EF: 20% -  25%  ------------------------------------------------------------------- Indications:   CHF - 428.0.  ------------------------------------------------------------------- History:  PMH:  Dyspnea. Congestive heart failure. Chronic obstructive pulmonary disease. Risk factors: Current tobacco use. Diabetes mellitus.  ------------------------------------------------------------------- Study Conclusions  - Prior tests and procedures:  EF was 30-35% on 02-27-2015. - Left ventricle: The cavity size was moderately dilated. There was mild concentric hypertrophy. Systolic function was severely reduced. The estimated ejection fraction was in the range of 10 to 15%. Diffuse hypokinesis. Doppler parameters are consistent with restrictive physiology, indicative of decreased left ventricular diastolic compliance and/or increased left atrial pressure. Doppler parameters are consistent with elevated ventricular end-diastolic filling pressure. - Ventricular septum: Septal motion showed paradox. - Aortic valve: Valve mobility was restricted. There was mild stenosis. There was trivial regurgitation. Valve area (VTI): 1.22 cm^2.  Valve area (Vmax): 1.13 cm^2. Valve area (Vmean): 1.11 cm^2. - Mitral valve: Calcified annulus. Mildly thickened leaflets . There was mild regurgitation. - Left atrium: The atrium was mildly dilated. - Right ventricle: The cavity size was mildly dilated. Wall thickness was normal. Systolic function was moderately reduced. - Tricuspid valve: There was moderate regurgitation. - Pulmonary arteries: Systolic pressure was severely increased. PA peak pressure: 66 mm Hg (S). - Pericardium, extracardiac: A trivial pericardial effusion was identified posterior to the heart. Features were not consistent with tamponade physiology.  Impressions:  - Compared to the prior study from 02/27/2015 th eleft ventricle is now moderately dilated with severely decreased systolic function and estimated LVEF 10-15%, previously 30-35%. Right ventricular function is moderately impaired.    Radiology/Studies  Ct Angio Chest Pe W/cm &/or Wo Cm  09/14/2015  CLINICAL DATA:  Shortness of breath, hypoxia, and cough. EXAM: CT ANGIOGRAPHY CHEST WITH CONTRAST TECHNIQUE: Multidetector CT imaging of the chest was performed using the standard protocol during bolus administration of intravenous contrast. Multiplanar CT image reconstructions and MIPs were obtained to evaluate the vascular anatomy. CONTRAST:  OMNIPAQUE IOHEXOL 350 MG/ML SOLN COMPARISON:  Radiographs earlier this day. FINDINGS: There are no filling defects within the pulmonary arteries to suggest pulmonary embolus. Multi chamber cardiomegaly. Intravenous contrast refluxes into the IVC. Atherosclerosis of normal caliber thoracic aorta. Coronary artery calcifications are seen. Small to moderate pericardial effusion. Moderate bilateral pleural effusions. No mediastinal or hilar adenopathy. Pulmonary edema with ground-glass opacity in the perihilar lungs and smooth septal thickening. Compressive atelectasis dependently is secondary to pleural  effusions no evidence of pneumonia. No pulmonary mass. Mild whole body wall edema. Ingested material in the stomach in the upper abdomen. No acute upper abdominal abnormality. There are no acute or suspicious osseous abnormalities. Degenerative change in the thoracic spine. Review of the MIP images confirms the above findings. IMPRESSION: 1. No pulmonary embolus. 2. Congestive heart failure with pulmonary edema and moderate pleural effusions. 3. Small to moderate pericardial effusion. Electronically Signed   By: Rubye Oaks M.D.   On: 09/14/2015 21:22   Dg Chest Port 1 View  09/14/2015  CLINICAL DATA:  Dyspnea and hypoxia. EXAM: PORTABLE CHEST 1 VIEW COMPARISON:  07/12/2015 FINDINGS: Stable cardiomegaly. Mild interstitial edema, likely slightly more pronounced than on prior exam. Small bilateral pleural effusions. No consolidation to suggest pneumonia. No pneumothorax. Degenerative change in both shoulders. IMPRESSION: Congestive heart failure with mild pulmonary edema and bilateral pleural effusions. Electronically Signed   By: Rubye Oaks M.D.   On: 09/14/2015 18:58   Dg Knee Complete 4 Views Left  09/14/2015  CLINICAL DATA:  Fall.  Knee pain EXAM: LEFT KNEE - COMPLETE 4+ VIEW COMPARISON:  None. FINDINGS: Negative for fracture or dislocation. Early chondrocalcinosis medially and laterally without significant joint space narrowing. Spurring of the superior patella. Knee joint effusion. Arterial calcification IMPRESSION: Joint effusion.  Mild chondrocalcinosis Negative for fracture Electronically Signed  By: Marlan Palau M.D.   On: 09/14/2015 16:08    ASSESSMENT AND PLAN  1. Acute on chronic combined systolic and diastolic heart failure  - CTA of chest 09/14/2015 negative for PE, noted congestive HF with pulm edema, small to moderate pericardial effusion  - EF has gradually declined from 40% on echo 08/13/2014, down to 30-35% on echo 02/27/2015, most recent echo 07/13/2015 EF 10-15% (some  discrepancy with report saying EF 20-25%). When he was admitted last year in Oct he adamantly refused a heart cath  - on IV diuresis. He has not been compliant even in the hospital, refused echo, refused bed alarm, refused all labs. Blood glucose >400 yesterday.  - discussed with patient about importance of lab to help guide medication, he told me he would only agree to it if we draw the lab through existing peripheral IV.   - based on physical exam, likely near euvolemic level based on physical exam (may still has a little L pleural effusion). Compliance will continue to be a problem.   2. Anemia: hgb 8.5 on arrival, was 10.7 two month ago 3. DM 4. H/o stroke 5. H/o noncompliance 6. COPD  Signed, Azalee Course PA-C Pager: [phone]  I have seen, examined and evaluated the patient this AM along with Mr. Lisabeth Devoid, [place].  After reviewing all the available data and chart,  I agree with his findings, examination as well as impression recommendations. Principal Problem:   Acute on chronic combined systolic and diastolic congestive heart failure (HCC) Active Problems:   Pleural effusion   COPD exacerbation (HCC)   Fall at home   Hypoxia   Normochromic normocytic anemia   Prolonged QT interval   Acute renal insufficiency  - On Low dose Coreg & Lisinopril with borderline BPs - would not titrate further.  Unable to treat accurately b/c unwilling to have labs checked.  Unable to measure I/O.    Appears to be close to euvolemic - L LL sounds related to effusion (if plan is to diurese off the effusion, will take a while & could potentially do with intermittent increased doses of PO lasix).  ? Anemia w/u per primary team -- with severely reduced LVEF, would potentially benefit from IV Iron vs. Transfusion (not sure he would agree to this).  Since we cannot be sure of renal fxn - would agree with converting to PO Lasix by tomorrow.   Marykay Lex, M.D., M.S. Interventional Cardiologist   Pager #  [phone]

## 2015-09-16 NOTE — Discharge Summary (Signed)
Family Medicine Teaching [facility] Discharge Summary  Patient name: Russell Hamilton Medical record number: [id number] Date of birth: [birth year] Age: 74 y.o. Gender: male Date of Admission: 09/14/2015  Date of Discharge: 09/16/2015 Admitting Physician: Nestor Ramp, MD  Primary Care Provider: Ambrose Finland, NP Consultants: Heart failure team   Indication for Hospitalization: CHF exacerbation  Discharge Diagnoses/Problem List:  Patient Active Problem List   Diagnosis Date Noted  . Acute renal insufficiency 09/16/2015  . COPD exacerbation (HCC) 09/14/2015  . CHF (congestive heart failure) (HCC) 07/13/2015  . Acute on chronic combined systolic and diastolic congestive heart failure (HCC) 06/12/2015  . Dementia with Lewy bodies 04/05/2015  . COPD (chronic obstructive pulmonary disease) (HCC) 03/26/2015  . COPD with acute exacerbation (HCC) 03/26/2015  . Type 2 diabetes mellitus with diabetic chronic kidney disease (HCC)   . Acute CHF (HCC) 12/11/2014  . Hypokalemia 11/21/2014  . Acute on chronic combined systolic and diastolic heart failure (HCC) 11/20/2014  . Pleural effusion    Disposition: Home, AMA   Discharge Condition: Patient left AMA   Discharge Exam:  General: Patient lying bed, NAD  Cardiovascular: RRR, no murmurs, rubs, gallops  Respiratory: CTAB, no wheezes, or rhonchi  Abdomen: BS+, no ttp, rebound,  Extremities: Trace lower extremity edema   Brief Hospital Course:  Patient was admitted for acute respiratory hypoxia likely due to CHF exacerbation.  On admission, patient with a 2 Liter nasal cannula oxygen requirement. Patient received treatment initially for COPD exacerbation with doxycycline and prednisone. However, patient weight was 157 lbs up from previous weight earlier in the year of 150 lbs. Patient also noted to have CTA positive for bilateral pleural effusions and small pericardial effusion, as well as pulmonary edema. Patient received IV lasix over the  course of next two days. Heart failure team was consulted. However patient refused blood draws, and due to inability to monitor patient's potential kidney injury and electrolytes with his refusal of blood draws, patient was transitioned over to po lasix at home dose. Patient also refused other treatments including ECHO and treatment of diabetes with insulin. Patient then left AMA, after physician tried to discuss his consistent decline of medical treatments while in the hospital.   Issues for Follow Up:  1. Please follow up on patient's fluid status. Patient received some diuresis in the hospital however had to be stopped prematurely as patient left AMA 2. On admission patient was hypokalemic, no recheck during stay as patient refused blood draws throughout stay, would follow up with BMET  3. Patient's hgb was 8.5 on admission, unable to follow up patient's hemoglobin during stay for same reason above, please follow up with CBC  4. Patient with elevated SCr on admission, diuresis with IV lasix x 2 days, however unable to recheck SCr so transferred back to PO lasix, as no way to monitor for worsening renal insufficiency, please follow up as possible  Significant Procedures: None, ECHO was pending patient refused imaging during admission   Significant Labs and Imaging:   Recent Labs Lab 09/14/15 1935  WBC 8.4  HGB 8.5*  HCT 26.4*  PLT 196    Recent Labs Lab 09/14/15 1935  NA 135  K 3.0*  CL 93*  CO2 30  GLUCOSE 215*  BUN 13  CREATININE 1.41*  CALCIUM 8.5*    Results/Tests Pending at Time of Discharge: None   Discharge Medications:    Medication List    TAKE these medications  acetaminophen-codeine 300-30 MG tablet  Commonly known as:  TYLENOL #3  Take 1 tablet by mouth every 8 (eight) hours as needed for moderate pain.     albuterol 108 (90 BASE) MCG/ACT inhaler  Commonly known as:  PROVENTIL HFA;VENTOLIN HFA  Inhale 2 puffs into the lungs every 6 (six) hours as  needed for wheezing or shortness of breath.     aspirin 81 MG chewable tablet  Chew 1 tablet (81 mg total) by mouth daily.     carvedilol 3.125 MG tablet  Commonly known as:  COREG  Take 1 tablet (3.125 mg total) by mouth 2 (two) times daily with a meal.     ferrous sulfate 325 (65 FE) MG tablet  Take 1 tablet (325 mg total) by mouth 2 (two) times daily with a meal.     Fluticasone Furoate-Vilanterol 100-25 MCG/INH Aepb  Commonly known as:  BREO ELLIPTA  Inhale 1 puff into the lungs daily.     furosemide 40 MG tablet  Commonly known as:  LASIX  Take 1.5 tablets (60 mg total) by mouth 2 (two) times daily.     gabapentin 300 MG capsule  Commonly known as:  NEURONTIN  Take 600 mg by mouth 2 (two) times daily.     glimepiride 2 MG tablet  Commonly known as:  AMARYL  Take 1 tablet (2 mg total) by mouth daily with breakfast.     ipratropium 0.02 % nebulizer solution  Commonly known as:  ATROVENT  Take 2.5 mLs (0.5 mg total) by nebulization once.     isosorbide mononitrate 30 MG 24 hr tablet  Commonly known as:  IMDUR  Take 1 tablet (30 mg total) by mouth daily.     lisinopril 2.5 MG tablet  Commonly known as:  PRINIVIL,ZESTRIL  Take 1 tablet (2.5 mg total) by mouth daily.     omeprazole 20 MG capsule  Commonly known as:  PRILOSEC  Take 1 capsule (20 mg total) by mouth daily.     oxyCODONE-acetaminophen 5-325 MG tablet  Commonly known as:  ROXICET  Take 1 tablet by mouth every 12 (twelve) hours as needed for severe pain.     potassium chloride SA 20 MEQ tablet  Commonly known as:  K-DUR,KLOR-CON  Take 20 mEq by mouth daily.     pregabalin 100 MG capsule  Commonly known as:  LYRICA  Take 1 capsule (100 mg total) by mouth 2 (two) times daily.     silver sulfADIAZINE 1 % cream  Commonly known as:  SILVADENE  Apply 1 application topically daily.     sitaGLIPtin 50 MG tablet  Commonly known as:  JANUVIA  Take 1 tablet (50 mg total) by mouth daily. For diabetes         Discharge Instructions: Please refer to Patient Instructions section of EMR for full details.  Patient was counseled important signs and symptoms that should prompt return to medical care, changes in medications, dietary instructions, activity restrictions, and follow up appointments.   Follow-Up Appointments: Follow-up Information    Schedule an appointment as soon as possible for a visit with Sheral ApleyMURPHY, TIMOTHY D, MD.   Specialty:  Orthopedic Surgery   Contact information:   [address] [place] [id number] [phone]       [facility].   Why:  Appointment on 09/23/15 at 2:30 pm with Dr. Venetia NightAmao.   Contact information:   [address]  [place] [address] [phone]      Freedom Lopezperez Mayra Reel, MD 09/16/2015, 7:58 PM PGY-1, [facility]

## 2015-09-16 NOTE — Progress Notes (Signed)
Pt refused labs this am.

## 2015-09-16 NOTE — Progress Notes (Signed)
MD informed that pt refused insulin admin; CBG = 245.

## 2015-09-16 NOTE — Telephone Encounter (Signed)
Received phone call from patient indicating he needed a refill of his Lasix. Informed patient that since he is inpatient his medications will need to be prescribed by discharging provider. Patient indicated he had already left hospital and was not given any prescriptions.  Reviewed chart and spoke with Jiles CrockerBrenda Chandler, RN CM who indicated patient left AMA. Informed Dr. Venetia NightAmao patient left AMA. She indicated she wrote a script for lasix on 09/07/15, and patient must not have picked up medication. She also indicated since he left hospital AMA, it would be best for him to take the dosage of lasix she prescribed on 09/07/15. [facility], lasix waiting for patient pick-up. Placed call to patient; inquired about leaving AMA and patient indicated "they wanted me out of there." Discussed importance of compliance; patient kept wanting to change subject. Informed patient that Dr. Venetia NightAmao prescribed lasix 60 mg twice daily, and [facility].  Patient verbalized understanding.

## 2015-09-16 NOTE — Progress Notes (Signed)
Nutrition Brief Note  Patient identified on the Malnutrition Screening Tool (MST) Report  Wt Readings from Last 15 Encounters:  09/16/15 166 lb 11.2 oz (75.615 kg)  08/17/15 156 lb (70.761 kg)  08/02/15 160 lb (72.576 kg)  07/25/15 157 lb (71.215 kg)  07/14/15 142 lb 4.8 oz (64.547 kg)  07/04/15 150 lb (68.04 kg)  06/20/15 146 lb 9.6 oz (66.497 kg)  06/17/15 149 lb (67.586 kg)  06/14/15 140 lb 14.4 oz (63.912 kg)  05/27/15 150 lb 3.2 oz (68.13 kg)  05/20/15 154 lb (69.854 kg)  05/18/15 135 lb 12.8 oz (61.598 kg)  05/06/15 147 lb (66.679 kg)  04/26/15 167 lb (75.751 kg)  04/19/15 151 lb 9.6 oz (68.765 kg)    Body mass index is 24.61 kg/(m^2). Patient meets criteria for Normal Weight based on current BMI.   Current diet order is Heart Healthy, patient is consuming approximately 100% of meals at this time. 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 He states his appetite is fine, he has been eating well, and has been maintaining his weight. He denies any questions or concerns and declines nutrition education. Labs and medications reviewed.   No nutrition interventions warranted at this time. If nutrition issues arise, please consult RD.   Dorothea Ogleeanne Rashema Seawright RD, LDN Inpatient Clinical Dietitian Pager: [phone] After Hours Pager: [phone]

## 2015-09-16 NOTE — Care Management Important Message (Signed)
Important Message  Patient Details  Name: Russell Hamilton MRN: [id number] Date of Birth: 05/16/1941   Medicare Important Message Given:  Yes    Russell Hamilton P Churchill Grimsley 09/16/2015, 2:22 PM

## 2015-09-16 NOTE — Progress Notes (Signed)
Pt has been non-compliant, refusing lab draw, refusing medication.  Just wants to be "left alone."  Numerous physicians have spoken to him, trying to encourage him to allow us to treat him, and he continues to refuse.  Pt has now agreed to leave AMA.  IV has been discontinued.  Telemetry has been discontinued and CCMD been notified.  Pt in NAD.

## 2015-09-16 NOTE — Progress Notes (Signed)
Pt a/o, c/o back pain, PRN meds given as ordered as well as hot pack to relieve pain, pt now resting comfortably in bed, VSS, pt stable

## 2015-09-16 NOTE — Discharge Instructions (Signed)
You were admitted with CHF exacerbation You left the hospital against medical advise. Please continue home Lasix, would continue 60 mg BID. Please follow up with your primary care physicians    Wear the immobilizer as needed. Take your oxycodone-acetaminophen as needed for pain.  Knee Sprain A knee sprain is a tear in one of the strong, fibrous tissues that connect the bones (ligaments) in your knee. The severity of the sprain depends on how much of the ligament is torn. The tear can be either partial or complete. CAUSES  Often, sprains are a result of a fall or injury. The force of the impact causes the fibers of your ligament to stretch too much. This excess tension causes the fibers of your ligament to tear. SIGNS AND SYMPTOMS  You may have some loss of motion in your knee. Other symptoms include:  Bruising.  Pain in the knee area.  Tenderness of the knee to the touch.  Swelling. DIAGNOSIS  To diagnose a knee sprain, your health care provider will physically examine your knee. Your health care provider may also suggest an X-ray exam of your knee to make sure no bones are broken. TREATMENT  If your ligament is only partially torn, treatment usually involves keeping the knee in a fixed position (immobilization) or bracing your knee for activities that require movement for several weeks. To do this, your health care provider will apply a bandage, cast, or splint to keep your knee from moving and to support your knee during movement until it heals. For a partially torn ligament, the healing process usually takes 4-6 weeks. If your ligament is completely torn, depending on which ligament it is, you may need surgery to reconnect the ligament to the bone or reconstruct it. After surgery, a cast or splint may be applied and will need to stay on your knee for 4-6 weeks while your ligament heals. HOME CARE INSTRUCTIONS  Keep your injured knee elevated to decrease swelling.  To ease pain and  swelling, apply ice to the injured area:  Put ice in a plastic bag.  Place a towel between your skin and the bag.  Leave the ice on for 20 minutes, 2-3 times a day.  Only take medicine for pain as directed by your health care provider.  Do not leave your knee unprotected until pain and stiffness go away (usually 4-6 weeks).  If you have a cast or splint, do not allow it to get wet. If you have been instructed not to remove it, cover it with a plastic bag when you shower or bathe. Do not swim.  Your health care provider may suggest exercises for you to do during your recovery to prevent or limit permanent weakness and stiffness. SEEK IMMEDIATE MEDICAL CARE IF:  Your cast or splint becomes damaged.  Your pain becomes worse.  You have significant pain, swelling, or numbness below the cast or splint. MAKE SURE YOU:  Understand these instructions.  Will watch your condition.  Will get help right away if you are not doing well or get worse.   This information is not intended to replace advice given to you by your health care provider. Make sure you discuss any questions you have with your health care provider.   Document Released: 10/22/2005 Document Revised: 11/12/2014 Document Reviewed: 06/03/2013 Elsevier Interactive Patient Education 2016 [employer]  Fall Prevention in the Home  Falls can cause injuries and can affect people from all age groups. There are many simple things that you  can do to make your home safe and to help prevent falls. WHAT CAN I DO ON THE OUTSIDE OF MY HOME?  Regularly repair the edges of walkways and driveways and fix any cracks.  Remove high doorway thresholds.  Trim any shrubbery on the main path into your home.  Use bright outdoor lighting.  Clear walkways of debris and clutter, including tools and rocks.  Regularly check that handrails are securely fastened and in good repair. Both sides of any steps should have handrails.  Install  guardrails along the edges of any raised decks or porches.  Have leaves, snow, and ice cleared regularly.  Use sand or salt on walkways during winter months.  In the garage, clean up any spills right away, including grease or oil spills. WHAT CAN I DO IN THE BATHROOM?  Use night lights.  Install grab bars by the toilet and in the tub and shower. Do not use towel bars as grab bars.  Use non-skid mats or decals on the floor of the tub or shower.  If you need to sit down while you are in the shower, use a plastic, non-slip stool.Marland Kitchen  Keep the floor dry. Immediately clean up any water that spills on the floor.  Remove soap buildup in the tub or shower on a regular basis.  Attach bath mats securely with double-sided non-slip rug tape.  Remove throw rugs and other tripping hazards from the floor. WHAT CAN I DO IN THE BEDROOM?  Use night lights.  Make sure that a bedside light is easy to reach.  Do not use oversized bedding that drapes onto the floor.  Have a firm chair that has side arms to use for getting dressed.  Remove throw rugs and other tripping hazards from the floor. WHAT CAN I DO IN THE KITCHEN?   Clean up any spills right away.  Avoid walking on wet floors.  Place frequently used items in easy-to-reach places.  If you need to reach for something above you, use a sturdy step stool that has a grab bar.  Keep electrical cables out of the way.  Do not use floor polish or wax that makes floors slippery. If you have to use wax, make sure that it is non-skid floor wax.  Remove throw rugs and other tripping hazards from the floor. WHAT CAN I DO IN THE STAIRWAYS?  Do not leave any items on the stairs.  Make sure that there are handrails on both sides of the stairs. Fix handrails that are broken or loose. Make sure that handrails are as long as the stairways.  Check any carpeting to make sure that it is firmly attached to the stairs. Fix any carpet that is loose or  worn.  Avoid having throw rugs at the top or bottom of stairways, or secure the rugs with carpet tape to prevent them from moving.  Make sure that you have a light switch at the top of the stairs and the bottom of the stairs. If you do not have them, have them installed. WHAT ARE SOME OTHER FALL PREVENTION TIPS?  Wear closed-toe shoes that fit well and support your feet. Wear shoes that have rubber soles or low heels.  When you use a stepladder, make sure that it is completely opened and that the sides are firmly locked. Have someone hold the ladder while you are using it. Do not climb a closed stepladder.  Add color or contrast paint or tape to grab bars and handrails in your  home. Place contrasting color strips on the first and last steps.  Use mobility aids as needed, such as canes, walkers, scooters, and crutches.  Turn on lights if it is dark. Replace any light bulbs that burn out.  Set up furniture so that there are clear paths. Keep the furniture in the same spot.  Fix any uneven floor surfaces.  Choose a carpet design that does not hide the edge of steps of a stairway.  Be aware of any and all pets.  Review your medicines with your healthcare provider. Some medicines can cause dizziness or changes in blood pressure, which increase your risk of falling. Talk with your health care provider about other ways that you can decrease your risk of falls. This may include working with a physical therapist or trainer to improve your strength, balance, and endurance.   This information is not intended to replace advice given to you by your health care provider. Make sure you discuss any questions you have with your health care provider.   Document Released: 10/12/2002 Document Revised: 03/08/2015 Document Reviewed: 11/26/2014 Elsevier Interactive Patient Education [employer]

## 2015-09-19 ENCOUNTER — Telehealth: Payer: Self-pay

## 2015-09-19 NOTE — Telephone Encounter (Signed)
Called the patient to check on his status.  His voice was very hoarse when he answered the phone.  He denied any shortness of breath, dizziness, chest pain and said that his only problem is the hoarse voice and coughing.  He said that he [facility] the [facility] on 09/16/15 and he has been taking all of his medications as ordered. He also confirmed that he is taking the lasix 60 mg twice a day.  This CM reminded him that he has an appointment with [name] on 09/23/15 @ 1430 and he said that he was aware. He was interested in an earlier appointment if one was available but as per Esmond Plantsecilia deLuna, [facility] scheduler, there are no earlier appointments. This CM informed the patient that [facility] could [facility] to inquire about any earlier appointments and he kept saying " that's okay."  He then repeated multiple times " I'm fine, I'm fine."   He reported no other problems/questions.

## 2015-09-20 ENCOUNTER — Inpatient Hospital Stay (HOSPITAL_COMMUNITY)
Admission: EM | Admit: 2015-09-20 | Discharge: 2015-09-27 | DRG: 291 | Disposition: A | Discharge status: Hospice | Payer: Medicare Other | Attending: Family Medicine | Admitting: Family Medicine

## 2015-09-20 ENCOUNTER — Encounter (HOSPITAL_COMMUNITY): Payer: Self-pay | Admitting: Emergency Medicine

## 2015-09-20 ENCOUNTER — Emergency Department (HOSPITAL_COMMUNITY): Payer: Medicare Other

## 2015-09-20 DIAGNOSIS — F329 Major depressive disorder, single episode, unspecified: Secondary | ICD-10-CM | POA: Diagnosis present

## 2015-09-20 DIAGNOSIS — Z79899 Other long term (current) drug therapy: Secondary | ICD-10-CM

## 2015-09-20 DIAGNOSIS — I959 Hypotension, unspecified: Secondary | ICD-10-CM | POA: Diagnosis not present

## 2015-09-20 DIAGNOSIS — I252 Old myocardial infarction: Secondary | ICD-10-CM

## 2015-09-20 DIAGNOSIS — Z515 Encounter for palliative care: Secondary | ICD-10-CM | POA: Insufficient documentation

## 2015-09-20 DIAGNOSIS — Z66 Do not resuscitate: Secondary | ICD-10-CM | POA: Diagnosis not present

## 2015-09-20 DIAGNOSIS — I5023 Acute on chronic systolic (congestive) heart failure: Secondary | ICD-10-CM | POA: Diagnosis present

## 2015-09-20 DIAGNOSIS — I509 Heart failure, unspecified: Secondary | ICD-10-CM | POA: Diagnosis present

## 2015-09-20 DIAGNOSIS — Z91199 Patient's noncompliance with other medical treatment and regimen due to unspecified reason: Secondary | ICD-10-CM

## 2015-09-20 DIAGNOSIS — F17210 Nicotine dependence, cigarettes, uncomplicated: Secondary | ICD-10-CM | POA: Diagnosis present

## 2015-09-20 DIAGNOSIS — I159 Secondary hypertension, unspecified: Secondary | ICD-10-CM | POA: Diagnosis present

## 2015-09-20 DIAGNOSIS — R569 Unspecified convulsions: Secondary | ICD-10-CM | POA: Diagnosis not present

## 2015-09-20 DIAGNOSIS — Z9119 Patient's noncompliance with other medical treatment and regimen: Secondary | ICD-10-CM

## 2015-09-20 DIAGNOSIS — N289 Disorder of kidney and ureter, unspecified: Secondary | ICD-10-CM | POA: Diagnosis present

## 2015-09-20 DIAGNOSIS — K219 Gastro-esophageal reflux disease without esophagitis: Secondary | ICD-10-CM | POA: Diagnosis present

## 2015-09-20 DIAGNOSIS — Z8249 Family history of ischemic heart disease and other diseases of the circulatory system: Secondary | ICD-10-CM | POA: Diagnosis not present

## 2015-09-20 DIAGNOSIS — E1142 Type 2 diabetes mellitus with diabetic polyneuropathy: Secondary | ICD-10-CM | POA: Diagnosis present

## 2015-09-20 DIAGNOSIS — N183 Chronic kidney disease, stage 3 (moderate): Secondary | ICD-10-CM | POA: Diagnosis present

## 2015-09-20 DIAGNOSIS — G6289 Other specified polyneuropathies: Secondary | ICD-10-CM | POA: Diagnosis not present

## 2015-09-20 DIAGNOSIS — I130 Hypertensive heart and chronic kidney disease with heart failure and stage 1 through stage 4 chronic kidney disease, or unspecified chronic kidney disease: Principal | ICD-10-CM | POA: Diagnosis present

## 2015-09-20 DIAGNOSIS — F0631 Mood disorder due to known physiological condition with depressive features: Secondary | ICD-10-CM | POA: Diagnosis present

## 2015-09-20 DIAGNOSIS — R609 Edema, unspecified: Secondary | ICD-10-CM

## 2015-09-20 DIAGNOSIS — Z8673 Personal history of transient ischemic attack (TIA), and cerebral infarction without residual deficits: Secondary | ICD-10-CM | POA: Diagnosis not present

## 2015-09-20 DIAGNOSIS — D638 Anemia in other chronic diseases classified elsewhere: Secondary | ICD-10-CM | POA: Diagnosis present

## 2015-09-20 DIAGNOSIS — J45909 Unspecified asthma, uncomplicated: Secondary | ICD-10-CM | POA: Diagnosis present

## 2015-09-20 DIAGNOSIS — Z7984 Long term (current) use of oral hypoglycemic drugs: Secondary | ICD-10-CM | POA: Diagnosis not present

## 2015-09-20 DIAGNOSIS — I5043 Acute on chronic combined systolic (congestive) and diastolic (congestive) heart failure: Secondary | ICD-10-CM | POA: Diagnosis not present

## 2015-09-20 DIAGNOSIS — J449 Chronic obstructive pulmonary disease, unspecified: Secondary | ICD-10-CM | POA: Diagnosis present

## 2015-09-20 DIAGNOSIS — S52121A Displaced fracture of head of right radius, initial encounter for closed fracture: Secondary | ICD-10-CM | POA: Diagnosis present

## 2015-09-20 DIAGNOSIS — E1122 Type 2 diabetes mellitus with diabetic chronic kidney disease: Secondary | ICD-10-CM | POA: Diagnosis present

## 2015-09-20 DIAGNOSIS — Z7982 Long term (current) use of aspirin: Secondary | ICD-10-CM | POA: Diagnosis not present

## 2015-09-20 DIAGNOSIS — J42 Unspecified chronic bronchitis: Secondary | ICD-10-CM | POA: Diagnosis not present

## 2015-09-20 DIAGNOSIS — G3183 Dementia with Lewy bodies: Secondary | ICD-10-CM | POA: Diagnosis present

## 2015-09-20 DIAGNOSIS — G894 Chronic pain syndrome: Secondary | ICD-10-CM | POA: Diagnosis present

## 2015-09-20 DIAGNOSIS — E871 Hypo-osmolality and hyponatremia: Secondary | ICD-10-CM | POA: Diagnosis present

## 2015-09-20 DIAGNOSIS — E1110 Type 2 diabetes mellitus with ketoacidosis without coma: Secondary | ICD-10-CM | POA: Insufficient documentation

## 2015-09-20 DIAGNOSIS — Z9114 Patient's other noncompliance with medication regimen: Secondary | ICD-10-CM | POA: Diagnosis not present

## 2015-09-20 DIAGNOSIS — R45851 Suicidal ideations: Secondary | ICD-10-CM | POA: Insufficient documentation

## 2015-09-20 LAB — I-STAT ARTERIAL BLOOD GAS, ED
Acid-Base Excess: 7 mmol/L — ABNORMAL HIGH (ref 0.0–2.0)
BICARBONATE: 30.1 meq/L — AB (ref 20.0–24.0)
O2 Saturation: 91 %
PCO2 ART: 35.2 mmHg (ref 35.0–45.0)
PH ART: 7.54 — AB (ref 7.350–7.450)
PO2 ART: 52 mmHg — AB (ref 80.0–100.0)
Patient temperature: 98
TCO2: 31 mmol/L (ref 0–100)

## 2015-09-20 LAB — I-STAT VENOUS BLOOD GAS, ED
Acid-Base Excess: 8 mmol/L — ABNORMAL HIGH (ref 0.0–2.0)
BICARBONATE: 31.7 meq/L — AB (ref 20.0–24.0)
O2 Saturation: 39 %
PCO2 VEN: 37.8 mmHg — AB (ref 45.0–50.0)
PH VEN: 7.532 — AB (ref 7.250–7.300)
TCO2: 33 mmol/L (ref 0–100)
pO2, Ven: 20 mmHg — CL (ref 30.0–45.0)

## 2015-09-20 LAB — I-STAT TROPONIN, ED: Troponin i, poc: 0.06 ng/mL (ref 0.00–0.08)

## 2015-09-20 LAB — CBC WITH DIFFERENTIAL/PLATELET
BASOS PCT: 0 %
Basophils Absolute: 0 10*3/uL (ref 0.0–0.1)
EOS PCT: 0 %
Eosinophils Absolute: 0 10*3/uL (ref 0.0–0.7)
HEMATOCRIT: 27.2 % — AB (ref 39.0–52.0)
HEMOGLOBIN: 9.1 g/dL — AB (ref 13.0–17.0)
Lymphocytes Relative: 19 %
Lymphs Abs: 1.4 10*3/uL (ref 0.7–4.0)
MCH: 32.9 pg (ref 26.0–34.0)
MCHC: 33.5 g/dL (ref 30.0–36.0)
MCV: 98.2 fL (ref 78.0–100.0)
MONO ABS: 4.1 10*3/uL — AB (ref 0.1–1.0)
MONOS PCT: 54 %
NEUTROS PCT: 27 %
Neutro Abs: 2.1 10*3/uL (ref 1.7–7.7)
Platelets: 193 10*3/uL (ref 150–400)
RBC: 2.77 MIL/uL — AB (ref 4.22–5.81)
RDW: 20.2 % — AB (ref 11.5–15.5)
WBC: 7.6 10*3/uL (ref 4.0–10.5)

## 2015-09-20 LAB — BASIC METABOLIC PANEL
Anion gap: 9 (ref 5–15)
BUN: 12 mg/dL (ref 6–20)
CHLORIDE: 92 mmol/L — AB (ref 101–111)
CO2: 29 mmol/L (ref 22–32)
CREATININE: 1.16 mg/dL (ref 0.61–1.24)
Calcium: 8.3 mg/dL — ABNORMAL LOW (ref 8.9–10.3)
GFR calc non Af Amer: >60 mL/min (ref >60)
Glucose, Bld: 354 mg/dL — ABNORMAL HIGH (ref 65–99)
POTASSIUM: 4.1 mmol/L (ref 3.5–5.1)
Sodium: 130 mmol/L — ABNORMAL LOW (ref 135–145)

## 2015-09-20 LAB — BRAIN NATRIURETIC PEPTIDE: B NATRIURETIC PEPTIDE 5: 1284 pg/mL — AB (ref 0.0–100.0)

## 2015-09-20 MED ORDER — OXYCODONE-ACETAMINOPHEN 5-325 MG PO TABS
1.0000 | ORAL_TABLET | Freq: Once | ORAL | Status: AC
  Administered 2015-09-20: 1 via ORAL
  Filled 2015-09-20: qty 1

## 2015-09-20 MED ORDER — FUROSEMIDE 10 MG/ML IJ SOLN
60.0000 mg | Freq: Once | INTRAMUSCULAR | Status: AC
  Administered 2015-09-20: 60 mg via INTRAVENOUS
  Filled 2015-09-20: qty 6

## 2015-09-20 MED ORDER — FUROSEMIDE 10 MG/ML IJ SOLN
40.0000 mg | Freq: Once | INTRAMUSCULAR | Status: AC
  Administered 2015-09-20: 40 mg via INTRAVENOUS
  Filled 2015-09-20: qty 4

## 2015-09-20 NOTE — Progress Notes (Signed)
abg results given to MD at this time. Metabolic Alkalosis Gas

## 2015-09-20 NOTE — ED Notes (Signed)
Staffing notified of need for sitter.  

## 2015-09-20 NOTE — Progress Notes (Signed)
abg collected  

## 2015-09-20 NOTE — H&P (Signed)
Family Medicine [facility] Admission History and Physical Service Pager: [phone]  Patient name: Russell Hamilton Medical record number: [id number] Date of birth: 1940/12/31 Age: 74 y.o. Gender: male  Primary Care Provider: Ambrose Finland, NP Consultants: None Code Status: Full  Chief Complaint: Shortness of breath and cough  Assessment and Plan: Russell Hamilton is a 74 y.o. male presenting with cough and shortness of breath. 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  1. Shortness of breath: Likely due to CHF exacerbation. Last ECHO 07/2015: EF 10-15%, diffuse hypokinesis, moderate LV dilation, moderately impaired RV function. BNP 1284 (runs around 1500-1800). Pt states he has not been taking his Lasix. ACS ruled out with EKG showing sinus tachycardia, old anteroseptal infarct, and ischemia in the lateral leads, unchanged from previous EKG. Troponin was 0.08, but Pt has had elevated troponins (0/05-0.19) at previous hospitalizations. May be secondary to heart strain. Pneumonia less likely, as CXR showed cardiomegaly with slight worsening of bilateral edema/infiltrates, probably layering pleural effusions. Another concern is PE, as Pt has been tachycardic in the low 100s. He had a CTA chest at his last admission earlier in the week that was negative for PE. On exam, Pt has 1+ pitting edema and crackles in the lung bases bilaterally. Weight is 164, up from 150lbs earlier in the year (157lb at last admission). Requiring 2L O2 by nasal cannula. - Admit to FPTS, attending Dr. Pollie Meyer  - Diurese with Lasix  IV bid - Heart failure team consult - Will get an ECHO - Trend troponins: 0.08 >  - Continue home med: Carvedilol - If he does not improve with diuresis, will consider getting a d-dimer to rule out PE. - SW consult for help with medication non-compliance - Case management consult for  medication assistance, as Pt states he cannot afford his medications. - Daily weights, strict I/O's - Cardiac monitoring - PT/OT consult - BMET in the am  2. Suicidal ideations: Pt states he wants to shoot himself in the head with a gun. He denies having these symptoms previously.  - Suicide precautions, sitter  - Will consult Psychiatry in the am.  3. COPD: No wheezes or rhonchi on exam. No increased sputum. Currently requiring 2L O2 by nasal cannula. - Continue home meds: Albuterol nebulizer and Symbicort - consider restarting prednisone or antibiotics if no improvement with diuresis   4. T2DM: Last HgbA1c (07/13/15): 8.2%. Glucose was 354 in the ED. - Sensitive SSI  5. CKD III: Cr 1.16 on admission. Baseline 1-1.2. - Monitor with daily BMETs - Avoid nephrotoxic agents  6. HTN: BPs have been well-controlled in the ED, ranging from 125-140/72-99.  - Continue home meds: Lisinopril 2.5mg  qd  7. Normocytic anemia: Likely anemia of chronic disease, although ferritin in 02/2015 was low-normal (32). Hgb 9.1 on admission. - Continue home med: ferrous sulfate  bid - Will continue to monitor   8. Lewy Body Dementia: At this point, Pt appears competent to make his own health care decisions. - Will continue to monitor.  FEN/GI: Heart healthy diet, SLIV Prophylaxis: Heparin sq  Disposition: Admit to FPTS, attending Dr. Pollie Meyer. Anticipate discharge home in 2-3 days.  History of Present Illness:  Russell Hamilton is a 74 y.o. male presenting with shortness of breath and cough. He was hospitalized from 11/9-11/11 for CHF vs COPD exacerbation, but left AMA. He states he went home and his shortness of breath  got worse. He didn't take any of his medications since he was last in the hospital. He has had coughing and SOB for three weeks. His shortness of breath improved during his last hospitalization. He says he doesn't have anyone to help him at home and he lives by himself. Today, he says he wanted  to shoot himself. He was going to get a pistol and shoot himself in the head. He does not own a gun. His family made him come back to the ED. He has never had thoughts like this before. Nothing has happened recently or has been troubling him recently. He denies any chest pain or dysuria. He endorses shortness of breath, cough, orthopnea, and pain in his feet. He states he cannot afford his medications.  Of note, he was just hospitalized from 11/9-11/11 for CHF exacerbation. His weight was up 7lbs from baseline and CTA chest showed bilateral pleural effusions and pulmonary edema. Heart failure team was consulted and he was treated with IV Lasix. However, he refused lab draws, so he was transitioned to his home PO Lasix dose of 60mg  bid because we were unable to monitor his kidney function and electrolytes. He also refused an ECHO and treatment of his diabetes with insulin.   In the ED, ABG was significant for pH 7.54, pCO2 352, pO2 52. BMET was significant for Na 130, Cr 1.16. BNP was 1284. Troponin was 0.06 > 0.08. He was admitted for further management of his CHF exacerbation.    Review Of Systems: Per HPI with the following additions: None  Otherwise the remainder of the systems were negative.  Patient Active Problem List   Diagnosis Date Noted  . Acute renal insufficiency 09/16/2015  . Renal insufficiency   . Sprain of left knee   . Fall at home   . Hypoxia   . Normochromic normocytic anemia   . Prolonged QT interval   . COPD exacerbation (HCC) 09/14/2015  . Chronic pain syndrome 09/08/2015  . Neutropenia (HCC) 07/13/2015  . CHF (congestive heart failure) (HCC) 07/13/2015  . Acute on chronic combined systolic and diastolic congestive heart failure (HCC) 06/12/2015  . Orthostatic hypotension 05/09/2015  . Tinea pedis 04/26/2015  . Benign paroxysmal positional vertigo 04/12/2015  . GERD (gastroesophageal reflux disease) 04/05/2015  . Dementia with Lewy bodies 04/05/2015  . COPD (chronic  obstructive pulmonary disease) (HCC) 03/26/2015  . COPD with acute exacerbation (HCC) 03/26/2015  . Chronic kidney disease, stage 3 03/01/2015  . Type 2 diabetes mellitus with diabetic chronic kidney disease (HCC)   . Unstable angina (HCC) 02/25/2015  . Congestive heart failure (HCC) 02/24/2015  . Cough, persistent 02/08/2015  . Tobacco abuse 02/08/2015  . Acute diastolic CHF (congestive heart failure) (HCC) 12/12/2014  . Acute systolic CHF (congestive heart failure) (HCC) 12/12/2014  . Acute CHF (HCC) 12/11/2014  . AKI (acute kidney injury) (HCC) 11/22/2014  . Hypokalemia 11/21/2014  . Elevated troponin 11/20/2014  . Acute on chronic combined systolic and diastolic heart failure (HCC) 11/20/2014  . Foot ulcer (HCC) 11/20/2014  . NSTEMI (non-ST elevated myocardial infarction) (HCC)   . Pleural effusion   . Acute respiratory failure with hypoxia (HCC)   . Protein-calorie malnutrition, severe (HCC) 08/13/2014  . Chronic systolic CHF (congestive heart failure) (HCC)   . Type II diabetes mellitus with renal manifestations, uncontrolled (HCC)   . Hypertension   . Stroke (HCC)   . Acute systolic CHF (congestive heart failure), NYHA class 4 (HCC) 08/12/2014  . History of stroke 08/12/2014  .  HTN (hypertension) 08/12/2014  . Peripheral neuropathy (HCC) 08/12/2014    Past Medical History: Past Medical History  Diagnosis Date  . Hypertension   . Chronic systolic CHF (congestive heart failure) (HCC)     a. 2D ECHO 08/13/14: EF 40%, diffuse hypokinesis possibly worse in the inferior wall. Mild MR, mild LA dilation, PA pressure 50. Trivial pericardial effusion.  . Acute systolic CHF (congestive heart failure), NYHA class 4 (HCC) 08/12/2014  . Type II diabetes mellitus (HCC)   . GERD (gastroesophageal reflux disease)   . Peripheral neuropathy (HCC)     [name]/notes 08/12/2014  . Stroke [facility](HCC) 2010    "memory problems since; right leg and left arm weakness since" (02/08/2015)  . Ulcers of both lower  extremities (HCC)     [name]/notes 02/24/2015  . Noncompliance     [name]/notes 02/24/2015  . COPD (chronic obstructive pulmonary disease) (HCC)   . Asthma   . Renal insufficiency     Past Surgical History: Past Surgical History  Procedure Laterality Date  . Carotid endarterectomy Right 2010  . Hand ligament reconstruction Right ?1970's    "pinky"  . Foot surgery Left     debridement    2016    Social History: Social History  Substance Use Topics  . Smoking status: Current Every Day Smoker -- 0.50 packs/day for 56 years    Types: Cigarettes  . Smokeless tobacco: Never Used  . Alcohol Use: No     Comment: 02/08/2015 "I might have a beer q 6 months"   Additional social history: None Please also refer to relevant sections of EMR.  Family History: Family History  Problem Relation Age of Onset  . Kidney disease Mother   . Heart attack Brother     X 2  . Cancer Brother     Allergies and Medications: No Known Allergies No current facility-administered medications on file prior to encounter.   Current Outpatient Prescriptions on File Prior to Encounter  Medication Sig Dispense Refill  . acetaminophen-codeine (TYLENOL #3) 300-30 MG tablet Take 1 tablet by mouth every 8 (eight) hours as needed for moderate pain. 40 tablet 1  . albuterol (PROVENTIL HFA;VENTOLIN HFA) 108 (90 BASE) MCG/ACT inhaler Inhale 2 puffs into the lungs every 6 (six) hours as needed for wheezing or shortness of breath. 1 Inhaler 1  . carvedilol (COREG) 3.125 MG tablet Take 1 tablet (3.125 mg total) by mouth 2 (two) times daily with a meal. 60 tablet 2  . ferrous sulfate 325 (65 FE) MG tablet Take 1 tablet (325 mg total) by mouth 2 (two) times daily with a meal. 60 tablet 1  . Fluticasone Furoate-Vilanterol (BREO ELLIPTA) 100-25 MCG/INH AEPB Inhale 1 puff into the lungs daily. 1 each 2  . furosemide (LASIX) 40 MG tablet Take 1.5 tablets (60 mg total) by mouth 2 (two) times daily. 90 tablet 1  . gabapentin (NEURONTIN) 300  MG capsule Take 600 mg by mouth 2 (two) times daily.    Marland Kitchen. omeprazole (PRILOSEC) 20 MG capsule Take 1 capsule (20 mg total) by mouth daily. 30 capsule 2  . oxyCODONE-acetaminophen (ROXICET) 5-325 MG tablet Take 1 tablet by mouth every 12 (twelve) hours as needed for severe pain. 60 tablet 0  . aspirin 81 MG chewable tablet Chew 1 tablet (81 mg total) by mouth daily. (Patient not taking: Reported on 09/20/2015) 30 tablet 3  . glimepiride (AMARYL) 2 MG tablet Take 1 tablet (2 mg total) by mouth daily with breakfast. (Patient not taking:  Reported on 09/14/2015) 30 tablet 2  . ipratropium (ATROVENT) 0.02 % nebulizer solution Take 2.5 mLs (0.5 mg total) by nebulization once. (Patient not taking: Reported on 09/14/2015) 2.5 mL 0  . isosorbide mononitrate (IMDUR) 30 MG 24 hr tablet Take 1 tablet (30 mg total) by mouth daily. (Patient not taking: Reported on 08/17/2015) 30 tablet 2  . lisinopril (PRINIVIL,ZESTRIL) 2.5 MG tablet Take 1 tablet (2.5 mg total) by mouth daily. (Patient not taking: Reported on 08/17/2015) 30 tablet 2  . pregabalin (LYRICA) 100 MG capsule Take 1 capsule (100 mg total) by mouth 2 (two) times daily. 60 capsule 1  . silver sulfADIAZINE (SILVADENE) 1 % cream Apply 1 application topically daily. (Patient not taking: Reported on 09/14/2015) 50 g 0  . sitaGLIPtin (JANUVIA) 50 MG tablet Take 1 tablet (50 mg total) by mouth daily. For diabetes (Patient not taking: Reported on 09/14/2015) 30 tablet 2    Objective: BP 138/100 mmHg  Pulse 70  Temp(Src) 98 F (36.7 C) (Oral)  Resp 25  Ht  (1.727 m)  Wt 166 lb (75.297 kg)  BMI 25.25 kg/m2  SpO2 100% Exam: General: Patient lying in bed, [place] in place, moaning and coughing throughout exam. Eyes: No scleral icterus, EOMI ENTM: moist mucous membranes  Neck: Supple  Cardiovascular: Tachycardic, no murmurs, rubs, gallops Respiratory: Crackles in the lung bases bilaterally, no wheezes or rhonchi Abdomen: BS+, non-tender,  non-distended MSK: Pt is exquisitely tender to palpation of his ankles/feet Skin: Feet appear dry, but no lesions or rashes Psych: Cognition and judgment appear intact.  Labs and Imaging: CBC BMET   Recent Labs Lab 09/20/15 2110  WBC 7.6  HGB 9.1*  HCT 27.2*  PLT 193    Recent Labs Lab 09/20/15 2110  NA 130*  K 4.1  CL 92*  CO2 29  BUN 12  CREATININE 1.16  GLUCOSE 354*  CALCIUM 8.3*     Troponin: 0.08 BNP: 1284 ABG: pH 7.54, pCO2 35.2, pO2 52  CXR: Cardiomegaly with slight worsening of bilateral edema/infiltrates, persistent laying effusions.  Campbell Stall, MD 09/20/2015, 10:27 PM PGY-1, [facility] FPTS Intern pager: [phone], text pages welcome  Upper Level Addendum:  I have seen and evaluated this patient along with Dr. Nancy Marus and reviewed the above note, making necessary revisions in [facility].   Clare Gandy, MD Family Medicine PGY-3

## 2015-09-20 NOTE — ED Notes (Signed)
Pt states shortness of breath and productive cough for 2 days. Denies any chest pain. Per ems family called because they were concerned about pt because since Sunday he hasnt been taking any medicines of eating. Pt states he just didn't feel like taking his medicines. Pt also reports having thought of harming himself that started today. Pt states he would shoot himself, deneis having a gun in his home. Pt states he is just tired and doesn't feel like doing anything or eating. hasn't spelt in two nights.

## 2015-09-20 NOTE — ED Provider Notes (Signed)
CSN: [id number]     Arrival date & time 09/20/15  2001 History   First MD Initiated Contact with Patient 09/20/15 2014     Chief Complaint  Patient presents with  . Shortness of Breath     (Consider location/radiation/quality/duration/timing/severity/associated sxs/prior Treatment) Patient is a 74 y.o. male presenting with shortness of breath. The history is provided by the patient and medical records. The history is limited by the condition of the patient.  Shortness of Breath Severity:  Moderate Onset quality:  Gradual Duration:  4 days Timing:  Intermittent Progression:  Waxing and waning Chronicity:  Recurrent Context: URI   Relieved by:  None tried Worsened by:  Nothing tried Ineffective treatments:  None tried Associated symptoms: chest pain, cough and wheezing   Associated symptoms: no abdominal pain, no diaphoresis, no fever, no headaches, no hemoptysis, no neck pain, no PND, no rash, no sore throat, no sputum production and no syncope   Risk factors: alcohol use and tobacco use     Past Medical History  Diagnosis Date  . Hypertension   . Chronic systolic CHF (congestive heart failure) (HCC)     a. 2D ECHO 08/13/14: EF 40%, diffuse hypokinesis possibly worse in the inferior wall. Mild MR, mild LA dilation, PA pressure 50. Trivial pericardial effusion.  . Acute systolic CHF (congestive heart failure), NYHA class 4 (HCC) 08/12/2014  . Type II diabetes mellitus (HCC)   . GERD (gastroesophageal reflux disease)   . Peripheral neuropathy (HCC)     [name] 08/12/2014  . Stroke [facility]) 2010    "memory problems since; right leg and left arm weakness since" (02/08/2015)  . Ulcers of both lower extremities (HCC)     [name] 02/24/2015  . Noncompliance     [name] 02/24/2015  . COPD (chronic obstructive pulmonary disease) (HCC)   . Asthma   . Renal insufficiency    Past Surgical History  Procedure Laterality Date  . Carotid endarterectomy Right 2010  . Hand ligament reconstruction  Right ?1970's    "pinky"  . Foot surgery Left     debridement    2016   Family History  Problem Relation Age of Onset  . Kidney disease Mother   . Heart attack Brother     X 2  . Cancer Brother    Social History  Substance Use Topics  . Smoking status: Current Every Day Smoker -- 0.50 packs/day for 56 years    Types: Cigarettes  . Smokeless tobacco: Never Used  . Alcohol Use: No     Comment: 02/08/2015 "I might have a beer q 6 months"    Review of Systems  Constitutional: Negative for fever and diaphoresis.  HENT: Negative for facial swelling and sore throat.   Respiratory: Positive for cough, shortness of breath and wheezing. Negative for hemoptysis and sputum production.   Cardiovascular: Positive for chest pain. Negative for syncope and PND.  Gastrointestinal: Negative for abdominal pain.  Genitourinary: Negative for dysuria.  Musculoskeletal: Positive for arthralgias. Negative for back pain and neck pain.  Skin: Negative for rash.  Neurological: Negative for headaches.  Psychiatric/Behavioral: Negative for confusion.      Allergies  Review of patient's allergies indicates no known allergies.  Home Medications   Prior to Admission medications   Medication Sig Start Date End Date Taking? Authorizing Provider  acetaminophen-codeine (TYLENOL #3) 300-30 MG tablet Take 1 tablet by mouth every 8 (eight) hours as needed for moderate pain. 08/17/15  Yes Jaclyn Shaggy, MD  albuterol (PROVENTIL  HFA;VENTOLIN HFA) 108 (90 BASE) MCG/ACT inhaler Inhale 2 puffs into the lungs every 6 (six) hours as needed for wheezing or shortness of breath. 08/17/15  Yes Jaclyn Shaggy, MD  carvedilol (COREG) 3.125 MG tablet Take 1 tablet (3.125 mg total) by mouth 2 (two) times daily with a meal. 08/17/15  Yes Gaylord Shih, MD  ferrous sulfate 325 (65 FE) MG tablet Take 1 tablet (325 mg total) by mouth 2 (two) times daily with a meal. 07/04/15  Yes Jaclyn Shaggy, MD  Fluticasone Furoate-Vilanterol (BREO  ELLIPTA) 100-25 MCG/INH AEPB Inhale 1 puff into the lungs daily. 09/07/15  Yes Jaclyn Shaggy, MD  furosemide (LASIX) 40 MG tablet Take 1.5 tablets (60 mg total) by mouth 2 (two) times daily. 09/07/15  Yes Jaclyn Shaggy, MD  gabapentin (NEURONTIN) 300 MG capsule Take 600 mg by mouth 2 (two) times daily.   Yes Historical Provider, MD  omeprazole (PRILOSEC) 20 MG capsule Take 1 capsule (20 mg total) by mouth daily. 09/12/15  Yes Jaclyn Shaggy, MD  oxyCODONE-acetaminophen (ROXICET) 5-325 MG tablet Take 1 tablet by mouth every 12 (twelve) hours as needed for severe pain. 09/07/15  Yes Jaclyn Shaggy, MD  aspirin 81 MG chewable tablet Chew 1 tablet (81 mg total) by mouth daily. Patient not taking: Reported on 09/20/2015 06/20/15   Jaclyn Shaggy, MD  glimepiride (AMARYL) 2 MG tablet Take 1 tablet (2 mg total) by mouth daily with breakfast. Patient not taking: Reported on 09/14/2015 07/04/15   Jaclyn Shaggy, MD  ipratropium (ATROVENT) 0.02 % nebulizer solution Take 2.5 mLs (0.5 mg total) by nebulization once. Patient not taking: Reported on 09/14/2015 09/07/15   Jaclyn Shaggy, MD  isosorbide mononitrate (IMDUR) 30 MG 24 hr tablet Take 1 tablet (30 mg total) by mouth daily. Patient not taking: Reported on 08/17/2015 04/19/15   Jaclyn Shaggy, MD  lisinopril (PRINIVIL,ZESTRIL) 2.5 MG tablet Take 1 tablet (2.5 mg total) by mouth daily. Patient not taking: Reported on 08/17/2015 07/04/15   Jaclyn Shaggy, MD  pregabalin (LYRICA) 100 MG capsule Take 1 capsule (100 mg total) by mouth 2 (two) times daily. 09/07/15   Jaclyn Shaggy, MD  silver sulfADIAZINE (SILVADENE) 1 % cream Apply 1 application topically daily. Patient not taking: Reported on 09/14/2015 06/20/15   Vivi Barrack, DPM  sitaGLIPtin (JANUVIA) 50 MG tablet Take 1 tablet (50 mg total) by mouth daily. For diabetes Patient not taking: Reported on 09/14/2015 07/04/15   Jaclyn Shaggy, MD   BP 140/99 mmHg  Pulse 113  Temp(Src) 98 F (36.7 C) (Oral)  Resp 18  Ht 5\' 8"  (1.727  m)  Wt 166 lb (75.297 kg)  BMI 25.25 kg/m2  SpO2 98% Physical Exam  Constitutional: He is oriented to person, place, and time. He appears well-developed and well-nourished. No distress.  HENT:  Head: Normocephalic and atraumatic.  Right Ear: External ear normal.  Left Ear: External ear normal.  Nose: Nose normal.  Mouth/Throat: Oropharynx is clear and moist. No oropharyngeal exudate.  Eyes: Conjunctivae and EOM are normal. Pupils are equal, round, and reactive to light. Right eye exhibits no discharge. Left eye exhibits no discharge. No scleral icterus.  Neck: Normal range of motion. Neck supple. No JVD present. No tracheal deviation present. No thyromegaly present.  Cardiovascular: Normal rate, regular rhythm and intact distal pulses.   Pulmonary/Chest: Effort normal. No stridor. No respiratory distress. He has wheezes. He has rales. He exhibits no tenderness.  Abdominal: Soft. He exhibits no distension. There is no tenderness.  Musculoskeletal:  Normal range of motion. He exhibits no edema or tenderness.  Lymphadenopathy:    He has no cervical adenopathy.  Neurological: He is alert and oriented to person, place, and time. He displays normal reflexes. No cranial nerve deficit. Coordination normal.  Skin: Skin is warm and dry. No rash noted. He is not diaphoretic. No erythema. No pallor.  Psychiatric: He has a normal mood and affect. His behavior is normal. Judgment and thought content normal.  Nursing note and vitals reviewed.   ED Course  Procedures (including critical care time) Labs Review Labs Reviewed  BASIC METABOLIC PANEL - Abnormal; Notable for the following:    Sodium 130 (*)    Chloride 92 (*)    Glucose, Bld 354 (*)    Calcium 8.3 (*)    All other components within normal limits  CBC WITH DIFFERENTIAL/PLATELET - Abnormal; Notable for the following:    RBC 2.77 (*)    Hemoglobin 9.1 (*)    HCT 27.2 (*)    RDW 20.2 (*)    Monocytes Absolute 4.1 (*)    All other  components within normal limits  BRAIN NATRIURETIC PEPTIDE - Abnormal; Notable for the following:    B Natriuretic Peptide 1284.0 (*)    All other components within normal limits  BASIC METABOLIC PANEL - Abnormal; Notable for the following:    Sodium 131 (*)    Chloride 90 (*)    CO2 33 (*)    Glucose, Bld 315 (*)    Creatinine, Ser 1.29 (*)    Calcium 8.4 (*)    GFR calc non Af Amer 54 (*)    All other components within normal limits  TROPONIN I - Abnormal; Notable for the following:    Troponin I 0.08 (*)    All other components within normal limits  TROPONIN I - Abnormal; Notable for the following:    Troponin I 0.11 (*)    All other components within normal limits  TROPONIN I - Abnormal; Notable for the following:    Troponin I 0.12 (*)    All other components within normal limits  GLUCOSE, CAPILLARY - Abnormal; Notable for the following:    Glucose-Capillary 306 (*)    All other components within normal limits  TROPONIN I - Abnormal; Notable for the following:    Troponin I 0.12 (*)    All other components within normal limits  GLUCOSE, CAPILLARY - Abnormal; Notable for the following:    Glucose-Capillary 202 (*)    All other components within normal limits  I-STAT ARTERIAL BLOOD GAS, ED - Abnormal; Notable for the following:    pH, Arterial 7.540 (*)    pO2, Arterial 52.0 (*)    Bicarbonate 30.1 (*)    Acid-Base Excess 7.0 (*)    All other components within normal limits  I-STAT VENOUS BLOOD GAS, ED - Abnormal; Notable for the following:    pH, Ven 7.532 (*)    pCO2, Ven 37.8 (*)    pO2, Ven 20.0 (*)    Bicarbonate 31.7 (*)    Acid-Base Excess 8.0 (*)    All other components within normal limits  BASIC METABOLIC PANEL  I-STAT TROPOININ, ED    Imaging Review Dg Chest Portable 1 View  09/20/2015  CLINICAL DATA:  SOB. Pt states shortness of breath and productive cough for 2 days. Denies any chest pain. Per ems family called because they were concerned about pt  because since Sunday he hasnt been taking any medicines of eating.  Pt states he.*comment was truncated* EXAM: PORTABLE CHEST - 1 VIEW COMPARISON:  09/14/2015 and previous FINDINGS: Stable moderate cardiomegaly. Slight worsening of perihilar and bibasilar interstitial and airspace edema or infiltrates. Probable layering pleural effusions as before. Central pulmonary vascular congestion. No pneumothorax. Minimal spurring in the mid thoracic spine. IMPRESSION: 1. Cardiomegaly with slight worsening of bilateral edema/infiltrates, persistent layering effusions. Electronically Signed   By: Corlis Leak M.D.   On: 09/20/2015 21:13   I have personally reviewed and evaluated these images and lab results as part of my medical decision-making.   EKG Interpretation   Date/Time:  Tuesday September 20 2015 20:09:40 EST Ventricular Rate:  110 PR Interval:  151 QRS Duration: 106 QT Interval:  330 QTC Calculation: 446 R Axis:   16 Text Interpretation:  Sinus tachycardia Atrial premature complexes  Probable left atrial enlargement Low voltage, extremity leads Anteroseptal  infarct, old Repol abnrm suggests ischemia, lateral leads No significant  change since last tracing Confirmed by FLOYD MD, Reuel Boom [phone]) on  09/20/2015 9:10:40 PM      MDM   Final diagnoses:  Acute on chronic congestive heart failure, unspecified congestive heart failure type (HCC)    Denzil Mceachron is a 74 y.o. male patient who previously left AMA from BTx and other concerns which led to admission.  Pt also states that he is suicidal and has concerns about his final disposition.  Nothing other than same symptoms as before.     CXR, troponin, labs.  Pt will need to evaluate further.  SOB improving sedispute of  CXR shows signs of fluid .   Pt discussed with medicine for admission.  Patient care was discussed with my attending, Dr. Adela Lank.                              Gavin Pound, MD 09/22/15  [id number]  Melene Plan, DO 09/22/15 [phone]

## 2015-09-21 ENCOUNTER — Inpatient Hospital Stay (HOSPITAL_COMMUNITY): Payer: Medicare Other

## 2015-09-21 ENCOUNTER — Encounter (HOSPITAL_COMMUNITY): Payer: Self-pay | Admitting: General Practice

## 2015-09-21 DIAGNOSIS — E1310 Other specified diabetes mellitus with ketoacidosis without coma: Secondary | ICD-10-CM

## 2015-09-21 DIAGNOSIS — I159 Secondary hypertension, unspecified: Secondary | ICD-10-CM

## 2015-09-21 DIAGNOSIS — Z9119 Patient's noncompliance with other medical treatment and regimen: Secondary | ICD-10-CM

## 2015-09-21 DIAGNOSIS — I509 Heart failure, unspecified: Secondary | ICD-10-CM

## 2015-09-21 DIAGNOSIS — G6289 Other specified polyneuropathies: Secondary | ICD-10-CM

## 2015-09-21 DIAGNOSIS — Z91199 Patient's noncompliance with other medical treatment and regimen due to unspecified reason: Secondary | ICD-10-CM

## 2015-09-21 DIAGNOSIS — J42 Unspecified chronic bronchitis: Secondary | ICD-10-CM

## 2015-09-21 DIAGNOSIS — R45851 Suicidal ideations: Secondary | ICD-10-CM | POA: Insufficient documentation

## 2015-09-21 LAB — TROPONIN I
TROPONIN I: 0.11 ng/mL — AB (ref <0.031)
TROPONIN I: 0.12 ng/mL — AB (ref <0.031)
TROPONIN I: 0.12 ng/mL — AB (ref <0.031)
Troponin I: 0.08 ng/mL — ABNORMAL HIGH (ref <0.031)

## 2015-09-21 LAB — BASIC METABOLIC PANEL
ANION GAP: 8 (ref 5–15)
BUN: 10 mg/dL (ref 6–20)
CALCIUM: 8.4 mg/dL — AB (ref 8.9–10.3)
CHLORIDE: 90 mmol/L — AB (ref 101–111)
CO2: 33 mmol/L — AB (ref 22–32)
Creatinine, Ser: 1.29 mg/dL — ABNORMAL HIGH (ref 0.61–1.24)
GFR calc Af Amer: >60 mL/min (ref >60)
GFR, EST NON AFRICAN AMERICAN: 54 mL/min — AB (ref >60)
GLUCOSE: 315 mg/dL — AB (ref 65–99)
POTASSIUM: 3.9 mmol/L (ref 3.5–5.1)
Sodium: 131 mmol/L — ABNORMAL LOW (ref 135–145)

## 2015-09-21 LAB — GLUCOSE, CAPILLARY
Glucose-Capillary: 306 mg/dL — ABNORMAL HIGH (ref 65–99)
Glucose-Capillary: 202 mg/dL — ABNORMAL HIGH (ref 65–99)

## 2015-09-21 MED ORDER — HEPARIN SODIUM (PORCINE) 5000 UNIT/ML IJ SOLN
5000.0000 [IU] | Freq: Three times a day (TID) | INTRAMUSCULAR | Status: DC
  Filled 2015-09-21 (×5): qty 1

## 2015-09-21 MED ORDER — FERROUS SULFATE 325 (65 FE) MG PO TABS
325.0000 mg | ORAL_TABLET | Freq: Two times a day (BID) | ORAL | Status: DC
Start: 2015-09-21 — End: 2015-09-21

## 2015-09-21 MED ORDER — LISINOPRIL 2.5 MG PO TABS
2.5000 mg | ORAL_TABLET | Freq: Every day | ORAL | Status: DC
  Administered 2015-09-21 – 2015-09-22 (×2): 2.5 mg via ORAL
  Filled 2015-09-21 (×3): qty 1

## 2015-09-21 MED ORDER — PREGABALIN 100 MG PO CAPS
100.0000 mg | ORAL_CAPSULE | Freq: Three times a day (TID) | ORAL | Status: DC
  Administered 2015-09-21 – 2015-09-27 (×20): 100 mg via ORAL
  Filled 2015-09-21 (×20): qty 1

## 2015-09-21 MED ORDER — CARVEDILOL 3.125 MG PO TABS: 3.1250 mg | ORAL_TABLET | Freq: Two times a day (BID) | ORAL | Status: DC

## 2015-09-21 MED ORDER — FERROUS SULFATE 325 (65 FE) MG PO TABS
325.0000 mg | ORAL_TABLET | Freq: Two times a day (BID) | ORAL | Status: DC
  Administered 2015-09-21 – 2015-09-27 (×14): 325 mg via ORAL
  Filled 2015-09-21 (×14): qty 1

## 2015-09-21 MED ORDER — SODIUM CHLORIDE 0.9 % IV SOLN: 250.0000 mL | INTRAVENOUS | Status: DC | PRN

## 2015-09-21 MED ORDER — ACETAMINOPHEN 325 MG PO TABS
650.0000 mg | ORAL_TABLET | ORAL | Status: DC | PRN
  Administered 2015-09-24: 650 mg via ORAL
  Filled 2015-09-21 (×2): qty 2

## 2015-09-21 MED ORDER — ISOSORBIDE MONONITRATE ER 30 MG PO TB24
30.0000 mg | ORAL_TABLET | Freq: Every day | ORAL | Status: DC
  Administered 2015-09-21 – 2015-09-22 (×2): 30 mg via ORAL
  Filled 2015-09-21 (×3): qty 1

## 2015-09-21 MED ORDER — BUDESONIDE-FORMOTEROL FUMARATE 160-4.5 MCG/ACT IN AERO
2.0000 | INHALATION_SPRAY | Freq: Two times a day (BID) | RESPIRATORY_TRACT | Status: DC
  Administered 2015-09-21 – 2015-09-25 (×5): 2 via RESPIRATORY_TRACT
  Filled 2015-09-21: qty 6

## 2015-09-21 MED ORDER — ONDANSETRON HCL 4 MG/2ML IJ SOLN: 4.0000 mg | Freq: Four times a day (QID) | INTRAMUSCULAR | Status: DC | PRN

## 2015-09-21 MED ORDER — ALBUTEROL SULFATE (2.5 MG/3ML) 0.083% IN NEBU: 2.5000 mg | INHALATION_SOLUTION | Freq: Four times a day (QID) | RESPIRATORY_TRACT | Status: DC | PRN

## 2015-09-21 MED ORDER — PREGABALIN 100 MG PO CAPS
ORAL_CAPSULE | ORAL | Status: AC
  Filled 2015-09-21: qty 1

## 2015-09-21 MED ORDER — CARVEDILOL 3.125 MG PO TABS
3.1250 mg | ORAL_TABLET | Freq: Two times a day (BID) | ORAL | Status: DC
Start: 2015-09-21 — End: 2015-09-22
  Administered 2015-09-21 – 2015-09-22 (×2): 3.125 mg via ORAL
  Filled 2015-09-21 (×2): qty 1

## 2015-09-21 MED ORDER — SODIUM CHLORIDE 0.9 % IJ SOLN: 3.0000 mL | INTRAMUSCULAR | Status: DC | PRN

## 2015-09-21 MED ORDER — ASPIRIN 81 MG PO CHEW
81.0000 mg | CHEWABLE_TABLET | Freq: Every day | ORAL | Status: DC
  Administered 2015-09-21 – 2015-09-27 (×7): 81 mg via ORAL
  Filled 2015-09-21 (×7): qty 1

## 2015-09-21 MED ORDER — SODIUM CHLORIDE 0.9 % IJ SOLN
3.0000 mL | Freq: Two times a day (BID) | INTRAMUSCULAR | Status: DC
  Administered 2015-09-21 – 2015-09-27 (×14): 3 mL via INTRAVENOUS

## 2015-09-21 MED ORDER — IPRATROPIUM BROMIDE 0.02 % IN SOLN: 0.5000 mg | Freq: Once | RESPIRATORY_TRACT | Status: DC

## 2015-09-21 MED ORDER — OXYCODONE-ACETAMINOPHEN 5-325 MG PO TABS
1.0000 | ORAL_TABLET | Freq: Two times a day (BID) | ORAL | Status: DC | PRN
  Administered 2015-09-21 – 2015-09-27 (×4): 1 via ORAL
  Filled 2015-09-21 (×3): qty 1

## 2015-09-21 MED ORDER — INSULIN ASPART 100 UNIT/ML ~~LOC~~ SOLN
0.0000 [IU] | Freq: Three times a day (TID) | SUBCUTANEOUS | Status: DC
  Administered 2015-09-21: 7 [IU] via SUBCUTANEOUS
  Administered 2015-09-21 – 2015-09-22 (×3): 3 [IU] via SUBCUTANEOUS
  Administered 2015-09-22: 5 [IU] via SUBCUTANEOUS
  Administered 2015-09-23: 3 [IU] via SUBCUTANEOUS
  Administered 2015-09-23 (×2): 5 [IU] via SUBCUTANEOUS
  Administered 2015-09-24 (×3): 2 [IU] via SUBCUTANEOUS
  Administered 2015-09-25: 9 [IU] via SUBCUTANEOUS

## 2015-09-21 MED ORDER — FUROSEMIDE 10 MG/ML IJ SOLN
60.0000 mg | Freq: Two times a day (BID) | INTRAMUSCULAR | Status: DC
  Administered 2015-09-21 – 2015-09-23 (×5): 60 mg via INTRAVENOUS
  Filled 2015-09-21 (×5): qty 6

## 2015-09-21 MED ORDER — PREGABALIN 100 MG PO CAPS
100.0000 mg | ORAL_CAPSULE | Freq: Two times a day (BID) | ORAL | Status: DC
  Administered 2015-09-21: 100 mg via ORAL

## 2015-09-21 MED ORDER — OXYCODONE-ACETAMINOPHEN 5-325 MG PO TABS
ORAL_TABLET | ORAL | Status: AC
  Filled 2015-09-21: qty 1

## 2015-09-21 NOTE — Evaluation (Signed)
Physical Therapy Evaluation Patient Details Name: Russell Hamilton MRN: [id number] DOB: 10/14/1941 Today's Date: 09/21/2015   History of Present Illness  pt presents with CHF Exacerbation.  pt with hx of recent admit for the same and left AMA.  pt with hx of NSTEMI, Dementia, CKD, CVA, HTN, COPD, DM, CHF, Chronic Pain, Orhtostatic Hypotension, and BPPV.    Clinical Impression  Pt not very receptive to PT or suggestions of mobility.  Pt had been sitting comfortably until PT mentioned ambulation and then pt began to moan and call out in pain stating he needed to return to bed.  Feel safest D/C plan is for pt to D/C to SNF for further therapy and Nsg care, but doubt pt would be agreeable.  If pt returns to home, [facility].  Will continue to follow.      Follow Up Recommendations SNF    Equipment Recommendations  None recommended by PT    Recommendations for Other Services       Precautions / Restrictions Precautions Precautions: Fall Restrictions Weight Bearing Restrictions: No      Mobility  Bed Mobility Overal bed mobility: Needs Assistance Bed Mobility: Sit to Supine       Sit to supine: Supervision   General bed mobility comments: No physical A needed to return to supine.    Transfers                 General transfer comment: pt refused.  Ambulation/Gait                Stairs            Wheelchair Mobility    Modified Rankin (Stroke Patients Only)       Balance Overall balance assessment: History of Falls                                           Pertinent Vitals/Pain Pain Assessment: Faces Faces Pain Scale: Hurts even more Pain Location: pt indicates Bil feet once PT mentions ambulation.  pt had been sitting without any distress, then began to moan and call out in pain after PT suggested ambulation.   Pain Descriptors / Indicators: Moaning Pain Intervention(s): Monitored  during session;Repositioned;Patient requesting pain meds-RN notified    Home Living Family/patient expects to be discharged to:: Private residence Living Arrangements: Alone Available Help at Discharge: Family;Available PRN/intermittently Type of Home: House Home Access: Stairs to enter Entrance Stairs-Rails: Left Entrance Stairs-Number of Steps: 8 Home Layout: Two level;Able to live on main level with bedroom/bathroom Home Equipment: Gilmer Morane - single point      Prior Function Level of Independence: Independent with assistive device(s)               Hand Dominance   Dominant Hand: Right    Extremity/Trunk Assessment   Upper Extremity Assessment: Defer to OT evaluation           Lower Extremity Assessment: Overall WFL for tasks assessed (pt not participating in all assessment, but actively moving.)      Cervical / Trunk Assessment: Normal  Communication   Communication: No difficulties  Cognition Arousal/Alertness: Awake/alert Behavior During Therapy: WFL for tasks assessed/performed Overall Cognitive Status: No family/caregiver present to determine baseline cognitive functioning  General Comments      Exercises        Assessment/Plan    PT Assessment Patient needs continued PT services  PT Diagnosis Generalized weakness;Difficulty walking   PT Problem List Decreased strength;Decreased activity tolerance;Decreased balance;Decreased mobility;Decreased cognition;Decreased knowledge of use of DME;Pain;Decreased safety awareness  PT Treatment Interventions DME instruction;Gait training;Functional mobility training;Therapeutic activities;Therapeutic exercise;Balance training;Neuromuscular re-education;Cognitive remediation;Patient/family education;Stair training   PT Goals (Current goals can be found in the Care Plan section) Acute Rehab PT Goals Patient Stated Goal: Get out of here.   PT Goal Formulation: With patient Time For Goal  Achievement: 10/05/15 Potential to Achieve Goals: Good    Frequency Min 3X/week   Barriers to discharge Decreased caregiver support      Co-evaluation               End of Session Equipment Utilized During Treatment: Oxygen Activity Tolerance: Patient limited by pain Patient left: in bed;with call bell/phone within reach;with bed alarm set;with nursing/sitter in room Nurse Communication: Mobility status         Time: 4696-2952 PT Time Calculation (min) (ACUTE ONLY): 12 min   Charges:   PT Evaluation $Initial PT Evaluation Tier I: 1 Procedure     PT G CodesSunny Schlein, [place] [phone] 09/21/2015, 10:55 AM

## 2015-09-21 NOTE — Progress Notes (Signed)
Talked to patient about DCP. CM asked patient if he is having any problems getting his medication? Patient stated " That's nonsense."  CM asked patient if he wanted HHC services, patient stated "No." CM asked patient if there was anything I could do for him, he stated "No." CM signing off. Abelino DerrickB Markis Langland Bradford Regional Medical CenterRN,MHA,BSN [phone]

## 2015-09-21 NOTE — Progress Notes (Signed)
Pt. Arrived to the floor via stretcher with sitter from ED. Pt. Alert and stable, c/o leg pain. Room checked and hazards removed per protocol. Pt. Oriented to room. Sitter at bedside. RN will continue to monitor pt. For changes in condition. Jantzen Pilger, Cheryll DessertKaren Cherrell

## 2015-09-21 NOTE — Consult Note (Signed)
CARDIOLOGY CONSULT NOTE   Patient ID: Clayborne Divis MRN: [id number], DOB/AGE: 03-30-1941   Admit date: 09/20/2015 Date of Consult: 09/21/2015 Reason for  Consult: CHF Exacerbation   Primary Physician: Ambrose Finland, NP Primary Cardiologist: Dr. Delton See Dr. Daleen Squibb  HPI: Russell Hamilton is a 74 y.o. male with past medical history of chronic systolic CHF, Type 2 DM, GERD, CVA, COPD, HTN, Stage 3 CKD, Lewy Body Dementia, and medication noncompliance who presented to [facility] ED on 09/20/2015 for worsening shortness of breath and a productive cough for the past two days. Family also mentioned the patient had been expressing suicidal ideations prior to arrival.  He was recently admitted from 09/14/2015 to 09/16/2015 with similar symptoms, however he left AMA before medically stable for discharge. During that admission, he refused a repeat echocardiogram, labs, and some medications. He reports not taking any of his medications since leaving the hospital.  His weight was 157lbs at time of last admission on 09/14/2015, however had increased to 166lbs at discharge on 09/16/2015. His weight is down to 161lbs currently. He says his breathing is "the same". Also reports having lower extremity edema which is "always there". Denies any recent chest pain, palpitations, orthopnea, or PND. Does report having a productive cough.   While admitted, his BNP is at 1284, consistent with 1230 seven days prior. Cyclic troponin values have been 0.08 and 0.11, consistent with previous values. His creatinine is 1.29 on 09/21/2015 (baseline 1.1 -1.2). CXR showed cardiomegaly with worsening bilateral edema/infiltrates with persistent layering effusions.  He is currently on Lasix  IV BID. Net output of -3.3L thus far.  His EF has gradually declined since 2015, being 40% on echo in 08/2014, down to 30-35% on  02/27/2015, and his most recent echo on  07/13/2015 showing an EF 10-15%. When his EF was first noticed to be decreased in  08/2014, a heart catheterization was recommended but he refused.   Problem List Past Medical History  Diagnosis Date  . Hypertension   . Chronic systolic CHF (congestive heart failure) (HCC)     a. 2D ECHO 08/13/14: EF 40%, diffuse hypokinesis possibly worse in the inferior wall. Mild MR, mild LA dilation, PA pressure 50. Trivial pericardial effusion.  . Acute systolic CHF (congestive heart failure), NYHA class 4 (HCC) 08/12/2014  . Type II diabetes mellitus (HCC)   . GERD (gastroesophageal reflux disease)   . Peripheral neuropathy (HCC)     [name] 08/12/2014  . Stroke [facility]) 2010    "memory problems since; right leg and left arm weakness since" (02/08/2015)  . Ulcers of both lower extremities (HCC)     [name] 02/24/2015  . Noncompliance     [name] 02/24/2015  . COPD (chronic obstructive pulmonary disease) (HCC)   . Asthma   . Renal insufficiency     Past Surgical History  Procedure Laterality Date  . Carotid endarterectomy Right 2010  . Hand ligament reconstruction Right ?1970's    "pinky"  . Foot surgery Left     debridement    2016     Allergies No Known Allergies    Inpatient Medications  . aspirin  81 mg Oral Daily  . budesonide-formoterol  2 puff Inhalation BID  . ferrous sulfate  325 mg Oral BID WC  . furosemide  60 mg Intravenous BID  . heparin  5,000 Units Subcutaneous 3 times per day  . insulin aspart  0-9 Units Subcutaneous TID WC  . isosorbide mononitrate  30 mg Oral Daily  .  lisinopril  2.5 mg Oral Daily  . pregabalin  100 mg Oral TID  . sodium chloride  3 mL Intravenous Q12H    Family History Family History  Problem Relation Age of Onset  . Kidney disease Mother   . Heart attack Brother     X 2  . Cancer Brother      Social History Social History   Social History  . Marital Status: Married    Spouse Name: N/A  . Number of Children: N/A  . Years of Education: N/A   Occupational History  . Not on file.   Social History Main Topics  .  Smoking status: Current Every Day Smoker -- 0.50 packs/day for 56 years    Types: Cigarettes  . Smokeless tobacco: Never Used  . Alcohol Use: No     Comment: 02/08/2015 "I might have a beer q 6 months"  . Drug Use: Yes    Special: Marijuana     Comment: last marijuana use 08/16/15  . Sexual Activity: Not Currently   Other Topics Concern  . Not on file   Social History Narrative     Review of Systems General:  No chills, fever, night sweats or weight changes.  Cardiovascular:  No chest pain, orthopnea, palpitations, paroxysmal nocturnal dyspnea. Positive for dyspnea on exertion and edema. Dermatological: No rash, lesions/masses Respiratory: Positive for cough and dyspnea Urologic: No hematuria, dysuria Abdominal:   No nausea, vomiting, diarrhea, bright red blood per rectum, melena, or hematemesis Neurologic:  No visual changes, wkns, changes in mental status. All other systems reviewed and are otherwise negative except as noted above.  Physical Exam Blood pressure 106/76, pulse 75, temperature 98.1 F (36.7 C), temperature source Oral, resp. rate 20, height 5' 9.5" (1.765 m), weight 161 lb 9.6 oz (73.3 kg), SpO2 96 %.  General: Elderly African American male appearing in NAD. On [place]. Psych: Flat affect. Neuro: Alert and oriented X 3. Moves all extremities spontaneously. HEENT: Normal  Neck: Supple without bruits. JVD mildly elevated. Lungs:  Resp regular and unlabored, Rales at bases bilaterally. Heart: RRR no s3, s4, or murmurs. Abdomen: Soft, non-tender, non-distended, BS + x 4.  Extremities: No clubbing or cyanosis. Trace edema bilaterally. DP/PT/Radials 2+ and equal bilaterally.  Labs   Recent Labs  09/21/15 0056 09/21/15 0656  TROPONINI 0.08* 0.11*   Lab Results  Component Value Date   WBC 7.6 09/20/2015   HGB 9.1* 09/20/2015   HCT 27.2* 09/20/2015   MCV 98.2 09/20/2015   PLT 193 09/20/2015    Recent Labs Lab 09/21/15 0656  NA 131*  K 3.9  CL 90*  CO2 33*   BUN 10  CREATININE 1.29*  CALCIUM 8.4*  GLUCOSE 315*   Lab Results  Component Value Date   CHOL 167 11/20/2014   HDL 55 11/20/2014   LDLCALC 95 11/20/2014   TRIG 85 11/20/2014   Lab Results  Component Value Date   DDIMER 6.36* 09/14/2015    Radiology/Studies Dg Chest Portable 1 View: 09/20/2015  CLINICAL DATA:  SOB. Pt states shortness of breath and productive cough for 2 days. Denies any chest pain. Per ems family called because they were concerned about pt because since Sunday he hasnt been taking any medicines of eating. Pt states he.*comment was truncated* EXAM: PORTABLE CHEST - 1 VIEW COMPARISON:  09/14/2015 and previous FINDINGS: Stable moderate cardiomegaly. Slight worsening of perihilar and bibasilar interstitial and airspace edema or infiltrates. Probable layering pleural effusions as before. Central pulmonary vascular  congestion. No pneumothorax. Minimal spurring in the mid thoracic spine. IMPRESSION: 1. Cardiomegaly with slight worsening of bilateral edema/infiltrates, persistent layering effusions. Electronically Signed   By: Corlis Leak M.D.   On: 09/20/2015 21:13    ECG: Sinus rhythm with rate in 90's. TWI in lateral leads, noted on previous tracings.   ECHOCARDIOGRAM: 09/07/ Study Conclusions - Prior tests and procedures: EF was 30-35% on 02-27-2015. - Left ventricle: The cavity size was moderately dilated. There was mild concentric hypertrophy. Systolic function was severely reduced. The estimated ejection fraction was in the range of 10 to 15%. Diffuse hypokinesis. Doppler parameters are consistent with restrictive physiology, indicative of decreased left ventricular diastolic compliance and/or increased left atrial pressure. Doppler parameters are consistent with elevated ventricular end-diastolic filling pressure. - Ventricular septum: Septal motion showed paradox. - Aortic valve: Valve mobility was restricted. There was mild stenosis. There was  trivial regurgitation. Valve area (VTI): 1.22 cm^2. Valve area (Vmax): 1.13 cm^2. Valve area (Vmean): 1.11 cm^2. - Mitral valve: Calcified annulus. Mildly thickened leaflets . There was mild regurgitation. / Left atrium: /The atrium was mildly dilated. - Right ventricle: The cavity size was mildly dilated. Wall thickness was normal. Systolic function was moderately reduced. - Tricuspid valve: There was moderate regurgitation. - Pulmonary arteries: Systolic pressure was severely increased. PA peak pressure: 66 mm Hg (S). - Pericardium, extracardiac: A trivial pericardial effusion was identified posterior to the heart. Features were not consistent with tamponade physiology.  Impressions: - Compared to the prior study from 02/27/2015 th eleft ventricle is now moderately dilated with severely decreased systolic function and estimated LVEF 10-15%, previously 30-35%. Right ventricular function is moderately impaired.  ASSESSMENT AND PLAN  1. Acute on Chronic Systolic CHF - last echo on 07/13/2015 showed EF of 10-15%, greatly reduced from 40% in 08/2014. - was admitted for CHF exacerbation from 09/14/2015 - 09/16/2015 but left AMA and has been non-compliant with his medications since. Was noncompliant with his medications prior to that hospitalization as well.  - does not weight himself daily.  Baseline weight likely in low to mid 150's. 166lbs at time of admission. 161lbs on 09/21/2015. Continue to obtain daily weights while admitted. - continue BB and ACE-I. Continue diuresis with Lasix  IV BID for now as long as he remains compliant with labs so we can monitor his potassium and creatinine closely (refused labs on previous admission). Net output of -3.3L thus far. Continue to obtain strict I&O's.   2. HTN - BP has been 106/72 - 140/100 while admitted - continue current medication regimen.  3. Stage 3 CKD - baseline of 1.1 -1.2 - creatinine 1.29 on 09/21/2015   4.  Medication Noncompliance - importance of this expressed to the patient.   Signed, Ellsworth Lennox, PA-C 09/21/2015, 1:02 PM Pager: [phone]  Personally seen and examined. Agree with above. 74 year old with EF of 15%, noncompliance, Louis body dementia with multiple comorbidities here with acute on chronic systolic heart failure.  Agree with plan above, IV Lasix 60 mg twice a day. Excellent output so far. He was lying comfortably flat when I entered the room. Flat affect. Currently on suicide precautions.  I wonder if he would be a candidate for paramedicine program?  We will follow along.  Donato Schultz, MD

## 2015-09-21 NOTE — Progress Notes (Signed)
Inpatient Diabetes Program Recommendations  AACE/ADA: New Consensus Statement on Inpatient Glycemic Control (2015)  Target Ranges:  Prepandial:   less than 140 mg/dL      Peak postprandial:   less than 180 mg/dL (1-2 hours)      Critically ill patients:  140 - 180 mg/dL   Review of Glycemic Control  Diabetes history: DM 2 Outpatient Diabetes medications: Pt reported not taking Januvia + Amaryl at home Current orders for Inpatient glycemic control: Novolog Sensitive TID  Inpatient Diabetes Program Recommendations:  nsulin - Basal: Due to patient's glucose being in the 300's, please consider starting low dose basal insulin while inpatient, Levemir 10 units Q24hrs.  Thanks,  Christena DeemShannon Glessie Eustice RN, MSN, [facility] Inpatient Diabetes Coordinator Team Pager [phone] (8a-5p)

## 2015-09-21 NOTE — Progress Notes (Signed)
Family Medicine Teaching Service Daily Progress Note Intern Pager: [phone]  Patient name: Russell Hamilton Medical record number: [id number] Date of birth: 08-27-1941 Age: 74 y.o. Gender: male  Primary Care Provider: Ambrose Finland, NP Consultants: Heart Failure, Psychiatry Code Status: Partial- would like intubation but not chest compressions  Pt Overview and Major Events to Date:  11/15: Admitted to the FPTS with SOB, likely CHF exacerbation  Assessment and Plan: [name] is a 74 y.o. male presenting with cough and shortness of breath. 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  1. Shortness of breath: Likely due to CHF exacerbation. Last ECHO 07/2015: EF 10-15%, diffuse hypokinesis, moderate LV dilation, moderately impaired RV function. BNP 1284 (runs around 1500-1800). Pt states he has not been taking his Lasix. ACS ruled out with EKG showing sinus tachycardia, old anteroseptal infarct, and ischemia in the lateral leads, unchanged from previous EKG. Troponin was 0.08, but Pt has had elevated troponins (0/05-0.19) at previous hospitalizations. May be secondary to heart strain. Pneumonia less likely, as CXR showed cardiomegaly with slight worsening of bilateral edema/infiltrates, probably layering pleural effusions. Another concern is PE, as Pt has been tachycardic in the low 100s. He had a CTA chest at his last admission earlier in the week that was negative for PE. On exam, he has mild bibasilar crackles and no pitting edema. Weight is 164lb on admission > 161 this am (dry weight is ~150lbs). UOP 2.8L since admission. Requiring 2L O2 by nasal cannula. - Diurese with Lasix  IV bid - Heart failure team consult - Will get an ECHO. - Trend troponins: 0.08 > 0.11 - Continue home med: Carvedilol 3.125mg  bid - If his respiratory status does not improve with diuresis, will consider getting a  d-dimer to rule out PE. - SW consult for help with medication non-compliance - Case management consult for medication assistance, as Pt states he cannot afford his medications. - Daily weights, strict I/O's - Cardiac monitoring - PT/OT consult  2. Suicidal ideations: Pt states he wants to shoot himself in the head with a gun. - Suicide precautions - Will consult Psychiatry in the am.  3. COPD: No wheezes or rhonchi on exam. No increased sputum. Currently requiring 2L O2 by nasal cannula. - Continue home meds: Albuterol nebulizer and Symbicort  4. T2DM with peripheral neuropathy: Last HgbA1c (07/13/15): 8.2%. Glucose 306 this am. - Sensitive SSI - Will increase Lyrica from  bid to  tid, as Pt is having significant burning in his feet.  5. CKD III: Cr 1.16 on admission. Baseline 1-1.2. 1.29 this am. Expect a bump secondary to diuresis. - Monitor with daily BMETs - Avoid nephrotoxic agents  6. HTN: BPs have been well-controlled in the ED, ranging from 106-140/76-99.  - Continue home meds: Lisinopril 2.5mg  qd, Carvedilol 3.125mg   7. Normocytic anemia: Likely anemia of chronic disease, although ferritin in 02/2015 was low-normal (32). Hgb 9.1 on admission. - Continue home med: ferrous sulfate  bid - Will continue to monitor   8. Lewy Body Dementia: At this point, Pt appears competent to make his own health care decisions. - Will continue to monitor.  FEN/GI: Heart healthy diet, SLIV Prophylaxis: Heparin sq  Disposition: Anticipate home in 2-3 days, pending adequate diuresis. Would like to have a good plan in place as far as medication assistance and education on the importance of medication compliance before discharge.  Subjective: Pt states  that his SOB is the same as yesterday. He says that he is having severe pain in his feet. He states that he wants to shoot himself in the head with a pistol because he "just doesn't care anymore".    Objective: Temp:  [97.8 F  (36.6 C)-98.1 F (36.7 C)] 98.1 F (36.7 C) (11/16 0542) Pulse Rate:  [70-113] 75 (11/16 0542) Resp:  [18-28] 20 (11/16 0542) BP: (106-140)/(72-100) 106/76 mmHg (11/16 0542) SpO2:  [83 %-100 %] 96 % (11/16 0542) FiO2 (%):  [2 %] 2 % (11/15 2336) Weight:  [161 lb 9.6 oz (73.3 kg)-166 lb (75.297 kg)] 161 lb 9.6 oz (73.3 kg) (11/16 0542) Physical Exam: General: Patient lying in bed, Hurley in place, moaning throughout exam. Eyes: No scleral icterus, EOMI ENTM: moist mucous membranes  Neck: Supple  Cardiovascular: RRR, no murmurs, rubs, gallops Respiratory: Mild crackles in the lung bases bilaterally, no wheezes or rhonchi Abdomen: BS+, non-tender, non-distended MSK: Exquisitely tender to palpation of his ankles/feet Skin: Feet appear dry, but no lesions or rashes Psych: Cognition and judgment appear intact.  Laboratory:  Recent Labs Lab 09/14/15 1935 09/20/15 2110  WBC 8.4 7.6  HGB 8.5* 9.1*  HCT 26.4* 27.2*  PLT 196 193    Recent Labs Lab 09/14/15 1935 09/20/15 2110  NA 135 130*  K 3.0* 4.1  CL 93* 92*  CO2 30 29  BUN 13 12  CREATININE 1.41* 1.16  CALCIUM 8.5* 8.3*  GLUCOSE 215* 354*   Troponin: 0.08 BNP: 1284 ABG: pH 7.54, pCO2 35.2, pO2 52  Imaging/Diagnostic Tests: CXR: Cardiomegaly with slight worsening of bilateral edema/infiltrates, persistent laying effusions.  Campbell StallKaty Dodd Debrina Kizer, MD 09/21/2015, 6:41 AM PGY-1, [facility] FPTS Intern pager: [phone], text pages welcome

## 2015-09-21 NOTE — Hospital Discharge Follow-Up (Signed)
[facility]  Attempted to meet with the patient this afternoon and he was groggy and did not want to talk.  [facility] 1430 but will reschedule if he remains hospitalized.  [facility] has been providing transportation to his appointments at the clinic. He has consistently refused any home care services.  He has also refused to obtain the necessary financial information needed for a pharmacy assistance program for the lyrica. [facility] [place]; but missed the appointment and has not re-scheduled.   Will continue to follow his hospital progress.  Update provided to Jiles CrockerBrenda Chandler, RN CM.

## 2015-09-21 NOTE — Progress Notes (Signed)
Received consult for Medication Assistance  Patient known to me from previous admission in which he refused to talk to me about DCP and refused any HHC services. Attempted to talk to pt this am but he was asleep. Patient has insurance with Medicare part A and B. Per Shanda BumpsJessica RN at the [facility], [place]; [facility] and [facility].  CM unable to provide any assistance with medication due to patient has Insurance. Compliance is a major issue; CM will continue to follow for DCP. Abelino DerrickB Trini Christiansen [facility] CenterRN,MHA,BSN [phone]

## 2015-09-22 DIAGNOSIS — F329 Major depressive disorder, single episode, unspecified: Secondary | ICD-10-CM

## 2015-09-22 DIAGNOSIS — Z9119 Patient's noncompliance with other medical treatment and regimen: Secondary | ICD-10-CM

## 2015-09-22 DIAGNOSIS — I5023 Acute on chronic systolic (congestive) heart failure: Secondary | ICD-10-CM

## 2015-09-22 DIAGNOSIS — F0631 Mood disorder due to known physiological condition with depressive features: Secondary | ICD-10-CM | POA: Diagnosis present

## 2015-09-22 DIAGNOSIS — I5043 Acute on chronic combined systolic (congestive) and diastolic (congestive) heart failure: Secondary | ICD-10-CM

## 2015-09-22 LAB — BASIC METABOLIC PANEL
Anion gap: 9 (ref 5–15)
BUN: 25 mg/dL — ABNORMAL HIGH (ref 6–20)
CALCIUM: 7.7 mg/dL — AB (ref 8.9–10.3)
CO2: 31 mmol/L (ref 22–32)
CREATININE: 1.4 mg/dL — AB (ref 0.61–1.24)
Chloride: 89 mmol/L — ABNORMAL LOW (ref 101–111)
GFR, EST AFRICAN AMERICAN: 56 mL/min — AB (ref >60)
GFR, EST NON AFRICAN AMERICAN: 49 mL/min — AB (ref >60)
Glucose, Bld: 253 mg/dL — ABNORMAL HIGH (ref 65–99)
Potassium: 3.8 mmol/L (ref 3.5–5.1)
SODIUM: 129 mmol/L — AB (ref 135–145)

## 2015-09-22 LAB — GLUCOSE, CAPILLARY
GLUCOSE-CAPILLARY: 222 mg/dL — AB (ref 65–99)
GLUCOSE-CAPILLARY: 160 mg/dL — AB (ref 65–99)
Glucose-Capillary: 252 mg/dL — ABNORMAL HIGH (ref 65–99)
Glucose-Capillary: 245 mg/dL — ABNORMAL HIGH (ref 65–99)

## 2015-09-22 MED ORDER — METOPROLOL TARTRATE 12.5 MG HALF TABLET
12.5000 mg | ORAL_TABLET | Freq: Two times a day (BID) | ORAL | Status: DC
  Filled 2015-09-22: qty 1

## 2015-09-22 MED ORDER — OXYMETAZOLINE HCL 0.05 % NA SOLN
1.0000 | Freq: Every day | NASAL | Status: DC | PRN
  Administered 2015-09-23 – 2015-09-26 (×3): 1 via NASAL
  Filled 2015-09-22: qty 15

## 2015-09-22 NOTE — Discharge Summary (Signed)
Family Medicine Teaching [facility] Discharge Summary  Patient name: Russell Hamilton Medical record number: [id number] Date of birth: 08-30-1941 Age: 74 y.o. Gender: male Date of Admission: 09/20/2015  Date of Discharge: 09/27/15 Admitting Physician: Carney Living, MD  Primary Care Provider: Ambrose Finland, NP Consultants: Heart failure team, Psychiatry, Palliative Care  Indication for Hospitalization: Shortness of breath, likely CHF exacerbation  Discharge Diagnoses/Problem List:  Acute on chronic congestive heart failure HTN T2DM with peripheral neuropathy COPD CKD III Observed seizure-like activity Tobacco abuse History of stroke Normocytic anemia Lewy body dementia  Disposition: Home with hospice  Discharge Condition: Stable  Discharge Exam:  General: Tired-appearing but conversational, answers questions this morning, pleasant. ENTM: moist mucous membranes   Cardiovascular: RRR, no murmurs, rubs, gallops Respiratory: CTAB, no wheezes or crackles. Abdomen: BS+, non-tender, non-distended MSK: Tenderness to palpation of his ankles/feet/legs, no pitting edema; right elbow is edematous; no sling in place. Neuro: Answers questions, oriented x 2; no focal deficits Psych: Appropriate mood and affect.  Brief Hospital Course:  Russell Hamilton is a 74 year old male who presented to the ED with shortness of breath and cough. He had just been hospitalized a few days prior for CHF vs COPD exacerbation and left AMA. When he left the hospital, he did not take any of his medications. He also endorsed new suicidal ideations, stating that he wanted to shoot himself in the head with a pistol. In the ED, he was requiring 2L O2 by [place]. BNP was 1284. Troponins were 0.06 and 0.08. He was presumed to have a CHF exacerbation and was admitted for further management. Hospital course is described by problem below.  1. Acute systolic CHF exacerbation: Pt's last ECHO in 07/2015 showed an EF of 10-15%  with diffuse hypokinesis and moderate LV dilation. We diuresed him with Lasix  IV bid. Heart failure team was consulted and agreed with our plan. He had very good urine output. The crackles in his lung bases and lower extremity pitting edema that were seen on admission resolved. He received IV Lasix for 3 days and was then switched to his home dose of Lasix  PO bid because he was having some hypotension and a bump in his creatinine. He was able to be weaned from 2L O2 by nasal cannula to room air and was able to maintain his O2 saturations above 92%. Pt refused an ECHO. PT evaluated him and recommended SNF placement. Pt was initially agreeable to SNF and then later refused. Per Pt's family's request, palliative care was consulted to discuss goals of care. After their discussion, Pt stated he would like to move forward with Hospice. He later stated that he was not interested in hospice. A family meeting was held on 11/22. Russell Hamilton and his family that he would like to go home with hospice.  2. Suicidal ideations, resolved: Pt stated on admission that he wanted to shoot himself in the head with a pistol. He states that he has never had these thoughts before. He was placed on suicide precautions. Psychiatry was consulted and did not recommend any psychiatric medications. They also stated that he does not meet criteria for psychiatric inpatient admission. He did not endorse any additional suicidal ideations throughout the rest of his hospitalization.  3. Witnessed seizure-like activity: on 11/18, Pt had witness seizure-like activity by a nurse. Per report, he was frothing at the mouth and not answering questions. His legs were moving side to side and his arms were moving up and  down. The episode lasted 20 seconds and he appeared confused afterwards. His right upper extremity appeared uncoordinated after the episode. MRI brain did not show any abnormalities. EEG was normal. He did not have any additional  episodes of seizure-like activity.   4. Lewy body dementia: Throughout his hospitalization, we had some concerns about [name] ability to make his own medical decisions. He would often not remember conversations that occurred the day before. Psychiatry evaluated him and felt that he had intact cognitions and the ability to make his own medical decisions based on their brief encounter.   5. HTN: During the first part of his admission, we continued his Lisinopril and Metoprolol. He began having low blood pressures, likely related to his CHF. We stopped his home BP medications and did not restart them on discharge.   6. Right elbow pain: On 11/20, Pt began complaining of right elbow pain. On exam, he elbow was edematous and ROM was limited by pain. We ordered a right elbow x-ray to rule out fracture. The x-ray showed a joint effusion, but could not rule out overt fracture. We got a uric acid level to rule out gout, which was mildly elevated at 8.8. Even though he denied any trauma to the area, we thought his elbow pain was likely secondary to an occult fracture. We treated him with scheduled Tylenol and an arm sling.   Issues for Follow Up:  1. Mr. Arredondo has enrolled in Hospice. Please make sure he is receiving these services.  Significant Procedures: None  Significant Labs and Imaging:   Recent Labs Lab 09/20/15 2110  WBC 7.6  HGB 9.1*  HCT 27.2*  PLT 193    Recent Labs Lab 09/22/15 0550 09/23/15 0239 09/25/15 0530 09/26/15 0436 09/27/15 0244  NA 129* 130* 131* 130* 132*  K 3.8 4.0 3.6 3.8 3.7  CL 89* 89* 92* 94* 94*  CO2 31 32 GLUCOSE 253* 197* 370* 256* 273*  BUN 25* 29* 38* 34* 36*  CREATININE 1.40* 1.62* 1.75* 1.46* 1.49*  CALCIUM 7.7* 7.9* 8.1* 7.9* 8.2*   Troponin: 0.08 BNP: 1284 ABG: pH 7.54, pCO2 35.2, pO2 52 Uric acid 8.8  CXR: Cardiomegaly with slight worsening of bilateral edema/infiltrates, persistent laying effusions X-ray right elbow: Joint  effusion. Superimposed infectious process cannot be excluded. Radiographically occult fracture cannot be excluded. MRI brain: Mild to moderate chronic microvascular ischemia. No acute intracranial abnormality.  Results/Tests Pending at Time of Discharge: None  Discharge Medications:    Medication List    ASK your doctor about these medications        acetaminophen-codeine 300-30 MG tablet  Commonly known as:  TYLENOL #3  Take 1 tablet by mouth every 8 (eight) hours as needed for moderate pain.     albuterol 108 (90 BASE) MCG/ACT inhaler  Commonly known as:  PROVENTIL HFA;VENTOLIN HFA  Inhale 2 puffs into the lungs every 6 (six) hours as needed for wheezing or shortness of breath.     aspirin 81 MG chewable tablet  Chew 1 tablet (81 mg total) by mouth daily.     carvedilol 3.125 MG tablet  Commonly known as:  COREG  Take 1 tablet (3.125 mg total) by mouth 2 (two) times daily with a meal.     ferrous sulfate 325 (65 FE) MG tablet  Take 1 tablet (325 mg total) by mouth 2 (two) times daily with a meal.     Fluticasone Furoate-Vilanterol 100-25 MCG/INH Aepb  Commonly known  as:  BREO ELLIPTA  Inhale 1 puff into the lungs daily.     furosemide 40 MG tablet  Commonly known as:  LASIX  Take 1.5 tablets (60 mg total) by mouth 2 (two) times daily.     gabapentin 300 MG capsule  Commonly known as:  NEURONTIN  Take 600 mg by mouth 2 (two) times daily.     glimepiride 2 MG tablet  Commonly known as:  AMARYL  Take 1 tablet (2 mg total) by mouth daily with breakfast.     ipratropium 0.02 % nebulizer solution  Commonly known as:  ATROVENT  Take 2.5 mLs (0.5 mg total) by nebulization once.     isosorbide mononitrate 30 MG 24 hr tablet  Commonly known as:  IMDUR  Take 1 tablet (30 mg total) by mouth daily.     lisinopril 2.5 MG tablet  Commonly known as:  PRINIVIL,ZESTRIL  Take 1 tablet (2.5 mg total) by mouth daily.     omeprazole 20 MG capsule  Commonly known as:  PRILOSEC   Take 1 capsule (20 mg total) by mouth daily.     oxyCODONE-acetaminophen 5-325 MG tablet  Commonly known as:  ROXICET  Take 1 tablet by mouth every 12 (twelve) hours as needed for severe pain.     pregabalin 100 MG capsule  Commonly known as:  LYRICA  Take 1 capsule (100 mg total) by mouth 2 (two) times daily.     silver sulfADIAZINE 1 % cream  Commonly known as:  SILVADENE  Apply 1 application topically daily.     sitaGLIPtin 50 MG tablet  Commonly known as:  JANUVIA  Take 1 tablet (50 mg total) by mouth daily. For diabetes        Discharge Instructions: Please refer to Patient Instructions section of EMR for full details.  Patient was counseled important signs and symptoms that should prompt return to medical care, changes in medications, dietary instructions, activity restrictions, and follow up appointments.   Follow-Up Appointments: Follow-up Information    [facility] On [facility].   Why:  Transitional Care Clinic appointment on 10/04/15 at 2:45 pm with Dr. Venetia NightAmao.   Contact information:   [address] [place] [id number] [phone]      [facility]   Specialty:  Hospice and Palliative Medicine   Why:  They will come to your home for hospice care   Contact information:   [address]  [place] [id number] [phone]       Campbell StallKaty Dodd Mozetta Murfin, MD 09/27/2015, 2:54 PM PGY-1, [facility]

## 2015-09-22 NOTE — Progress Notes (Signed)
I stopped in and attempted to educate patient regarding his HF diagnosis and HF recommendations.  He refused to open his eyes and only grunted when spoken to.  I will plan to return and try again at a later time.

## 2015-09-22 NOTE — Care Management (Signed)
Transitional Care Clinic at [place]:  Patient known to the [facility] at [place]. Met with patient at bedside to discuss continued follow-up with the [facility]; however, patient grunting and not interested in talking. Will continue to follow patient's clinical progress and will attempt to follow-up with patient at a later time.

## 2015-09-22 NOTE — Progress Notes (Signed)
OT Cancellation Note  Patient Details Name: Nat MathJames Morocco MRN: [id number] DOB: 07/15/1941   Cancelled Treatment:    Reason Eval/Treat Not Completed: Patient declined, no reason specified  Pt reported just being up OOB to go to the bathroom and would not attempt getting back up again for this therapist.  Per sitter, pt very wobbly and needed 2 people to assist him to the bathroom.  Will re-attempt OT eval tomorrow.   Lovie Agresta,[place] OTR/L 09/22/2015, 3:34 PM

## 2015-09-22 NOTE — Progress Notes (Addendum)
Family Medicine Teaching Service Daily Progress Note Intern Pager: [phone]  Patient name: Russell Hamilton Medical record number: [id number] Date of birth: 1941-10-12 Age: 74 y.o. Gender: male  Primary Care Provider: Ambrose Finland, NP Consultants: Heart Failure, Psychiatry Code Status: Partial- would like intubation but not chest compressions  Pt Overview and Major Events to Date:  11/15: Admitted to the FPTS with SOB, likely CHF exacerbation  Assessment and Plan: Russell Hamilton is a 74 y.o. male presenting with cough and shortness of breath. 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  CHF Exacerbation. Last ECHO 07/2015: EF 10-15%, diffuse hypokinesis, moderate LV dilation, moderately impaired RV function. BNP 1284 (runs around 1500-1800). On exam, only very faint crackles are auscultated, improved from yesterday. Weight is 164lb on admission > 163 this am (dry weight is ~150lbs). UOP 1.9L in the last 24 hours, net -3.4L since admission. O2 sats ranging from 91-100% on 2L O2 by nasal cannula. Creatinine 1.40 this morning. Baseline 1.1-1.2. - Diurese with Lasix  IV bid - Heart failure team consult. Agree with our current plans. - He refused his ECHO yesterday. - Trend troponins: 0.08 > 0.11 > 0.12 > 0.12 - Will switch Carvedilol to Lopressor 12.5mg  bid, as his BPs have been low in the last 24 hours. - Wean O2 as tolerated. - SW consult for help with medication non-compliance - Case management consult for medication assistance, as Pt states he cannot afford his medications. He told them that he has no problems getting his medications and stated there was nothing they could do for them. CM has signed off. - Daily weights, strict I/O's - Cardiac monitoring - PT recommending SNF. - OT consult.  Suicidal ideations: Pt stated he wanted to shoot himself in the head with a gun on admission and  yesterday morning. He states he is not having suicidal ideations this morning. - Suicide precautions, sitter at bedside - Psych consult.  Hyponatremia: Na of 129 this morning, stable from 130 on admission. Took in 1.3L PO. May be secondary to excess water intake. - Will fluid restrict - Monitor with daily BMETs.  COPD: No wheezes or rhonchi on exam. No increased sputum. Saturations ranging from 91-99% on 2L O2 by nasal cannula. Have decreased him down to 1.5L this morning. - Continue home meds: Albuterol nebulizer and Symbicort  T2DM with peripheral neuropathy: Last HgbA1c (07/13/15): 8.2%. CBGs ranging from 202-306 this am. - Sensitive SSI - Lyrica  tid  CKD III: Cr 1.16 on admission > 1.49 today. Baseline 1-1.2. Expect a bump secondary to diuresis. - Monitor with daily BMETs - Avoid nephrotoxic agents  Hypotension: Pt has hypertension at baseline, but BPs have ranged from 86-102/49 over the last 24 hours, likely secondary to diuresis.  - Will continue home meds: Lisinopril 2.5mg  qd. Will switch Carvedilol to Lopressor 12.5mg  bid to help with BPs. - Will monitor BPs closely.  Normocytic anemia: Likely anemia of chronic disease, although ferritin in 02/2015 was low-normal (32). Hgb 9.1 on admission. - Continue home med: ferrous sulfate  bid - Will continue to monitor   Lewy Body Dementia: At this point, we have some concerns about his ability to make his own medical decisions. - Will continue to monitor. - Will ask Psych to evaluate him for decision-making capacity.  FEN/GI: Heart healthy diet, SLIV Prophylaxis: Heparin sq  Disposition: Anticipate home in 2-3 days, pending adequate diuresis. Would like  to have a good plan in place as far as medication assistance and education on the importance of medication compliance before discharge.  Subjective:  Pt states he is hungry this morning. He endorses burning in his feet. He states that he does not remember refusing the ECHO  yesterday. He is not having any suicidal ideations this morning. He has no concerns other than the fact that he wants more food.  Objective: Temp:  [97.4 F (36.3 C)-98.4 F (36.9 C)] 97.4 F (36.3 C) (11/17 0522) Pulse Rate:  [66-97] 92 (11/17 0522) Resp:  [20-22] 20 (11/17 0522) BP: (86-102)/(49-64) 86/49 mmHg (11/17 0522) SpO2:  [91 %-100 %] 100 % (11/17 0522) Weight:  [163 lb 4.8 oz (74.072 kg)] 163 lb 4.8 oz (74.072 kg) (11/17 0518) Physical Exam: General: Patient sitting up in bed eating breakfast, subsequently lays down, in NAD. Eyes: No scleral icterus, EOMI ENTM: moist mucous membranes  Neck: Supple  Cardiovascular: RRR, no murmurs, rubs, gallops Respiratory: Faint crackles in the lung bases bilaterally, improved from yesterday, no wheezes or rhonchi Abdomen: BS+, non-tender, non-distended MSK: Exquisitely tender to palpation of his ankles/feet Skin: Feet appear dry, but no lesions or rashes, multiple excoriations present on back. Psych: Answers questions appropriately.  Laboratory:  Recent Labs Lab 09/20/15 2110  WBC 7.6  HGB 9.1*  HCT 27.2*  PLT 193    Recent Labs Lab 09/20/15 2110 09/21/15 0656 09/22/15 0550  NA 130* 131* 129*  K 4.1 3.9 3.8  CL 92* 90* 89*  CO2 29 33* 31  BUN 12 10 25*  CREATININE 1.16 1.29* 1.40*  CALCIUM 8.3* 8.4* 7.7*  GLUCOSE 354* 315* 253*   Troponin: 0.08 BNP: 1284 ABG: pH 7.54, pCO2 35.2, pO2 52  Imaging/Diagnostic Tests: CXR: Cardiomegaly with slight worsening of bilateral edema/infiltrates, persistent laying effusions.  Campbell StallKaty Dodd Anistyn Graddy, MD 09/22/2015, 7:12 AM PGY-1, [facility] FPTS Intern pager: [phone], text pages welcome

## 2015-09-22 NOTE — Progress Notes (Signed)
Pt continues with suicide sitter. Pt has not complained of made threatening remarks about injuring himself this shift. Pt has been cooperative with care this shift.

## 2015-09-22 NOTE — Progress Notes (Signed)
Patient Name: Russell Hamilton Date of Encounter: 09/22/2015  Principal Problem:   Other depression due to general medical condition Active Problems:   CHF exacerbation (HCC)   Acute on chronic congestive heart failure (HCC)   Chronic bronchitis (HCC)   Secondary hypertension, unspecified   Uncontrolled type 2 diabetes mellitus with ketoacidosis without coma, without long-term current use of insulin (HCC)   Personal history of noncompliance with medical treatment   Suicidal ideation   Length of Stay: 2  SUBJECTIVE  The patient looks comfortable sleeping, difficult to wake up. Sitter in his room.  CURRENT MEDS . aspirin  81 mg Oral Daily  . budesonide-formoterol  2 puff Inhalation BID  . carvedilol  3.125 mg Oral BID WC  . ferrous sulfate  325 mg Oral BID WC  . furosemide  60 mg Intravenous BID  . heparin  5,000 Units Subcutaneous 3 times per day  . insulin aspart  0-9 Units Subcutaneous TID WC  . isosorbide mononitrate  30 mg Oral Daily  . lisinopril  2.5 mg Oral Daily  . pregabalin  100 mg Oral TID  . sodium chloride  3 mL Intravenous Q12H   OBJECTIVE  Filed Vitals:   09/21/15 2039 09/22/15 0518 09/22/15 0522 09/22/15 0941  BP: 102/49  86/49 82/49  Pulse: 66  92 90  Temp: 98.4 F (36.9 C)  97.4 F (36.3 C) 97.8 F (36.6 C)  TempSrc: Oral  Axillary Oral  Resp: 22  20   Height:      Weight:  163 lb 4.8 oz (74.072 kg)    SpO2: 91%  100% 90%    Intake/Output Summary (Last 24 hours) at 09/22/15 1127 Last data filed at 09/22/15 0903  Gross per 24 hour  Intake   1422 ml  Output   1990 ml  Net   -568 ml   Filed Weights   09/20/15 2337 09/21/15 0542 09/22/15 0518  Weight: 164 lb 14.4 oz (74.798 kg) 161 lb 9.6 oz (73.3 kg) 163 lb 4.8 oz (74.072 kg)    PHYSICAL EXAM  General: Pleasant, NAD. Neuro: Alert and oriented X 3. Moves all extremities spontaneously. Psych: Normal affect. HEENT:  Normal  Neck: Supple without bruits or JVD. Lungs:  Resp regular and  unlabored, crackles B/L. Heart: RRR no s3, s4, or murmurs. Abdomen: Soft, non-tender, non-distended, BS + x 4.  Extremities: No clubbing, cyanosis or edema. DP/PT/Radials 2+ and equal bilaterally.  Accessory Clinical Findings  CBC  Recent Labs  09/20/15 2110  WBC 7.6  NEUTROABS 2.1  HGB 9.1*  HCT 27.2*  MCV 98.2  PLT 193   Basic Metabolic Panel  Recent Labs  09/21/15 0656 09/22/15 0550  NA 131* 129*  K 3.9 3.8  CL 90* 89*  CO2 33* 31  GLUCOSE 315* 253*  BUN 10 25*  CREATININE 1.29* 1.40*  CALCIUM 8.4* 7.7*    Recent Labs  09/21/15 0656 09/21/15 1214 09/21/15 1908  TROPONINI 0.11* 0.12* 0.12*   Radiology/Studies  Ct Angio Chest Pe W/cm &/or Wo Cm  09/14/2015  CLINICAL DATA:  Shortness of breath, hypoxia, and cough. IMPRESSION: 1. No pulmonary embolus. 2. Congestive heart failure with pulmonary edema and moderate pleural effusions. 3. Small to moderate pericardial effusion. Electronically Signed   By: Rubye OaksMelanie  Ehinger M.D.   On: 09/14/2015 21:22   TELE: SR    ASSESSMENT AND PLAN  1. Acute on Chronic Systolic CHF - last echo on 07/13/2015 showed EF of 10-15%, greatly reduced from  40% in 08/2014. - was admitted for CHF exacerbation from 09/14/2015 - 09/16/2015 but left AMA and has been non-compliant with his medications since. Was noncompliant with his medications prior to that hospitalization as well.  - does not weight himself daily. Baseline weight likely in low to mid 150's. 166lbs at time of admission. 163 this am. Continue to obtain daily weights while admitted. - continue BB and ACE-I. Continue diuresis with Lasix  IV BID for now as long as he remains compliant with labs so we can monitor his potassium and creatinine closely (refused labs on previous admission). Net output of -3.3L thus far. Continue to obtain strict I&O's. Good urine output so far. Crea 1.29 --> 1.4.   2. HTN - BP has been 106/72 - 140/100 while admitted - continue current  medication regimen.  3. Stage 3 CKD - baseline of 1.1 -1.2 - creatinine 1.29 on 09/21/2015   4. Medication Noncompliance - importance of this expressed to the patient.  Signed, Lars Masson MD, [facility] 09/22/2015

## 2015-09-22 NOTE — Consult Note (Signed)
[facility] Consult   Reason for Consult:  Depression with suicide ideation and multiple medical problems Referring Physician:  Dr. Ardelia Mems Patient Identification: Russell Hamilton MRN:  [id number] Principal Diagnosis: Other depression due to general medical condition Diagnosis:   Patient Active Problem List   Diagnosis Date Noted  . 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 Personal history of noncompliance with medical treatment [Z91.19] 09/21/2015  . Suicidal ideation [R45.851]   . CHF exacerbation ([place]) [I50.9] 09/20/2015  . Acute on chronic congestive heart failure ([place]) [I50.9]   . Chronic bronchitis ([place]) [J42]   . Secondary hypertension, unspecified [I15.9]   . Uncontrolled type 2 diabetes mellitus with ketoacidosis without coma, without long-term current use of insulin ([place]) [E13.10]   . Acute renal insufficiency [N28.9] 09/16/2015  . Renal insufficiency [N28.9]   . Sprain of left knee [S83.92XA]   . Fall at home [W19.XXXA, Y92.099]   . Hypoxia [R09.02]   . Normochromic normocytic anemia [D64.9]   . Prolonged QT interval [I45.81]   . COPD exacerbation ([place]) [J44.1] 09/14/2015  . Chronic pain syndrome [G89.4] 09/08/2015  . Neutropenia ([place]) [D70.9] 07/13/2015  . CHF (congestive heart failure) ([place]) [I50.9] 07/13/2015  . Acute on chronic combined systolic and diastolic congestive heart failure ([place]) [I50.43] 06/12/2015  . Orthostatic hypotension [I95.1] 05/09/2015  . Tinea pedis [B35.3] 04/26/2015  . Benign paroxysmal positional vertigo [H81.10] 04/12/2015  . GERD (gastroesophageal reflux disease) [K21.9] 04/05/2015  . 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 COPD (chronic obstructive pulmonary disease) ([place]) [J44.9] 03/26/2015  . COPD with acute exacerbation ([place]) [J44.1] 03/26/2015  . Chronic kidney disease, stage 3 [N18.3] 03/01/2015  . Type 2 diabetes mellitus with diabetic chronic kidney disease ([place]) [E11.22]    . Unstable angina ([place]) [I20.0] 02/25/2015  . Congestive heart failure ([place]) [I50.9] 02/24/2015  . Cough, persistent [R05] 02/08/2015  . Tobacco abuse [Z72.0] 02/08/2015  . Acute diastolic CHF (congestive heart failure) ([place]) [I50.31] 12/12/2014  . Acute systolic CHF (congestive heart failure) (HCC) [I50.21] 12/12/2014  . Acute CHF ([place]) [I50.9] 12/11/2014  . AKI (acute kidney injury) ([place]) [N17.9] 11/22/2014  . Hypokalemia [E87.6] 11/21/2014  . Elevated troponin [R79.89] 11/20/2014  . Acute on chronic combined systolic and diastolic heart failure ([place]) [I50.43] 11/20/2014  . Foot ulcer ([place]) [W09.811] 11/20/2014  . NSTEMI (non-ST elevated myocardial infarction) ([place]) [I21.4]   . Pleural effusion [J90]   . Acute respiratory failure with hypoxia ([place]) [J96.01]   . Protein-calorie malnutrition, severe ([place]) [E43] 08/13/2014  . Chronic systolic CHF (congestive heart failure) ([place]) [I50.22]   . Type II diabetes mellitus with renal manifestations, uncontrolled (HCC) [E11.29, E11.65]   . Hypertension [I10]   . Stroke [facility]) [I63.9]   . Acute systolic CHF (congestive heart failure), NYHA class 4 (HCC) [I50.21] 08/12/2014  . History of stroke [Z86.73] 08/12/2014  . HTN (hypertension) [I10] 08/12/2014  . Peripheral neuropathy ([place]) [G62.9] 08/12/2014    Total Time spent with patient: 45 minutes  Subjective:   Russell Hamilton is a 74 y.o. male patient admitted with depression and suicide ideation.  HPI:  Russell Hamilton is a 73 y.o. male seen, chart reviewed, case discussed with family medicine resident physician and psychiatric social service for face-to-face psychiatry consultation and evaluation of possible depression and suicidal ideation. Reportedly patient made a suicidal statement on admission but later denied current suicidal ideation, intention or plans. Patient has no homicidal ideation, intention or plan. Patient has no evidence of psychotic symptoms.  Patient has been suffering with multiple  medical problems. Patient stated he has been living by himself in an apartment in [place] and has no help from the family and friends. Patient seems to be drowsy, sleepy and intermittently waking up with verbal stimuli and briefly responding to the questions. Later patient feel more frustrated and annoyed and stated he does not want to cooperate with this evaluation any longer. Patient also requested not to contact any family members including his brother and niece whose contact number site available on the chart. Staff member reported patient niece as interested in this morning. Patient seems to have intact cognition, and memory and language functions but simply did not cooperate to for comprehensive evaluation of capacity.  Past Psychiatric History: Patient has no previous history of acute psychiatric hospitalization or medication management.  Risk to Self: Is patient at risk for suicide?: Yes Risk to Others:   Prior Inpatient Therapy:   Prior Outpatient Therapy:    Past Medical History:  Past Medical History  Diagnosis Date  . Hypertension   . Chronic systolic CHF (congestive heart failure) ([place])     a. 2D ECHO 08/13/14: EF 40%, diffuse hypokinesis possibly worse in the inferior wall. Mild MR, mild LA dilation, PA pressure 50. Trivial pericardial effusion.  . Acute systolic CHF (congestive heart failure), NYHA class 4 ([place]) 08/12/2014  . Type II diabetes mellitus ([place])   . GERD (gastroesophageal reflux disease)   . Peripheral neuropathy ([place])     Archie Endo 08/12/2014  . Stroke [facility]) 2010    "memory problems since; right leg and left arm weakness since" (02/08/2015)  . Ulcers of both lower extremities ([place])     Archie Endo 02/24/2015  . Noncompliance     Archie Endo 02/24/2015  . COPD (chronic obstructive pulmonary disease) ([place])   . Asthma   . Renal insufficiency     Past Surgical History  Procedure Laterality Date  . Carotid endarterectomy Right 2010  . Hand ligament reconstruction Right ?1970's     "pinky"  . Foot surgery Left     debridement    2016   Family History:  Family History  Problem Relation Age of Onset  . Kidney disease Mother   . Heart attack Brother     X 2  . Cancer Brother    Family Psychiatric  History: Unknown Social History:  History  Alcohol Use No    Comment: 02/08/2015 "I might have a beer q 6 months"     History  Drug Use  . Yes  . Special: Marijuana    Comment: last marijuana use 08/16/15    Social History   Social History  . Marital Status: Married    Spouse Name: N/A  . Number of Children: N/A  . Years of Education: N/A   Social History Main Topics  . Smoking status: Current Every Day Smoker -- 0.50 packs/day for 56 years    Types: Cigarettes  . Smokeless tobacco: Never Used  . Alcohol Use: No     Comment: 02/08/2015 "I might have a beer q 6 months"  . Drug Use: Yes    Special: Marijuana     Comment: last marijuana use 08/16/15  . Sexual Activity: Not Currently   Other Topics Concern  . None   Social History Narrative   Additional Social History:                          Allergies:  No Known  Allergies  Labs:  Results for orders placed or performed during the hospital encounter of 09/20/15 (from the past 48 hour(s))  Basic metabolic panel     Status: Abnormal   Collection Time: 09/20/15  9:10 PM  Result Value Ref Range   Sodium 130 (L) 135 - 145 mmol/L   Potassium 4.1 3.5 - 5.1 mmol/L   Chloride 92 (L) 101 - 111 mmol/L   CO2 29 22 - 32 mmol/L   Glucose, Bld 354 (H) 65 - 99 mg/dL   BUN 12 6 - 20 mg/dL   Creatinine, Ser 1.16 0.61 - 1.24 mg/dL   Calcium 8.3 (L) 8.9 - 10.3 mg/dL   GFR calc non Af Amer >60 >60 mL/min   GFR calc Af Amer >60 >60 mL/min    Comment: (NOTE) The eGFR has been calculated using the CKD EPI equation. This calculation has not been validated in all clinical situations. eGFR's persistently <60 mL/min signify possible Chronic Kidney Disease.    Anion gap 9 5 - 15  CBC with Differential      Status: Abnormal   Collection Time: 09/20/15  9:10 PM  Result Value Ref Range   WBC 7.6 4.0 - 10.5 K/uL   RBC 2.77 (L) 4.22 - 5.81 MIL/uL   Hemoglobin 9.1 (L) 13.0 - 17.0 g/dL   HCT 27.2 (L) 39.0 - 52.0 %   MCV 98.2 78.0 - 100.0 fL   MCH 32.9 26.0 - 34.0 pg   MCHC 33.5 30.0 - 36.0 g/dL   RDW 20.2 (H) 11.5 - 15.5 %   Platelets 193 150 - 400 K/uL    Comment: PLATELET COUNT CONFIRMED BY SMEAR   Neutrophils Relative % 27 %   Lymphocytes Relative 19 %   Monocytes Relative 54 %   Eosinophils Relative 0 %   Basophils Relative 0 %   Neutro Abs 2.1 1.7 - 7.7 K/uL   Lymphs Abs 1.4 0.7 - 4.0 K/uL   Monocytes Absolute 4.1 (H) 0.1 - 1.0 K/uL   Eosinophils Absolute 0.0 0.0 - 0.7 K/uL   Basophils Absolute 0.0 0.0 - 0.1 K/uL   RBC Morphology POLYCHROMASIA PRESENT    WBC Morphology BLASTS   Brain natriuretic peptide     Status: Abnormal   Collection Time: 09/20/15  9:10 PM  Result Value Ref Range   B Natriuretic Peptide 1284.0 (H) 0.0 - 100.0 pg/mL  I-Stat arterial blood gas, ED     Status: Abnormal   Collection Time: 09/20/15  9:10 PM  Result Value Ref Range   pH, Arterial 7.540 (H) 7.350 - 7.450   pCO2 arterial 35.2 35.0 - 45.0 mmHg   pO2, Arterial 52.0 (L) 80.0 - 100.0 mmHg   Bicarbonate 30.1 (H) 20.0 - 24.0 mEq/L   TCO2 31 0 - 100 mmol/L   O2 Saturation 91.0 %   Acid-Base Excess 7.0 (H) 0.0 - 2.0 mmol/L   Patient temperature 98.0 F    Sample type ARTERIAL   I-stat troponin, ED     Status: None   Collection Time: 09/20/15  9:14 PM  Result Value Ref Range   Troponin i, poc 0.06 0.00 - 0.08 ng/mL   Comment 3            Comment: Due to the release kinetics of cTnI, a negative result within the first hours of the onset of symptoms does not rule out myocardial infarction with certainty. If myocardial infarction is still suspected, repeat the test at appropriate intervals.   I-Stat Venous  Blood Gas, ED (order at [facility] and [facility] only)     Status: Abnormal   Collection Time: 09/20/15   9:17 PM  Result Value Ref Range   pH, Ven 7.532 (H) 7.250 - 7.300   pCO2, Ven 37.8 (L) 45.0 - 50.0 mmHg   pO2, Ven 20.0 (LL) 30.0 - 45.0 mmHg   Bicarbonate 31.7 (H) 20.0 - 24.0 mEq/L   TCO2 33 0 - 100 mmol/L   O2 Saturation 39.0 %   Acid-Base Excess 8.0 (H) 0.0 - 2.0 mmol/L   Patient temperature HIDE    Sample type VENOUS    Comment NOTIFIED PHYSICIAN   Troponin I     Status: Abnormal   Collection Time: 09/21/15 12:56 AM  Result Value Ref Range   Troponin I 0.08 (H) <0.031 ng/mL    Comment:        PERSISTENTLY INCREASED TROPONIN VALUES IN THE RANGE OF 0.04-0.49 ng/mL CAN BE SEEN IN:       -UNSTABLE ANGINA       -CONGESTIVE HEART FAILURE       -MYOCARDITIS       -CHEST TRAUMA       -ARRYHTHMIAS       -LATE PRESENTING MYOCARDIAL INFARCTION       -COPD   CLINICAL FOLLOW-UP RECOMMENDED.   Glucose, capillary     Status: Abnormal   Collection Time: 09/21/15  6:15 AM  Result Value Ref Range   Glucose-Capillary 306 (H) 65 - 99 mg/dL   Comment 1 Notify RN    Comment 2 Document in Chart   Basic metabolic panel     Status: Abnormal   Collection Time: 09/21/15  6:56 AM  Result Value Ref Range   Sodium 131 (L) 135 - 145 mmol/L   Potassium 3.9 3.5 - 5.1 mmol/L   Chloride 90 (L) 101 - 111 mmol/L   CO2 33 (H) 22 - 32 mmol/L   Glucose, Bld 315 (H) 65 - 99 mg/dL   BUN 10 6 - 20 mg/dL   Creatinine, Ser 1.29 (H) 0.61 - 1.24 mg/dL   Calcium 8.4 (L) 8.9 - 10.3 mg/dL   GFR calc non Af Amer 54 (L) >60 mL/min   GFR calc Af Amer >60 >60 mL/min    Comment: (NOTE) The eGFR has been calculated using the CKD EPI equation. This calculation has not been validated in all clinical situations. eGFR's persistently <60 mL/min signify possible Chronic Kidney Disease.    Anion gap 8 5 - 15  Troponin I     Status: Abnormal   Collection Time: 09/21/15  6:56 AM  Result Value Ref Range   Troponin I 0.11 (H) <0.031 ng/mL    Comment:        PERSISTENTLY INCREASED TROPONIN VALUES IN THE RANGE OF  0.04-0.49 ng/mL CAN BE SEEN IN:       -UNSTABLE ANGINA       -CONGESTIVE HEART FAILURE       -MYOCARDITIS       -CHEST TRAUMA       -ARRYHTHMIAS       -LATE PRESENTING MYOCARDIAL INFARCTION       -COPD   CLINICAL FOLLOW-UP RECOMMENDED.   Troponin I     Status: Abnormal   Collection Time: 09/21/15 12:14 PM  Result Value Ref Range   Troponin I 0.12 (H) <0.031 ng/mL    Comment:        PERSISTENTLY INCREASED TROPONIN VALUES IN THE RANGE OF 0.04-0.49 ng/mL CAN BE SEEN  IN:       -UNSTABLE ANGINA       -CONGESTIVE HEART FAILURE       -MYOCARDITIS       -CHEST TRAUMA       -ARRYHTHMIAS       -LATE PRESENTING MYOCARDIAL INFARCTION       -COPD   CLINICAL FOLLOW-UP RECOMMENDED.   Glucose, capillary     Status: Abnormal   Collection Time: 09/21/15  4:23 PM  Result Value Ref Range   Glucose-Capillary 202 (H) 65 - 99 mg/dL   Comment 1 Notify RN   Troponin I     Status: Abnormal   Collection Time: 09/21/15  7:08 PM  Result Value Ref Range   Troponin I 0.12 (H) <0.031 ng/mL    Comment:        PERSISTENTLY INCREASED TROPONIN VALUES IN THE RANGE OF 0.04-0.49 ng/mL CAN BE SEEN IN:       -UNSTABLE ANGINA       -CONGESTIVE HEART FAILURE       -MYOCARDITIS       -CHEST TRAUMA       -ARRYHTHMIAS       -LATE PRESENTING MYOCARDIAL INFARCTION       -COPD   CLINICAL FOLLOW-UP RECOMMENDED.   Basic metabolic panel     Status: Abnormal   Collection Time: 09/22/15  5:50 AM  Result Value Ref Range   Sodium 129 (L) 135 - 145 mmol/L   Potassium 3.8 3.5 - 5.1 mmol/L   Chloride 89 (L) 101 - 111 mmol/L   CO2 31 22 - 32 mmol/L   Glucose, Bld 253 (H) 65 - 99 mg/dL   BUN 25 (H) 6 - 20 mg/dL   Creatinine, Ser 1.40 (H) 0.61 - 1.24 mg/dL   Calcium 7.7 (L) 8.9 - 10.3 mg/dL   GFR calc non Af Amer 49 (L) >60 mL/min   GFR calc Af Amer 56 (L) >60 mL/min    Comment: (NOTE) The eGFR has been calculated using the CKD EPI equation. This calculation has not been validated in all clinical  situations. eGFR's persistently <60 mL/min signify possible Chronic Kidney Disease.    Anion gap 9 5 - 15  Glucose, capillary     Status: Abnormal   Collection Time: 09/22/15  7:03 AM  Result Value Ref Range   Glucose-Capillary 252 (H) 65 - 99 mg/dL    Current Facility-Administered Medications  Medication Dose Route Frequency Provider Last Rate Last Dose  . 0.9 %  sodium chloride infusion  250 mL Intravenous PRN Rosemarie Ax, MD      . acetaminophen (TYLENOL) tablet 650 mg  650 mg Oral Q4H PRN Rosemarie Ax, MD      . albuterol (PROVENTIL) (2.5 MG/3ML) 0.083% nebulizer solution 2.5 mg  2.5 mg Inhalation Q6H PRN Rosemarie Ax, MD      . aspirin chewable tablet 81 mg  81 mg Oral Daily Rosemarie Ax, MD   81 mg at 09/22/15 0948  . budesonide-formoterol (SYMBICORT) 160-4.5 MCG/ACT inhaler 2 puff  2 puff Inhalation BID Rosemarie Ax, MD   2 puff at 09/21/15 2339  . carvedilol (COREG) tablet 3.125 mg  3.125 mg Oral BID WC Erma Heritage, PA   3.125 mg at 09/22/15 0948  . ferrous sulfate tablet 325 mg  325 mg Oral BID WC Leeanne Rio, MD   325 mg at 09/22/15 0948  . furosemide (LASIX) injection 60 mg  60 mg Intravenous BID  Rosemarie Ax, MD   60 mg at 09/22/15 0949  . heparin injection 5,000 Units  5,000 Units Subcutaneous 3 times per day Rosemarie Ax, MD   5,000 Units at 09/21/15 1400  . insulin aspart (novoLOG) injection 0-9 Units  0-9 Units Subcutaneous TID WC Rosemarie Ax, MD   5 Units at 09/22/15 [phone]  . isosorbide mononitrate (IMDUR) 24 hr tablet 30 mg  30 mg Oral Daily Rosemarie Ax, MD   30 mg at 09/22/15 0948  . lisinopril (PRINIVIL,ZESTRIL) tablet 2.5 mg  2.5 mg Oral Daily Rosemarie Ax, MD   2.5 mg at 09/22/15 0948  . ondansetron (ZOFRAN) injection 4 mg  4 mg Intravenous Q6H PRN Rosemarie Ax, MD      . oxyCODONE-acetaminophen (PERCOCET/ROXICET) 5-325 MG per tablet 1 tablet  1 tablet Oral Q12H PRN Rosemarie Ax, MD   1 tablet at 09/22/15  0005  . pregabalin (LYRICA) capsule 100 mg  100 mg Oral TID Sela Hua, MD   100 mg at 09/22/15 0949  . sodium chloride 0.9 % injection 3 mL  3 mL Intravenous Q12H Rosemarie Ax, MD   3 mL at 09/22/15 1000  . sodium chloride 0.9 % injection 3 mL  3 mL Intravenous PRN Rosemarie Ax, MD        Musculoskeletal: Strength & Muscle Tone: decreased Gait & Station: unable to stand Patient leans: N/A  Psychiatric Specialty Exam: ROS patient has been semi-sedated, drowsy and repeatedly falling asleep while talking and does not appear to be in clinical distress.   Blood pressure 82/49, pulse 90, temperature 97.8 F (36.6 C), temperature source Oral, resp. rate 20, height 5' 9.5" (1.765 m), weight 74.072 kg (163 lb 4.8 oz), SpO2 90 %.Body mass index is 23.78 kg/(m^2).  General Appearance: Guarded  Eye Contact::  Fair  Speech:  Blocked, Slow, Slurred and sometime loud  Volume:  Decreased  Mood:  Angry, Anxious and Irritable  Affect:  Constricted and Depressed  Thought Process:  Coherent and Goal Directed  Orientation:  Full (Time, Place, and Person)  Thought Content:  WDL  Suicidal Thoughts:  No  Homicidal Thoughts:  No  Memory:  Immediate;   Fair Recent;   Fair  Judgement:  Fair  Insight:  Fair  Psychomotor Activity:  Decreased  Concentration:  Fair  Recall:  [employer] of Knowledge:Fair  Language: Fair  Akathisia:  Negative  Handed:  Right  AIMS (if indicated):     Assets:  Communication Skills Desire for Improvement Financial Resources/Insurance Housing Leisure Time Resilience  ADL's:  Impaired  Cognition: WNL and Impaired,  Mild  Sleep:      Treatment Plan Summary: Daily contact with patient to assess and evaluate symptoms and progress in treatment and Medication management  Patient has no safety concerns denied active suicidal or homicidal ideation and psychosis Patient psychiatrically stable except poorly cooperative during this evaluation No psychiatric  medication recommended at this time No family members contact because patient refused to consent Refer to the psychiatric social service for colorectal information Based on my brief evaluation patient seems to have intact cognitions and capacity to make his own medical decisions. Please contact me again if he becomes more cooperative to for comprehensive/complete evaluation if needed Appreciate psychiatric consultation will sign off at this time Please contact [phone] or [phone] if needs further assistance   Disposition: Patient does not meet criteria for psychiatric inpatient admission. Supportive therapy provided about  ongoing stressors.  Jami Ohlin,JANARDHAHA R. 09/22/2015 10:55 AM

## 2015-09-23 ENCOUNTER — Inpatient Hospital Stay (HOSPITAL_COMMUNITY): Payer: Medicare Other

## 2015-09-23 ENCOUNTER — Ambulatory Visit: Payer: Medicare Other | Admitting: Family Medicine

## 2015-09-23 DIAGNOSIS — Z515 Encounter for palliative care: Secondary | ICD-10-CM

## 2015-09-23 DIAGNOSIS — R569 Unspecified convulsions: Secondary | ICD-10-CM | POA: Insufficient documentation

## 2015-09-23 LAB — GLUCOSE, CAPILLARY
GLUCOSE-CAPILLARY: 206 mg/dL — AB (ref 65–99)
GLUCOSE-CAPILLARY: 255 mg/dL — AB (ref 65–99)
Glucose-Capillary: 264 mg/dL — ABNORMAL HIGH (ref 65–99)
Glucose-Capillary: 234 mg/dL — ABNORMAL HIGH (ref 65–99)
Glucose-Capillary: 204 mg/dL — ABNORMAL HIGH (ref 65–99)

## 2015-09-23 LAB — BASIC METABOLIC PANEL
ANION GAP: 9 (ref 5–15)
BUN: 29 mg/dL — ABNORMAL HIGH (ref 6–20)
CALCIUM: 7.9 mg/dL — AB (ref 8.9–10.3)
CO2: 32 mmol/L (ref 22–32)
CREATININE: 1.62 mg/dL — AB (ref 0.61–1.24)
Chloride: 89 mmol/L — ABNORMAL LOW (ref 101–111)
GFR calc non Af Amer: 41 mL/min — ABNORMAL LOW (ref >60)
GFR, EST AFRICAN AMERICAN: 47 mL/min — AB (ref >60)
Glucose, Bld: 197 mg/dL — ABNORMAL HIGH (ref 65–99)
Potassium: 4 mmol/L (ref 3.5–5.1)
SODIUM: 130 mmol/L — AB (ref 135–145)

## 2015-09-23 MED ORDER — METOPROLOL TARTRATE 12.5 MG HALF TABLET
12.5000 mg | ORAL_TABLET | Freq: Two times a day (BID) | ORAL | Status: DC
  Filled 2015-09-23: qty 1

## 2015-09-23 MED ORDER — FUROSEMIDE 40 MG PO TABS
60.0000 mg | ORAL_TABLET | Freq: Two times a day (BID) | ORAL | Status: DC
  Administered 2015-09-23 – 2015-09-27 (×9): 60 mg via ORAL
  Filled 2015-09-23 (×9): qty 1

## 2015-09-23 NOTE — Care Management (Signed)
[facility]:  [facility]  Spoke with patient's bedside RN, Toma CopierBethany, who indicated patient had a possible seizure this morning; she also indicated patient very weak and tired at this time.  Patient's follow-up appointment with Dr. Venetia NightAmao moved from today to 10/04/15 at 1445.  AVS updated.  Jiles CrockerBrenda Chandler, RN CM updated as well. Will continue to follow patient's clinical progress closely.

## 2015-09-23 NOTE — Progress Notes (Signed)
Palliative:  Full note to follow. I met today with Russell Hamilton. We discuss his declining health with progressing heart failure. He understands that this is unfortunately going to get worse instead of better. When we discuss end of life he tells me that he is not concerned about this and expresses that he has faith that God will take him when he is ready - "not before and not later." He goes on to tell me that he does NOT want to be resuscitated. Confirmed with him what this means - DNR confirmed. He expresses concern that his brother would not agree with this decision - I offer to call his family to explain but he does not want me to call his family.   We also discussed how hospice can help with him to be comfortable and happy at home while helping him get the medications he needs to manage his pain (he says he cannot get these and cannot afford them). Reports relief with Lyrica and Percocet. He tells me he is tired and wants a nap - he will think further about the benefit of hospice for him. However, I am unsure if he will be safe to return home as he appears very weak and struggles with even holding his food/drink.   I will follow up Monday. Please call for acute palliative needs over the weekend.   Vinie Sill, NP Palliative Medicine Team Pager # [phone] (M-F 8a-5p) Team Phone # [phone] (Nights/Weekends)

## 2015-09-23 NOTE — Evaluation (Signed)
Occupational Therapy Evaluation Patient Details Name: Russell Hamilton MRN: [id number] DOB: 06/11/1941 Today's Date: 09/23/2015    History of Present Illness pt presents with CHF Exacerbation.  pt with hx of recent admit for the same and left AMA.  pt with hx of NSTEMI, Dementia, CKD, CVA, HTN, COPD, DM, CHF, Chronic Pain, Orhtostatic Hypotension, and BPPV.     Clinical Impression   Pt admitted with above. He demonstrates the below listed deficits and will benefit from continued OT to maximize safety and independence with BADLs.  Pt only agreeable to bed level activity.  He currently requires max A overall for ADLs.  He fatigues quickly.  Discussed need for SNF level rehab with pt and he is agreeable this date.  Will follow acutely.       Follow Up Recommendations  SNF;Supervision/Assistance - 24 hour    Equipment Recommendations  3 in 1 bedside comode    Recommendations for Other Services       Precautions / Restrictions Precautions Precautions: Fall      Mobility Bed Mobility Overal bed mobility: Needs Assistance Bed Mobility: Rolling Rolling: Modified independent (Device/Increase time)         General bed mobility comments: Pt refused moving to EOB   Transfers                 General transfer comment: pt refused     Balance Overall balance assessment: History of Falls                                          ADL Overall ADL's : Needs assistance/impaired Eating/Feeding: Modified independent;Bed level   Grooming: Wash/dry hands;Wash/dry face;Oral care;Brushing hair;Set up;Bed level   Upper Body Bathing: Moderate assistance;Bed level   Lower Body Bathing: Maximal assistance;Bed level   Upper Body Dressing : Total assistance;Bed level   Lower Body Dressing: Total assistance;Bed level   Toilet Transfer: Total assistance Toilet Transfer Details (indicate cue type and reason): pt would not attempt.  Toileting- Clothing Manipulation and  Hygiene: Total assistance;Bed level         General ADL Comments: Pt would only agree to minimal bed level activity      Vision     Perception     Praxis      Pertinent Vitals/Pain Pain Assessment: Faces Faces Pain Scale: Hurts little more Pain Location: generalized  Pain Descriptors / Indicators: Grimacing;Guarding;Moaning Pain Intervention(s): Monitored during session;Limited activity within patient's tolerance     Hand Dominance Right   Extremity/Trunk Assessment Upper Extremity Assessment Upper Extremity Assessment: RUE deficits/detail;LUE deficits/detail;Generalized weakness RUE Deficits / Details: pt with tremor and myoclonic jerks Rt UE  strength grossly 4/5 RUE Coordination: decreased fine motor;decreased gross motor LUE Deficits / Details: myoclonic jerks.  strength grossly 4/5  LUE Coordination: decreased gross motor;decreased fine motor   Lower Extremity Assessment Lower Extremity Assessment: Defer to PT evaluation       Communication Communication Communication: Expressive difficulties (dysarthric )   Cognition Arousal/Alertness: Awake/alert Behavior During Therapy: WFL for tasks assessed/performed Overall Cognitive Status: No family/caregiver present to determine baseline cognitive functioning                     General Comments       Exercises Exercises: Low Level/ICU     Shoulder Instructions      Home Living Family/patient expects to be discharged to::  Private residence Living Arrangements: Alone Available Help at Discharge: Family;Available PRN/intermittently Type of Home: House Home Access: Stairs to enter [employer] of Steps: 8 Entrance Stairs-Rails: Left Home Layout: Two level;Able to live on main level with bedroom/bathroom     Bathroom Shower/Tub: Tub/shower unit;Curtain Shower/tub characteristics: Engineer, building services: Standard     Home Equipment: Cane - single point;Shower seat   Additional  Comments: Pt sates his niece visits him 1x/week and assists with grocery shopping       Prior Functioning/Environment Level of Independence: Independent with assistive device(s)        Comments: Pt reports he uses SPC at home, he endorses falls.  Reports he does not drive and that niece assists him with groceries.  He states has to use scooter in grocery store     OT Diagnosis: Generalized weakness;Cognitive deficits;Acute pain   OT Problem List: Decreased strength;Decreased activity tolerance;Impaired balance (sitting and/or standing);Decreased cognition;Decreased safety awareness;Decreased knowledge of use of DME or AE;Cardiopulmonary status limiting activity;Impaired UE functional use;Pain   OT Treatment/Interventions: Self-care/ADL training;Therapeutic exercise;DME and/or AE instruction;Energy conservation;Therapeutic activities;Cognitive remediation/compensation;Patient/family education;Balance training    OT Goals(Current goals can be found in the care plan section) Acute Rehab OT Goals Patient Stated Goal: did not state OT Goal Formulation: With patient Time For Goal Achievement: 10/07/15 Potential to Achieve Goals: Fair ADL Goals Pt Will Perform Grooming: with min assist;standing Pt Will Transfer to Toilet: with min assist;ambulating;regular height toilet;bedside commode;grab bars Pt Will Perform Toileting - Clothing Manipulation and hygiene: with min assist;sit to/from stand Pt/caregiver will Perform Home Exercise Program: Increased strength;Right Upper extremity;Left upper extremity;With Supervision;With written HEP provided  OT Frequency: Min 2X/week   Barriers to D/C: Decreased caregiver support          Co-evaluation              End of Session Nurse Communication: Mobility status  Activity Tolerance: Patient limited by fatigue Patient left: in bed;with call bell/phone within reach;with bed alarm set   Time: 1728-1740 OT Time Calculation (min): 12  min Charges:  OT General Charges $OT Visit: 1 Procedure OT Evaluation $Initial OT Evaluation Tier I: 1 Procedure G-Codes:    Jeani Hawking M September 25, 2015, 6:10 PM

## 2015-09-23 NOTE — Consult Note (Signed)
Consultation Note Date: 09/23/2015   Patient Name: Russell Hamilton  DOB: 1940-11-27  MRN: [id number]  Age / Sex: 74 y.o., male  PCP: Lance Bosch, NP Referring Physician: Leeanne Rio, MD  Reason for Consultation: Establishing goals of care    Clinical Assessment/Narrative: I met today with Russell Hamilton. We discuss his declining health with progressing heart failure. He understands that this is unfortunately going to get worse instead of better. When we discuss end of life he tells me that he is not concerned about this and expresses that he has faith that God will take him when he is ready - "not before and not later." He goes on to tell me that he does NOT want to be resuscitated. Confirmed with him what this means - DNR confirmed. He expresses concern that his brother would not agree with this decision - I offer to call his family to explain but he does not want me to call his family.   We also discussed how hospice can help with him to be comfortable and happy at home while helping him get the medications he needs to manage his pain (he says he cannot get these and cannot afford them). Reports relief with Lyrica and Percocet. He tells me he is tired and wants a nap - he will think further about the benefit of hospice for him. However, I am unsure if he will be safe to return home as he appears very weak and struggles with even holding his food/drink.   Difficult conversation as Russell Hamilton was somewhat scattered and impulsive; distracted with wanting drink and then spilling drink. Denies suicidal ideation and states he wants to live.   I will follow up Monday. Please call for acute palliative needs over the weekend.    Contacts/Participants in Discussion: Primary Decision Maker: Self for now, legally will fall most likely to his brother   HCPOA: no    SUMMARY OF RECOMMENDATIONS  Code Status/Advance Care  Planning: DNR    Code Status Orders        Start     Ordered   09/23/15 1458  Do not attempt resuscitation (DNR)   Continuous    Question Answer Comment  In the event of cardiac or respiratory ARREST Do not call a "code blue"   In the event of cardiac or respiratory ARREST Do not perform Intubation, CPR, defibrillation or ACLS   In the event of cardiac or respiratory ARREST Use medication by any route, position, wound care, and other measures to relive pain and suffering. May use oxygen, suction and manual treatment of airway obstruction as needed for comfort.      09/23/15 1457       Symptom Management:   Bilateral neuropathic foot pain chronic: Relief with Lyrica and Percocet - continue. He has difficulty obtaining these medications at home.   Palliative Prophylaxis:   Bowel Regimen and Delirium Protocol   Psycho-social/Spiritual:  Support System: Adequate Desire for further Chaplaincy support:no Additional Recommendations: Caregiving  Support/Resources, Education on Hospice and Formal Capacity Evaluation  Prognosis: < 6 months  Discharge Planning: [place] for rehab with Palliative care service follow-up   Chief Complaint/ Primary Diagnoses: Present on Admission:  . Other depression due to general medical condition  I have reviewed the medical record, interviewed the patient and family, and examined the patient. The following aspects are pertinent.  Past Medical History  Diagnosis Date  . Hypertension   . Chronic systolic CHF (congestive heart failure) (  [place])     a. 2D ECHO 08/13/14: EF 40%, diffuse hypokinesis possibly worse in the inferior wall. Mild MR, mild LA dilation, PA pressure 50. Trivial pericardial effusion.  . Acute systolic CHF (congestive heart failure), NYHA class 4 ([place]) 08/12/2014  . Type II diabetes mellitus ([place])   . GERD (gastroesophageal reflux disease)   . Peripheral neuropathy ([place])     Archie Endo 08/12/2014  . Stroke [facility]) 2010     "memory problems since; right leg and left arm weakness since" (02/08/2015)  . Ulcers of both lower extremities ([place])     Archie Endo 02/24/2015  . Noncompliance     Archie Endo 02/24/2015  . COPD (chronic obstructive pulmonary disease) ([place])   . Asthma   . Renal insufficiency    Social History   Social History  . Marital Status: Married    Spouse Name: N/A  . Number of Children: N/A  . Years of Education: N/A   Social History Main Topics  . Smoking status: Current Every Day Smoker -- 0.50 packs/day for 56 years    Types: Cigarettes  . Smokeless tobacco: Never Used  . Alcohol Use: No     Comment: 02/08/2015 "I might have a beer q 6 months"  . Drug Use: Yes    Special: Marijuana     Comment: last marijuana use 08/16/15  . Sexual Activity: Not Currently   Other Topics Concern  . None   Social History Narrative   Family History  Problem Relation Age of Onset  . Kidney disease Mother   . Heart attack Brother     X 2  . Cancer Brother    Scheduled Meds: . aspirin  81 mg Oral Daily  . budesonide-formoterol  2 puff Inhalation BID  . ferrous sulfate  325 mg Oral BID WC  . furosemide  60 mg Oral BID  . heparin  5,000 Units Subcutaneous 3 times per day  . insulin aspart  0-9 Units Subcutaneous TID WC  . pregabalin  100 mg Oral TID  . sodium chloride  3 mL Intravenous Q12H   Continuous Infusions:  PRN Meds:.sodium chloride, acetaminophen, albuterol, ondansetron (ZOFRAN) IV, oxyCODONE-acetaminophen, oxymetazoline, sodium chloride Medications Prior to Admission:  Prior to Admission medications   Medication Sig Start Date End Date Taking? Authorizing Provider  acetaminophen-codeine (TYLENOL #3) 300-30 MG tablet Take 1 tablet by mouth every 8 (eight) hours as needed for moderate pain. 08/17/15  Yes Arnoldo Morale, MD  albuterol (PROVENTIL HFA;VENTOLIN HFA) 108 (90 BASE) MCG/ACT inhaler Inhale 2 puffs into the lungs every 6 (six) hours as needed for wheezing or shortness of breath. 08/17/15   Yes Arnoldo Morale, MD  carvedilol (COREG) 3.125 MG tablet Take 1 tablet (3.125 mg total) by mouth 2 (two) times daily with a meal. 08/17/15  Yes Renella Cunas, MD  ferrous sulfate 325 (65 FE) MG tablet Take 1 tablet (325 mg total) by mouth 2 (two) times daily with a meal. 07/04/15  Yes Arnoldo Morale, MD  Fluticasone Furoate-Vilanterol (BREO ELLIPTA) 100-25 MCG/INH AEPB Inhale 1 puff into the lungs daily. 09/07/15  Yes Arnoldo Morale, MD  furosemide (LASIX) 40 MG tablet Take 1.5 tablets (60 mg total) by mouth 2 (two) times daily. 09/07/15  Yes Arnoldo Morale, MD  gabapentin (NEURONTIN) 300 MG capsule Take 600 mg by mouth 2 (two) times daily.   Yes Historical Provider, MD  omeprazole (PRILOSEC) 20 MG capsule Take 1 capsule (20 mg total) by mouth daily. 09/12/15  Yes Arnoldo Morale, MD  oxyCODONE-acetaminophen (ROXICET) 5-325 MG tablet Take 1 tablet by mouth every 12 (twelve) hours as needed for severe pain. 09/07/15  Yes Arnoldo Morale, MD  aspirin 81 MG chewable tablet Chew 1 tablet (81 mg total) by mouth daily. Patient not taking: Reported on 09/20/2015 06/20/15   Arnoldo Morale, MD  glimepiride (AMARYL) 2 MG tablet Take 1 tablet (2 mg total) by mouth daily with breakfast. Patient not taking: Reported on 09/14/2015 07/04/15   Arnoldo Morale, MD  ipratropium (ATROVENT) 0.02 % nebulizer solution Take 2.5 mLs (0.5 mg total) by nebulization once. Patient not taking: Reported on 09/14/2015 09/07/15   Arnoldo Morale, MD  isosorbide mononitrate (IMDUR) 30 MG 24 hr tablet Take 1 tablet (30 mg total) by mouth daily. Patient not taking: Reported on 08/17/2015 04/19/15   Arnoldo Morale, MD  lisinopril (PRINIVIL,ZESTRIL) 2.5 MG tablet Take 1 tablet (2.5 mg total) by mouth daily. Patient not taking: Reported on 08/17/2015 07/04/15   Arnoldo Morale, MD  pregabalin (LYRICA) 100 MG capsule Take 1 capsule (100 mg total) by mouth 2 (two) times daily. 09/07/15   Arnoldo Morale, MD  silver sulfADIAZINE (SILVADENE) 1 % cream Apply 1 application  topically daily. Patient not taking: Reported on 09/14/2015 06/20/15   Trula Slade, DPM  sitaGLIPtin (JANUVIA) 50 MG tablet Take 1 tablet (50 mg total) by mouth daily. For diabetes Patient not taking: Reported on 09/14/2015 07/04/15   Arnoldo Morale, MD   No Known Allergies  Review of Systems  Respiratory: Positive for cough and shortness of breath.   Neurological: Positive for weakness.       Peripheral neuropathic pain.     Physical Exam  Constitutional: He is oriented to person, place, and time. He appears well-developed.  HENT:  Head: Normocephalic.  Cardiovascular: Normal rate.   Respiratory: Effort normal. No accessory muscle usage. No tachypnea. No respiratory distress.  GI: Soft. Normal appearance. He exhibits no distension.  Neurological: He is alert and oriented to person, place, and time.  Psychiatric: He expresses impulsivity. He expresses no suicidal ideation.    Vital Signs: BP 115/67 mmHg  Pulse 102  Temp(Src) 98.1 F (36.7 C) (Oral)  Resp 24  Ht 5' 9.5" (1.765 m)  Wt 74.072 kg (163 lb 4.8 oz)  BMI 23.78 kg/m2  SpO2 95%  SpO2: SpO2: 95 % O2 Device:SpO2: 95 % O2 Flow Rate: .O2 Flow Rate (L/min): 2 L/min  IO: Intake/output summary:  Intake/Output Summary (Last 24 hours) at 09/23/15 1507 Last data filed at 09/23/15 1030  Gross per 24 hour  Intake    683 ml  Output   1275 ml  Net   -592 ml    LBM: Last BM Date: 09/23/15 Baseline Weight: Weight: 75.297 kg (166 lb) Most recent weight: Weight: 74.072 kg (163 lb 4.8 oz)      Palliative Assessment/Data:    Additional Data Reviewed:  CBC:    Component Value Date/Time   WBC 7.6 09/20/2015 2110   HGB 9.1* 09/20/2015 2110   HCT 27.2* 09/20/2015 2110   PLT 193 09/20/2015 2110   MCV 98.2 09/20/2015 2110   NEUTROABS 2.1 09/20/2015 2110   LYMPHSABS 1.4 09/20/2015 2110   MONOABS 4.1* 09/20/2015 2110   EOSABS 0.0 09/20/2015 2110   BASOSABS 0.0 09/20/2015 2110   Comprehensive Metabolic Panel:      Component Value Date/Time   NA 130* 09/23/2015 0239   K 4.0 09/23/2015 0239   CL 89* 09/23/2015 0239   CO2 32 09/23/2015 0239   BUN  29* 09/23/2015 0239   CREATININE 1.62* 09/23/2015 0239   CREATININE 0.99 06/20/2015 1452   GLUCOSE 197* 09/23/2015 0239   CALCIUM 7.9* 09/23/2015 0239   AST 17 12/11/2014 1909   ALT 11 12/11/2014 1909   ALKPHOS 140* 12/11/2014 1909   BILITOT 1.0 12/11/2014 1909   PROT 7.6 12/11/2014 1909   ALBUMIN 3.4* 12/11/2014 1909     Time In: 1400 Time Out: 1500 Time Total: 71mn Greater than 50%  of this time was spent counseling and coordinating care related to the above assessment and plan.  Signed by: PPershing Proud NP  APershing Proud NP  191/79/1505 3:07 PM  Please contact Palliative Medicine Team phone at [phone] questions and concerns.

## 2015-09-23 NOTE — Care Management Important Message (Signed)
Important Message  Patient Details  Name: Russell Hamilton MRN: [id number] Date of Birth: 04/05/1941   Medicare Important Message Given:  Yes    Adrianna Dudas P Lylee Corrow 09/23/2015, 1:08 PM

## 2015-09-23 NOTE — Progress Notes (Signed)
PT Cancellation Note  Patient Details Name: Russell Hamilton MRN: [id number] DOB: 12/03/1940   Cancelled Treatment:    Reason Eval/Treat Not Completed: Patient declined, no reason specified.  Informed nursing of same.  Will try again next week.   Ivar DrapeStout, Jager Koska E 09/23/2015, 4:53 PM   Samul Dadauth Kendre Sires, PT MS Acute Rehab Dept. Number: [facility] [phone] and [facility] [phone]

## 2015-09-23 NOTE — Progress Notes (Signed)
EEG Completed; Results Pending  

## 2015-09-23 NOTE — Progress Notes (Signed)
Patient Name: Russell Hamilton Date of Encounter: 09/23/2015  Principal Problem:   Other depression due to general medical condition Active Problems:   CHF exacerbation (HCC)   Acute on chronic congestive heart failure (HCC)   Chronic bronchitis (HCC)   Secondary hypertension, unspecified   Uncontrolled type 2 diabetes mellitus with ketoacidosis without coma, without long-term current use of insulin (HCC)   Personal history of noncompliance with medical treatment   Suicidal ideation   Length of Stay: 3  SUBJECTIVE  Has not seen this patient since last admission, previously he was noncompliant, but today he appears to be too weak even for talking. He apparently had an episode of seizure this morning.   CURRENT MEDS . aspirin  81 mg Oral Daily  . budesonide-formoterol  2 puff Inhalation BID  . ferrous sulfate  325 mg Oral BID WC  . furosemide  60 mg Oral BID  . heparin  5,000 Units Subcutaneous 3 times per day  . insulin aspart  0-9 Units Subcutaneous TID WC  . isosorbide mononitrate  30 mg Oral Daily  . lisinopril  2.5 mg Oral Daily  . metoprolol tartrate  12.5 mg Oral BID  . pregabalin  100 mg Oral TID  . sodium chloride  3 mL Intravenous Q12H   OBJECTIVE  Filed Vitals:   09/22/15 1345 09/22/15 2006 09/23/15 0724 09/23/15 0930  BP: 91/57 91/55 129/67 90/51  Pulse: 95 101 107 104  Temp: 97.8 F (36.6 C) 98.1 F (36.7 C)    TempSrc: Oral Oral Oral   Resp:  22 22 24   Height:      Weight:      SpO2: 91% 100% 92% 90%    Intake/Output Summary (Last 24 hours) at 09/23/15 0953 Last data filed at 09/23/15 0901  Gross per 24 hour  Intake    920 ml  Output   1275 ml  Net   -355 ml   Filed Weights   09/20/15 2337 09/21/15 0542 09/22/15 0518  Weight: 164 lb 14.4 oz (74.798 kg) 161 lb 9.6 oz (73.3 kg) 163 lb 4.8 oz (74.072 kg)    PHYSICAL EXAM  General: Pleasant, NAD. Neuro: Alert and oriented X 3. Moves all extremities spontaneously. Psych: Normal affect. HEENT:   Normal  Neck: Supple without bruits or JVD. Lungs:  Resp regular. Appears to be labored. Diminished breath sound, but no obvious wheezing or rale.  Heart: RRR no s3, s4, or murmurs. Abdomen: Soft, non-tender, non-distended, BS + x 4.  Extremities: No clubbing, cyanosis or edema. DP/PT/Radials 2+ and equal bilaterally.  Accessory Clinical Findings  CBC  Recent Labs  09/20/15 2110  WBC 7.6  NEUTROABS 2.1  HGB 9.1*  HCT 27.2*  MCV 98.2  PLT 193   Basic Metabolic Panel  Recent Labs  09/22/15 0550 09/23/15 0239  NA 129* 130*  K 3.8 4.0  CL 89* 89*  CO2 31 32  GLUCOSE 253* 197*  BUN 25* 29*  CREATININE 1.40* 1.62*  CALCIUM 7.7* 7.9*    Recent Labs  09/21/15 0656 09/21/15 1214 09/21/15 1908  TROPONINI 0.11* 0.12* 0.12*   Radiology/Studies  Ct Angio Chest Pe W/cm &/or Wo Cm  09/14/2015  CLINICAL DATA:  Shortness of breath, hypoxia, and cough. IMPRESSION: 1. No pulmonary embolus. 2. Congestive heart failure with pulmonary edema and moderate pleural effusions. 3. Small to moderate pericardial effusion. Electronically Signed   By: Rubye OaksMelanie  Ehinger M.D.   On: 09/14/2015 21:22   TELE: SR  ASSESSMENT AND PLAN  1. Acute on Chronic Systolic CHF  - last echo on 07/13/2015 showed EF of 10-15%, greatly reduced from 40% in 08/2014.  - was admitted for CHF exacerbation from 09/14/2015 - 09/16/2015 but left AMA and has been non-compliant with his medications since. Was noncompliant with his medications prior to that hospitalization as well.   - does not weight himself daily. Baseline weight likely in low to mid 150's. 166lbs at time of admission. 163 this am. Continue to obtain daily weights while admitted.  - Cr trended up from 1.2 to 1.6 now, converted to PO lasix today after morning IV dose. Patient appears to be severely dyspneic despite lung generally clear. Last time i saw him during last admission, he was not as weak as this. Given EF 10-15%, rising suspicion for  hypoperfusion syndrome. Will see if BP come up after stopping all BP med. If BP remain low and renal function continue to decline, then he is probably hypoperfusing, unfortunately with his h/o noncompliance, he would be very unlikely to qualify for advanced heart failure management. Consider palliative care/hospice if his condition does not improve.   - probably good to transfer to stepdown or ICU given his decline, he is at high risk for sudden decompensation/cardiac catastrophe  2. HTN with hypotension now  - stop all BP medication, his BP has went from 100-140s to now 80-90s.   3. Stage 3 CKD - baseline of 1.1 -1.2 - Cr 1.6 now  4. Medication Noncompliance - importance of this expressed to the patient.  5. Possible seizure: - witnessed episode this morning, apparently was foaming in the mouth   Signed, Azalee Course PA Pager: [phone]   The patient was seen, examined and discussed with Azalee Course, PA-C and I agree with the above.   74 year old patient with acute on chronic systolic CHF, end stage, with frequent readmission due to non-compliance, seizure activity, acute on chronic kidney failure, malnutrition.  He continues to be fluid overloaded with worsening kidney failure. At this point I think that we should ask palliative care for a consult. Continue iv lasix for now.   Lars Masson 09/23/2015

## 2015-09-23 NOTE — Progress Notes (Signed)
Family Medicine Teaching Service Daily Progress Note Intern Pager: [phone]  Patient name: Russell Hamilton Medical record number: [id number] Date of birth: 03/07/1941 Age: 74 y.o. Gender: male  Primary Care Provider: Ambrose FinlandValerie A Keck, NP Consultants: Heart Failure, Psychiatry Code Status: Partial- would like intubation but not chest compressions  Pt Overview and Major Events to Date:  11/15: Admitted to the FPTS with SOB, likely CHF exacerbation  Assessment and Plan: Russell MathJames Rembold is a 74 y.o. male presenting with cough and shortness of breath. 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  Possible seizure: Pt was noted to have seizure-like activity by a nurse this morning. He was frothing at the mouth and was unresponsive. His legs were moving side to side and his arms were moving up and down. The episode lasted 20 seconds. He appeared confused after and was not making sense. On exam, he had 5/5 muscle strength in his upper and lower extremities, but his right upper extremity appeared uncoordinated and was trembling as he was trying to eat. He states this is not normal for him. - Will consider talking with Neuro today - Monitor closely.  Acute systolic CHF Exacerbation. Last ECHO 07/2015: EF 10-15%, diffuse hypokinesis, moderate LV dilation, moderately impaired RV function. BNP 1284 (runs around 1500-1800). On exam, he does not have any crackles or pitting edema. Weight is 164lb on admission > 163 this yesterday. (dry weight is ~150lbs). Weight was not measured this morning. UOP 1.4L in the last 24 hours, net -3.7L since admission. O2 sats ranging from 90-100% on 2L O2 by nasal cannula. Creatinine bumped from 1.16 on admission to 1.62 this morning. Baseline 1.1-1.2. - Diurese with Lasix 60mg  IV bid. Will switch to PO Lasix 60mg  bid today, as Pt is having low BPs and a bump in his creatinine.  - Heart  failure team consult. Continue with diuresis.  - He refused his ECHO. - Trend troponins: 0.08 > 0.11 > 0.12 > 0.12 - Held PM Lopressor 12.5mg  dose yesterday because of BPs in the 90s/50s. Will continue this morning, as BPs have improved to 129/67. - Wean O2 as tolerated. Currently on 1L O2. - SW consult for help with medication non-compliance. - Case management consult for medication assistance, as Pt states he cannot afford his medications. He told them that he has no problems getting his medications and stated there was nothing they could do for them. CM has signed off. - Daily weights, strict I/O's - Cardiac monitoring - PT recommending SNF. - Pt refused OT consult.  Hypotension, improved: Pt has hypertension at baseline, but BPs have ranged from 82-91/49-57 over the last 24 hours, likely secondary to diuresis.  - PM Lopressor dose was held yesterday. Will continue home meds: Lisinopril 2.5mg  qd.  - Will monitor BPs closely.  Suicidal ideations, resolved: Pt stated he wanted to shoot himself in the head with a gun on admission. No SI this morning. - Psych consult, appreciate their recommendations. No active SI. Do not recommend psychiatric meds at this time. They could not contact family members because Pt refused consent. Based on brief evaluation, appears to capacity to make his own medical decisions. Psych signing off. - Suicide precautions have been discontinued.  Hyponatremia: Na stable at 130 this morning. Took in 1.1L PO, but unsure if this is accurately measured. Likely secondary to excess water intake. - Continue fluid restriction. - Monitor with daily BMETs.  COPD: No wheezes or rhonchi on exam. No increased sputum. Saturations ranging from 90-100% on 2L O2 by nasal cannula. Have weaned to 1L this morning. - Continue home meds: Albuterol nebulizer and Symbicort - Wean O2 as tolerated.  T2DM with peripheral neuropathy: Last HgbA1c (07/13/15): 8.2%. CBGs ranging from 160-245 in  the last 24 hours. - Sensitive SSI - Lyrica  tid  CKD III: Cr 1.16 on admission > 1.62 today. Baseline 1-1.2. Bump is likely due to diuresis. - Monitor with daily BMETs - Avoid nephrotoxic agents  Normocytic anemia: Likely anemia of chronic disease, although ferritin in 02/2015 was low-normal (32). Hgb 9.1 on admission. - Continue home med: ferrous sulfate  bid - Will continue to monitor   Lewy Body Dementia: At this point, we have some concerns about his ability to make his own medical decisions. - Will continue to monitor. - Psych consult. From their brief encounter, believe he has decision-making capacity. They wanted to speak with his family members to get more information on his baseline mental status, but he would not give consent.  FEN/GI: Heart healthy diet, SLIV Prophylaxis: Heparin sq  Disposition: Discharge home, pending adequate diuresis. Would like to have a good plan in place as far as medication assistance and education on the importance of medication compliance before discharge.  Subjective:  Pt was noted to have seizure-like activity by a nurse this morning. He was frothing at the mouth and was unresponsive. His legs were moving side to side and his arms were moving up and down. The episode lasted 20 seconds. He appeared confused after. When I saw him on exam, he had 5/5 muscle strength in his upper and lower extremities, but his right upper extremity appeared uncoordinated and was trembling as he was trying to eat. He states this is not normal for him. He denies any SI this morning.  Objective: Temp:  [97.8 F (36.6 C)-98.1 F (36.7 C)] 98.1 F (36.7 C) (11/17 2006) Pulse Rate:  [90-101] 101 (11/17 2006) Resp:  [22] 22 (11/17 2006) BP: (82-91)/(49-57) 91/55 mmHg (11/17 2006) SpO2:  [90 %-100 %] 100 % (11/17 2006) Physical Exam: General: Patient sitting up in bed eating breakfast, in NAD. Eyes: No scleral icterus, EOMI ENTM: moist mucous membranes   Neck: Supple  Cardiovascular: RRR, no murmurs, rubs, gallops Respiratory: No crackles auscultated this morning, no wheezes or rhonchi, normal work of breathing, [place] in place. Abdomen: BS+, non-tender, non-distended MSK: Exquisitely tender to palpation of his ankles/feet, no pitting edema. Neuro: Awake, alert, oriented x 3; 5/5 muscle strength in upper and lower extremities bilaterally, right hand appears uncoordinated and is tremulous.  Skin: Feet appear dry, but no lesions or rashes, multiple excoriations present on back. Psych: Answers questions appropriately.  Laboratory:  Recent Labs Lab 09/20/15 2110  WBC 7.6  HGB 9.1*  HCT 27.2*  PLT 193    Recent Labs Lab 09/21/15 0656 09/22/15 0550 09/23/15 0239  NA 131* 129* 130*  K 3.9 3.8 4.0  CL 90* 89* 89*  CO2 33* 31 32  BUN 10 25* 29*  CREATININE 1.29* 1.40* 1.62*  CALCIUM 8.4* 7.7* 7.9*  GLUCOSE 315* 253* 197*   Troponin: 0.08 BNP: 1284 ABG: pH 7.54, pCO2 35.2, pO2 52  Imaging/Diagnostic Tests: CXR: Cardiomegaly with slight worsening of bilateral edema/infiltrates, persistent laying effusions.  Campbell Stall, MD 09/23/2015, 6:33 AM PGY-1, [place] [facility] FPTS Intern pager: [phone], text pages welcome

## 2015-09-23 NOTE — Progress Notes (Signed)
PT Cancellation Note  Patient Details Name: Russell Hamilton MRN: [id number] DOB: 01/17/1941   Cancelled Treatment:    Reason Eval/Treat Not Completed: Patient at procedure or test/unavailable.  Will check back as time allows.   Ivar DrapeStout, Aidyn Kellis E 09/23/2015, 12:56 PM   Samul Dadauth Evonne Rinks, PT MS Acute Rehab Dept. Number: [phone] [id number] and [facility] [phone]

## 2015-09-23 NOTE — Procedures (Signed)
ELECTROENCEPHALOGRAM REPORT  Date of Study: 09/23/2015  Patient's Name: Russell Hamilton MRN: [id number] Date of Birth: [birth year]  Referring Provider: Dr. Levert FeinsteinBrittany McIntyre  Clinical History: This is a 74 year old man with note of seizure-like activity this morning. He was frothing at the mouth and was unresponsive. His legs were moving side to side and his arms were moving up and down. The episode lasted 20 seconds. He appeared confused after and was not making sense.   Medications: pregabalin (LYRICA) capsule 100 mg acetaminophen (TYLENOL) tablet 650 mg albuterol (PROVENTIL) (2.5 MG/3ML) 0.083% nebulizer solution 2.5 mg aspirin chewable tablet 81 mg budesonide-formoterol (SYMBICORT) 160-4.5 MCG/ACT inhaler 2 puff ferrous sulfate tablet 325 mg furosemide (LASIX) tablet 60 mg heparin injection 5,000 Units insulin aspart (novoLOG) injection 0-9 Units ondansetron (ZOFRAN) injection 4 mg oxyCODONE-acetaminophen (PERCOCET/ROXICET) 5-325 MG per tablet 1 tablet oxymetazoline (AFRIN) 0.05 % nasal spray 1 spray  Technical Summary: A multichannel digital EEG recording measured by the international 10-20 system with electrodes applied with paste and impedances below 5000 ohms performed in our laboratory with EKG monitoring in an awake and asleep patient.  Hyperventilation and photic stimulation were not performed.  The digital EEG was referentially recorded, reformatted, and digitally filtered in a variety of bipolar and referential montages for optimal display.    Description: The patient is awake and asleep during the recording. He is noted to be confused and easily agitated.  During maximal wakefulness, there is a symmetric, medium voltage 9-10 Hz posterior dominant rhythm that attenuates with eye opening.  The record is symmetric.  During drowsiness and stage I sleep, there is an increase in theta slowing of the background with occasional vertex waves seen.  Hyperventilation and photic stimulation  were not performed.  Patient noted to make rhythmic foot movements with no associated EEG change. There were no epileptiform discharges or electrographic seizures seen.    EKG lead was unremarkable.  Impression: This awake and asleep EEG is normal.    Clinical Correlation: A normal EEG does not exclude a clinical diagnosis of epilepsy. Clinical correlation is advised.   Patrcia DollyKaren Aquino, M.D.

## 2015-09-23 NOTE — Progress Notes (Signed)
Pt had a questionable seizure for about 30 seconds, after coming too, pupils and neuro intact and WNL, MD notified, will continue to monitor, Thanks Lavonda JumboMike F RN

## 2015-09-23 NOTE — Progress Notes (Signed)
Inpatient Diabetes Program Recommendations  AACE/ADA: New Consensus Statement on Inpatient Glycemic Control (2015)  Target Ranges:  Prepandial:   less than 140 mg/dL      Peak postprandial:   less than 180 mg/dL (1-2 hours)      Critically ill patients:  140 - 180 mg/dL   Review of Glycemic Control  Diabetes history: DM 2 Outpatient Diabetes medications: Pt reported not taking Januvia + Amaryl at home Current orders for Inpatient glycemic control: Novolog Sensitive TID  Inpatient Diabetes Program Recommendations: Insulin - Basal: Due to patient's glucose being in the 200's, please consider starting low dose basal insulin while inpatient, Levemir 10 units Q24hrs.   Thanks,  Christena DeemShannon Akya Fiorello RN, MSN, [facility] Inpatient Diabetes Coordinator Team Pager [phone] (8a-5p)

## 2015-09-24 ENCOUNTER — Inpatient Hospital Stay (HOSPITAL_COMMUNITY): Payer: Medicare Other

## 2015-09-24 LAB — GLUCOSE, CAPILLARY
GLUCOSE-CAPILLARY: 166 mg/dL — AB (ref 65–99)
Glucose-Capillary: 178 mg/dL — ABNORMAL HIGH (ref 65–99)
Glucose-Capillary: 197 mg/dL — ABNORMAL HIGH (ref 65–99)
Glucose-Capillary: 267 mg/dL — ABNORMAL HIGH (ref 65–99)

## 2015-09-24 MED ORDER — IBUPROFEN 600 MG PO TABS
600.0000 mg | ORAL_TABLET | Freq: Once | ORAL | Status: AC
  Administered 2015-09-24: 600 mg via ORAL
  Filled 2015-09-24: qty 1

## 2015-09-24 NOTE — Progress Notes (Signed)
Family Medicine Teaching Service Daily Progress Note Intern Pager: [phone]  Patient name: Russell Hamilton Medical record number: [id number] Date of birth: June 14, 1941 Age: 74 y.o. Gender: male  Primary Care Provider: Ambrose Finland, NP Consultants: Heart Failure, Psychiatry, Palliative Care Code Status: DNR  Pt Overview and Major Events to Date:  11/15: Admitted to the FPTS with SOB, likely CHF exacerbation 11/18: Had a witness seizure by nursing staff- MRI and EEG were normal.  Assessment and Plan: Russell Hamilton is a 73 y.o. male presenting with cough and shortness of breath. 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  Acute systolic CHF Exacerbation. Last ECHO 07/2015: EF 10-15%, diffuse hypokinesis, moderate LV dilation, moderately impaired RV function. On exam, he does not have any crackles or pitting edema. Weight is 164lb on admission > 158lb this morning (dry weight is ~150lbs). UOP 935cc's in the last 24 hours, net -4.2L since admission. O2 sats ranging from 90-95% on 2L O2 by nasal cannula. Creatinine bumped from 1.16 on admission to 1.62 yesterday morning. Baseline 1.1-1.2. - Continued diuresis with Lasix  PO bid. - Heart failure team consult. Continue with diuresis. Consider Palliative care and transfer to stepdown unit or ICU. - He refused his ECHO. - Holding home BP meds in the setting of hypotension. - Wean O2 as tolerated.  - Daily weights, strict I/O's - Cardiac monitoring - PT recommending SNF and 3 in 1 bedside commode. - Pt refused OT consult. - Palliative care was consulted yesterday. He has been changed to full DNR. He was interested in Hospice care and would like to think about. Palliative care will follow-up on Monday if he is still here. - This morning, Pt stated he would like to move forward with Hospice at this time.  Possible seizure, resolved: Pt was noted to  have seizure-like activity on the morning of 11/18. On exam, he is alert and oriented, no focal deficits. - MRI brain: No acute abnormalities - EEG: normal. - Monitor closely.  Right elbow pain: Began complaining of right elbow pain starting yesterday evening. On exam, his elbow is edematous. ROM is limited by pain. He has been afebrile overnight. Differentials include olecranon bursitis, gout or pseudogout, septic arthritis (less likely, as Pt has been afebrile and feels okay overall), fracture. - Tylenol PRN for pain. - Will get x-ray of right elbow today.  Hypotension, improved: Pt has hypertension at baseline, but BPs were as low as 90/51 overnight. Likely secondary to diuresis and end stage HFrEF.  - Holding home BP meds (Lopressor and Lisinopril) - Will monitor BPs closely.  Hyponatremia, chronic, stable: Na stable at 130 yesterday. Took in 440 cc's PO, but unsure if this is accurately measured. Likely secondary to excess water intake. - Continue fluid restriction. - Monitor with daily BMETs.  COPD: No wheezes or rhonchi on exam. No increased sputum. Saturations ranging from 90-100% on 2L O2 by nasal cannula. Have weaned to 1L this morning. - Continue home meds: Albuterol nebulizer and Symbicort - Wean O2 as tolerated.  T2DM with peripheral neuropathy: Last HgbA1c (07/13/15): 8.2%. CBGs ranging from 204-264 in the last 24 hours. - Sensitive SSI - Lyrica  tid  CKD III: Cr 1.16 on admission > 1.62 yesterday. BMET from this am is pending. Baseline 1-1.2. Bump is likely due to diuresis vs hypoperfusion in the setting of end stage HFrEF. - Monitor with daily BMETs - Avoid nephrotoxic  agents  Normocytic anemia: Likely anemia of chronic disease, although ferritin in 02/2015 was low-normal (32). Hgb 9.1 on admission. - Continue home med: ferrous sulfate 325mg  bid - Will continue to monitor   Lewy Body Dementia: At this point, we have some concerns about his ability to make his own  medical decisions. - Will continue to monitor. - Psych consult. From their brief encounter, believe he has decision-making capacity. They wanted to speak with his family members to get more information on his baseline mental status, but he would not give consent.  FEN/GI: Heart healthy diet, SLIV Prophylaxis: Heparin sq  Disposition: Home with Hospice.  Subjective:  Pt states he is having a lot of pain in his right elbow this morning. He states the pain started 2 weeks ago, but worsened yesterday afternoon. He denies any trauma to the area. He states the pain has been constant. He endorses shortness of breath this morning. I spoke with him about discharge planning. He stated he would like to go forward with palliative care at this time. He is interested in being at home as long as possible and having good pain control.   Objective: Temp:  [97.5 F (36.4 C)-98.1 F (36.7 C)] 98.1 F (36.7 C) (11/18 2100) Pulse Rate:  [88-107] 100 (11/18 2100) Resp:  [22-24] 22 (11/18 2100) BP: (90-129)/(51-67) 104/62 mmHg (11/18 2100) SpO2:  [90 %-95 %] 93 % (11/18 2100) Weight:  [158 lb 8.2 oz (71.9 kg)] 158 lb 8.2 oz (71.9 kg) (11/19 0500) Physical Exam: General: Laying in bed, occasionally wincing in pain with movement of his right arm. Eyes: No scleral icterus, EOMI ENTM: moist mucous membranes  Neck: Supple  Cardiovascular: RRR, no murmurs, rubs, gallops Respiratory: CTAB, no wheezes or crackles, [place] in place. Abdomen: BS+, non-tender, non-distended MSK: Exquisitely tender to palpation of his ankles/feet, no pitting edema; right elbow is edematous and very tender to palpation. Right elbow with limited ROM due to pain. Neuro: Awake, alert, oriented x 3; CN 2-12 grossly intact. Skin: Feet appear dry, but no lesions or rashes, multiple excoriations present on back. Psych: Answers questions appropriately.  Laboratory:  Recent Labs Lab 09/20/15 2110  WBC 7.6  HGB 9.1*  HCT 27.2*  PLT 193     Recent Labs Lab 09/21/15 0656 09/22/15 0550 09/23/15 0239  NA 131* 129* 130*  K 3.9 3.8 4.0  CL 90* 89* 89*  CO2 33* 31 32  BUN 10 25* 29*  CREATININE 1.29* 1.40* 1.62*  CALCIUM 8.4* 7.7* 7.9*  GLUCOSE 315* 253* 197*   Troponin: 0.08 BNP: 1284 ABG: pH 7.54, pCO2 35.2, pO2 52  Imaging/Diagnostic Tests: CXR: Cardiomegaly with slight worsening of bilateral edema/infiltrates, persistent laying effusions.  Campbell StallKaty Dodd Renell Coaxum, MD 09/24/2015, 6:56 AM PGY-1, [place] [facility] FPTS Intern pager: [phone], text pages welcome

## 2015-09-24 NOTE — Progress Notes (Signed)
Patient Name: Nat MathJames Vilchis Date of Encounter: 09/24/2015  Principal Problem:   Other depression due to general medical condition Active Problems:   CHF exacerbation (HCC)   Acute on chronic congestive heart failure (HCC)   Chronic bronchitis (HCC)   Secondary hypertension, unspecified   Uncontrolled type 2 diabetes mellitus with ketoacidosis without coma, without long-term current use of insulin (HCC)   Personal history of noncompliance with medical treatment   Suicidal ideation   Observed seizure-like activity (HCC)   Length of Stay: 4  SUBJECTIVE  Pt has a long hx of CHF and noncompliance.  Palliative care has been consulted.    CURRENT MEDS . aspirin  81 mg Oral Daily  . budesonide-formoterol  2 puff Inhalation BID  . ferrous sulfate  325 mg Oral BID WC  . furosemide  60 mg Oral BID  . heparin  5,000 Units Subcutaneous 3 times per day  . insulin aspart  0-9 Units Subcutaneous TID WC  . pregabalin  100 mg Oral TID  . sodium chloride  3 mL Intravenous Q12H   OBJECTIVE  Filed Vitals:   09/23/15 1031 09/23/15 1607 09/23/15 2100 09/24/15 0500  BP: 115/67 96/61 104/62   Pulse: 102 88 100   Temp:  97.5 F (36.4 C) 98.1 F (36.7 C)   TempSrc:  Oral Oral   Resp: 24 24 22    Height:      Weight:    158 lb 8.2 oz (71.9 kg)  SpO2: 95% 91% 93%     Intake/Output Summary (Last 24 hours) at 09/24/15 1110 Last data filed at 09/24/15 0900  Gross per 24 hour  Intake    220 ml  Output    985 ml  Net   -765 ml   Filed Weights   09/21/15 0542 09/22/15 0518 09/24/15 0500  Weight: 161 lb 9.6 oz (73.3 kg) 163 lb 4.8 oz (74.072 kg) 158 lb 8.2 oz (71.9 kg)    PHYSICAL EXAM  General:  Chronically ill appearing  Neuro:   Moves all extremities spontaneously. Psych: Normal affect. HEENT:  Normal  Neck: Supple without bruits or JVD. Lungs:  Resp regular.  Heart: RRR no s3, s4, or murmurs. Abdomen: Soft, non-tender, non-distended, BS + x 4.  Extremities: No clubbing,  cyanosis or edema. DP/PT/Radials 2+ and equal bilaterally.  Accessory Clinical Findings  CBC No results for input(s): WBC, NEUTROABS, HGB, HCT, MCV, PLT in the last 72 hours. Basic Metabolic Panel  Recent Labs  09/22/15 0550 09/23/15 0239  NA 129* 130*  K 3.8 4.0  CL 89* 89*  CO2 31 32  GLUCOSE 253* 197*  BUN 25* 29*  CREATININE 1.40* 1.62*  CALCIUM 7.7* 7.9*    Recent Labs  09/21/15 1214 09/21/15 1908  TROPONINI 0.12* 0.12*   Radiology/Studies  Ct Angio Chest Pe W/cm &/or Wo Cm  09/14/2015  CLINICAL DATA:  Shortness of breath, hypoxia, and cough. IMPRESSION: 1. No pulmonary embolus. 2. Congestive heart failure with pulmonary edema and moderate pleural effusions. 3. Small to moderate pericardial effusion. Electronically Signed   By: Rubye OaksMelanie  Ehinger M.D.   On: 09/14/2015 21:22   TELE: SR   ASSESSMENT AND PLAN  1. Acute on Chronic Systolic CHF  - last echo on 07/13/2015 showed EF of 10-15%, greatly reduced from 40% in 08/2014.  - was admitted for CHF exacerbation from 09/14/2015 - 09/16/2015 but left AMA and has been non-compliant with his medications since. Was noncompliant with his medications prior to that hospitalization  as well.   - does not weight himself daily. Baseline weight likely in low to mid 150's. 166lbs at time of admission. 163 this am. Continue to obtain daily weights while admitted.   Palliative care is now involved.   2. HTN:   meds are currently on hold.  Prognosis is poor  Palliative care is now consulting .  Will sign off. Call for questions    Nahser, Deloris Ping, MD  09/24/2015 11:13 AM    [facility] [address] [place],  [address] [place], [place]  [address] Pager [phone] Phone: [phone]; Fax: [phone]   [facility]  [address] [place], [place]  [address] [phone]   Fax [phone]

## 2015-09-25 LAB — BASIC METABOLIC PANEL
ANION GAP: 8 (ref 5–15)
BUN: 38 mg/dL — ABNORMAL HIGH (ref 6–20)
CO2: 31 mmol/L (ref 22–32)
Calcium: 8.1 mg/dL — ABNORMAL LOW (ref 8.9–10.3)
Chloride: 92 mmol/L — ABNORMAL LOW (ref 101–111)
Creatinine, Ser: 1.75 mg/dL — ABNORMAL HIGH (ref 0.61–1.24)
GFR, EST AFRICAN AMERICAN: 43 mL/min — AB (ref >60)
GFR, EST NON AFRICAN AMERICAN: 37 mL/min — AB (ref >60)
GLUCOSE: 370 mg/dL — AB (ref 65–99)
POTASSIUM: 3.6 mmol/L (ref 3.5–5.1)
Sodium: 131 mmol/L — ABNORMAL LOW (ref 135–145)

## 2015-09-25 LAB — GLUCOSE, CAPILLARY
GLUCOSE-CAPILLARY: 377 mg/dL — AB (ref 65–99)
GLUCOSE-CAPILLARY: 263 mg/dL — AB (ref 65–99)
GLUCOSE-CAPILLARY: 189 mg/dL — AB (ref 65–99)
Glucose-Capillary: 140 mg/dL — ABNORMAL HIGH (ref 65–99)

## 2015-09-25 LAB — URIC ACID: Uric Acid, Serum: 8.8 mg/dL — ABNORMAL HIGH (ref 4.4–7.6)

## 2015-09-25 MED ORDER — INSULIN ASPART 100 UNIT/ML ~~LOC~~ SOLN
0.0000 [IU] | Freq: Three times a day (TID) | SUBCUTANEOUS | Status: DC
  Administered 2015-09-25: 8 [IU] via SUBCUTANEOUS
  Administered 2015-09-25: 2 [IU] via SUBCUTANEOUS
  Administered 2015-09-26: 8 [IU] via SUBCUTANEOUS
  Administered 2015-09-26: 3 [IU] via SUBCUTANEOUS
  Administered 2015-09-26: 5 [IU] via SUBCUTANEOUS
  Administered 2015-09-27: 2 [IU] via SUBCUTANEOUS
  Administered 2015-09-27 (×2): 3 [IU] via SUBCUTANEOUS

## 2015-09-25 NOTE — Progress Notes (Signed)
Family Medicine Teaching Service Daily Progress Note Intern Pager: [phone]  Patient name: Russell Hamilton Medical record number: [id number] Date of birth: 01/21/1941 Age: 74 y.o. Gender: male  Primary Care Provider: Ambrose FinlandValerie A Keck, NP Consultants: Heart Failure, Psychiatry, Palliative Care Code Status: DNR  Pt Overview and Major Events to Date:  11/15: Admitted to the FPTS with SOB, likely CHF exacerbation 11/18: Had a witnessed seizure by nursing staff- MRI and EEG were normal.  Assessment and Plan: Russell MathJames Bernat is a 74 y.o. male presenting with cough and shortness of breath. 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  Acute systolic CHF Exacerbation. Last ECHO 07/2015: EF 10-15%, diffuse hypokinesis, moderate LV dilation, moderately impaired RV function. On exam, he does not signs of fluid overload. Weight is 164lb on admission > 161lb this morning (dry weight is ~150lbs). Good UOP, net -3.5L since admission. On 2L O2 by nasal cannula; not on O2 at home. - Continued diuresis with Lasix 60mg  PO bid (home dose) - Heart failure team signed off. Continue with diuresis. - agreed with palliative consult - He refused his ECHO this admission - Holding home BP meds in the setting of hypotension. - Wean O2 as tolerated.  - Daily weights, strict I/O's - Cardiac monitoring - PT recommending SNF - Pt refused OT consult. - Palliative care consulted. DNR and considering hospice. Prognosis poor. - this morning was unsure of hospice and DNR code(unsure if underlying dementia causing confusion). Will likely need family meeting with Niece(Tonya) who helps care for him. -noncompliant with medications and therapy  Possible seizure, resolved: Pt was noted to have seizure-like activity on the morning of 11/18. On exam, he is alert and oriented x2, no focal deficits. - MRI brain: No acute abnormalities - EEG:  normal. - Monitor  Right elbow pain: Not complaining of elbow pain this morning. On exam, his elbow is edematous. ROM is limited by pain. He has been afebrile. Most likely a occult fracture from possible trauma. Radiologic imaging consistent with this.  - Tylenol PRN for pain. - x-ray showed joint effusion with occult fx - Uric acid level elevated at 8.8 - will hold off on treatment at this time; does not seem like gout flare - sling ordered  Hypotension, improved: Pt has hypertension at baseline, but BPs have been soft here. Likely secondary to diuresis and end stage HFrEF.   - Holding home BP meds (Lopressor and Lisinopril) - Will monitor BPs closely.  Hyponatremia, chronic, stable: Na stable. Likely secondary to excess water intake. - Continue fluid restriction. - Monitor with daily BMETs.  COPD: No wheezes or rhonchi on exam. No increased sputum. Saturations ranging from 95-100% on 2L O2 by nasal cannula. On 2L O2 this morning. Stable.  - Continue home meds: Albuterol nebulizer and Symbicort - Wean O2 as tolerated.  T2DM with peripheral neuropathy: Last HgbA1c (07/13/15): 8.2%. CBGs ranging from 200-380 in the last 24 hours. - changed to moderate SSI - Lyrica 100mg  tid  CKD III: Cr 1.16 on admission > 1.75 today. Baseline 1-1.2. Bump is likely due to diuresis vs hypoperfusion in the setting of end stage HFrEF and soft BPs. - Monitor with daily BMETs - Avoid nephrotoxic agents   Normocytic anemia: Likely anemia of chronic disease, although ferritin in 02/2015 was low-normal (32). Hgb 9.1 on admission. - Continue home med: ferrous sulfate 325mg  bid - Will continue to monitor  Lewy Body Dementia: At this point, we have some concerns about his ability to make his own medical decisions. - Will continue to monitor. - Psych consult. From their brief encounter, believe he has decision-making capacity. They wanted to speak with his family members to get more information on his baseline  mental status, but he would not give consent.  FEN/GI: Heart healthy diet, SLIV Prophylaxis: Heparin sq  Disposition: Possible hospice vs SNF. Need further discussions with patient and family on wishes. Palliative to follow-up on Monday.  Subjective:  Pt states he is having pain in his legs all over. Cannot really locate any specific area. He denies having any more elbow pain. Denies any shortness of breath or chest pain. Patient is confused this morning asking what day of the week it is and forgetting shortly after. He also states he no longer wants hospice and is unsure about DNR code status.   Objective: Temp:  [97.5 F (36.4 C)-98 F (36.7 C)] 97.5 F (36.4 C) (11/20 0548) Pulse Rate:  [81-99] 81 (11/20 0548) Resp:  [20-22] 20 (11/20 0548) BP: (92-112)/(49-67) 112/55 mmHg (11/20 0548) SpO2:  [95 %-100 %] 95 % (11/20 0548) Weight:  [161 lb 11.2 oz (73.347 kg)] 161 lb 11.2 oz (73.347 kg) (11/20 0548) Physical Exam: General: Alert, NAD, sitting on side of bed eating breakfast ENTM: moist mucous membranes   Cardiovascular: RRR, no murmurs, rubs, gallops Respiratory: CTAB, no wheezes or crackles, [place] in place. Abdomen: BS+, non-tender, non-distended MSK: Tender to palpation of his ankles/feet/legs, no pitting edema; right elbow is edematous. Neuro: Awake, alert, oriented x 2; non-focal Psych: Confused  Laboratory:  Recent Labs Lab 09/20/15 2110  WBC 7.6  HGB 9.1*  HCT 27.2*  PLT 193    Recent Labs Lab 09/22/15 0550 09/23/15 0239 09/25/15 0530  NA 129* 130* 131*  K 3.8 4.0 3.6  CL 89* 89* 92*  CO2 31 32 31  BUN 25* 29* 38*  CREATININE 1.40* 1.62* 1.75*  CALCIUM 7.7* 7.9* 8.1*  GLUCOSE 253* 197* 370*   Troponin: 0.08 BNP: 1284 ABG: pH 7.54, pCO2 35.2, pO2 52 Uric acid 8.8  Imaging/Diagnostic Tests: CXR: Cardiomegaly with slight worsening of bilateral edema/infiltrates, persistent laying effusions.  Pincus Large, DO 09/25/2015, 8:15 AM PGY-2, [facility] Family Medicine FPTS Intern pager: [phone], text pages welcome

## 2015-09-25 NOTE — Progress Notes (Signed)
Orthopedic Tech Progress Note Patient Details:  Russell Hamilton 01/29/1941 [id number]  Ortho Devices Type of Ortho Device: Arm sling Ortho Device/Splint Interventions: Application   [name] 09/25/2015, 12:08 PM

## 2015-09-26 DIAGNOSIS — R45851 Suicidal ideations: Secondary | ICD-10-CM

## 2015-09-26 DIAGNOSIS — Z515 Encounter for palliative care: Secondary | ICD-10-CM | POA: Insufficient documentation

## 2015-09-26 LAB — BASIC METABOLIC PANEL
Anion gap: 7 (ref 5–15)
BUN: 34 mg/dL — AB (ref 6–20)
CHLORIDE: 94 mmol/L — AB (ref 101–111)
CO2: 29 mmol/L (ref 22–32)
CREATININE: 1.46 mg/dL — AB (ref 0.61–1.24)
Calcium: 7.9 mg/dL — ABNORMAL LOW (ref 8.9–10.3)
GFR calc Af Amer: 54 mL/min — ABNORMAL LOW (ref >60)
GFR calc non Af Amer: 46 mL/min — ABNORMAL LOW (ref >60)
GLUCOSE: 256 mg/dL — AB (ref 65–99)
POTASSIUM: 3.8 mmol/L (ref 3.5–5.1)
SODIUM: 130 mmol/L — AB (ref 135–145)

## 2015-09-26 LAB — GLUCOSE, CAPILLARY
GLUCOSE-CAPILLARY: 235 mg/dL — AB (ref 65–99)
GLUCOSE-CAPILLARY: 258 mg/dL — AB (ref 65–99)
GLUCOSE-CAPILLARY: 173 mg/dL — AB (ref 65–99)

## 2015-09-26 MED ORDER — ACETAMINOPHEN 325 MG PO TABS
650.0000 mg | ORAL_TABLET | ORAL | Status: DC
  Administered 2015-09-26 – 2015-09-27 (×7): 650 mg via ORAL
  Filled 2015-09-26 (×7): qty 2

## 2015-09-26 NOTE — Progress Notes (Signed)
Palliative:  I attempted to f/u with Mr. Russell Hamilton today although he is not receptive to anything that I offer. He tells me he changed his mind and does not want to go to SNF. I offer to help speak with him and his niece to discuss other arrangements and support for going home but he again refuses for me to contact any of his family. He says that he does not want my help. Updated teaching service. I will be happy to f/u whenever he is more receptive to assistance. I will await for potential family meeting set up by teaching service (he will not allow me to contact family) to discuss a plan and offer assistance/guidance.   No Charge   Russell ChannelAlicia Keionna Kinnaird, NP Palliative Medicine Team Pager # [phone] (M-F 8a-5p) Team Phone # [phone] (Nights/Weekends)

## 2015-09-26 NOTE — Progress Notes (Signed)
PT Cancellation Note  Patient Details Name: Nat MathJames Dierolf MRN: [id number] DOB: 06/17/1941   Cancelled Treatment:    Reason Eval/Treat Not Completed: Patient declined, no reason specified.  Tx attempted x 2.   Ivar DrapeStout, Ailsa Mireles E 09/26/2015, 5:09 PM   Samul Dadauth Jadon Harbaugh, PT MS Acute Rehab Dept. Number: [facility] [id number] and [facility] [id number] [phone]

## 2015-09-26 NOTE — Progress Notes (Signed)
Family Medicine Teaching Service Daily Progress Note Intern Pager: [phone]  Patient name: Russell Hamilton Medical record number: [id number] Date of birth: 05/09/1941 Age: 74 y.o. Gender: male  Primary Care Provider: Ambrose Finland, NP Consultants: Heart Failure, Psychiatry, Palliative Care Code Status: DNR  Pt Overview and Major Events to Date:  11/15: Admitted to the FPTS with SOB, likely CHF exacerbation 11/18: Had a witnessed seizure by nursing staff- MRI and EEG were normal.  Assessment and Plan: Russell Hamilton is a 74 y.o. male presenting with cough and shortness of breath. 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  Acute systolic CHF Exacerbation. Last ECHO 07/2015: EF 10-15%, diffuse hypokinesis, moderate LV dilation, moderately impaired RV function. On exam, he does not signs of fluid overload. Weight is 164lb on admission > 158lb this morning (dry weight is ~150lbs). Good UOP, 1.9L in the last 24hours, net -3.9L since admission. Has been weaned to room air overnight, with O2 sats in the high 90s. - Continued diuresis with Lasix  PO bid (home dose) - Heart failure team signed off. Continue with diuresis. - agreed with palliative consult - He refused his ECHO this admission - Holding home BP meds in the setting of hypotension.  - Daily weights, strict I/O's - Cardiac monitoring - PT recommending SNF - Pt refused OT consult. - Palliative care consulted. DNR and considering hospice. Prognosis poor. Will likely need family meeting with Niece(Tonya) who helps care for him. Will talk with Palliative care today about scheduling a family meeting and dispo planning.  Possible seizure, resolved: Pt was noted to have seizure-like activity on the morning of 11/18. On exam, he is alert and oriented x2, no focal deficits. - MRI brain: No acute abnormalities - EEG: normal. - Monitor  Right  elbow pain: Not complaining of elbow pain this morning. On exam, his elbow is edematous. ROM is limited by pain. He has been afebrile, feeling well overall, and is able to flex/extend the elbow so septic arthritis less likely. Most likely a occult fracture from possible trauma. X-ray showed joint effusion, cannot rule out septic arthritis or occult fracture. - Will schedule Tylenol today, instead of prn. - Uric acid level elevated at 8.8 - will hold off on treatment at this time; does not seem like gout flare. No other joints are involved. - Will treat with sling.  HTN: Pt has had some soft BPs here like secondary to end stage HFrEF and diuresis, but BPs have improved in the last 24 hours. BP this am 123/60. - Holding home BP meds (Lopressor and Lisinopril) - Will monitor BPs closely.  Hyponatremia, chronic, stable: Na stable at 130 this am. Likely secondary to excess water intake. - Continue fluid restriction. - Monitor with daily BMETs.  COPD: Stable. No wheezes or rhonchi on exam. No increased sputum. Pt has been weaned to room air overnight. - Continue home meds: Albuterol nebulizer and Symbicort  T2DM with peripheral neuropathy: Last HgbA1c (07/13/15): 8.2%. CBGs ranging from 140-263 in the last 24 hours. - Moderate SSI - Lyrica  tid for peripheral neuropathy.  CKD III: Stable. Cr 1.16 on admission > 1.46 today. Baseline 1-1.2. Bump is likely due to diuresis vs hypoperfusion in the setting of end stage HFrEF. - Monitor with daily BMETs - Avoid nephrotoxic agents   Normocytic anemia: Stable Likely anemia of chronic disease, although ferritin in 02/2015 was low-normal (32). Hgb 9.1  on admission. - Continue home med: ferrous sulfate 325mg  bid - Will continue to monitor   Lewy Body Dementia: At this point, we have some concerns about his ability to make his own medical decisions. Psych was consulted and believes he has decision-making capacity based on their brief encounter. - Will  continue to monitor.  FEN/GI: Heart healthy diet, SLIV Prophylaxis: Heparin sq  Disposition: Will work with Palliative care and Pt's family on discharge planning. Pt may be going to SNF vs home with hospice.  Subjective:  Pt states he does not want to be bothered this morning, because he is trying to sleep and people keep coming in to talk to him. He states he is having pain in his elbow and feet. No other concerns.  Objective: Temp:  [97.5 F (36.4 C)] 97.5 F (36.4 C) (11/21 0456) Pulse Rate:  [98-110] 98 (11/21 0552) Resp:  [20-104] 104 (11/21 0456) BP: (118-170)/(55-60) 123/60 mmHg (11/21 0552) SpO2:  [96 %-98 %] 98 % (11/21 0524) Weight:  [158 lb 12.8 oz (72.031 kg)] 158 lb 12.8 oz (72.031 kg) (11/21 0501) Physical Exam: General: Laying in bed with eyes shut, will not open them but will answer questions. ENTM: moist mucous membranes   Cardiovascular: RRR, no murmurs, rubs, gallops Respiratory: CTAB, no wheezes or crackles, [place] in place. Abdomen: BS+, non-tender, non-distended MSK: Tender to palpation of his ankles/feet/legs, no pitting edema; right elbow is edematous. Neuro: Sleeping but answers questions, oriented x 2; non-focal Psych: Angry affect.  Laboratory:  Recent Labs Lab 09/20/15 2110  WBC 7.6  HGB 9.1*  HCT 27.2*  PLT 193    Recent Labs Lab 09/23/15 0239 09/25/15 0530 09/26/15 0436  NA 130* 131* 130*  K 4.0 3.6 3.8  CL 89* 92* 94*  CO2 32 31 29  BUN 29* 38* 34*  CREATININE 1.62* 1.75* 1.46*  CALCIUM 7.9* 8.1* 7.9*  GLUCOSE 197* 370* 256*   Troponin: 0.08 BNP: 1284 ABG: pH 7.54, pCO2 35.2, pO2 52 Uric acid 8.8  Imaging/Diagnostic Tests: CXR: Cardiomegaly with slight worsening of bilateral edema/infiltrates, persistent laying effusions.  Campbell StallKaty Dodd Mayo, MD 09/26/2015, 6:35 AM PGY-1, [place] Family Medicine FPTS Intern pager: [phone], text pages welcome

## 2015-09-26 NOTE — Hospital Discharge Follow-Up (Signed)
Discussed with Jiles CrockerBrenda Chandler, RN CM the discharge status for the patient.  She reported that there is a family meeting planned with the patient and his MD tomorrow, 09/27/15 @ 1300.  The discharge disposition has not yet been determined as the patient has refused any services/support, including SNF, that have been recommended. She suggested that this CM attend that meeting tomorrow as [facility].

## 2015-09-26 NOTE — Progress Notes (Signed)
Interim Progress Note: I have spoken with Alcide Cleveronya Amos (Pt's niece), Duwaine MaxinRichard Rivers (Pt's brother), and Yong Channellicia Parker (Palliative care) and we have planned for a family meeting tomorrow 11/22 at 1:00pm. We will discuss placement at discharge and discuss if Pt and the family would like for him to be enrolled in Hospice at this time.  Willadean CarolKaty Mayo, MD PGY-1

## 2015-09-26 NOTE — Progress Notes (Signed)
Talked to patient again about DCP; CM talked to patient about having a nurse or hospice to go to his home at discharge, patient stated " No." CM asked patient who does he have to check on him. Patient stated, " I am fine." Patient desperately needs help but will not allow me to provide any assistance for him at this time. CM will watch from a distance. Abelino DerrickB Daylin Eads [facility] City DentonRN,MHA,BSN [phone]

## 2015-09-26 NOTE — Care Management Important Message (Signed)
Important Message  Patient Details  Name: Nat MathJames Ralls MRN: [id number] Date of Birth: 08/27/1941   Medicare Important Message Given:  Yes    Oralia RudMegan P Ernesto Lashway 09/26/2015, 3:29 PM

## 2015-09-27 ENCOUNTER — Telehealth: Payer: Self-pay

## 2015-09-27 LAB — BASIC METABOLIC PANEL
Anion gap: 9 (ref 5–15)
BUN: 36 mg/dL — AB (ref 6–20)
CHLORIDE: 94 mmol/L — AB (ref 101–111)
CO2: 29 mmol/L (ref 22–32)
Calcium: 8.2 mg/dL — ABNORMAL LOW (ref 8.9–10.3)
Creatinine, Ser: 1.49 mg/dL — ABNORMAL HIGH (ref 0.61–1.24)
GFR calc Af Amer: 52 mL/min — ABNORMAL LOW (ref >60)
GFR calc non Af Amer: 45 mL/min — ABNORMAL LOW (ref >60)
GLUCOSE: 273 mg/dL — AB (ref 65–99)
POTASSIUM: 3.7 mmol/L (ref 3.5–5.1)
Sodium: 132 mmol/L — ABNORMAL LOW (ref 135–145)

## 2015-09-27 LAB — GLUCOSE, CAPILLARY
Glucose-Capillary: 193 mg/dL — ABNORMAL HIGH (ref 65–99)
Glucose-Capillary: 177 mg/dL — ABNORMAL HIGH (ref 65–99)

## 2015-09-27 NOTE — Progress Notes (Signed)
Lisa with Hospice and Palliative Care called and updated about referral, will see patient today and contact his niece Archie Pattenonya, clinical information faxed to hospice. Abelino DerrickB Nihal Marzella [facility] SmithRN,MHA,BSN [phone]

## 2015-09-27 NOTE — Progress Notes (Signed)
Notified by Steward DroneBrenda, [facility]townCMRN of family request [facility] after discharge. Chart and patient information currently under review to confirm hospice eligibility.   Spoke with patient and niece, at bedside to initiate education related to hospice philosophy, services and team approach to care. Family verbalized understanding of the information provided. Per discussion plan is for discharge to home by [facility]AR as early as today, pending the delivery of needed DME. Patient seemed agitated at first mention of hospice services, but then stated he could be "hard headed."  Ultimately, he verbalized his desire to be home and would appreciate the support hospice can provide.  Please send signed completed DNR form home with patient.  Patient will need prescriptions for discharge comfort medications.   DME needs discussed and family requested hospital bed, bedside table, oxygen, light weight W/C and walker for delivery to the home today.  [facility] equipment manager Jewel Kizzie BaneHughes notified and will contact [facility] to arrange delivery to the home.  The home address has been verified and is correct in the chart; Lilyan Puntonja is family member to be contacted to arrange time of delivery.   [facility] aware of the above.  Completed discharge summary will need to be faxed to [facility] at [phone] when final.   Please notify [facility] when patient is ready to leave unit at discharge-call [phone].  [facility] information and contact numbers have been given to [facility] during visit.  Above information shared with Steward DroneBrenda, [facility] Center[facility]RN.   Please call with any questions.  Hessie KnowsStacie Wilkinson RN, BSN  [facility] Hospital Liaison  [phone]

## 2015-09-27 NOTE — Progress Notes (Signed)
EMS arrived to take patient home. Patient d/c'd. Vitals WNL. Blood pressure 111/70, pulse 90, temperature 97.4 F (36.3 C), temperature source Oral, resp. rate 18, height 5' 9.5" (1.765 m), weight 73.256 kg (161 lb 8 oz), SpO2 95 %. Troy SineWalker, Clotee Schlicker M

## 2015-09-27 NOTE — Progress Notes (Signed)
Inpatient Diabetes Program Recommendations  AACE/ADA: New Consensus Statement on Inpatient Glycemic Control (2015)  Target Ranges:  Prepandial:   less than 140 mg/dL      Peak postprandial:   less than 180 mg/dL (1-2 hours)      Critically ill patients:  140 - 180 mg/dL   Review of Glycemic Control  Results for Russell Hamilton, Russell Hamilton (MRN [id number]) as of 09/27/2015 09:34  Ref. Range 09/25/2015 21:00 09/26/2015 05:51 09/26/2015 11:24 09/26/2015 16:37 09/27/2015 06:04  Glucose-Capillary Latest Ref Range: 65-99 mg/dL 956189 (H) 387235 (H) 564258 (H) 173 (H) 193 (H)    Diabetes history: DM 2 Outpatient Diabetes medications: Amaryl 2mg /day, Januvia 50mg /day Note from RN Christena DeemShannon Willis states "pt reported not taking Januvia + Amaryl at home" Current orders for Inpatient glycemic control: Novolog 0-15 units tid with meals  Inpatient Diabetes Program Recommendations: Fasting blood sugars this am is 193mg /dl.  Consider starting 2 units Novolog tid with meals (hold if patient eats less than 50%).   Consider starting low dose basal insulin, consider Lantus 2 units qhs.   Susette RacerJulie Judye Lorino, RN, BA, MHA, CDE Diabetes Coordinator Inpatient Diabetes Program  [phone] (Team Pager) [phone] [facility] Office) 09/27/2015 9:53 AM

## 2015-09-27 NOTE — Progress Notes (Signed)
Patient had signed discharge instructions, iv and telemetry had both been d/c. Patient's niece called to inform RN that home is ready for patient. EMS called to transport patient. Upon informing patient that EMS was en route, patient became agitated and began yelling that he can't go home. Patient had already been discharged and was just waiting on ride. Dr. Althea CharonKaramalegos informed, no further action to be done at this time. Per MD, all options had been discussed and agreed upon by both family and patient.

## 2015-09-27 NOTE — Progress Notes (Signed)
Family conference with patient, Russell Hamilton his niece and his brother about DCP.  Patient has decided to go home with Hospice. Hospice choice offered, [facility]. Referral made as requested. Abelino DerrickB Nhu Gl[facility]by [facility] NorthRN,MHA,BSN [phone]

## 2015-09-27 NOTE — Progress Notes (Addendum)
CSW notified by unit RNCM that patient will be d/c'd home this evening after hospital bed has been delivered to patient's home. CSW spoke with patient's nurse who indicated that the family will notify patient once hospital bed was delivered and patient would notify RN.  EMS certification form completed and unit nurse instructed on how to request EMS transport once DME in place. She verbalized understanding. Patient is very anxious to return home and adamantly refuses SNF placement.  CSW will sign off.  Lorri Frederickonna T. Andria RheinCrowder, LCSW [phone]   CSW spoke with Leotis ShamesLauren, RN as patient remains in hospital as of 9 pm.  She stated that nursing has spoken with [facility] rep and hospital bed is anticipated to be delivered by 11 pm. She stated that patient is dressed and waiting for d/c.  Lorri Frederickonna T. Jaci LazierCrowder, KentuckyLCSW [phone]

## 2015-09-27 NOTE — Discharge Instructions (Signed)
You were hospitalized because you had a worsening of your congestive heart failure. We gave you a medication to help you get rid of the extra fluid. Your breathing greatly improved throughout your hospitalization. After discussing all of your options with your family and our medical team, you decided that you wanted to return home with Hospice. We have enrolled you in Hospice so that you can continue to receive care and be kept comfortable. We have stopped a lot of your medications because your blood pressures have been well controlled while you were in the hospital.

## 2015-09-27 NOTE — Telephone Encounter (Signed)
Russell Hamilton with Hospice called. Patient is being D/Cd from hospital today.  Dr. Venetia NightAmao will be the patients attending provider. Lynden AngVicky is checking to see if Dr. Venetia NightAmao would like Hospice to help with symptom management, home visits and opioids.  Nurse spoke with Dr. Venetia NightAmao. Per Dr. Venetia NightAmao, she would like Hospice to help out with patients care.  Nurse gave Lynden AngVicky, Dr. Jen MowAmao's nurses direct telephone number for any needs and communication.  Russell Hamilton also has nurse supervisor's direct number if needed.

## 2015-09-27 NOTE — Progress Notes (Signed)
PT Cancellation Note  Patient Details Name: Russell Hamilton MRN: [id number] DOB: 02/03/1941   Cancelled Treatment:    Reason Eval/Treat Not Completed: Other (comment);Fatigue/lethargy limiting ability to participate; patient reporting unable to get up due to LE pain and dizziness.  Offered to check orthostatic vitals, but pt agitated and continued to refuse.  Spoke with case manager who confirms pt due to d/c today with Hospice services.    WYNN,CYNDI 09/27/2015, 2:44 PM Sheran Lawlessyndi Wynn, PT [phone] 09/27/2015

## 2015-09-27 NOTE — Hospital Discharge Follow-Up (Signed)
Family meeting with the patient, Dr Nancy MarusMayo and Yong ChannelAlicia Parker, NP, his niece Archie Patten- Tonya, and his brother.  Options for a discharge plan to SNF or  home hospice were discussed.  The patient eventually decided on home hospice services after meeting with Jiles CrockerBrenda Chandler, RN CM.

## 2015-09-27 NOTE — Progress Notes (Signed)
Attending MD updated on DCP, home with hospice care and patient is medically ready for discharge home today. Lisa with [facility] called - DME, hospital bed to be delivered to the home today prior to discharge. Abelino DerrickB Semaj Coburn [facility] CenterRN,MHA,BSN [phone]

## 2015-09-27 NOTE — Progress Notes (Signed)
Family Medicine Teaching Service Daily Progress Note Intern Pager: [phone]  Patient name: Russell Hamilton Medical record number: [id number] Date of birth: 07-Mar-1941 Age: 74 y.o. Gender: male  Primary Care Provider: Ambrose Finland, NP Consultants: Heart Failure, Psychiatry, Palliative Care Code Status: DNR  Pt Overview and Major Events to Date:  11/15: Admitted to the FPTS with SOB, likely CHF exacerbation 11/18: Had a witnessed seizure by nursing staff- MRI and EEG were normal.  Assessment and Plan: Russell Hamilton is a 74 y.o. male presenting with cough and shortness of breath. 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  Acute systolic CHF Exacerbation. Last ECHO 07/2015: EF 10-15%, diffuse hypokinesis, moderate LV dilation, moderately impaired RV function. On exam, he does not signs of fluid overload. Weight is 164lb on admission > 161lb this morning (dry weight is ~150lbs). UOP 800cc's in the last 24hours, net -3.6L since admission. O2 saturations ranging from 92-100% on room air. - Continued diuresis with Lasix  PO bid (home dose) - He refused his ECHO this admission - Holding home BP meds in the setting of hypotension.  - Daily weights, strict I/O's - Cardiac monitoring - PT recommending SNF - Palliative care consulted. Pt made DNR. Family meeting planned for 1:00pm today with Pt's niece, Pt's brother, Russell Hamilton (Palliative care), and the care manager from [facility]. Will discuss placement after discharge and if Pt and family would like for him to be enrolled in hospice at this time.  Right elbow pain: Pt states he has a little bit of elbow pain this morning. On exam, his elbow is edematous. ROM is limited by pain. He has been afebrile, feeling well overall, and is able to flex/extend the elbow so septic arthritis less likely. Most likely a occult fracture from possible trauma. X-ray showed  joint effusion, cannot rule out septic arthritis or occult fracture. Uric acid is 8.8, so gout flare is less likely. - Continued scheduled Tylenol for pain control. - Will treat with sling.  HTN: Pt has had some soft BPs here likely secondary to end stage HFrEF and diuresis, but BPs have been stable in the last 24 hours, ranging from 101-119/61-65. - Holding home BP meds (Lopressor and Lisinopril) - Will monitor BPs closely.  Hyponatremia, chronic, stable: Na stable at 132 this am. Likely secondary to excess water intake. - Continue fluid restriction. - Monitor with daily BMETs.  COPD: Stable. No wheezes or rhonchi on exam. No increased sputum. Pt has had O2 saturations between 92-100% on room air in the last 24 hours. - Continue home meds: Albuterol nebulizer and Symbicort  T2DM with peripheral neuropathy: Last HgbA1c (07/13/15): 8.2%. CBGs ranging from 173-258 in the last 24 hours. - Moderate SSI - Lyrica  tid for peripheral neuropathy.  CKD III: Stable. Cr 1.16 on admission > 1.49 today. Baseline 1-1.2. Bump is likely due to diuresis vs hypoperfusion in the setting of end stage HFrEF. - Monitor with daily BMETs - Avoid nephrotoxic agents   Normocytic anemia: Stable. Likely anemia of chronic disease, although ferritin in 02/2015 was low-normal (32). Hgb 9.1 on admission. - Continue home med: ferrous sulfate  bid - Will continue to monitor   Lewy Body Dementia: At this point, we have some concerns about his ability to make his own medical decisions. Psych was consulted and believes he has decision-making capacity based on their brief encounter. - Will continue to monitor.  FEN/GI: Heart healthy diet, SLIV Prophylaxis: Heparin sq  Disposition: Family meeting scheduled for today to determine placement at discharge.  Subjective:  Pt states he is doing well this morning. He is thinking about if he wants to go to a SNF and be enrolled in hospice. He has no other concerns. He is  looking forward to seeing his family this afternoon.  Objective: Temp:  [97.7 F (36.5 C)-98.2 F (36.8 C)] 97.7 F (36.5 C) (11/22 0611) Pulse Rate:  [86-104] 88 (11/22 0611) Resp:  [18-20] 18 (11/22 0611) BP: (101-119)/(56-66) 101/65 mmHg (11/22 0611) SpO2:  [89 %-100 %] 96 % (11/22 0611) Weight:  [161 lb 8 oz (73.256 kg)] 161 lb 8 oz (73.256 kg) (11/22 16100611) Physical Exam: General: Tired-appearing but conversational, answers questions this morning, pleasant. ENTM: moist mucous membranes   Cardiovascular: RRR, no murmurs, rubs, gallops Respiratory: CTAB, no wheezes or crackles. Abdomen: BS+, non-tender, non-distended MSK: Tenderness to palpation of his ankles/feet/legs, no pitting edema; right elbow is edematous; no sling in place. Neuro: Answers questions, oriented x 2; no focal deficits Psych: Appropriate mood and affect.  Laboratory:  Recent Labs Lab 09/20/15 2110  WBC 7.6  HGB 9.1*  HCT 27.2*  PLT 193    Recent Labs Lab 09/25/15 0530 09/26/15 0436 09/27/15 0244  NA 131* 130* 132*  K 3.6 3.8 3.7  CL 92* 94* 94*  CO2 31 29 29   BUN 38* 34* 36*  CREATININE 1.75* 1.46* 1.49*  CALCIUM 8.1* 7.9* 8.2*  GLUCOSE 370* 256* 273*   Troponin: 0.08 BNP: 1284 ABG: pH 7.54, pCO2 35.2, pO2 52 Uric acid 8.8  Imaging/Diagnostic Tests: CXR: Cardiomegaly with slight worsening of bilateral edema/infiltrates, persistent laying effusions. MRI brain: Mild to moderate microvascular ischemia. No acute intracranial abnormality. X-ray right elbow: Joint effusion. Superimposed infectious process cannot be excluded. Radiographically occult fracture of the elbow is not excluded.   Russell StallKaty Dodd Mayo, MD 09/27/2015, 6:50 AM PGY-1, [facility] Family Medicine FPTS Intern pager: [phone], text pages welcome

## 2015-09-27 NOTE — Progress Notes (Signed)
OT Cancellation Note  Patient Details Name: Nat MathJames Pedro MRN: [id number] DOB: 04/06/1941   Cancelled Treatment:    Reason Eval/Treat Not Completed: Other (comment). In to see pt for treatment session. He responds to me but keeps his eyes closed. Told him I would like to help him sit up on EOB and work on washing his face and mouth care. He said "I would like to sit up, but don't feel like it, I just want to sleep because I didn't sleep well last night." Tried several times to encourage him to at least sit up on EOB and he kept politely refusing.  Evette GeorgesLeonard, Shelah Heatley Eva [phone] 09/27/2015, 9:50 AM

## 2015-09-28 ENCOUNTER — Telehealth: Payer: Self-pay

## 2015-09-28 LAB — GLUCOSE, CAPILLARY
GLUCOSE-CAPILLARY: 192 mg/dL — AB (ref 65–99)
Glucose-Capillary: 208 mg/dL — ABNORMAL HIGH (ref 65–99)

## 2015-09-28 NOTE — Telephone Encounter (Signed)
Patient discharged from hospital with home hospice services on 09/27/15.  Call placed to patient to determine if home hospice services started and to remind him of upcoming appointment with Dr. Venetia NightAmao on 10/04/15 at 1445. Unable to reach patient; voicemail left requesting return call.

## 2015-10-03 ENCOUNTER — Telehealth: Payer: Self-pay | Admitting: Internal Medicine

## 2015-10-03 ENCOUNTER — Telehealth: Payer: Self-pay

## 2015-10-03 NOTE — Telephone Encounter (Signed)
Attempted to contact the patient to check on his status. Call placed to # [phone] (H) and a HIPAA compliant voice mail message was left requesting a call back to # [phone] or [phone].  Call then placed to [facility] [place] # [phone] and spoke to Amy who confirmed that their admission nurse went to see the patient on 09/28/15 and the SW went to the home on 09/30/15. She stated that the [place] visit was made 10/02/15.  She then transferred the call to the nurse, Rickey Barbara, who met with the patient. This CM left a voice mail message for the nurse requesting a call back to # [phone] or [phone].

## 2015-10-03 NOTE — Telephone Encounter (Signed)
Patient called requesting to speak with Erskine SquibbJane. Please follow up with patient

## 2015-10-03 NOTE — Telephone Encounter (Signed)
Luster LandsbergRenee is calling to obtain a new rx for: Ipratropium Bromide Inhaler Solution 0.02% Taking it 2.535ml's once a day He has the machine but doesn't have the treatment. He is new to hospice and they are trying to get all medications together. It seems that he is getting this from the hospital.   [facility] on [address] Please follow up with Renee if this is something we can or can not prescribe. Thank you.

## 2015-10-04 ENCOUNTER — Telehealth: Payer: Self-pay

## 2015-10-04 ENCOUNTER — Inpatient Hospital Stay: Payer: Medicare Other | Admitting: Family Medicine

## 2015-10-04 MED ORDER — IPRATROPIUM BROMIDE 0.02 % IN SOLN: 0.5000 mg | Freq: Four times a day (QID) | RESPIRATORY_TRACT | Status: AC | PRN

## 2015-10-04 NOTE — Telephone Encounter (Signed)
Attempted to contact Cameron Proudene Smith,Hospice Nurse - [facility] # [phone] to discuss the patient's medication status and need for medical follow up /transportation to his appointments. Voice mail message left with call back requested to # [phone] or  [phone].

## 2015-10-04 NOTE — Telephone Encounter (Signed)
Confirmed with Russell Hamilton, [facility], that the patient has a refill of his albuterol inhaler waiting for pick up.   Call received from the patient inquiring about the inhaler. Informed him that the refill is waiting for him and also notified him that he has an appointment that has been scheduled for him with [name] for 10/11/15 @ 1500.  He was appreciative of the appointment.  He then said that if his niece, Russell Hamilton is not able to pick up the inhaler, he will come to the clinic himself to pick it up tomorrow.  He said that he will just call for a cab.  He then noted that he will check with Russell Hamilton tonight about picking up the medication for him tomorrow.   Call placed again to [name], Hospice RN. # [phone].  She confirmed that she has seen the patient once and she will be seeing him again on Friday, 10/07/15.  She said that the SW will accompany her on the visit. She noted that they discussed home health aide services with him and he refused the help. She said that he is walking around his apartment without an assistive device. She has instructed him about the importance of using his cane or walker; but he has refused to use any assistive device.    She noted that Russell Hamilton has brought him food and the SW will speak to him about Meals on Wheels.  She said that Russell Hamilton has not purchased a medication box as instructed by hospice and it has been difficult to reconcile is medications. [name] noted that she is not able to determine his compliance with his medications yet. Informed her that he has called about the albuterol inhaler and she stated that hospice is not able to pick up the medication for him. Informed her that he stated that he may come to pick it up himself if Russell Hamilton is not able to get it for him.  Inquired about the role of the hospice MD.  She stated that their patients have an attending and for this patient it will be [name] at this time.  She said that the hospice doctor provides  symptom management ( ie- pain control) and can make home visits if needed; but the patients continue to have their general medical care managed by their provider.   [name], hospice nurse, was very appreciative of the call.

## 2015-10-04 NOTE — Telephone Encounter (Signed)
Call received from the patient.  He stated that he is doing "fine" and feels much better."" He said that the hospice nurse has been out to see him and the he is having an aide visit him twice a week. He noted that his niece, Russell Hamilton, is bringing him food and she cleaned his apartment before he came home from the hospital.  He said that he is not able to find his "red inhaler" and Tanya can't remember where is went.  He then spoke about his family and that he has had a number of visitors including his sister and brother in law from [place] as well as his nephews and other family members.  He stated that they talked a lot an laughed about " things they used do."  He commented on how much he enjoyed their company.  He said that he would like to see [name] but he would need specialized transportation as he is not able to walk to get in a cab.   CM to discuss his needs with [name] as well as the hospice nurse.

## 2015-10-04 NOTE — Telephone Encounter (Signed)
Returned call to the patient # [phone] and a HIPAA compliant voice mail message was left requesting a call back to # [phone] or # [phone]

## 2015-10-04 NOTE — Telephone Encounter (Signed)
Returned call to Russell Hamilton @ [phone] Patient will be needing solution for [facility]

## 2015-10-05 ENCOUNTER — Telehealth: Payer: Self-pay

## 2015-10-05 NOTE — Telephone Encounter (Signed)
Attempted to contact the patient again to check on his status and to inquire about picking [facility].  Call placed to # [phone] and a HIPAA compliant voice mail message was left requesting a call back to # [phone] or [phone].   Call then placed to [name] , Hospice Nurse # [phone] to notify her that the patient has not responded to CM calls today. She answered the call but noted that she could not speak as she was with another patient.  She requested to call back at another time.

## 2015-10-05 NOTE — Telephone Encounter (Signed)
Call placed to the patient to check on his status and inquire if his niece will be able to pick up his inhaler  Call placed to # [phone] (H) and a voice mail message was left requesting a call back to # [phone].

## 2015-10-06 ENCOUNTER — Telehealth: Payer: Self-pay

## 2015-10-06 NOTE — Telephone Encounter (Signed)
This Case Manager placed call to [name], RN with [facility] [phone]),  to discuss patient's need for medication management and medication box set-up. Hospice RN and SW scheduled to see patient again on 10/07/15.  Unable to reach; voicemail left requesting return call.  Call placed to patient to inform him his albuterol inhaler ready for pick-up. He indicated he was aware, and he would try to pick it up tomorrow-10/07/15. He indicated he was [facility] "to determine what benefits [he] is eligible for".  No additional needs identified.

## 2015-10-10 ENCOUNTER — Telehealth: Payer: Self-pay

## 2015-10-10 NOTE — Telephone Encounter (Signed)
Attempted to contact the patient to check on his status and to remind him of his appointment tomorrow, 10/11/15 @1500 .  Call placed to  # [phone] (H) and a HIPAA compliant voice mail message was left requesting a call back to # [phone] or [phone].

## 2015-10-11 ENCOUNTER — Telehealth: Payer: Self-pay

## 2015-10-11 ENCOUNTER — Inpatient Hospital Stay: Payer: Self-pay | Admitting: Family Medicine

## 2015-10-11 NOTE — Telephone Encounter (Signed)
Call received from the patient stating the he just woke up and he knows that he [facility].  He said that he did not receive any reminder calls and he has been sleeping for awhile. Informed him that calls were made yesterday and today to remind him. He noted that he would like to reschedule his appointment with [name].   As per Esmond Plantsecilia deLuna, [facility] scheduler, the next available appointment that [name] has is 10/20/15  @ 0900.  The patient did not want that appointment and does not want a morning appointment. Explained to the patient that [name] was gone for the evening and CM would check with her tomorrow to inquire if she has any availability to see the patient before 10/20/15.  The patient was agreeable to having CM call him tomorrow with an appointment.  He said that his hospice nurse had not been to see him yet today.  He also reported that he [facility] to [facility] [place] and is now waiting for his award letter.

## 2015-10-11 NOTE — Telephone Encounter (Signed)
The patient has not returned calls from the [facility].  Call placed to the patient's niece, Russell Hamilton # [phone] to notify her that [facility] to [facility] him .  A HIPAA compliant voice mail message was left requesting a call back to # [phone] or [phone].   A call was then placed to Russell Hamilton , Hospice nurse # [phone] and informed her that [facility] not been [facility] the patient and [facility].  She said that she is going to call the patient's niece and is then going to see the patient this afternoon and she will call this CM back with an update.  She also confirmed that [place] [place] Friday, [place].

## 2015-10-11 NOTE — Telephone Encounter (Signed)
Attempted to contact the patient to remind him of [facility] 1500 and to inquire if he needs transportation to the clinic.   Call placed to # [phone] and a HIPAA compliant  voice mail message was left requesting a call back to # [phone] or [phone].

## 2015-10-14 ENCOUNTER — Encounter: Payer: Medicare Other | Admitting: Podiatry

## 2015-10-14 NOTE — Progress Notes (Signed)
HPI Presents today chief complaint of painful elongated toenails.  Objective: Pulses are palpable bilateral nails are thick, yellow dystrophic onychomycosis and painful palpation.   Assessment: Onychomycosis with pain in limb.  Plan: Treatment of nails in thickness and length as covered service secondary to pain.  

## 2015-10-17 ENCOUNTER — Inpatient Hospital Stay (HOSPITAL_COMMUNITY)
Admission: EM | Admit: 2015-10-17 | Discharge: 2015-10-21 | DRG: 291 | Disposition: A | Discharge status: Hospice | Attending: Family Medicine | Admitting: Family Medicine

## 2015-10-17 ENCOUNTER — Encounter (HOSPITAL_COMMUNITY): Payer: Self-pay | Admitting: Emergency Medicine

## 2015-10-17 ENCOUNTER — Telehealth: Payer: Self-pay

## 2015-10-17 ENCOUNTER — Emergency Department (HOSPITAL_COMMUNITY)

## 2015-10-17 DIAGNOSIS — I272 Other secondary pulmonary hypertension: Secondary | ICD-10-CM | POA: Diagnosis present

## 2015-10-17 DIAGNOSIS — E1140 Type 2 diabetes mellitus with diabetic neuropathy, unspecified: Secondary | ICD-10-CM | POA: Diagnosis present

## 2015-10-17 DIAGNOSIS — Z8249 Family history of ischemic heart disease and other diseases of the circulatory system: Secondary | ICD-10-CM

## 2015-10-17 DIAGNOSIS — J189 Pneumonia, unspecified organism: Secondary | ICD-10-CM | POA: Diagnosis present

## 2015-10-17 DIAGNOSIS — R0602 Shortness of breath: Secondary | ICD-10-CM | POA: Diagnosis present

## 2015-10-17 DIAGNOSIS — M109 Gout, unspecified: Secondary | ICD-10-CM | POA: Diagnosis present

## 2015-10-17 DIAGNOSIS — I739 Peripheral vascular disease, unspecified: Secondary | ICD-10-CM | POA: Diagnosis present

## 2015-10-17 DIAGNOSIS — Z7984 Long term (current) use of oral hypoglycemic drugs: Secondary | ICD-10-CM | POA: Diagnosis not present

## 2015-10-17 DIAGNOSIS — Z9114 Patient's other noncompliance with medication regimen: Secondary | ICD-10-CM

## 2015-10-17 DIAGNOSIS — N183 Chronic kidney disease, stage 3 unspecified: Secondary | ICD-10-CM | POA: Diagnosis present

## 2015-10-17 DIAGNOSIS — F17210 Nicotine dependence, cigarettes, uncomplicated: Secondary | ICD-10-CM | POA: Diagnosis present

## 2015-10-17 DIAGNOSIS — M25522 Pain in left elbow: Secondary | ICD-10-CM | POA: Insufficient documentation

## 2015-10-17 DIAGNOSIS — I130 Hypertensive heart and chronic kidney disease with heart failure and stage 1 through stage 4 chronic kidney disease, or unspecified chronic kidney disease: Principal | ICD-10-CM | POA: Diagnosis present

## 2015-10-17 DIAGNOSIS — D539 Nutritional anemia, unspecified: Secondary | ICD-10-CM | POA: Diagnosis present

## 2015-10-17 DIAGNOSIS — I5023 Acute on chronic systolic (congestive) heart failure: Secondary | ICD-10-CM | POA: Diagnosis present

## 2015-10-17 DIAGNOSIS — J441 Chronic obstructive pulmonary disease with (acute) exacerbation: Secondary | ICD-10-CM | POA: Diagnosis present

## 2015-10-17 DIAGNOSIS — E1165 Type 2 diabetes mellitus with hyperglycemia: Secondary | ICD-10-CM | POA: Diagnosis present

## 2015-10-17 DIAGNOSIS — Z9119 Patient's noncompliance with other medical treatment and regimen: Secondary | ICD-10-CM

## 2015-10-17 DIAGNOSIS — G894 Chronic pain syndrome: Secondary | ICD-10-CM | POA: Diagnosis present

## 2015-10-17 DIAGNOSIS — G3183 Dementia with Lewy bodies: Secondary | ICD-10-CM | POA: Diagnosis present

## 2015-10-17 DIAGNOSIS — I5021 Acute systolic (congestive) heart failure: Secondary | ICD-10-CM | POA: Diagnosis present

## 2015-10-17 DIAGNOSIS — I252 Old myocardial infarction: Secondary | ICD-10-CM

## 2015-10-17 DIAGNOSIS — R262 Difficulty in walking, not elsewhere classified: Secondary | ICD-10-CM | POA: Diagnosis present

## 2015-10-17 DIAGNOSIS — F0390 Unspecified dementia without behavioral disturbance: Secondary | ICD-10-CM | POA: Diagnosis present

## 2015-10-17 DIAGNOSIS — I509 Heart failure, unspecified: Secondary | ICD-10-CM | POA: Insufficient documentation

## 2015-10-17 DIAGNOSIS — IMO0002 Reserved for concepts with insufficient information to code with codable children: Secondary | ICD-10-CM | POA: Diagnosis present

## 2015-10-17 DIAGNOSIS — J45909 Unspecified asthma, uncomplicated: Secondary | ICD-10-CM | POA: Diagnosis present

## 2015-10-17 DIAGNOSIS — R52 Pain, unspecified: Secondary | ICD-10-CM

## 2015-10-17 DIAGNOSIS — Z8673 Personal history of transient ischemic attack (TIA), and cerebral infarction without residual deficits: Secondary | ICD-10-CM | POA: Diagnosis not present

## 2015-10-17 DIAGNOSIS — Z515 Encounter for palliative care: Secondary | ICD-10-CM | POA: Diagnosis present

## 2015-10-17 DIAGNOSIS — F0280 Dementia in other diseases classified elsewhere without behavioral disturbance: Secondary | ICD-10-CM | POA: Diagnosis present

## 2015-10-17 DIAGNOSIS — Z91199 Patient's noncompliance with other medical treatment and regimen due to unspecified reason: Secondary | ICD-10-CM

## 2015-10-17 DIAGNOSIS — E1129 Type 2 diabetes mellitus with other diabetic kidney complication: Secondary | ICD-10-CM | POA: Diagnosis present

## 2015-10-17 DIAGNOSIS — E1122 Type 2 diabetes mellitus with diabetic chronic kidney disease: Secondary | ICD-10-CM | POA: Diagnosis present

## 2015-10-17 DIAGNOSIS — I5022 Chronic systolic (congestive) heart failure: Secondary | ICD-10-CM | POA: Diagnosis present

## 2015-10-17 DIAGNOSIS — K219 Gastro-esophageal reflux disease without esophagitis: Secondary | ICD-10-CM | POA: Diagnosis present

## 2015-10-17 DIAGNOSIS — Z9981 Dependence on supplemental oxygen: Secondary | ICD-10-CM

## 2015-10-17 LAB — URINE MICROSCOPIC-ADD ON: RBC / HPF: NONE SEEN RBC/hpf (ref 0–5)

## 2015-10-17 LAB — BASIC METABOLIC PANEL
Anion gap: 12 (ref 5–15)
BUN: 9 mg/dL (ref 6–20)
CO2: 23 mmol/L (ref 22–32)
Calcium: 8.7 mg/dL — ABNORMAL LOW (ref 8.9–10.3)
Chloride: 103 mmol/L (ref 101–111)
Creatinine, Ser: 1.2 mg/dL (ref 0.61–1.24)
GFR calc Af Amer: >60 mL/min (ref >60)
GFR, EST NON AFRICAN AMERICAN: 59 mL/min — AB (ref >60)
GLUCOSE: 144 mg/dL — AB (ref 65–99)
POTASSIUM: 4.4 mmol/L (ref 3.5–5.1)
Sodium: 138 mmol/L (ref 135–145)

## 2015-10-17 LAB — BRAIN NATRIURETIC PEPTIDE: B Natriuretic Peptide: 1715.9 pg/mL — ABNORMAL HIGH (ref 0.0–100.0)

## 2015-10-17 LAB — URINALYSIS, ROUTINE W REFLEX MICROSCOPIC
Glucose, UA: NEGATIVE mg/dL
Hgb urine dipstick: NEGATIVE
Ketones, ur: NEGATIVE mg/dL
NITRITE: POSITIVE — AB
PH: 5.5 (ref 5.0–8.0)
Protein, ur: 100 mg/dL — AB
SPECIFIC GRAVITY, URINE: 1.024 (ref 1.005–1.030)

## 2015-10-17 LAB — I-STAT CG4 LACTIC ACID, ED
Lactic Acid, Venous: 4.84 mmol/L (ref 0.5–2.0)
Lactic Acid, Venous: 2.55 mmol/L (ref 0.5–2.0)

## 2015-10-17 LAB — MRSA PCR SCREENING: MRSA by PCR: NEGATIVE

## 2015-10-17 LAB — TROPONIN I: Troponin I: <0.03 ng/mL (ref <0.031)

## 2015-10-17 MED ORDER — SODIUM CHLORIDE 0.9 % IJ SOLN
3.0000 mL | Freq: Two times a day (BID) | INTRAMUSCULAR | Status: DC
  Administered 2015-10-18 – 2015-10-20 (×4): 3 mL via INTRAVENOUS

## 2015-10-17 MED ORDER — ASPIRIN EC 81 MG PO TBEC
81.0000 mg | DELAYED_RELEASE_TABLET | Freq: Every day | ORAL | Status: DC
  Administered 2015-10-18 – 2015-10-19 (×2): 81 mg via ORAL
  Filled 2015-10-17 (×5): qty 1

## 2015-10-17 MED ORDER — SODIUM CHLORIDE 0.9 % IJ SOLN: 3.0000 mL | INTRAMUSCULAR | Status: DC | PRN

## 2015-10-17 MED ORDER — FENTANYL CITRATE (PF) 100 MCG/2ML IJ SOLN
50.0000 ug | Freq: Once | INTRAMUSCULAR | Status: AC
  Administered 2015-10-17: 50 ug via INTRAVENOUS
  Filled 2015-10-17: qty 2

## 2015-10-17 MED ORDER — SODIUM CHLORIDE 0.9 % IV SOLN: 250.0000 mL | INTRAVENOUS | Status: DC | PRN

## 2015-10-17 MED ORDER — PANTOPRAZOLE SODIUM 40 MG PO TBEC
40.0000 mg | DELAYED_RELEASE_TABLET | Freq: Every day | ORAL | Status: DC
  Administered 2015-10-18 – 2015-10-19 (×2): 40 mg via ORAL
  Filled 2015-10-17 (×4): qty 1

## 2015-10-17 MED ORDER — VANCOMYCIN HCL IN DEXTROSE 750-5 MG/150ML-% IV SOLN
750.0000 mg | Freq: Two times a day (BID) | INTRAVENOUS | Status: DC
  Filled 2015-10-17: qty 150

## 2015-10-17 MED ORDER — FUROSEMIDE 10 MG/ML IJ SOLN
80.0000 mg | Freq: Once | INTRAMUSCULAR | Status: AC
  Administered 2015-10-17: 80 mg via INTRAVENOUS
  Filled 2015-10-17: qty 8

## 2015-10-17 MED ORDER — GABAPENTIN 300 MG PO CAPS
600.0000 mg | ORAL_CAPSULE | Freq: Two times a day (BID) | ORAL | Status: DC
  Administered 2015-10-18 – 2015-10-20 (×5): 600 mg via ORAL
  Filled 2015-10-17 (×7): qty 2

## 2015-10-17 MED ORDER — LORAZEPAM 2 MG/ML IJ SOLN
0.5000 mg | Freq: Once | INTRAMUSCULAR | Status: AC
  Administered 2015-10-17: 0.5 mg via INTRAVENOUS
  Filled 2015-10-17: qty 1

## 2015-10-17 MED ORDER — PIPERACILLIN-TAZOBACTAM 3.375 G IVPB 30 MIN: 3.3750 g | Freq: Three times a day (TID) | INTRAVENOUS | Status: DC

## 2015-10-17 MED ORDER — ALBUTEROL SULFATE (2.5 MG/3ML) 0.083% IN NEBU
5.0000 mg | INHALATION_SOLUTION | Freq: Once | RESPIRATORY_TRACT | Status: AC
  Administered 2015-10-17: 5 mg via RESPIRATORY_TRACT
  Filled 2015-10-17: qty 6

## 2015-10-17 MED ORDER — PNEUMOCOCCAL VAC POLYVALENT 25 MCG/0.5ML IJ INJ
0.5000 mL | INJECTION | INTRAMUSCULAR | Status: AC
  Administered 2015-10-18: 0.5 mL via INTRAMUSCULAR
  Filled 2015-10-17: qty 0.5

## 2015-10-17 MED ORDER — BUDESONIDE-FORMOTEROL FUMARATE 160-4.5 MCG/ACT IN AERO
2.0000 | INHALATION_SPRAY | Freq: Two times a day (BID) | RESPIRATORY_TRACT | Status: DC
  Filled 2015-10-17: qty 6

## 2015-10-17 MED ORDER — IPRATROPIUM BROMIDE 0.02 % IN SOLN
0.5000 mg | Freq: Once | RESPIRATORY_TRACT | Status: AC
  Administered 2015-10-17: 0.5 mg via RESPIRATORY_TRACT
  Filled 2015-10-17: qty 2.5

## 2015-10-17 MED ORDER — ACETAMINOPHEN 325 MG PO TABS: 650.0000 mg | ORAL_TABLET | ORAL | Status: DC | PRN

## 2015-10-17 MED ORDER — CETYLPYRIDINIUM CHLORIDE 0.05 % MT LIQD: 7.0000 mL | Freq: Two times a day (BID) | OROMUCOSAL | Status: DC

## 2015-10-17 MED ORDER — SODIUM CHLORIDE 0.9 % IV SOLN
INTRAVENOUS | Status: DC
  Administered 2015-10-17: 23:00:00 via INTRAVENOUS
  Administered 2015-10-17: 1000 mL via INTRAVENOUS

## 2015-10-17 MED ORDER — INFLUENZA VAC SPLIT QUAD 0.5 ML IM SUSY
0.5000 mL | PREFILLED_SYRINGE | INTRAMUSCULAR | Status: AC
  Administered 2015-10-18: 0.5 mL via INTRAMUSCULAR
  Filled 2015-10-17: qty 0.5

## 2015-10-17 MED ORDER — HEPARIN SODIUM (PORCINE) 5000 UNIT/ML IJ SOLN
5000.0000 [IU] | Freq: Three times a day (TID) | INTRAMUSCULAR | Status: DC
  Administered 2015-10-18: 5000 [IU] via SUBCUTANEOUS
  Filled 2015-10-17 (×5): qty 1

## 2015-10-17 MED ORDER — VANCOMYCIN HCL IN DEXTROSE 1-5 GM/200ML-% IV SOLN
1000.0000 mg | Freq: Once | INTRAVENOUS | Status: DC
Start: 2015-10-17 — End: 2015-10-17

## 2015-10-17 MED ORDER — ONDANSETRON HCL 4 MG/2ML IJ SOLN
4.0000 mg | Freq: Four times a day (QID) | INTRAMUSCULAR | Status: DC | PRN
  Administered 2015-10-18: 4 mg via INTRAVENOUS
  Filled 2015-10-17: qty 2

## 2015-10-17 MED ORDER — PIPERACILLIN-TAZOBACTAM 3.375 G IVPB
3.3750 g | Freq: Three times a day (TID) | INTRAVENOUS | Status: DC
  Filled 2015-10-17 (×2): qty 50

## 2015-10-17 MED ORDER — IPRATROPIUM-ALBUTEROL 0.5-2.5 (3) MG/3ML IN SOLN: 3.0000 mL | Freq: Four times a day (QID) | RESPIRATORY_TRACT | Status: DC | PRN

## 2015-10-17 MED ORDER — FUROSEMIDE 10 MG/ML IJ SOLN
80.0000 mg | Freq: Two times a day (BID) | INTRAMUSCULAR | Status: DC
  Administered 2015-10-18: 80 mg via INTRAVENOUS
  Filled 2015-10-17: qty 8

## 2015-10-17 MED ORDER — PIPERACILLIN-TAZOBACTAM 3.375 G IVPB 30 MIN
3.3750 g | INTRAVENOUS | Status: AC
  Administered 2015-10-17: 3.375 g via INTRAVENOUS
  Filled 2015-10-17: qty 50

## 2015-10-17 MED ORDER — VANCOMYCIN HCL IN DEXTROSE 1-5 GM/200ML-% IV SOLN
1000.0000 mg | INTRAVENOUS | Status: AC
  Administered 2015-10-17: 1000 mg via INTRAVENOUS
  Filled 2015-10-17: qty 200

## 2015-10-17 MED ORDER — PREDNISONE 20 MG PO TABS
40.0000 mg | ORAL_TABLET | Freq: Every day | ORAL | Status: DC
  Administered 2015-10-18 – 2015-10-20 (×3): 40 mg via ORAL
  Filled 2015-10-17 (×5): qty 2

## 2015-10-17 NOTE — H&P (Signed)
Family Medicine Teaching [facility] Admission History and Physical Service Pager: [phone]  Patient name: Russell Hamilton Medical record number: [id number] Date of birth: [birth year] Age: 74 y.o. Gender: male  Primary Care Provider: Ambrose Finland, NP Consultants: None Code Status: Full  Chief Complaint: SOB  Assessment and Plan: Russell Hamilton is a 74 y.o. male presenting with SOB. 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   Dyspnea: Most likely multifactorial due to heart failure exacerbation, COPD exacerbation, coronary hypertension and anemia. EKG: Sinus tachycardia with inverted T waves the V4 - V6 , unchanged from previous and consistent with LV strain.  Chest x-ray consistent with interstitial edema and moderate bilateral pleural effusions. Less likely pneumonia (WBC wnl, afebrile).  Low suspicion for PE (Well's Score 1.5) - Admit to tele - Cycle troponins: Neg x 1 - Trend lactic acid: 4.84 > 2.55 - PT/OT consulted - Treating HF and COPD exacerbations as below  Cardiac: Acute heart failure exacerbation. HFrEF Echo 07/2015: EF 10-15%, diffuse hypokinesis, mild aortic stenosis.  Long history of heart failure noncompliance; not on Beta Blocker or ACE-I; Stopped on last discharge (09/27/15). BNP 1715 (admit)  - IV Lasix: 80 mg BID (12/12 >); Home Lasix  BID - Continue to monitor blood pressure and electrolytes with diuresis - Consider heart failure consult - Daily wt: 161lbs (admit); Dry wt unknown (? 150s based on last admission) - Strict I&O's - Tele; EKG in am- Concern for Afib - reports palpitations for several months  Resp: COPD exacerbation, pulmonary hypertension, current tobacco abuse.  - Continue [place] O2; wean as tolerated. Home O2 requirement 2L - f/u Blood and Urine Cultures: (Collected 12/12) - Continue home: Symbicort - Abx: Vanc/Zosyn (12/12 >>); Started in ED for presumptive HCAP - Narrow as able for COPD  exacerbation  - Prednisone  qd x 5 days (12/12>>); Last dose on 12/17 - Duonebs q6hrs prn   Neuro: Lewy body dementia; Hx of CVAs; Hx of Seizure.  Alert and oriented on admission.  Previously evaluated by psych during last admission and found to have capacity.  No new neurological symptoms.  Not on anticonvulsants; EEG last admit was normal  - Start ASA  qd - Consider Statin  Left elbow pain: Reports new left elbow pain for last 2 days.  Reports history of falls at home.  No obvious swelling or erythema.  Last admission, complained of right elbow pain - Continue home Roxicet: Inc freq to q4hrs prn. Home dose BID - Xray ordered - Consider Gout (uric acid 8.8 11/201/6): Start Colchicine (CrCl 57)  Bilateral Leg pain: Possibly due to neuropathy secondary to diabetes or malnutrition versus claudication (Current smoker; Weak pulses in feet) - Continue home gabapentin  BID; titrate up as needed - Discontinue Lyrica - ABI: ordered - Checking B12  CKD Stage 3b: (Cr baseline 1.4) - Not on ACE-I; noncompliance - Monitor creatinine with diuresis - Consider discontinuing PPI if not symptomatic for GERD  DM2: A1c 8.2 (07/13/15) - Holding Amaryl and Januvia; patient reports not taking anyway - SSI thin while inpatient; Monitor in setting of steroids  Anemia (Macrocytic): Hgb 8.7 (baseline 13, 06/2015) , MCV 104, RDW elevate - FOBT: ordered - Checking: Iron panel, Ferritin, B12, Folate - Transfusion threshold 7  Hypocalcemia: Ca 8.7.  Asymptomatic - Check CMET in am and correct Ca for albumin  Social: Lives alone; Supposed to have home hospice - Consult Case Management  - Call Hospice in  am  FEN/GI: Heart Health; SLIV Prophylaxis: Subq Hep  Disposition: Tele; discharge likely home with resumption of hospice  History of Present Illness:  Russell Hamilton is a 74 y.o. male presenting with shortness of breath.  He reports worsening shortness of breath over the past few days.   Additionally, reports worsening productive cough and sputum purulence.  Denies chest pain, diaphoresis.  Does endorse episodes of racing heart and palpitations in the past 6 months.  Reports compliance with home Lasix.  Lives alone but has home health hospice.  Reports occasional falls but denies any loss of consciousness, headache or neck pain.  Continues to smoke.  Reports bilateral leg pain from knees down.  Reports left elbow pain 2 days; denies any trauma initiating this.   Review Of Systems: Per HPI with the following additions:  Otherwise the remainder of the systems were negative.  Patient Active Problem List   Diagnosis Date Noted  . Palliative care encounter   . Observed seizure-like activity (HCC)   . 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 Personal history of noncompliance with medical treatment 09/21/2015  . Suicidal ideation   . CHF exacerbation (HCC) 09/20/2015  . Acute on chronic congestive heart failure (HCC)   . Chronic bronchitis (HCC)   . Secondary hypertension, unspecified   . Uncontrolled type 2 diabetes mellitus with ketoacidosis without coma, without long-term current use of insulin (HCC)   . Acute renal insufficiency 09/16/2015  . Renal insufficiency   . Sprain of left knee   . Fall at home   . Hypoxia   . Normochromic normocytic anemia   . Prolonged QT interval   . COPD exacerbation (HCC) 09/14/2015  . Chronic pain syndrome 09/08/2015  . Neutropenia (HCC) 07/13/2015  . CHF (congestive heart failure) (HCC) 07/13/2015  . Acute on chronic combined systolic and diastolic congestive heart failure (HCC) 06/12/2015  . Orthostatic hypotension 05/09/2015  . Tinea pedis 04/26/2015  . Benign paroxysmal positional vertigo 04/12/2015  . GERD (gastroesophageal reflux disease) 04/05/2015  . Dementia with Lewy bodies 04/05/2015  . COPD (chronic obstructive pulmonary disease) (HCC) 03/26/2015  . COPD with acute exacerbation (HCC) 03/26/2015  .  Chronic kidney disease, stage 3 03/01/2015  . Type 2 diabetes mellitus with diabetic chronic kidney disease (HCC)   . Unstable angina (HCC) 02/25/2015  . Congestive heart failure (HCC) 02/24/2015  . Cough, persistent 02/08/2015  . Tobacco abuse 02/08/2015  . Acute diastolic CHF (congestive heart failure) (HCC) 12/12/2014  . Acute systolic CHF (congestive heart failure) (HCC) 12/12/2014  . Acute CHF (HCC) 12/11/2014  . AKI (acute kidney injury) (HCC) 11/22/2014  . Hypokalemia 11/21/2014  . Elevated troponin 11/20/2014  . Acute on chronic combined systolic and diastolic heart failure (HCC) 11/20/2014  . Foot ulcer (HCC) 11/20/2014  . NSTEMI (non-ST elevated myocardial infarction) (HCC)   . Pleural effusion   . Acute respiratory failure with hypoxia (HCC)   . Protein-calorie malnutrition, severe (HCC) 08/13/2014  . Chronic systolic CHF (congestive heart failure) (HCC)   . Type II diabetes mellitus with renal manifestations, uncontrolled (HCC)   . Hypertension   . Stroke (HCC)   . Acute systolic CHF (congestive heart failure), NYHA class 4 (HCC) 08/12/2014  . History of stroke 08/12/2014  . HTN (hypertension) 08/12/2014  . Peripheral neuropathy (HCC) 08/12/2014    Past Medical History: Past Medical History  Diagnosis Date  . Hypertension   . Chronic systolic CHF (congestive heart failure) (HCC)     a.  2D ECHO 08/13/14: EF 40%, diffuse hypokinesis possibly worse in the inferior wall. Mild MR, mild LA dilation, PA pressure 50. Trivial pericardial effusion.  . Acute systolic CHF (congestive heart failure), NYHA class 4 (HCC) 08/12/2014  . Type II diabetes mellitus (HCC)   . GERD (gastroesophageal reflux disease)   . Peripheral neuropathy (HCC)     [name] 08/12/2014  . Stroke [facility]) 2010    "memory problems since; right leg and left arm weakness since" (02/08/2015)  . Ulcers of both lower extremities (HCC)     [name] 02/24/2015  . Noncompliance     [name] 02/24/2015  . COPD (chronic  obstructive pulmonary disease) (HCC)   . Asthma   . Renal insufficiency     Past Surgical History: Past Surgical History  Procedure Laterality Date  . Carotid endarterectomy Right 2010  . Hand ligament reconstruction Right ?1970's    "pinky"  . Foot surgery Left     debridement    2016    Social History: Social History  Substance Use Topics  . Smoking status: Current Every Day Smoker -- 0.50 packs/day for 56 years    Types: Cigarettes  . Smokeless tobacco: Never Used  . Alcohol Use: No     Comment: 02/08/2015 "I might have a beer q 6 months"   Please also refer to relevant sections of EMR.  Family History: Family History  Problem Relation Age of Onset  . Kidney disease Mother   . Heart attack Brother     X 2  . Cancer Brother    Allergies and Medications: No Known Allergies No current facility-administered medications on file prior to encounter.   Current Outpatient Prescriptions on File Prior to Encounter  Medication Sig Dispense Refill  . albuterol (PROVENTIL HFA;VENTOLIN HFA) 108 (90 BASE) MCG/ACT inhaler Inhale 2 puffs into the lungs every 6 (six) hours as needed for wheezing or shortness of breath. 1 Inhaler 1  . Fluticasone Furoate-Vilanterol (BREO ELLIPTA) 100-25 MCG/INH AEPB Inhale 1 puff into the lungs daily. 1 each 2  . furosemide (LASIX) 40 MG tablet Take 1.5 tablets (60 mg total) by mouth 2 (two) times daily. 90 tablet 1  . gabapentin (NEURONTIN) 300 MG capsule Take 600 mg by mouth 2 (two) times daily.    Marland Kitchen ipratropium (ATROVENT) 0.02 % nebulizer solution Take 2.5 mLs (0.5 mg total) by nebulization every 6 (six) hours as needed for wheezing or shortness of breath. 75 mL 6  . omeprazole (PRILOSEC) 20 MG capsule Take 1 capsule (20 mg total) by mouth daily. 30 capsule 2  . oxyCODONE-acetaminophen (ROXICET) 5-325 MG tablet Take 1 tablet by mouth every 12 (twelve) hours as needed for severe pain. 60 tablet 0  . pregabalin (LYRICA) 100 MG capsule Take 1 capsule (100  mg total) by mouth 2 (two) times daily. 60 capsule 1  . silver sulfADIAZINE (SILVADENE) 1 % cream Apply 1 application topically daily. 50 g 0  . glimepiride (AMARYL) 2 MG tablet Take 1 tablet (2 mg total) by mouth daily with breakfast. (Patient not taking: Reported on 09/14/2015) 30 tablet 2  . sitaGLIPtin (JANUVIA) 50 MG tablet Take 1 tablet (50 mg total) by mouth daily. For diabetes (Patient not taking: Reported on 09/14/2015) 30 tablet 2    Objective: BP 112/74 mmHg  Pulse 105  Temp(Src) 98.7 F (37.1 C) (Oral)  Resp 28  Ht  (1.753 m)  Wt 157 lb 3 oz (71.3 kg)  BMI 23.20 kg/m2  SpO2 100% Exam: General: NAD;  thin and frail  Eyes: PERRLA; EOMI ENTM: MMM Neck: Supple; No bruits Cardiovascular: Tachycardic; muffled heart sounds; no m/r/g Respiratory: Diffusely decreased breath sounds; expiratory wheeze and diffuse crackles.  Normal respiratory effort on 3L [place].  No rhonchi Abdomen: SNTND, BS + MSK: Left elbow tenderness; No swelling or erythema Skin: no rash Neuro: A&Ox4; CN 3-12 intact; gross motor and sensory intact Psych: Normal mood and affect  Labs and Imaging: CBC BMET   Recent Labs Lab 10/17/15 1525  WBC 10.2  HGB 8.7*  HCT 28.2*  PLT 218    Recent Labs Lab 10/17/15 1525  NA 138  K 4.4  CL 103  CO2 23  BUN 9  CREATININE 1.20  GLUCOSE 144*  CALCIUM 8.7*       Recent Labs Lab 10/17/15 1525  TROPONINI <0.03   BNP (last 3 results)  Recent Labs  09/14/15 1935 09/20/15 2110 10/17/15 1525  BNP 1230.1* 1284.0* 1715.9*   Lactic Acid, Venous: 4.84 > 2.55  Urinalysis    Component Value Date/Time   COLORURINE AMBER* 10/17/2015 2022   APPEARANCEUR CLOUDY* 10/17/2015 2022   LABSPEC 1.024 10/17/2015 2022   PHURINE 5.5 10/17/2015 2022   GLUCOSEU NEGATIVE 10/17/2015 2022   HGBUR NEGATIVE 10/17/2015 2022   BILIRUBINUR MODERATE* 10/17/2015 2022   KETONESUR NEGATIVE 10/17/2015 2022   PROTEINUR 100* 10/17/2015 2022   UROBILINOGEN 0.2 02/28/2015  0027   NITRITE POSITIVE* 10/17/2015 2022   LEUKOCYTESUR SMALL* 10/17/2015 2022   Dg Chest 2 View  10/17/2015  CLINICAL DATA:  Shortness of breath EXAM: CHEST  2 VIEW COMPARISON:  09/20/2015 FINDINGS: Low lung volumes with vascular crowding. Superimposed mild interstitial edema is suspected. Moderate bilateral pleural effusions. Associated bilateral lower lobe opacities, likely atelectasis. No pneumothorax. Cardiomegaly. IMPRESSION: Low lung volumes. Cardiomegaly with suspected mild interstitial edema. Moderate bilateral pleural effusions. Associated bilateral lower lobe opacities, likely atelectasis. Electronically Signed   By: Charline Bills M.D.   On: 10/17/2015 17:08    Jamal Collin, MD 10/17/2015, 11:09 PM PGY-3,  Family Medicine FPTS Intern pager: [phone], text pages welcome

## 2015-10-17 NOTE — ED Provider Notes (Addendum)
CSN: [id number]     Arrival date & time 10/17/15  1505 History   First MD Initiated Contact with Patient 10/17/15 1513     Chief Complaint  Patient presents with  . Shortness of Breath  . Arm Pain     (Consider location/radiation/quality/duration/timing/severity/associated sxs/prior Treatment) HPI Comments: Pt w/ h/o copd and on 3l o2 chronically Off oxygen x 12 hours by mistake and home Rn came to visit this am and turned o2 back on Still sob w/o chest pain/pressure--denies chf or angina sx Increased productive cough x 1 week w/o fever, chills, vomiting, diarrhea Sx gradually worse today No new tx used pta  Patient is a 74 y.o. male presenting with shortness of breath and arm pain. The history is provided by the patient.  Shortness of Breath Severity:  Moderate Onset quality:  Gradual Duration:  1 day Timing:  Constant Progression:  Worsening Chronicity:  Recurrent Relieved by:  Nothing Worsened by:  Nothing tried Ineffective treatments:  None tried Arm Pain Associated symptoms include shortness of breath.    Past Medical History  Diagnosis Date  . Hypertension   . Chronic systolic CHF (congestive heart failure) (HCC)     a. 2D ECHO 08/13/14: EF 40%, diffuse hypokinesis possibly worse in the inferior wall. Mild MR, mild LA dilation, PA pressure 50. Trivial pericardial effusion.  . Acute systolic CHF (congestive heart failure), NYHA class 4 (HCC) 08/12/2014  . Type II diabetes mellitus (HCC)   . GERD (gastroesophageal reflux disease)   . Peripheral neuropathy (HCC)     [name]/notes 08/12/2014  . Stroke [facility](HCC) 2010    "memory problems since; right leg and left arm weakness since" (02/08/2015)  . Ulcers of both lower extremities (HCC)     [name]/notes 02/24/2015  . Noncompliance     [name]/notes 02/24/2015  . COPD (chronic obstructive pulmonary disease) (HCC)   . Asthma   . Renal insufficiency    Past Surgical History  Procedure Laterality Date  . Carotid endarterectomy Right 2010  .  Hand ligament reconstruction Right ?1970's    "pinky"  . Foot surgery Left     debridement    2016   Family History  Problem Relation Age of Onset  . Kidney disease Mother   . Heart attack Brother     X 2  . Cancer Brother    Social History  Substance Use Topics  . Smoking status: Current Every Day Smoker -- 0.50 packs/day for 56 years    Types: Cigarettes  . Smokeless tobacco: Never Used  . Alcohol Use: No     Comment: 02/08/2015 "I might have a beer q 6 months"    Review of Systems  Respiratory: Positive for shortness of breath.   All other systems reviewed and are negative.     Allergies  Review of patient's allergies indicates no known allergies.  Home Medications   Prior to Admission medications   Medication Sig Start Date End Date Taking? Authorizing Provider  albuterol (PROVENTIL HFA;VENTOLIN HFA) 108 (90 BASE) MCG/ACT inhaler Inhale 2 puffs into the lungs every 6 (six) hours as needed for wheezing or shortness of breath. 08/17/15   Jaclyn ShaggyEnobong Amao, MD  Fluticasone Furoate-Vilanterol (BREO ELLIPTA) 100-25 MCG/INH AEPB Inhale 1 puff into the lungs daily. 09/07/15   Jaclyn ShaggyEnobong Amao, MD  furosemide (LASIX) 40 MG tablet Take 1.5 tablets (60 mg total) by mouth 2 (two) times daily. 09/07/15   Jaclyn ShaggyEnobong Amao, MD  gabapentin (NEURONTIN) 300 MG capsule Take 600 mg by mouth  2 (two) times daily.    Historical Provider, MD  glimepiride (AMARYL) 2 MG tablet Take 1 tablet (2 mg total) by mouth daily with breakfast. Patient not taking: Reported on 09/14/2015 07/04/15   Jaclyn Shaggy, MD  ipratropium (ATROVENT) 0.02 % nebulizer solution Take 2.5 mLs (0.5 mg total) by nebulization once. Patient not taking: Reported on 09/14/2015 09/07/15   Jaclyn Shaggy, MD  ipratropium (ATROVENT) 0.02 % nebulizer solution Take 2.5 mLs (0.5 mg total) by nebulization every 6 (six) hours as needed for wheezing or shortness of breath. 10/04/15   Jaclyn Shaggy, MD  omeprazole (PRILOSEC) 20 MG capsule Take 1 capsule (20 mg  total) by mouth daily. 09/12/15   Jaclyn Shaggy, MD  oxyCODONE-acetaminophen (ROXICET) 5-325 MG tablet Take 1 tablet by mouth every 12 (twelve) hours as needed for severe pain. 09/07/15   Jaclyn Shaggy, MD  pregabalin (LYRICA) 100 MG capsule Take 1 capsule (100 mg total) by mouth 2 (two) times daily. 09/07/15   Jaclyn Shaggy, MD  silver sulfADIAZINE (SILVADENE) 1 % cream Apply 1 application topically daily. Patient not taking: Reported on 09/14/2015 06/20/15   Vivi Barrack, DPM  sitaGLIPtin (JANUVIA) 50 MG tablet Take 1 tablet (50 mg total) by mouth daily. For diabetes Patient not taking: Reported on 09/14/2015 07/04/15   Jaclyn Shaggy, MD   BP 110/74 mmHg  Pulse 115  Temp(Src) 98.2 F (36.8 C) (Oral)  Resp 25  SpO2 92% Physical Exam  Constitutional: He is oriented to person, place, and time. He appears cachectic. He has a sickly appearance. He appears ill.  HENT:  Head: Normocephalic and atraumatic.  Eyes: Conjunctivae, EOM and lids are normal. Pupils are equal, round, and reactive to light.  Neck: Normal range of motion. Neck supple. No tracheal deviation present. No thyroid mass present.  Cardiovascular: Regular rhythm and normal heart sounds.  Tachycardia present.  Exam reveals no gallop.   No murmur heard. Pulmonary/Chest: Effort normal. No stridor. No respiratory distress. He has decreased breath sounds. He has wheezes. He has no rhonchi. He has no rales.  Abdominal: Soft. Normal appearance and bowel sounds are normal. He exhibits no distension. There is no tenderness. There is no rebound and no CVA tenderness.  Musculoskeletal: Normal range of motion. He exhibits no edema or tenderness.  Neurological: He is alert and oriented to person, place, and time. He has normal strength. No cranial nerve deficit or sensory deficit. GCS eye subscore is 4. GCS verbal subscore is 5. GCS motor subscore is 6.  Skin: Skin is warm and dry. No abrasion and no rash noted.  Psychiatric: His behavior is  normal. His mood appears anxious. His speech is rapid and/or pressured.  Nursing note and vitals reviewed.   ED Course  Procedures (including critical care time) Labs Review Labs Reviewed  CULTURE, BLOOD (ROUTINE X 2)  CULTURE, BLOOD (ROUTINE X 2)  BRAIN NATRIURETIC PEPTIDE  TROPONIN I  CBC WITH DIFFERENTIAL/PLATELET  BASIC METABOLIC PANEL  I-STAT CG4 LACTIC ACID, ED    Imaging Review No results found. I have personally reviewed and evaluated these images and lab results as part of my medical decision-making.   EKG Interpretation   Date/Time:  Monday October 17 2015 15:17:54 EST Ventricular Rate:  117 PR Interval:  144 QRS Duration: 105 QT Interval:  348 QTC Calculation: 485 R Axis:   -16 Text Interpretation:  Sinus tachycardia Multiform ventricular premature  complexes Probable left atrial enlargement Borderline left axis deviation  Anteroseptal infarct, old No significant change  since last tracing  Confirmed by Skylin Kennerson  MD, Marvie Calender ([id number]) on 10/17/2015 4:39:02 PM      MDM   Final diagnoses:  SOB (shortness of breath)    Patient given Lasix IV push for CHF and also started on antibiotics for suspected healthcare associated pneumonia. Will repeat patient's lactate here. Patient maintaining his airway. Also treated for COPD. Will be [facility] the [facility] to the family practice service  CRITICAL CARE Performed by: Toy Baker Total critical care time: 60 minutes Critical care time was exclusive of separately billable procedures and treating other patients. Critical care was necessary to treat or prevent imminent or life-threatening deterioration. Critical care was time spent personally by me on the following activities: development of treatment plan with patient and/or surrogate as well as nursing, discussions with consultants, evaluation of patient's response to treatment, examination of patient, obtaining history from patient or surrogate, ordering  and performing treatments and interventions, ordering and review of laboratory studies, ordering and review of radiographic studies, pulse oximetry and re-evaluation of patient's condition.  Lorre Nick, MD 10/17/15 Paulo Fruit  Lorre Nick, MD 10/17/15 [id number]

## 2015-10-17 NOTE — ED Notes (Signed)
Bed: [id number] Expected date:  Expected time:  Means of arrival:  Comments: Ems- shob

## 2015-10-17 NOTE — Progress Notes (Signed)
ANTIBIOTIC CONSULT NOTE - INITIAL  Pharmacy Consult for Vancomycin, Zosyn Indication: pneumonia  No Known Allergies  Patient Measurements:   Weight 73.3 kg as of 09/27/15  Vital Signs: Temp: 97.8 F (36.6 C) (12/12 1752) Temp Source: Oral (12/12 1752) BP: 99/57 mmHg (12/12 1752) Pulse Rate: 111 (12/12 1752) Intake/Output from previous day:   Intake/Output from this shift:    Labs:  Recent Labs  10/17/15 1525  WBC 10.2  HGB 8.7*  PLT 218  CREATININE 1.20   CrCl cannot be calculated (Unknown ideal weight.). No results for input(s): VANCOTROUGH, VANCOPEAK, VANCORANDOM, GENTTROUGH, GENTPEAK, GENTRANDOM, TOBRATROUGH, TOBRAPEAK, TOBRARND, AMIKACINPEAK, AMIKACINTROU, AMIKACIN in the last 72 hours.   Microbiology: No results found for this or any previous visit (from the past 720 hour(s)).  Medical History: Past Medical History  Diagnosis Date  . Hypertension   . Chronic systolic CHF (congestive heart failure) (HCC)     a. 2D ECHO 08/13/14: EF 40%, diffuse hypokinesis possibly worse in the inferior wall. Mild MR, mild LA dilation, PA pressure 50. Trivial pericardial effusion.  . Acute systolic CHF (congestive heart failure), NYHA class 4 (HCC) 08/12/2014  . Type II diabetes mellitus (HCC)   . GERD (gastroesophageal reflux disease)   . Peripheral neuropathy (HCC)     [name]/notes 08/12/2014  . Stroke [facility](HCC) 2010    "memory problems since; right leg and left arm weakness since" (02/08/2015)  . Ulcers of both lower extremities (HCC)     [name]/notes 02/24/2015  . Noncompliance     [name]/notes 02/24/2015  . COPD (chronic obstructive pulmonary disease) (HCC)   . Asthma   . Renal insufficiency     Assessment: [age] yoM PMH of HFrEF (EF 10-15%)), T2DM, COPD, HTN, Hx CVA, tobacco abuse, hx NSTEMI, CKD stage III who is O2 dependent presents with respiratory distress and arm pain.  Pharmacy consulted to dose vancomycin and zosyn for possible pneumonia.  Vanc 1g x 1 and Zosyn 3.375g x 1 ordered in ED  already.    Today, 10/17/2015: - WBC 10.2 - Afebrile - SCr 1.20, CrCl~57 ml/min  Antimicrobials this admission: 12/12 Vanc >>  12/12 Zosyn >>   Microbiology Results: 12/12 BCx: collected 12/12 UCx: ordered   Goal of Therapy:  Vancomycin trough level 15-20 mcg/ml  Doses adjusted per renal function Eradication of infection  Plan:  1.  Vancomycin 750 mg IV q12h. 2.  Zosyn 3.375g IV q8h (4 hour infusion time).  3.  F/u SCr, trough levels as indicated, culture results.  Clance Bollunyon, Matson Welch 10/17/2015,6:34 PM

## 2015-10-17 NOTE — Progress Notes (Signed)
Patient admitted to 2H25, paged family teaching resident to inform patient arrived to unit and that patient is currently complaining of pain in right arm and left leg.  Patient stated that pain began 2 days ago but then stated pain has been occurring for months, informed MD of patients pain complaint. Ivery QualeJackson, Deissy Guilbert A, RN

## 2015-10-17 NOTE — ED Notes (Signed)
Per EMS, pt O2 was turned off last night. Nurse came in this morning, found with his O2 off and put it back on. Pt gave himself a breathing treatment at some point this morning. Complaining of elbow pain and feeling SOB. Pt appears very anxious in triage. Per EMS, pt has extremely dark urine that patient states has been like that over the last two weeks. Pt from home, on hospice but is also a full code.

## 2015-10-17 NOTE — ED Notes (Signed)
Refused  rectal temp 

## 2015-10-17 NOTE — ED Notes (Addendum)
RN and EDP made aware of critical result 

## 2015-10-17 NOTE — Telephone Encounter (Signed)
Voice mail message received from Eric FormRene Smith, Hospice Nurse, # [phone] noting that she went to see the patient today and he was in acute respiratory distress when she arrived at his home.  The message stated that his O2 sat was 61 and his O2 tubing was attached to his nebulizer.  He had been without O2 for 6 hours and she had to send him to the hospital.  This CM returned the call to R. Katrinka BlazingSmith who stated that she was not able to speak at this time as she was with another patient.

## 2015-10-17 NOTE — ED Notes (Signed)
RN at bedside

## 2015-10-18 ENCOUNTER — Inpatient Hospital Stay (HOSPITAL_COMMUNITY)

## 2015-10-18 DIAGNOSIS — I5021 Acute systolic (congestive) heart failure: Secondary | ICD-10-CM

## 2015-10-18 DIAGNOSIS — F0390 Unspecified dementia without behavioral disturbance: Secondary | ICD-10-CM | POA: Diagnosis present

## 2015-10-18 LAB — FOLATE: Folate: 14.3 ng/mL (ref >5.9)

## 2015-10-18 LAB — CBC WITH DIFFERENTIAL/PLATELET
BAND NEUTROPHILS: 0 %
BASOS ABS: 0 10*3/uL (ref 0.0–0.1)
BASOS ABS: 0 10*3/uL (ref 0.0–0.1)
BASOS PCT: 0 %
BLASTS: 11 %
Band Neutrophils: 0 %
Basophils Relative: 0 %
Blasts: 10 %
EOS ABS: 0 10*3/uL (ref 0.0–0.7)
EOS ABS: 0 10*3/uL (ref 0.0–0.7)
EOS PCT: 0 %
Eosinophils Relative: 0 %
HCT: 25.1 % — ABNORMAL LOW (ref 39.0–52.0)
HEMATOCRIT: 28.2 % — AB (ref 39.0–52.0)
HEMOGLOBIN: 8.7 g/dL — AB (ref 13.0–17.0)
Hemoglobin: 7.7 g/dL — ABNORMAL LOW (ref 13.0–17.0)
LYMPHS PCT: 13 %
LYMPHS PCT: 8 %
Lymphs Abs: 1.3 10*3/uL (ref 0.7–4.0)
Lymphs Abs: 0.8 10*3/uL (ref 0.7–4.0)
MCH: 32.2 pg (ref 26.0–34.0)
MCH: 32 pg (ref 26.0–34.0)
MCHC: 30.9 g/dL (ref 30.0–36.0)
MCHC: 30.7 g/dL (ref 30.0–36.0)
MCV: 104.4 fL — ABNORMAL HIGH (ref 78.0–100.0)
MCV: 104.1 fL — ABNORMAL HIGH (ref 78.0–100.0)
METAMYELOCYTES PCT: 0 %
MONO ABS: 5.4 10*3/uL — AB (ref 0.1–1.0)
MONOS PCT: 36 %
MYELOCYTES: 0 %
Metamyelocytes Relative: 0 %
Monocytes Absolute: 3.7 10*3/uL — ABNORMAL HIGH (ref 0.1–1.0)
Monocytes Relative: 52 %
Myelocytes: 0 %
Neutro Abs: 4.2 10*3/uL (ref 1.7–7.7)
Neutro Abs: 3 10*3/uL (ref 1.7–7.7)
Neutrophils Relative %: 41 %
Neutrophils Relative %: 29 %
Other: 0 %
Other: 0 %
PLATELETS: 202 10*3/uL (ref 150–400)
PROMYELOCYTES ABS: 0 %
Platelets: 218 10*3/uL (ref 150–400)
Promyelocytes Absolute: 0 %
RBC: 2.7 MIL/uL — AB (ref 4.22–5.81)
RBC: 2.41 MIL/uL — ABNORMAL LOW (ref 4.22–5.81)
RDW: 21.4 % — ABNORMAL HIGH (ref 11.5–15.5)
RDW: 21.7 % — AB (ref 11.5–15.5)
WBC: 10.2 10*3/uL (ref 4.0–10.5)
WBC: 10.3 10*3/uL (ref 4.0–10.5)
nRBC: 0 /100 WBC
nRBC: 3 /100 WBC — ABNORMAL HIGH

## 2015-10-18 LAB — COMPREHENSIVE METABOLIC PANEL
ALBUMIN: 2.7 g/dL — AB (ref 3.5–5.0)
ALT: 9 U/L — ABNORMAL LOW (ref 17–63)
ANION GAP: 8 (ref 5–15)
AST: 14 U/L — ABNORMAL LOW (ref 15–41)
Alkaline Phosphatase: 110 U/L (ref 38–126)
BILIRUBIN TOTAL: 2.4 mg/dL — AB (ref 0.3–1.2)
BUN: 9 mg/dL (ref 6–20)
CALCIUM: 8.3 mg/dL — AB (ref 8.9–10.3)
CO2: 27 mmol/L (ref 22–32)
CREATININE: 1.46 mg/dL — AB (ref 0.61–1.24)
Chloride: 101 mmol/L (ref 101–111)
GFR calc Af Amer: 54 mL/min — ABNORMAL LOW (ref >60)
GFR calc non Af Amer: 46 mL/min — ABNORMAL LOW (ref >60)
GLUCOSE: 223 mg/dL — AB (ref 65–99)
POTASSIUM: 4.4 mmol/L (ref 3.5–5.1)
SODIUM: 136 mmol/L (ref 135–145)
TOTAL PROTEIN: 6.7 g/dL (ref 6.5–8.1)

## 2015-10-18 LAB — LACTIC ACID, PLASMA
LACTIC ACID, VENOUS: 2.4 mmol/L — AB (ref 0.5–2.0)
LACTIC ACID, VENOUS: 2.5 mmol/L — AB (ref 0.5–2.0)

## 2015-10-18 LAB — TROPONIN I
TROPONIN I: 0.06 ng/mL — AB (ref <0.031)
TROPONIN I: 0.06 ng/mL — AB (ref <0.031)

## 2015-10-18 LAB — IRON AND TIBC
IRON: 69 ug/dL (ref 45–182)
Saturation Ratios: 32 % (ref 17.9–39.5)
TIBC: 213 ug/dL — AB (ref 250–450)
UIBC: 144 ug/dL

## 2015-10-18 LAB — GLUCOSE, CAPILLARY
GLUCOSE-CAPILLARY: 260 mg/dL — AB (ref 65–99)
GLUCOSE-CAPILLARY: 248 mg/dL — AB (ref 65–99)
Glucose-Capillary: 256 mg/dL — ABNORMAL HIGH (ref 65–99)

## 2015-10-18 LAB — VITAMIN B12: VITAMIN B 12: 2520 pg/mL — AB (ref 180–914)

## 2015-10-18 LAB — PATHOLOGIST SMEAR REVIEW

## 2015-10-18 LAB — FERRITIN: Ferritin: 225 ng/mL (ref 24–336)

## 2015-10-18 MED ORDER — COLCHICINE 0.6 MG PO TABS: 0.6000 mg | ORAL_TABLET | Freq: Every day | ORAL | Status: DC

## 2015-10-18 MED ORDER — FUROSEMIDE 10 MG/ML IJ SOLN
80.0000 mg | Freq: Every day | INTRAMUSCULAR | Status: DC
  Administered 2015-10-19: 80 mg via INTRAVENOUS
  Filled 2015-10-18: qty 8

## 2015-10-18 MED ORDER — FENTANYL CITRATE (PF) 100 MCG/2ML IJ SOLN
50.0000 ug | Freq: Once | INTRAMUSCULAR | Status: AC
Start: 2015-10-18 — End: 2015-10-18
  Administered 2015-10-18: 50 ug via INTRAVENOUS

## 2015-10-18 MED ORDER — INSULIN ASPART 100 UNIT/ML ~~LOC~~ SOLN
0.0000 [IU] | Freq: Three times a day (TID) | SUBCUTANEOUS | Status: DC
  Administered 2015-10-18: 5 [IU] via SUBCUTANEOUS

## 2015-10-18 MED ORDER — OXYCODONE-ACETAMINOPHEN 5-325 MG PO TABS
1.0000 | ORAL_TABLET | ORAL | Status: DC | PRN
  Administered 2015-10-18: 1 via ORAL
  Filled 2015-10-18: qty 1

## 2015-10-18 MED ORDER — INSULIN ASPART 100 UNIT/ML ~~LOC~~ SOLN
0.0000 [IU] | Freq: Three times a day (TID) | SUBCUTANEOUS | Status: DC
  Administered 2015-10-18: 8 [IU] via SUBCUTANEOUS
  Administered 2015-10-18 – 2015-10-19 (×3): 5 [IU] via SUBCUTANEOUS
  Administered 2015-10-19: 3 [IU] via SUBCUTANEOUS

## 2015-10-18 MED ORDER — LEVOFLOXACIN 500 MG PO TABS
500.0000 mg | ORAL_TABLET | Freq: Every day | ORAL | Status: DC
  Administered 2015-10-18 – 2015-10-19 (×2): 500 mg via ORAL
  Filled 2015-10-18 (×4): qty 1

## 2015-10-18 MED ORDER — FENTANYL CITRATE (PF) 100 MCG/2ML IJ SOLN
INTRAMUSCULAR | Status: AC
  Administered 2015-10-18: 50 ug via INTRAVENOUS
  Filled 2015-10-18: qty 2

## 2015-10-18 MED ORDER — OXYMETAZOLINE HCL 0.05 % NA SOLN
1.0000 | Freq: Two times a day (BID) | NASAL | Status: DC | PRN
  Filled 2015-10-18: qty 15

## 2015-10-18 MED ORDER — COLCHICINE 0.6 MG PO TABS
1.2000 mg | ORAL_TABLET | Freq: Once | ORAL | Status: AC
  Administered 2015-10-18: 1.2 mg via ORAL
  Filled 2015-10-18: qty 2

## 2015-10-18 MED ORDER — COLCHICINE 0.6 MG PO TABS
0.6000 mg | ORAL_TABLET | Freq: Every day | ORAL | Status: DC
  Administered 2015-10-18 – 2015-10-19 (×2): 0.6 mg via ORAL
  Filled 2015-10-18 (×4): qty 1

## 2015-10-18 NOTE — Progress Notes (Signed)
Family Medicine Teaching Service Daily Progress Note Intern Pager: [phone]  Patient name: Russell Hamilton Schurman Medical record number: [id number] Date of birth: 02/28/1941 Age: 74 y.o. Gender: male  Primary Care Provider: Ambrose FinlandValerie A Keck, NP Consultants: None Code Status: Full  Pt Overview and Major Events to Date:  12/12 - Admitted for SOB  Assessment and Plan: Russell Hamilton Oftedahl is a 74 y.o. male presenting with SOB. 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   Dyspnea: Most likely multifactorial due to heart failure exacerbation, COPD exacerbation, coronary hypertension and anemia. EKG: Sinus tachycardia with inverted T waves the V4 - V6 , unchanged from previous and consistent with LV strain. Chest x-ray consistent with interstitial edema and moderate bilateral pleural effusions. Less likely pneumonia (WBC wnl, afebrile).Low suspicion for PE (Well's Score 1.5) - Cycle troponins: Neg x 1, 0.06 x2 - Trend lactic acid: 4.84 > 2.55>2.4>2.5 - will likely stay elevated with end-stage CHF - PT/OT consulted - Treating HF and COPD exacerbations as below  Cardiac: Acute heart failure exacerbation. HFrEF Echo 07/2015: EF 10-15%, diffuse hypokinesis, mild aortic stenosis. Long history of heart failure noncompliance; not on Beta Blocker or ACE-I; Stopped on last discharge (09/27/15). BNP 1715 (admit). - IV Lasix: 80 mg BID (12/12 >12/13); Home Lasix 60mg  BID   -Due to soft pressures and worsening Cr will reduce to Lasix IV 80mg  daily; re-access daily - Continue to monitor blood pressure and electrolytes with diuresis - Consider heart failure consult; holding off for now - Daily wt: 161lbs (admit)>149lbs; Dry wt unknown (? 150s based on last admission) - Strict I&O's  Resp: COPD exacerbation, pulmonary hypertension, current tobacco abuse.  - Continue [place] O2; wean as tolerated. Home O2 requirement 2L. Currently on 4L - f/u Blood and Urine Cultures:  (Collected 12/12) - Continue home: Symbicort - Abx: Vanc/Zosyn (12/12 >>12/13) discontinued; Started in ED for presumptive HCAP  - Narrow for COPD exacerbation to  Levaquin 500mg  Daily for 7 days (will cover PNA and COPD) - Prednisone 40mg  qd x 5 days (12/12>>); Last dose on 12/17 - Duonebs q6hrs prn   Neuro: Lewy body dementia; Hx of CVAs; Hx of Seizure. Alert and oriented on admission. Previously evaluated by psych during last admission and found to have capacity. No new neurological symptoms. Not on anticonvulsants; EEG last admit was normal. - Start ASA 81mg  qd - Consider Statin  Left elbow pain: Reports new left elbow pain for last 2 days. Reports history of falls at home. No obvious erythema; mild swelling. Tender to palpation. Last admission, complained of right elbow pain.  - Continue home Roxicet: Inc freq to q4hrs prn. Home dose BID - Xray ordered - Consider Gout (uric acid 8.8 11/201/6): Start Colchicine (CrCl 57)  Bilateral Leg pain: Possibly due to neuropathy secondary to diabetes or malnutrition versus claudication (Current smoker; Weak pulses in feet) - Continue home gabapentin 600mg  BID; titrate up as needed - Discontinue Lyrica - ABI: ordered - B12 level was elevated  CKD Stage 3b: (Cr baseline 1.4). On admission was 1.2 and now 1.46 this morning.  - Not on ACE-I; noncompliance - Monitor creatinine with diuresis; decreasing diuresis today - Consider discontinuing PPI if not symptomatic for GERD  DM2: A1c 8.2 (07/13/15). Hyperglycemia in setting of steriods. CBGs ranging 140-220 - Holding Amaryl and Januvia; patient reports not taking anyway - increased to moderate SSI - Monitor in setting of steroids  Anemia (Macrocytic): Hbg 7.7 today (baseline 13, 06/2015) ,  MCV 104, RDW elevate. Patient denying any blood loss.  - FOBT: ordered - Iron panel, Ferritin, Folate normal; B12 elevated - Transfusion threshold 7  Hypocalcemia: Ca 8.3 this morning. Asymptomatic.  Corrected Ca 9.3. - Continue to monitor  Social: Lives alone; niece checks on him and has [facility] hospice nurse come out 2x a week. - Consult Case Management   FEN/GI: Heart Health; SLIV Prophylaxis: Subq Hep  Disposition: Home pending improvement  Subjective:  Doing well this morning. States that his dyspnea is about the same. Denies chest pain. Continues to endorse elft elbow pain. Requesting Afrin for nose.   Objective: Temp:  [97.8 F (36.6 C)-98.7 F (37.1 C)] 98.4 F (36.9 C) (12/13 0312) Pulse Rate:  [65-116] 87 (12/13 0700) Resp:  [14-37] 14 (12/13 0700) BP: (92-138)/(54-89) 113/66 mmHg (12/13 0700) SpO2:  [89 %-100 %] 100 % (12/13 0700) FiO2 (%):  [3 %] 3 % (12/12 1532) Weight:  [149 lb 4 oz (67.7 kg)-161 lb (73.029 kg)] 149 lb 4 oz (67.7 kg) (12/13 0533) Physical Exam: General: NAD; thin and frail  Eyes: PERRLA; EOMI ENTM: MMM Neck: Supple; No bruits Cardiovascular: Tachycardic; muffled heart sounds; no m/r/g Respiratory: Diffusely decreased breath sounds; expiratory wheeze and diffuse crackles. Normal respiratory effort on 3L [place]. No rhonchi Abdomen: SNTND, BS + MSK: Left elbow tenderness; No swelling or erythema Skin: no rash Neuro: A&Ox4; CN 3-12 intact; gross motor and sensory intact Psych: Normal mood and affect  Laboratory:  Recent Labs Lab 10/17/15 1525 10/18/15 0315  WBC 10.2 10.3  HGB 8.7* 7.7*  HCT 28.2* 25.1*  PLT 218 202    Recent Labs Lab 10/17/15 1525 10/18/15 0315  NA 138 136  K 4.4 4.4  CL 103 101  CO2 23 27  BUN 9 9  CREATININE 1.20 1.46*  CALCIUM 8.7* 8.3*  PROT  --  6.7  BILITOT  --  2.4*  ALKPHOS  --  110  ALT  --  9*  AST  --  14*  GLUCOSE 144* 223*   Trop neg>0.06 x2 BNP 1715.9 Lactic Acid - 4.84>2.55>2.4>2.5  BCx and UCx pending  Imaging/Diagnostic Tests: Dg Chest 2 View  10/17/2015  CLINICAL DATA:  Shortness of breath EXAM: CHEST  2 VIEW COMPARISON:  09/20/2015 FINDINGS: Low lung volumes with vascular  crowding. Superimposed mild interstitial edema is suspected. Moderate bilateral pleural effusions. Associated bilateral lower lobe opacities, likely atelectasis. No pneumothorax. Cardiomegaly. IMPRESSION: Low lung volumes. Cardiomegaly with suspected mild interstitial edema. Moderate bilateral pleural effusions. Associated bilateral lower lobe opacities, likely atelectasis. Electronically Signed   By: Charline Bills M.D.   On: 10/17/2015 17:08   Pincus Large, DO 10/18/2015, 7:59 AM PGY-2, [place] [facility] FPTS Intern pager: [phone], text pages welcome

## 2015-10-18 NOTE — Hospital Discharge Follow-Up (Signed)
Attempted to meet with the patient but he was to sleepy to talk.  [facility] and [facility] [place].   Update provided to Junius Creamerebbie Dowell, RN CM  Will continue to follow his hospitalization.

## 2015-10-18 NOTE — Consult Note (Signed)
Reason for Consult:   CHF  Requesting Physician: [facility] Primary Cardiologist Dr Nelson/ Dr Daleen Squibb  HPI:   74 y.o.AA male with past medical history of chronic systolic CHF, EF 69-62% by echo Sept 2016, (pt has declined cath in the past), Type 2 DM, GERD, CVA, COPD, HTN, Stage 3 CKD, Lewy Body Dementia, and recurrent medication noncompliance who presented to [facility] ED 10/17/15 with CHF. He has had several admission since Jan 2016 with CHF secondary to non compliance. At one point last month he was seen by Hospice and made a NCB. He was discharged most recently 09/27/15. He is admitted now with SOB. He told me he hasn't been taking his medications and "I just need a couple of days in the hospital". On admission CXR confirms CHF, BNP 1700, Troponin 0.06. When I saw the pt he was not very verbal. He was in no distress.   PMHx:  Past Medical History  Diagnosis Date  . Hypertension   . Chronic systolic CHF (congestive heart failure) (HCC)     a. 2D ECHO 08/13/14: EF 40%, diffuse hypokinesis possibly worse in the inferior wall. Mild MR, mild LA dilation, PA pressure 50. Trivial pericardial effusion.  . Acute systolic CHF (congestive heart failure), NYHA class 4 (HCC) 08/12/2014  . Type II diabetes mellitus (HCC)   . GERD (gastroesophageal reflux disease)   . Peripheral neuropathy (HCC)     [name] 08/12/2014  . Stroke [facility]) 2010    "memory problems since; right leg and left arm weakness since" (02/08/2015)  . Ulcers of both lower extremities (HCC)     [name] 02/24/2015  . Noncompliance     [name] 02/24/2015  . COPD (chronic obstructive pulmonary disease) (HCC)   . Asthma   . Renal insufficiency     Past Surgical History  Procedure Laterality Date  . Carotid endarterectomy Right 2010  . Hand ligament reconstruction Right ?1970's    "pinky"  . Foot surgery Left     debridement    2016    SOCHx:  reports that he has been smoking Cigarettes.  He has a 28 pack-year  smoking history. He has never used smokeless tobacco. He reports that he uses illicit drugs (Marijuana). He reports that he does not drink alcohol.  FAMHx: Family History  Problem Relation Age of Onset  . Kidney disease Mother   . Heart attack Brother     X 2  . Cancer Brother     ALLERGIES: No Known Allergies  ROS: Review of Systems: Unobtainable due to pt factors  HOME MEDICATIONS: Prior to Admission medications   Medication Sig Start Date End Date Taking? Authorizing Provider  albuterol (PROVENTIL HFA;VENTOLIN HFA) 108 (90 BASE) MCG/ACT inhaler Inhale 2 puffs into the lungs every 6 (six) hours as needed for wheezing or shortness of breath. 08/17/15  Yes Jaclyn Shaggy, MD  Fluticasone Furoate-Vilanterol (BREO ELLIPTA) 100-25 MCG/INH AEPB Inhale 1 puff into the lungs daily. 09/07/15  Yes Jaclyn Shaggy, MD  furosemide (LASIX) 40 MG tablet Take 1.5 tablets (60 mg total) by mouth 2 (two) times daily. 09/07/15  Yes Jaclyn Shaggy, MD  gabapentin (NEURONTIN) 300 MG capsule Take 600 mg by mouth 2 (two) times daily.   Yes Historical Provider, MD  ipratropium (ATROVENT) 0.02 % nebulizer solution Take 2.5 mLs (0.5 mg total) by nebulization every 6 (six) hours as needed for wheezing or shortness of breath. 10/04/15  Yes Jaclyn Shaggy, MD  omeprazole (PRILOSEC)  20 MG capsule Take 1 capsule (20 mg total) by mouth daily. 09/12/15  Yes Jaclyn Shaggy, MD  oxyCODONE-acetaminophen (ROXICET) 5-325 MG tablet Take 1 tablet by mouth every 12 (twelve) hours as needed for severe pain. 09/07/15  Yes Jaclyn Shaggy, MD  pregabalin (LYRICA) 100 MG capsule Take 1 capsule (100 mg total) by mouth 2 (two) times daily. 09/07/15  Yes Jaclyn Shaggy, MD  silver sulfADIAZINE (SILVADENE) 1 % cream Apply 1 application topically daily. 06/20/15  Yes Vivi Barrack, DPM  glimepiride (AMARYL) 2 MG tablet Take 1 tablet (2 mg total) by mouth daily with breakfast. Patient not taking: Reported on 09/14/2015 07/04/15   Jaclyn Shaggy, MD    sitaGLIPtin (JANUVIA) 50 MG tablet Take 1 tablet (50 mg total) by mouth daily. For diabetes Patient not taking: Reported on 09/14/2015 07/04/15   Jaclyn Shaggy, MD    HOSPITAL MEDICATIONS: I have reviewed the patient's current medications.  VITALS: Blood pressure 96/62, pulse 87, temperature 97.2 F (36.2 C), temperature source Oral, resp. rate 18, height  (1.753 m), weight 149 lb 4 oz (67.7 kg), SpO2 95 %.  PHYSICAL EXAM: General appearance: cooperative and no distress Neck: no carotid bruit and no JVD Lungs: decreased breath sounds, scattered rhonchi Heart: regular rate and rhythm and soft systolic murmur LSB, AOV area Abdomen: soft, non-tender; bowel sounds normal; no masses,  no organomegaly Extremities: extremities normal, atraumatic, no cyanosis or edema Pulses: 2+ and symmetric Skin: Skin color, texture, turgor normal. No rashes or lesions Neurologic: Grossly normal  LABS: Results for orders placed or performed during the hospital encounter of 10/17/15 (from the past 24 hour(s))  Urinalysis, Routine w reflex microscopic (not at [facility])     Status: Abnormal   Collection Time: 10/17/15  8:22 PM  Result Value Ref Range   Color, Urine AMBER (A) YELLOW   APPearance CLOUDY (A) CLEAR   Specific Gravity, Urine 1.024 1.005 - 1.030   pH 5.5 5.0 - 8.0   Glucose, UA NEGATIVE NEGATIVE mg/dL   Hgb urine dipstick NEGATIVE NEGATIVE   Bilirubin Urine MODERATE (A) NEGATIVE   Ketones, ur NEGATIVE NEGATIVE mg/dL   Protein, ur 295 (A) NEGATIVE mg/dL   Nitrite POSITIVE (A) NEGATIVE   Leukocytes, UA SMALL (A) NEGATIVE  Urine microscopic-add on     Status: Abnormal   Collection Time: 10/17/15  8:22 PM  Result Value Ref Range   Squamous Epithelial / LPF 0-5 (A) NONE SEEN   WBC, UA 6-30 0 - 5 WBC/hpf   RBC / HPF NONE SEEN 0 - 5 RBC/hpf   Bacteria, UA FEW (A) NONE SEEN   Casts GRANULAR CAST (A) NEGATIVE   Urine-Other MUCOUS PRESENT   I-Stat CG4 Lactic Acid, ED     Status: Abnormal    Collection Time: 10/17/15  8:28 PM  Result Value Ref Range   Lactic Acid, Venous 2.55 (HH) 0.5 - 2.0 mmol/L   Comment NOTIFIED PHYSICIAN   MRSA PCR Screening     Status: None   Collection Time: 10/17/15  8:59 PM  Result Value Ref Range   MRSA by PCR NEGATIVE NEGATIVE  Lactic acid, plasma     Status: Abnormal   Collection Time: 10/18/15 12:35 AM  Result Value Ref Range   Lactic Acid, Venous 2.4 (HH) 0.5 - 2.0 mmol/L  Troponin I     Status: Abnormal   Collection Time: 10/18/15 12:35 AM  Result Value Ref Range   Troponin I 0.06 (H) <0.031 ng/mL  Comprehensive metabolic panel  Status: Abnormal   Collection Time: 10/18/15  3:15 AM  Result Value Ref Range   Sodium 136 135 - 145 mmol/L   Potassium 4.4 3.5 - 5.1 mmol/L   Chloride 101 101 - 111 mmol/L   CO2 27 22 - 32 mmol/L   Glucose, Bld 223 (H) 65 - 99 mg/dL   BUN 9 6 - 20 mg/dL   Creatinine, Ser 1.61 (H) 0.61 - 1.24 mg/dL   Calcium 8.3 (L) 8.9 - 10.3 mg/dL   Total Protein 6.7 6.5 - 8.1 g/dL   Albumin 2.7 (L) 3.5 - 5.0 g/dL   AST 14 (L) 15 - 41 U/L   ALT 9 (L) 17 - 63 U/L   Alkaline Phosphatase 110 38 - 126 U/L   Total Bilirubin 2.4 (H) 0.3 - 1.2 mg/dL   GFR calc non Af Amer 46 (L) >60 mL/min   GFR calc Af Amer 54 (L) >60 mL/min   Anion gap 8 5 - 15  CBC with Differential/Platelet     Status: Abnormal   Collection Time: 10/18/15  3:15 AM  Result Value Ref Range   WBC 10.3 4.0 - 10.5 K/uL   RBC 2.41 (L) 4.22 - 5.81 MIL/uL   Hemoglobin 7.7 (L) 13.0 - 17.0 g/dL   HCT 09.6 (L) 04.5 - 40.9 %   MCV 104.1 (H) 78.0 - 100.0 fL   MCH 32.0 26.0 - 34.0 pg   MCHC 30.7 30.0 - 36.0 g/dL   RDW 81.1 (H) 91.4 - 78.2 %   Platelets 202 150 - 400 K/uL   Neutrophils Relative % 29 %   Lymphocytes Relative 8 %   Monocytes Relative 52 %   Eosinophils Relative 0 %   Basophils Relative 0 %   Band Neutrophils 0 %   Metamyelocytes Relative 0 %   Myelocytes 0 %   Promyelocytes Absolute 0 %   Blasts 11 %   nRBC 3 (H) 0 /100 WBC   Other 0 %     Neutro Abs 3.0 1.7 - 7.7 K/uL   Lymphs Abs 0.8 0.7 - 4.0 K/uL   Monocytes Absolute 5.4 (H) 0.1 - 1.0 K/uL   Eosinophils Absolute 0.0 0.0 - 0.7 K/uL   Basophils Absolute 0.0 0.0 - 0.1 K/uL   RBC Morphology STOMATOCYTES    WBC Morphology BLASTS    Smear Review LARGE PLATELETS PRESENT   Lactic acid, plasma     Status: Abnormal   Collection Time: 10/18/15  3:15 AM  Result Value Ref Range   Lactic Acid, Venous 2.5 (HH) 0.5 - 2.0 mmol/L  Troponin I     Status: Abnormal   Collection Time: 10/18/15  3:15 AM  Result Value Ref Range   Troponin I 0.06 (H) <0.031 ng/mL  Iron and TIBC     Status: Abnormal   Collection Time: 10/18/15  3:15 AM  Result Value Ref Range   Iron 69 45 - 182 ug/dL   TIBC 956 (L) 213 - 086 ug/dL   Saturation Ratios 32 17.9 - 39.5 %   UIBC 144 ug/dL  Ferritin     Status: None   Collection Time: 10/18/15  3:15 AM  Result Value Ref Range   Ferritin 225 24 - 336 ng/mL  Vitamin B12     Status: Abnormal   Collection Time: 10/18/15  3:15 AM  Result Value Ref Range   Vitamin B-12 2520 (H) 180 - 914 pg/mL  Folate     Status: None   Collection Time: 10/18/15  3:15 AM  Result Value Ref Range   Folate 14.3 >5.9 ng/mL  Glucose, capillary     Status: Abnormal   Collection Time: 10/18/15  8:17 AM  Result Value Ref Range   Glucose-Capillary 260 (H) 65 - 99 mg/dL   Comment 1 Capillary Specimen   Glucose, capillary     Status: Abnormal   Collection Time: 10/18/15 12:12 PM  Result Value Ref Range   Glucose-Capillary 248 (H) 65 - 99 mg/dL    EKG: NSR, Lateral TWI, similar to prior EKGs  Echo: 07/13/15 Study Conclusions  - Prior tests and procedures: EF was 30-35% on 02-27-2015. - Left ventricle: The cavity size was moderately dilated. There was mild concentric hypertrophy. Systolic function was severely reduced. The estimated ejection fraction was in the range of 10 to 15%. Diffuse hypokinesis. Doppler parameters are consistent with restrictive physiology,  indicative of decreased left ventricular diastolic compliance and/or increased left atrial pressure. Doppler parameters are consistent with elevated ventricular end-diastolic filling pressure. - Ventricular septum: Septal motion showed paradox. - Aortic valve: Valve mobility was restricted. There was mild stenosis. There was trivial regurgitation. Valve area (VTI): 1.22 cm^2. Valve area (Vmax): 1.13 cm^2. Valve area (Vmean): 1.11 cm^2. - Mitral valve: Calcified annulus. Mildly thickened leaflets . There was mild regurgitation. - Left atrium: The atrium was mildly dilated. - Right ventricle: The cavity size was mildly dilated. Wall thickness was normal. Systolic function was moderately reduced. - Tricuspid valve: There was moderate regurgitation. - Pulmonary arteries: Systolic pressure was severely increased. PA peak pressure: 66 mm Hg (S). - Pericardium, extracardiac: A trivial pericardial effusion was identified posterior to the heart. Features were not consistent with tamponade physiology.  Impressions:  - Compared to the prior study from 02/27/2015 th eleft ventricle is now moderately dilated with severely decreased systolic function and estimated LVEF 10-15%, previously 30-35%. Right ventricular function is moderately impaired.   IMAGING: Dg Chest 2 View  10/17/2015  CLINICAL DATA:  Shortness of breath EXAM: CHEST  2 VIEW COMPARISON:  09/20/2015 FINDINGS: Low lung volumes with vascular crowding. Superimposed mild interstitial edema is suspected. Moderate bilateral pleural effusions. Associated bilateral lower lobe opacities, likely atelectasis. No pneumothorax. Cardiomegaly. IMPRESSION: Low lung volumes. Cardiomegaly with suspected mild interstitial edema. Moderate bilateral pleural effusions. Associated bilateral lower lobe opacities, likely atelectasis. Electronically Signed   By: Charline BillsSriyesh  Krishnan M.D.   On: 10/17/2015 17:08     IMPRESSION: Principal Problem:   Acute systolic CHF (congestive heart failure) (HCC) Active Problems:   Chronic systolic CHF (congestive heart failure) (HCC)   Type II diabetes mellitus with renal manifestations, uncontrolled (HCC)   Chronic kidney disease, stage 3   Personal history of noncompliance with medical treatment   Dementia   RECOMMENDATION: IV diuresis, poor candidate for home IV inotropics. MD to see.   Time Spent Directly with Patient: 40 minutes  Corine ShelterLuke Kilroy, [place]  [phone] beeper 10/18/2015, 4:48 PM   Attending Note:   The patient was seen and examined.  Agree with assessment and plan as noted above.  Changes made to the above note as needed.  Pt has recurrent CHF - he admits that he doesn't take his meds. He has severe CHF but its not likely to get any better since he does not take his meds.  He is not a candidate for home inotropic therapy.   Continue IV lasix .  Continue meds No new recs   Vesta MixerPhilip J. Dameshia Seybold, Montez HagemanJr., MD, [facility] 10/18/2015, 5:50 PM [address],  [place] Pager [phone]

## 2015-10-18 NOTE — Progress Notes (Signed)
CRITICAL VALUE ALERT  Critical value received:  Lactic Acid   Date of notification:  2.5  Time of notification:  10/18/15  Critical value read back: yes  Nurse who received alert:  A. Declin Rajan RN  Consistent with previously called value.

## 2015-10-18 NOTE — Care Management Note (Addendum)
Case Management Note  Patient Details  Name: Russell Hamilton MRN: [id number] Date of Birth: 09/23/1941  Subjective/Objective:           Adm w heart failure         Action/Plan: lives alone, chart states act w hospice, niece works but Optometristcks on pt   Expected Discharge Date:                  Expected Discharge Plan:     In-House Referral:     Discharge planning Services     Post Acute Care Choice:    Choice offered to:     DME Arranged:    DME Agency:     HH Arranged:    HH Agency:     Status of Service:     Medicare Important Message Given:    Date Medicare IM Given:    Medicare IM give by:    Date Additional Medicare IM Given:    Additional Medicare Important Message give by:     If discussed at Long Length of Stay Meetings, dates discussed:    Additional Comments: ur review done, verified pt [place] [facility] [name]  [name] T, RN 10/18/2015, 8:33 AM

## 2015-10-18 NOTE — Progress Notes (Signed)
[facility] RN Visit  This is a related admission to [facility] diagnosis of heart failure. Patient is a Full Code. Patient seen in room, sleeping in chair.  Attempted to awaken patient with loud voice and touch, however patient to lethargic to participate in conversation at this time.  Patient on 3L [place] with O2 sats at 100% and respirations WNL. Patient currently receiving IVF via a PIV.  Patient received 1 Percocet 5/325 mg po for pain. Updated [facility] social worker, Shawna OrleansMelanie to assist with discharge planning.    [facility] will continue to follow and anticipate any discharge needs.  Please call with any questions or concerns.  Thank you, Hessie KnowsStacie Wilkinson RN, [facility] Hospital Liaison [phone]

## 2015-10-18 NOTE — Progress Notes (Signed)
CRITICAL VALUE ALERT  Critical value received:  Lactic 2.4  Date of notification:  10/18/15  Time of notification:  0125  Critical value read back: yes  Nurse who received alert:  A. Lamar BenesHaggard RN   MD notified (1st page):  Wenda LowJames Joyner with Family Medicine  Time of first page:   0140  MD notified (2nd page):  Time of second page:  Responding MD:  Wenda LowJames Joyner  Time MD responded:  [phone]  Also made aware of Troponin result 0.06. No new orders at this time. Thresa RossA. Haggard RN

## 2015-10-18 NOTE — Evaluation (Signed)
Physical Therapy Evaluation Patient Details Name: Russell Hamilton MRN: [id number] DOB: Oct 04, 1941 Today's Date: 10/18/2015   History of Present Illness  Russell Hamilton is a 74 y.o. male presenting with SOB. 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  CHF exacerbation.   Clinical Impression  Pt admitted with above diagnosis. Pt currently with functional limitations due to the deficits listed below (see PT Problem List). Pt was able to get into chair with encouragement.  Pt did not want to do more than get into chair.  Brother and pt state pt is sedentary at home.  Has hospice at home.  Feel that SNF would  be good for rehab.  Will follow acutely.   Pt will benefit from skilled PT to increase their independence and safety with mobility to allow discharge to the venue listed below.      Follow Up Recommendations SNF;Supervision/Assistance - 24 hour    Equipment Recommendations  None recommended by PT    Recommendations for Other Services       Precautions / Restrictions Precautions Precautions: Fall Restrictions Weight Bearing Restrictions: No      Mobility  Bed Mobility Overal bed mobility: Needs Assistance Bed Mobility: Rolling;Supine to Sit Rolling: Min guard     Sit to supine: Mod assist;+2 for physical assistance   General bed mobility comments: Pt not giving effort initiially.    Transfers Overall transfer level: Needs assistance Equipment used: 2 person hand held assist Transfers: Sit to/from [employer] Sit to Stand: Mod assist;+2 physical assistance Stand pivot transfers: Mod assist;+2 physical assistance       General transfer comment: Pt needed assist to power up and steady once up.  Took pivotal steps to chair with bil UE support.    Ambulation/Gait                Stairs            Wheelchair Mobility    Modified Rankin (Stroke Patients Only)       Balance Overall  balance assessment: Needs assistance Sitting-balance support: No upper extremity supported;Feet supported Sitting balance-Leahy Scale: Fair     Standing balance support: Bilateral upper extremity supported;During functional activity Standing balance-Leahy Scale: Poor Standing balance comment: Pt needed bil UE support for balance in standing.  Slightly unsteady.                              Pertinent Vitals/Pain Pain Assessment: Faces Faces Pain Scale: Hurts even more Pain Location: left UE Pain Descriptors / Indicators: Grimacing;Aching Pain Intervention(s): Limited activity within patient's tolerance;Monitored during session;Premedicated before session;Repositioned;Patient requesting pain meds-RN notified  85% on RA, 99% on 4LO2; 110/69 pre BP and 90/52 post BP.      Home Living Family/patient expects to be discharged to:: Private residence Living Arrangements: Alone Available Help at Discharge: Family;Available PRN/intermittently (Has Hospice care per brother, niece & sister in law check in) Type of Home: House Home Access: Stairs to enter Entrance Stairs-Rails: Left Entrance Stairs-Number of Steps: 8 Home Layout: Two level;Able to live on main level with bedroom/bathroom Home Equipment: Gilmer Mor - single point;Wheelchair - [employer] - 2 wheels;Hospital bed;Bedside commode (home O2)      Prior Function Level of Independence: Independent with assistive device(s)         Comments: Pt reports he uses SPC at home, he endorses falls.  Reports he does not drive  and that niece assists him with groceries.  He states has to use scooter in grocery store      Hand Dominance   Dominant Hand: Right    Extremity/Trunk Assessment   Upper Extremity Assessment: Defer to OT evaluation           Lower Extremity Assessment: Generalized weakness      Cervical / Trunk Assessment: Kyphotic  Communication   Communication: Expressive difficulties (dysarthric )   Cognition Arousal/Alertness: Awake/alert Behavior During Therapy: WFL for tasks assessed/performed Overall Cognitive Status: No family/caregiver present to determine baseline cognitive functioning                      General Comments      Exercises General Exercises - Lower Extremity Long Arc Quad: AROM;Both;10 reps;Seated Low Level/ICU Exercises Ankle Circles/Pumps: AROM;5 reps;Supine;Right;Left      Assessment/Plan    PT Assessment Patient needs continued PT services  PT Diagnosis Generalized weakness;Acute pain   PT Problem List Decreased activity tolerance;Decreased balance;Decreased mobility;Decreased knowledge of use of DME;Decreased safety awareness;Decreased knowledge of precautions;Pain  PT Treatment Interventions DME instruction;Gait training;Functional mobility training;Therapeutic activities;Therapeutic exercise;Balance training;Neuromuscular re-education;Cognitive remediation;Patient/family education;Stair training   PT Goals (Current goals can be found in the Care Plan section) Acute Rehab PT Goals Patient Stated Goal: did not state PT Goal Formulation: With patient Time For Goal Achievement: 11/01/15 Potential to Achieve Goals: Good    Frequency Min 2X/week   Barriers to discharge Decreased caregiver support      Co-evaluation               End of Session Equipment Utilized During Treatment: Gait belt;Oxygen Activity Tolerance: Patient limited by fatigue Patient left: in chair;with call bell/phone within reach;with chair alarm set;with family/visitor present Nurse Communication: Mobility status         Time: [id number] PT Time Calculation (min) (ACUTE ONLY): 23 min   Charges:   PT Evaluation $Initial PT Evaluation Tier I: 1 Procedure PT Treatments $Therapeutic Activity: 8-22 mins   PT G CodesBerline Hamilton:        Russell Hamilton 10/18/2015, 12:08 PM  Russell Hamilton,PT Acute Rehabilitation [phone] (pager)

## 2015-10-19 ENCOUNTER — Inpatient Hospital Stay (HOSPITAL_COMMUNITY)

## 2015-10-19 DIAGNOSIS — E1165 Type 2 diabetes mellitus with hyperglycemia: Secondary | ICD-10-CM

## 2015-10-19 DIAGNOSIS — M25522 Pain in left elbow: Secondary | ICD-10-CM | POA: Insufficient documentation

## 2015-10-19 DIAGNOSIS — N183 Chronic kidney disease, stage 3 (moderate): Secondary | ICD-10-CM

## 2015-10-19 DIAGNOSIS — R0602 Shortness of breath: Secondary | ICD-10-CM | POA: Insufficient documentation

## 2015-10-19 DIAGNOSIS — N189 Chronic kidney disease, unspecified: Secondary | ICD-10-CM

## 2015-10-19 DIAGNOSIS — I5022 Chronic systolic (congestive) heart failure: Secondary | ICD-10-CM

## 2015-10-19 DIAGNOSIS — E1122 Type 2 diabetes mellitus with diabetic chronic kidney disease: Secondary | ICD-10-CM

## 2015-10-19 LAB — GLUCOSE, CAPILLARY
GLUCOSE-CAPILLARY: 129 mg/dL — AB (ref 65–99)
GLUCOSE-CAPILLARY: 163 mg/dL — AB (ref 65–99)
Glucose-Capillary: 226 mg/dL — ABNORMAL HIGH (ref 65–99)
Glucose-Capillary: 229 mg/dL — ABNORMAL HIGH (ref 65–99)
Glucose-Capillary: 256 mg/dL — ABNORMAL HIGH (ref 65–99)

## 2015-10-19 LAB — URINE CULTURE: CULTURE: NO GROWTH

## 2015-10-19 LAB — BASIC METABOLIC PANEL
Anion gap: 9 (ref 5–15)
BUN: 15 mg/dL (ref 6–20)
CALCIUM: 8.1 mg/dL — AB (ref 8.9–10.3)
CO2: 29 mmol/L (ref 22–32)
CREATININE: 1.46 mg/dL — AB (ref 0.61–1.24)
Chloride: 95 mmol/L — ABNORMAL LOW (ref 101–111)
GFR calc Af Amer: 54 mL/min — ABNORMAL LOW (ref >60)
GFR, EST NON AFRICAN AMERICAN: 46 mL/min — AB (ref >60)
GLUCOSE: 195 mg/dL — AB (ref 65–99)
Potassium: 3.9 mmol/L (ref 3.5–5.1)
Sodium: 133 mmol/L — ABNORMAL LOW (ref 135–145)

## 2015-10-19 MED ORDER — LISINOPRIL 2.5 MG PO TABS
2.5000 mg | ORAL_TABLET | Freq: Every day | ORAL | Status: DC
  Filled 2015-10-19 (×2): qty 1

## 2015-10-19 MED ORDER — FUROSEMIDE 40 MG PO TABS
60.0000 mg | ORAL_TABLET | Freq: Two times a day (BID) | ORAL | Status: DC
  Administered 2015-10-19 – 2015-10-20 (×3): 60 mg via ORAL
  Filled 2015-10-19 (×4): qty 1

## 2015-10-19 MED ORDER — INSULIN ASPART 100 UNIT/ML ~~LOC~~ SOLN
0.0000 [IU] | Freq: Three times a day (TID) | SUBCUTANEOUS | Status: DC
  Administered 2015-10-20: 5 [IU] via SUBCUTANEOUS

## 2015-10-19 MED ORDER — CARVEDILOL 3.125 MG PO TABS
3.1250 mg | ORAL_TABLET | Freq: Two times a day (BID) | ORAL | Status: DC
  Administered 2015-10-19 – 2015-10-20 (×4): 3.125 mg via ORAL
  Filled 2015-10-19 (×5): qty 1

## 2015-10-19 NOTE — Progress Notes (Signed)
Family Medicine Teaching Service Daily Progress Note Intern Pager: [phone]  Patient name: Russell Hamilton Biel Medical record number: [id number] Date of birth: 01/11/1941 Age: 74 y.o. Gender: male  Primary Care Provider: Ambrose FinlandValerie A Keck, NP Consultants: Cardiology, palliative care Code Status: Full  Pt Overview and Major Events to Date:  12/12 - Admitted for SOB  Assessment and Plan: Russell Hamilton Fleig is a 74 y.o. male presenting with SOB. 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   Dyspnea: Most likely multifactorial due to heart failure exacerbation, COPD exacerbation, coronary hypertension and anemia. EKG: Sinus tachycardia with inverted T waves the V4 - V6 , unchanged from previous and consistent with LV strain. Chest x-ray consistent with interstitial edema and moderate bilateral pleural effusions. Less likely pneumonia (WBC WNL, afebrile).Low suspicion for PE (Well's Score 1.5). Troponins negative x1, 0.06 x2. Lactic acid: 4.84 > 2.55>2.4>2.5 (persistent elevation likely due to end-stage CHF).  - Treating HF and COPD exacerbations as below  Cardiac: Acute heart failure exacerbation. HFrEF Echo 07/2015: EF 10-15%, diffuse hypokinesis, mild aortic stenosis. Long history of heart failure noncompliance; not on Beta Blocker or ACE-I (stopped on last discharge 09/27/15). BNP 1715 on admission. Daily wt: 161lbs (admit)>158 lbs today (dry wt unknown). Cardiology consulted, and felt that patient is not a candidate for IV inotropes but should continues IV diuresis. Per cards, since pt does not take his medications, he will continue to have exacerbations and decline.  - Continue to monitor blood pressure and electrolytes with diuresis - Strict I&O's, daily weights - Transitioned to home dose of PO Lasix 60 mg BID   - Home lisinopril 2.5 mg qd resumed per cards recs  Resp: COPD exacerbation, pulmonary hypertension, current tobacco abuse. Pt received  vanc/zosyn in ED for presumed HCAP but was transitioned to levaquin for more specific coverage. No growth on blood or urine cultures to date. O2 sats in 90s on [facility] but patient reports difficulty breathing.  - Continue [place] O2; wean as tolerated. Home O2 requirement 2L. - F/u urine, blood cx - Continue home: Symbicort - Cont levaquin 500 mg qd for more specific coverage of COPD exacerbation as well as PNA - on day 3/7 (12/13>>) - Prednisone 40mg  qd x 5 days (12/12>>); Last dose on 12/17 - Duonebs q6hrs prn   Neuro: Lewy body dementia; hx of CVAs and seizure. Alert and oriented on admission. Previously evaluated by psych during last admission and found to have capacity. No new neurological symptoms.Not on anticonvulsants. EEG from last admit was normal. - Start ASA 81mg  qd - Consider statin  Left elbow pain: Reports new left elbow pain.Reports history of falls at home. Last admission, complained of right elbow pain. Pt reports continued but improved pain but continues to decline elbow xray. - Continue home Roxicet  q4hrs prn. Home dose BID - Consider gout (uric acid 8.8 11/201/6): cont colchicine (CrCl 57)  Bilateral Leg pain: Possibly due to neuropathy secondary to diabetes or malnutrition versus claudication (Current smoker; weak pulses in feet). B12 elevated. ABI 0.77 on R, 0.66 on L. - Continue home gabapentin 600mg  BID; titrate up as needed - Discontinue Lyrica  CKD Stage 3b: (Cr baseline 1.4). Pt reports noncompliance with ACEI. On admission was 1.2 and now 1.45 this morning.  - Monitor creatinine with diuresis - Consider discontinuing PPI if not symptomatic for GERD  DM2: A1c 8.2 (07/13/15). Hyperglycemia in setting of steriods. CBGs 249, 256 overnight. - Holding Amaryl and Januvia;  patient reports non-compliance - Increased to moderate SSI - Monitor in setting of steroids  Anemia (Macrocytic): Hbg 8.7 on admission (baseline 13, 06/2015), MCV 104, RDW elevated. Patient denying any  blood loss. Iron panel, ferritin, folate normal; B12 elevated. Smear showed findings concerning for a primary hematologic disorder. Hgb decreased today to 7.1.  - FOBT pending - Transfusion threshold 7  Hypocalcemia: Ca 8.0 this morning. Asymptomatic. Corrected Ca 9.0. - Continue to monitor  Social: Lives alone; niece checks on him and has [facility] hospice nurse come out 2x a week. PT/OT recommended SNF, but patient refuses.  - Consult Case Management   FEN/GI: Heart Healthy; SLIV Prophylaxis: Subq heparin  Disposition: Home pending improvement. Patient refusing SNF or other home health assistance.   Subjective:  Patient reports more difficulty breathing this AM. Was on 2L [place] when I entered the room but asked if I would increase his O2 to 3L. Said that he did not know yet if he was breathing better on 3L. Endorses improved but persistent elbow pain but says he does not want xray.   Objective: Temp:  [97.4 F (36.3 C)-99.5 F (37.5 C)] 97.5 F (36.4 C) (12/15 0706) Pulse Rate:  [84-95] 92 (12/15 0706) Resp:  [14-18] 18 (12/15 0706) BP: (84-116)/(57-75) 110/62 mmHg (12/15 0706) SpO2:  [90 %-100 %] 92 % (12/15 0706) Weight:  [156 lb 8.4 oz (71 kg)-158 lb 4.6 oz (71.8 kg)] 158 lb 4.6 oz (71.8 kg) (12/15 8295) Physical Exam: General: lying in bed in NAD Cardiovascular: RRR, no murmurs appreciated  Respiratory: Diffusely decreased breath sounds. Slightly increased work of breathing on [facility] Abdomen: soft, non-tender, non-distended, +BS MSK: moving all extremities spontaneously Neuro: A&Ox4; no focal deficits Psych: Normal mood and affect  Laboratory:  Recent Labs Lab 10/17/15 1525 10/18/15 0315 10/20/15 0625  WBC 10.2 10.3 9.6  HGB 8.7* 7.7* 7.1*  HCT 28.2* 25.1* 23.1*  PLT 218 202 179    Recent Labs Lab 10/18/15 0315 10/19/15 0228 10/20/15 0625  NA 136 133* 130*  K 4.4 3.9 3.7  CL 101 95* 94*  CO2 BUN 9 15 21*  CREATININE 1.46* 1.46* 1.45*  CALCIUM 8.3*  8.1* 8.0*  PROT 6.7  --   --   BILITOT 2.4*  --   --   ALKPHOS 110  --   --   ALT 9*  --   --   AST 14*  --   --   GLUCOSE 223* 195* 270*   Trop neg>0.06 x2 BNP 1715.9 Lactic Acid - 4.84>2.55>2.4>2.5  Imaging/Diagnostic Tests: Dg Chest 2 View  10/17/2015  CLINICAL DATA:  Shortness of breath EXAM: CHEST  2 VIEW COMPARISON:  09/20/2015 FINDINGS: Low lung volumes with vascular crowding. Superimposed mild interstitial edema is suspected. Moderate bilateral pleural effusions. Associated bilateral lower lobe opacities, likely atelectasis. No pneumothorax. Cardiomegaly. IMPRESSION: Low lung volumes. Cardiomegaly with suspected mild interstitial edema. Moderate bilateral pleural effusions. Associated bilateral lower lobe opacities, likely atelectasis. Electronically Signed   By: Charline Bills M.D.   On: 10/17/2015 17:08   Marquette Saa, MD 10/20/2015, 9:21 AM PGY-1, [facility] Family Medicine FPTS Intern pager: [phone], text pages welcome

## 2015-10-19 NOTE — Progress Notes (Signed)
[facility] Social Work [facility]. Patient is currently receiving care at home. He lives alone, yet has support from a niece and her family. He receives daily calls or visits from family. Patient has been reluctant to accept interventions offered by agency and family- hospice aide, volunteer, lock box/doorbell to allow access to the home as he lives in locked entry house that has several apartments, and referral was made to [employer] but he refused. He has not been taking medications according to full med box in the home. He is agreeable to wearing 02 and appears to have decreased his smoking.  Team and family have concerns with his care at home, yet patient is unwilling to consider alternatives. He has rescinded his DNR and is full code. LCSW has concerns about care needs, yet patient voices no concerns and ongoing desire to return to home at time of discharge. Hospice will continue to offer care as patient allows and support his decision-making as appropriate.  Orson GearMelanie Fuqua, [place]CS[place]  [phone]

## 2015-10-19 NOTE — Hospital Discharge Follow-Up (Signed)
Attempted to meet with the patient bu the was very sleepy, did not want to talk and noted that " too many have been in and out all day."  Will continue to follow hospital progress.

## 2015-10-19 NOTE — Progress Notes (Signed)
VASCULAR LAB PRELIMINARY  ARTERIAL  ABI completed:  Unable to obtain great toe pressures due to involuntary foot movements.    RIGHT    LEFT    PRESSURE WAVEFORM  PRESSURE WAVEFORM  BRACHIAL 100 Triphasic  BRACHIAL 94 Triphasic   DP   DP    AT 77 Dampened monophasic  AT 56 Dampened monophasic   PT 62 Dampened monophasic  PT 66 Dampened monophasic   PER   PER    GREAT TOE  NA GREAT TOE  NA    RIGHT LEFT  ABI 0.77 0.66     Isaic Syler, RVT 10/19/2015, 11:30 AM

## 2015-10-19 NOTE — Progress Notes (Signed)
Family Medicine Teaching Service Daily Progress Note Intern Pager: [phone]  Patient name: Russell Hamilton Medical record number: [id number] Date of birth: 31-May-1941 Age: 74 y.o. Gender: male  Primary Care Provider: Ambrose Finland, NP Consultants: Cardiology, palliative care Code Status: Full  Pt Overview and Major Events to Date:  12/12 - Admitted for SOB  Assessment and Plan: Russell Hamilton is a 74 y.o. male presenting with SOB. 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   Dyspnea: Most likely multifactorial due to heart failure exacerbation, COPD exacerbation, coronary hypertension and anemia. EKG: Sinus tachycardia with inverted T waves the V4 - V6 , unchanged from previous and consistent with LV strain. Chest x-ray consistent with interstitial edema and moderate bilateral pleural effusions. Less likely pneumonia (WBC WNL, afebrile).Low suspicion for PE (Well's Score 1.5). Troponins negative x1, 0.06 x2.  Lactic acid: 4.84 > 2.55>2.4>2.5 (persistent elevation likely due to end-stage CHF).  - Treating HF and COPD exacerbations as below  Cardiac: Acute heart failure exacerbation. HFrEF Echo 07/2015: EF 10-15%, diffuse hypokinesis, mild aortic stenosis. Long history of heart failure noncompliance; not on Beta Blocker or ACE-I (stopped on last discharge 09/27/15). BNP 1715 on admission. Daily wt: 161lbs (admit)>152 lbs today (ry wt unknown). Cardiology consulted yesterday, and felt that patient is not a candidate for IV inotropes but should continues IV diuresis. Per cards, since pt does not take his medications, he will continue to have exacerbations and continue to decline.  - Due to soft pressures and worsening Cr will reduce to Lasix IV  daily; reassess daily - Continue to monitor blood pressure and electrolytes with diuresis - Strict I&O's, daily weights  Resp: COPD exacerbation, pulmonary hypertension, current tobacco abuse. Pt  received vanc/zosyn in ED for presumed HCAP but was transitioned to levaquin for more specific coverage. On 4L O2 but decreased to 2L when I was in the room with no respiratory complaints.  - Continue [place] O2; wean as tolerated. Home O2 requirement 2L. - Urine cx pending; blood cx no growth <24 hrs - Continue home: Symbicort - Cont levaquin 500 mg qd for more specific coverage of COPD exacerbation as well as PNA - on day 2/7 (12/13>>) - Prednisone  qd x 5 days (12/12>>); Last dose on 12/17 - Duonebs q6hrs prn   Neuro: Lewy body dementia; Hx of CVAs and seizure. Alert and oriented on admission. Previously evaluated by psych during last admission and found to have capacity. No new neurological symptoms.Not on anticonvulsants. EEG from last admit was normal. - Start ASA  qd - Consider statin  Left elbow pain: Reports new left elbow pain for last 2 days. Reports history of falls at home. No obvious erythema; mild swelling.Tender to palpation. Last admission, complained of right elbow pain. Pt declined elbow xray. - Continue home Roxicet: Inc freq to q4hrs prn. Home dose BID - Consider gout (uric acid 8.8 11/201/6): cont colchicine (CrCl 57)  Bilateral Leg pain: Possibly due to neuropathy secondary to diabetes or malnutrition versus claudication (Current smoker; weak pulses in feet). B12 elevated. - Continue home gabapentin  BID; titrate up as needed - Discontinue Lyrica - ABI: ordered  CKD Stage 3b: (Cr baseline 1.4). Pt reports noncompliance with ACEI. On admission was 1.2 and now 1.46 this morning (12/14).  - Monitor creatinine with diuresis - Consider discontinuing PPI if not symptomatic for GERD  DM2: A1c 8.2 (07/13/15). Hyperglycemia in setting of steriods. CBGs 256, 129, 226 overnight. -  Holding Amaryl and Januvia; patient reports non-compliance - Increased to moderate SSI - Monitor in setting of steroids  Anemia (Macrocytic): Hbg 7.7 12/13 (baseline 13, 06/2015), MCV  104, RDW elevated. Patient denying any blood loss. Iron panel, ferritin, folate normal; B12 elevated. - FOBT pending - Transfusion threshold 7  Hypocalcemia: Ca 8.1 this morning. Asymptomatic. Corrected Ca 9.1. - Continue to monitor  Social: Lives alone; niece checks on [facility]. - Consult Case Management  - PT/OT consulted - recommend SNF  FEN/GI: Heart Healthy; SLIV Prophylaxis: Subq heparin  Disposition: Home pending improvement  Subjective:  Patient with no complaints this AM other than being cold. Denies difficulty breathing other than usual SOB. Endorses cough productive of clear sputum but says this is chronic.   Objective: Temp:  [97.2 F (36.2 C)-98 F (36.7 C)] 97.9 F (36.6 C) (12/14 0800) Pulse Rate:  [77-98] 93 (12/14 0800) Resp:  [9-25] 25 (12/14 0800) BP: (88-119)/(52-78) 111/65 mmHg (12/14 0800) SpO2:  [86 %-100 %] 100 % (12/14 0800) Weight:  [152 lb 12.5 oz (69.3 kg)] 152 lb 12.5 oz (69.3 kg) (12/14 0401) Physical Exam: General: lying in bed in NAD Cardiovascular: RRR, no murmurs appreciated  Respiratory: Diffusely decreased breath sounds. Normal respiratory effort on 2LNC. Abdomen: soft, non-tender, non-distended, +BS MSK: moving all extremities spontaneously Neuro: A&Ox4; no focal deficits Psych: Normal mood and affect  Laboratory:  Recent Labs Lab 10/17/15 1525 10/18/15 0315  WBC 10.2 10.3  HGB 8.7* 7.7*  HCT 28.2* 25.1*  PLT 218 202    Recent Labs Lab 10/17/15 1525 10/18/15 0315 10/19/15 0228  NA 138 136 133*  K 4.4 4.4 3.9  CL 103 101 95*  CO2 23 27 29   BUN 9 9 15   CREATININE 1.20 1.46* 1.46*  CALCIUM 8.7* 8.3* 8.1*  PROT  --  6.7  --   BILITOT  --  2.4*  --   ALKPHOS  --  110  --   ALT  --  9*  --   AST  --  14*  --   GLUCOSE 144* 223* 195*   Trop neg>0.06 x2 BNP 1715.9 Lactic Acid - 4.84>2.55>2.4>2.5  Imaging/Diagnostic Tests: Dg Chest 2 View  10/17/2015  CLINICAL DATA:   Shortness of breath EXAM: CHEST  2 VIEW COMPARISON:  09/20/2015 FINDINGS: Low lung volumes with vascular crowding. Superimposed mild interstitial edema is suspected. Moderate bilateral pleural effusions. Associated bilateral lower lobe opacities, likely atelectasis. No pneumothorax. Cardiomegaly. IMPRESSION: Low lung volumes. Cardiomegaly with suspected mild interstitial edema. Moderate bilateral pleural effusions. Associated bilateral lower lobe opacities, likely atelectasis. Electronically Signed   By: Charline BillsSriyesh  Krishnan M.D.   On: 10/17/2015 17:08   Marquette SaaAbigail Joseph Hillery Bhalla, MD 10/19/2015, 9:25 AM PGY-1, [facility] FPTS Intern pager: [phone], text pages welcome

## 2015-10-19 NOTE — Clinical Documentation Improvement (Signed)
Family Medicine    Please specify  associated manifestations of diabetes:     Nephropathy (e. g. Chronic kidney disease)  Neuropathy (e.g., gastroparesis, neuropathic ulcer, neuropathic impotence)    Peripheral circulatory manifestations (e.g., angiopathy, gangrene, PVD [with ulcer], vascular impotence)  Skin (e.g., cellulitis, ulcer)                                                                          Other diabetic manifestations (such as osteomyelitis)(please specify)   Unable to determine  Other  Clinically Undetermined   Please exercise your independent, professional judgment when responding. A specific answer is not anticipated or expected.   Thank You, Lavonda JumboLawanda J Idara [place] [place] [phone]

## 2015-10-19 NOTE — Clinical Documentation Improvement (Deleted)
Family Medicine  Documentation of "Pneumonia" please state if diagnosis was ruled in or ruled out.  Thank you    Pneumonia ruled out  Pneumonia ruled in  Other  Clinically Undetermined    Please exercise your independent, professional judgment when responding. A specific answer is not anticipated or expected.   Thank You, Lavonda JumboLawanda J Vicci [facility] Information Management [place] [phone]

## 2015-10-19 NOTE — Discharge Summary (Signed)
Family Medicine Teaching [facility] Discharge Summary  Patient name: Russell Hamilton Medical record number: [id number] Date of birth: Apr 29, 1941 Age: 74 y.o. Gender: male Date of Admission: 10/17/2015  Date of Discharge: 10/21/15 Admitting Physician: Leighton Roach McDiarmid, MD  Primary Care Provider: Ambrose Finland, NP Consultants: cardiology, palliative care  Indication for Hospitalization: SOB  Discharge Diagnoses/Problem List:  Patient Active Problem List   Diagnosis Date Noted  . SOB (shortness of breath)   . Left elbow pain   . Dementia   . Acute HF (heart failure) (HCC) 10/17/2015  . Palliative care encounter   . Observed seizure-like activity (HCC)   . 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 Personal history of noncompliance with medical treatment 09/21/2015  . Suicidal ideation   . CHF exacerbation (HCC) 09/20/2015  . Acute on chronic congestive heart failure (HCC)   . Chronic bronchitis (HCC)   . Secondary hypertension, unspecified   . Uncontrolled type 2 diabetes mellitus with ketoacidosis without coma, without long-term current use of insulin (HCC)   . Acute renal insufficiency 09/16/2015  . Renal insufficiency   . Sprain of left knee   . Fall at home   . Hypoxia   . Normochromic normocytic anemia   . Prolonged QT interval   . COPD exacerbation (HCC) 09/14/2015  . Chronic pain syndrome 09/08/2015  . Neutropenia (HCC) 07/13/2015  . CHF (congestive heart failure) (HCC) 07/13/2015  . Acute on chronic combined systolic and diastolic congestive heart failure (HCC) 06/12/2015  . Orthostatic hypotension 05/09/2015  . Tinea pedis 04/26/2015  . Benign paroxysmal positional vertigo 04/12/2015  . GERD (gastroesophageal reflux disease) 04/05/2015  . COPD (chronic obstructive pulmonary disease) (HCC) 03/26/2015  . COPD with acute exacerbation (HCC) 03/26/2015  . Chronic kidney disease, stage 3 03/01/2015  . Type 2 diabetes mellitus with diabetic chronic  kidney disease (HCC)   . Unstable angina (HCC) 02/25/2015  . Congestive heart failure (HCC) 02/24/2015  . Cough, persistent 02/08/2015  . Tobacco abuse 02/08/2015  . Acute diastolic CHF (congestive heart failure) (HCC) 12/12/2014  . Acute systolic CHF (congestive heart failure) (HCC) 12/12/2014  . Acute CHF (HCC) 12/11/2014  . AKI (acute kidney injury) (HCC) 11/22/2014  . Hypokalemia 11/21/2014  . Elevated troponin 11/20/2014  . Acute on chronic combined systolic and diastolic heart failure (HCC) 11/20/2014  . Foot ulcer (HCC) 11/20/2014  . NSTEMI (non-ST elevated myocardial infarction) (HCC)   . Pleural effusion   . Acute respiratory failure with hypoxia (HCC)   . Protein-calorie malnutrition, severe (HCC) 08/13/2014  . Chronic systolic CHF (congestive heart failure) (HCC)   . Type II diabetes mellitus with renal manifestations, uncontrolled (HCC)   . Hypertension   . Stroke (HCC)   . Acute systolic CHF (congestive heart failure), NYHA class 4 (HCC) 08/12/2014  . History of stroke 08/12/2014  . HTN (hypertension) 08/12/2014  . Peripheral neuropathy (HCC) 08/12/2014     Disposition: home  Discharge Condition: stable  Discharge Exam:  General: sitting up in bed in NAD Cardiovascular: RRR, no murmurs appreciated  Respiratory: Diffusely decreased breath sounds however improved from yesterday. Regular work of breathing on 3LNC Abdomen: soft, non-tender, non-distended, +BS MSK: moving all extremities spontaneously Neuro: A&Ox4; no focal deficits  Brief Hospital Course:  Patient presented with SOB x3 days, as well as worsening cough. These symptoms were thought to be of multifactorial etiology, given patient's extensive medical history, so he was admitted for treatment of acute heart failure and COPD exacerbation.  Dyspnea Patient reports non-compliance, so dyspnea was felt to be secondary to COPD exacerbation +/- acute heart failure exacerbation. He received vanc/zosyn in ED  for presumed HCAP, but was transitioned to levaquin when COPD exacerbation became the more likely cause of dyspnea. He was also started on prednisone burst. Patient did have increased oxygen requirement up to 4L, and there were discrepancies regarding his home use of O2 (he reported using 2L O2 at home to some providers, and no home oxygen use to others). Blood and urine cultures had no growth.  Patient's oxygen requirement decreased to 3L with O2 sat in the high 90s, and he was subsequently deemed stable for discharge.   Acute on chronic heart failure Patient's BNP was very elevated to 1715 on admission, with weight >10 pounds above dry weight. Cardiology was consulted, and reported that patient is not a candidate for IV inotropes, and continued aggressive diuresis was the best treatment. He received IV Lasix, then, with improvement of resp status and fluid status, was transitioned to his home PO Lasix. After improvement in fluid and respiratory status, he was deemed stable for discharge.   Anemia Patient presented with Hgb of 8.7, decreased from his baseline of 13. He denied any blood loss or symptoms of anemia, however his Hgb continued to decrease as low as 7.1. He was agreeable to transfusion, and received 1U PRBC. FOBT was negative. Given his elevated B12 and findings on blood smear, it appears that patient most likely have a primary hematologic disorder that is probably contributing to his anemia. Hgb improved to 8.8 after transfusion on day of discharge.   Issues for Follow Up:  1. Patient reported L elbow pain during admission, but denied any recent falls. He refused xray, and was continued on his home pain meds (Roxicet). He was started on colchicine out of concern for gout.  2. Patient not currently on statin despite history of CVA. Consider beginning statin.  3. Patient reported generalized pain and difficulty ambulating. He was offered SNF placement as well as home health and refused all  services. He does have hospice nurse that comes to his house twice weekly, and has family that checks on him daily.  4. Patient to continue 7 day course of levaquin 500 mg qd (last dose 12/19).  5. Patient to continue 5 day course of prednisone 40 mg (last dose 12/17).  6. Recommend repeat CBC at follow-up to monitor for anemia given need for transfusion during admission. 7. Patient found to have macrocytic anemia with elevated B12. Blood smear was performed, which had findings suspicious for primary hematologic disorder. As patient is already hospice care, did not work-up this issue.   8. Patient started on Coreg by cardiology and resumed lisinopril patient had previously been taking. This was continued at discharge.  9. Patient with positive ABIs. Could consider started Pletal outpatient, however patient already non-compliant with gabapentin and Lyrica for leg pain so not sure how much this would help.   Significant Procedures: blood transfusion 12/15 (1 unit PRBC)  Significant Labs and Imaging:   Recent Labs Lab 10/18/15 0315 10/20/15 0625 10/20/15 2101 10/21/15 0530  WBC 10.3 9.6  --  13.4*  HGB 7.7* 7.1* 8.1* 8.8*  HCT 25.1* 23.1* 25.9* 27.4*  PLT 202 179  --  178    Recent Labs Lab 10/17/15 1525 10/18/15 0315 10/19/15 0228 10/20/15 0625 10/21/15 0530  NA 138 136 133* 130* 130*  K 4.4 4.4 3.9 3.7 3.5  CL 103 101 95*  94* 93*  CO2 GLUCOSE 144* 223* 195* 270* 264*  BUN 21* 30*  CREATININE 1.20 1.46* 1.46* 1.45* 1.41*  CALCIUM 8.7* 8.3* 8.1* 8.0* 7.8*  ALKPHOS  --  110  --   --   --   AST  --  14*  --   --   --   ALT  --  9*  --   --   --   ALBUMIN  --  2.7*  --   --   --     Dg Chest 2 View  10/17/2015  CLINICAL DATA:  Shortness of breath EXAM: CHEST  2 VIEW COMPARISON:  09/20/2015 FINDINGS: Low lung volumes with vascular crowding. Superimposed mild interstitial edema is suspected. Moderate bilateral pleural effusions. Associated bilateral lower  lobe opacities, likely atelectasis. No pneumothorax. Cardiomegaly. IMPRESSION: Low lung volumes. Cardiomegaly with suspected mild interstitial edema. Moderate bilateral pleural effusions. Associated bilateral lower lobe opacities, likely atelectasis. Electronically Signed   By: Charline Bills M.D.   On: 10/17/2015 17:08   Results/Tests Pending at Time of Discharge: none  Discharge Medications:    Medication List    TAKE these medications        albuterol 108 (90 BASE) MCG/ACT inhaler  Commonly known as:  PROVENTIL HFA;VENTOLIN HFA  Inhale 2 puffs into the lungs every 6 (six) hours as needed for wheezing or shortness of breath.     carvedilol 3.125 MG tablet  Commonly known as:  COREG  Take 1 tablet (3.125 mg total) by mouth 2 (two) times daily with a meal.     colchicine 0.6 MG tablet  Take 1 tablet (0.6 mg total) by mouth daily.     Fluticasone Furoate-Vilanterol 100-25 MCG/INH Aepb  Commonly known as:  BREO ELLIPTA  Inhale 1 puff into the lungs daily.     furosemide 40 MG tablet  Commonly known as:  LASIX  Take 1.5 tablets (60 mg total) by mouth 2 (two) times daily.     gabapentin 300 MG capsule  Commonly known as:  NEURONTIN  Take 600 mg by mouth 2 (two) times daily.     glimepiride 2 MG tablet  Commonly known as:  AMARYL  Take 1 tablet (2 mg total) by mouth daily with breakfast.     ipratropium 0.02 % nebulizer solution  Commonly known as:  ATROVENT  Take 2.5 mLs (0.5 mg total) by nebulization every 6 (six) hours as needed for wheezing or shortness of breath.     levofloxacin 500 MG tablet  Commonly known as:  LEVAQUIN  Take 1 tablet (500 mg total) by mouth daily.     lisinopril 2.5 MG tablet  Commonly known as:  PRINIVIL,ZESTRIL  Take 1 tablet (2.5 mg total) by mouth daily.     omeprazole 20 MG capsule  Commonly known as:  PRILOSEC  Take 1 capsule (20 mg total) by mouth daily.     oxyCODONE-acetaminophen 5-325 MG tablet  Commonly known as:  ROXICET  Take  1 tablet by mouth every 12 (twelve) hours as needed for severe pain.     predniSONE 20 MG tablet  Commonly known as:  DELTASONE  Take 2 tablets (40 mg total) by mouth daily with breakfast.     pregabalin 100 MG capsule  Commonly known as:  LYRICA  Take 1 capsule (100 mg total) by mouth 2 (two) times daily.     silver sulfADIAZINE 1 % cream  Commonly known as:  SILVADENE  Apply 1 application topically daily.     sitaGLIPtin 50 MG tablet  Commonly known as:  JANUVIA  Take 1 tablet (50 mg total) by mouth daily. For diabetes        Discharge Instructions: Please refer to Patient Instructions section of EMR for full details.  Patient was counseled important signs and symptoms that should prompt return to medical care, changes in medications, dietary instructions, activity restrictions, and follow up appointments.   Follow-Up Appointments: Follow-up Information    Follow up with [facility] On 11/01/2015.   Why:  Hospital follow-up appointment on 11/01/15 at 3:45 pm with Dr. Venetia Night.   Contact information:   [address] [place] [address] [phone]      Follow up with Ambrose Finland, NP. Schedule an appointment as soon as possible for a visit in 1 week.   Specialty:  Internal Medicine   Why:  For hospital follow-up   Contact information:   [address] [place] [address] [phone]       Marquette Saa, MD 10/21/2015, 11:56 AM PGY-1, [facility] Family Medicine

## 2015-10-19 NOTE — Progress Notes (Signed)
[facility]-25[facility] RN Visit  This is a related admission to [facility] diagnosis of heart failure. Patient is a Full Code. Patient seen in room, resting in bed.  He is much more alert today and able to participate in conversation.  He stated he had nausea/vomiting and diarrhea yesterday.  He stated improvement in these symptoms, however complains of continued weakness.  Discussed if patient would be interested in SNF for rehab upon hospital discharge, and he stated he only wants to go back home.  Discussed concern that he may need increased supervision, as 24 hour supervision is not an option with home hospice support.  He stated he did not want to discuss this any further and covered his face with a blanket.  Will update [facility] social worker, Shawna OrleansMelanie regarding conversation.   [facility] will continue to follow and anticipate any discharge needs.  Please call with any questions or concerns.  Thank you, Hessie KnowsStacie Wilkinson RN, [facility] Hospital Liaison [phone]

## 2015-10-19 NOTE — Progress Notes (Signed)
PROGRESS NOTE  Subjective:   Pt of Dr. Delton SeeNelson  ( when he shows up for OV)    Pt admitted with recurrent CHF Refuses to take his meds at home Agrees to take meds when I asked this am.    Objective:    Vital Signs:   Temp:  [97.2 F (36.2 C)-98 F (36.7 C)] 97.9 F (36.6 C) (12/14 0800) Pulse Rate:  [77-98] 93 (12/14 0800) Resp:  [9-25] 25 (12/14 0800) BP: (88-119)/(52-78) 111/65 mmHg (12/14 0800) SpO2:  [86 %-100 %] 100 % (12/14 0800) Weight:  [152 lb 12.5 oz (69.3 kg)] 152 lb 12.5 oz (69.3 kg) (12/14 0401)  Last BM Date: 10/18/15   24-hour weight change: Weight change: -8 lb 3.5 oz (-3.729 kg)  Weight trends: Filed Weights   10/17/15 2100 10/18/15 0533 10/19/15 0401  Weight: 157 lb 3 oz (71.3 kg) 149 lb 4 oz (67.7 kg) 152 lb 12.5 oz (69.3 kg)    Intake/Output:  12/13 0701 - 12/14 0700 In: 710 [P.O.:600; I.V.:110] Out: 1625 [Urine:1625]     Physical Exam: BP 111/65 mmHg  Pulse 93  Temp(Src) 97.9 F (36.6 C) (Oral)  Resp 25  Ht 5\' 9"  (1.753 m)  Wt 152 lb 12.5 oz (69.3 kg)  BMI 22.55 kg/m2  SpO2 100%  Wt Readings from Last 3 Encounters:  10/19/15 152 lb 12.5 oz (69.3 kg)  09/27/15 161 lb 8 oz (73.256 kg)  09/16/15 166 lb 11.2 oz (75.615 kg)    General: Vital signs reviewed and noted.   Head: Normocephalic, atraumatic.  Eyes: conjunctivae/corneas clear.  EOM's intact.   Throat: normal  Neck:  normal   Lungs:    clear   Heart:  RR   Abdomen:  Soft, non-tender, non-distended    Extremities: No edema    Neurologic: A&O X3, CN II - XII are grossly intact.   Psych: Normal     Labs: BMET:  Recent Labs  10/18/15 0315 10/19/15 0228  NA 136 133*  K 4.4 3.9  CL 101 95*  CO2 27 29  GLUCOSE 223* 195*  BUN 9 15  CREATININE 1.46* 1.46*  CALCIUM 8.3* 8.1*    Liver function tests:  Recent Labs  10/18/15 0315  AST 14*  ALT 9*  ALKPHOS 110  BILITOT 2.4*  PROT 6.7  ALBUMIN 2.7*   No results for input(s): LIPASE, AMYLASE in the  last 72 hours.  CBC:  Recent Labs  10/17/15 1525 10/18/15 0315  WBC 10.2 10.3  NEUTROABS 4.2 3.0  HGB 8.7* 7.7*  HCT 28.2* 25.1*  MCV 104.4* 104.1*  PLT 218 202    Cardiac Enzymes:  Recent Labs  10/17/15 1525 10/18/15 0035 10/18/15 0315  TROPONINI <0.03 0.06* 0.06*    Coagulation Studies: No results for input(s): LABPROT, INR in the last 72 hours.  Other: Invalid input(s): POCBNP No results for input(s): DDIMER in the last 72 hours. No results for input(s): HGBA1C in the last 72 hours. No results for input(s): CHOL, HDL, LDLCALC, TRIG, CHOLHDL in the last 72 hours. No results for input(s): TSH, T4TOTAL, T3FREE, THYROIDAB in the last 72 hours.  Invalid input(s): FREET3  Recent Labs  10/18/15 0315  VITAMINB12 2520*  FOLATE 14.3  FERRITIN 225  TIBC 213*  IRON 69     Other results:   tele  personally reviewed )  - NSR    Medications:    Infusions:    Scheduled Medications: . aspirin EC  81  mg Oral Daily  . budesonide-formoterol  2 puff Inhalation BID  . colchicine  0.6 mg Oral Daily  . furosemide  80 mg Intravenous Daily  . gabapentin  600 mg Oral BID  . heparin  5,000 Units Subcutaneous 3 times per day  . insulin aspart  0-15 Units Subcutaneous TID WC  . levofloxacin  500 mg Oral Daily  . pantoprazole  40 mg Oral Daily  . predniSONE  40 mg Oral Q breakfast  . sodium chloride  3 mL Intravenous Q12H    Assessment/ Plan:   Principal Problem:   Acute systolic CHF (congestive heart failure) (HCC) Active Problems:   Chronic systolic CHF (congestive heart failure) (HCC)   Type II diabetes mellitus with renal manifestations, uncontrolled (HCC)   Chronic kidney disease, stage 3   Personal history of noncompliance with medical treatment   Dementia  1. Acute on chronic systolic CHF:   Largely due to his refusal to take meds.  Will start coreg.   Continue lasix .   Lisinopril 2.5 a day should be added back to his medical regimine  tomorrow.    Disposition:  Length of Stay: 2  Alvia Grove., MD, [facility] 10/19/2015, 8:33 AM Office [phone] Pager [phone]

## 2015-10-20 LAB — GLUCOSE, CAPILLARY
GLUCOSE-CAPILLARY: 249 mg/dL — AB (ref 65–99)
Glucose-Capillary: 226 mg/dL — ABNORMAL HIGH (ref 65–99)
Glucose-Capillary: 254 mg/dL — ABNORMAL HIGH (ref 65–99)
Glucose-Capillary: 126 mg/dL — ABNORMAL HIGH (ref 65–99)

## 2015-10-20 LAB — BASIC METABOLIC PANEL
ANION GAP: 8 (ref 5–15)
BUN: 21 mg/dL — ABNORMAL HIGH (ref 6–20)
CALCIUM: 8 mg/dL — AB (ref 8.9–10.3)
CHLORIDE: 94 mmol/L — AB (ref 101–111)
CO2: 28 mmol/L (ref 22–32)
CREATININE: 1.45 mg/dL — AB (ref 0.61–1.24)
GFR calc non Af Amer: 47 mL/min — ABNORMAL LOW (ref >60)
GFR, EST AFRICAN AMERICAN: 54 mL/min — AB (ref >60)
Glucose, Bld: 270 mg/dL — ABNORMAL HIGH (ref 65–99)
Potassium: 3.7 mmol/L (ref 3.5–5.1)
SODIUM: 130 mmol/L — AB (ref 135–145)

## 2015-10-20 LAB — CBC
HCT: 23.1 % — ABNORMAL LOW (ref 39.0–52.0)
Hemoglobin: 7.1 g/dL — ABNORMAL LOW (ref 13.0–17.0)
MCH: 31.7 pg (ref 26.0–34.0)
MCHC: 30.7 g/dL (ref 30.0–36.0)
MCV: 103.1 fL — ABNORMAL HIGH (ref 78.0–100.0)
PLATELETS: 179 10*3/uL (ref 150–400)
RBC: 2.24 MIL/uL — AB (ref 4.22–5.81)
RDW: 21.5 % — ABNORMAL HIGH (ref 11.5–15.5)
WBC: 9.6 10*3/uL (ref 4.0–10.5)

## 2015-10-20 LAB — PREPARE RBC (CROSSMATCH)

## 2015-10-20 LAB — HEMOGLOBIN A1C
HEMOGLOBIN A1C: 11.2 % — AB (ref 4.8–5.6)
Mean Plasma Glucose: 275 mg/dL

## 2015-10-20 LAB — RETICULOCYTES
RBC.: 2.27 MIL/uL — ABNORMAL LOW (ref 4.22–5.81)
RETIC CT PCT: 2.3 % (ref 0.4–3.1)
Retic Count, Absolute: 52.2 10*3/uL (ref 19.0–186.0)

## 2015-10-20 LAB — ABO/RH: ABO/RH(D): O POS

## 2015-10-20 LAB — HEMOGLOBIN AND HEMATOCRIT, BLOOD
HCT: 25.9 % — ABNORMAL LOW (ref 39.0–52.0)
Hemoglobin: 8.1 g/dL — ABNORMAL LOW (ref 13.0–17.0)

## 2015-10-20 MED ORDER — DIPHENHYDRAMINE HCL 25 MG PO CAPS
25.0000 mg | ORAL_CAPSULE | Freq: Once | ORAL | Status: AC
  Administered 2015-10-20: 25 mg via ORAL
  Filled 2015-10-20: qty 1

## 2015-10-20 MED ORDER — INSULIN ASPART 100 UNIT/ML ~~LOC~~ SOLN
0.0000 [IU] | Freq: Three times a day (TID) | SUBCUTANEOUS | Status: DC
Start: 2015-10-20 — End: 2015-10-21
  Administered 2015-10-20: 11 [IU] via SUBCUTANEOUS
  Administered 2015-10-20: 7 [IU] via SUBCUTANEOUS
  Administered 2015-10-21: 4 [IU] via SUBCUTANEOUS
  Administered 2015-10-21: 7 [IU] via SUBCUTANEOUS

## 2015-10-20 MED ORDER — INSULIN ASPART 100 UNIT/ML ~~LOC~~ SOLN: 0.0000 [IU] | Freq: Every day | SUBCUTANEOUS | Status: DC

## 2015-10-20 MED ORDER — SODIUM CHLORIDE 0.9 % IV SOLN: Freq: Once | INTRAVENOUS | Status: DC

## 2015-10-20 MED ORDER — ACETAMINOPHEN 325 MG PO TABS
650.0000 mg | ORAL_TABLET | Freq: Once | ORAL | Status: AC
  Administered 2015-10-20: 650 mg via ORAL
  Filled 2015-10-20: qty 2

## 2015-10-20 MED ORDER — INSULIN ASPART 100 UNIT/ML ~~LOC~~ SOLN: 0.0000 [IU] | Freq: Three times a day (TID) | SUBCUTANEOUS | Status: DC

## 2015-10-20 NOTE — Progress Notes (Signed)
OT Cancellation Note  Patient Details Name: Russell Hamilton MRN: [id number] DOB: 05/17/1941   Cancelled Treatment:    Reason Eval/Treat Not Completed: Pt adamantly refusing OT stating he is hungry - unable to redirect.   Angelene GiovanniConarpe, Minal Stuller M  [place], OTR/L [phone]  10/20/2015, 12:36 PM

## 2015-10-20 NOTE — Progress Notes (Signed)
Pt received 1 unit of bld

## 2015-10-20 NOTE — Progress Notes (Signed)
Family Medicine Teaching Service Daily Progress Note Intern Pager: [phone]  Patient name: Russell Hamilton Medical record number: [id number] Date of birth: 11/08/1940 Age: 74 y.o. Gender: male  Primary Care Provider: Ambrose FinlandValerie A Keck, NP Consultants: Cardiology, palliative care Code Status: Full  Pt Overview and Major Events to Date:  12/12 - Admitted for SOB 12/15 - Received 1U PRBC  Assessment and Plan: Russell Hamilton is a 74 y.o. male presenting with SOB. 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   Dyspnea: Most likely multifactorial due to heart failure exacerbation, COPD exacerbation, coronary hypertension and anemia. EKG: Sinus tachycardia with inverted T waves the V4 - V6 , unchanged from previous and consistent with LV strain. Chest x-ray consistent with interstitial edema and moderate bilateral pleural effusions. Less likely pneumonia (WBC WNL, afebrile).Low suspicion for PE (Well's Score 1.5). Troponins negative x1, 0.06 x2. Lactic acid: 4.84 > 2.55>2.4>2.5 (persistent elevation likely due to end-stage CHF).  - Treating HF and COPD exacerbations as below  Cardiac: Acute heart failure exacerbation. HFrEF Echo 07/2015: EF 10-15%, diffuse hypokinesis, mild aortic stenosis. Long history of heart failure noncompliance; not on Beta Blocker or ACE-I (stopped on last discharge 09/27/15). BNP 1715 on admission. Daily wt: 161lbs (admit)>158 lbs today (dry wt unknown). Cardiology consulted, and felt that patient is not a candidate for IV inotropes but should continues IV diuresis. Per cards, since pt does not take his medications, he will continue to have exacerbations and decline.  - Continue to monitor blood pressure and electrolytes with diuresis - Strict I&O's, daily weights - Cont home dose of PO Lasix 60 mg BID and lisinopril 2.5mg  qd  Resp: COPD exacerbation, pulmonary hypertension, current tobacco abuse. Pt received vanc/zosyn in ED  for presumed HCAP but was transitioned to levaquin for more specific coverage. No growth on blood or urine cultures to date. O2 sats in 90s on 3LNC but patient reports SOB.  - Continue [place] O2; wean as tolerated. Home O2 requirement 2L. - F/u urine, blood cx - Continue home: Symbicort - Cont levaquin 500 mg qd for more specific coverage of COPD exacerbation as well as PNA - on day 4/7 (12/13>>) - Prednisone 40mg  qd x 5 days (12/12>>)- on day 4/5 - Duonebs q6hrs prn   Neuro: Lewy body dementia; hx of CVAs and seizure. Alert and oriented on admission. Previously evaluated by psych during last admission and found to have capacity. No new neurological symptoms.Not on anticonvulsants. EEG from last admit was normal. - Start ASA 81mg  qd - Consider statin  Left elbow pain: Reports new left elbow pain.Reports history of falls at home. Last admission, complained of right elbow pain. Pt reports continued but improved pain but continues to decline elbow xray. - Continue home Roxicet q4hrs prn. Home dose BID - Consider gout (uric acid 8.8 11/201/6): cont colchicine (CrCl 57)  Bilateral Leg pain: Possibly due to neuropathy secondary to diabetes or malnutrition versus claudication (Current smoker; weak pulses in feet). B12 elevated. ABI 0.77 on R, 0.66 on L. - Continue home gabapentin 600mg  BID; titrate up as needed - Discontinue Lyrica  CKD Stage 3b: (Cr baseline 1.4). Pt reports noncompliance with ACEI. On admission was 1.2. Is 1.41 this AM. - Monitor creatinine with diuresis - Consider discontinuing PPI if not symptomatic for GERD  DM2: A1c 8.2 (07/13/15). Hyperglycemia in setting of steriods. CBGs 243 overnight. - Holding Amaryl and Januvia; patient reports non-compliance - Increased to resistant SSI - Monitor  in setting of steroids  Anemia (Macrocytic): Hbg 8.7 on admission (baseline 13, 06/2015), MCV 104, RDW elevated. Patient denying any blood loss. Iron panel, ferritin, folate normal; B12  elevated. Smear showed findings concerning for a primary hematologic disorder. Hgb decreased to 7.1 yesterday so patient received 1U PRBC. FOBT negative. H&H this AM post-transfusion is 8.8.  - Monitor - Transfusion threshold 7  Hypocalcemia: Ca 7.8 this morning. Asymptomatic. Corrected Ca 8.8. - Continue to monitor  Social: Lives alone; niece checks on him and has [facility] hospice nurse come out 2x a week. PT/OT recommended SNF, but patient refuses.  - Consult Case Management   FEN/GI: Heart Healthy; SLIV Prophylaxis: Subq heparin  Disposition: Home pending improvement. Patient refusing SNF.  Subjective:  Patient says he still feels SOB this AM. He endorses generalized pain and difficulty moving around in bed, however he has denied SNF placement and additional home health, and has refused to work with PT. He says he does not want to go home today.   Objective: Temp:  [97.5 F (36.4 C)-98.1 F (36.7 C)] 98.1 F (36.7 C) (12/16 0416) Pulse Rate:  [48-91] 91 (12/16 0416) Resp:  [17-20] 17 (12/16 0416) BP: (91-119)/(58-70) 119/70 mmHg (12/16 0416) SpO2:  [92 %-100 %] 96 % (12/16 0416) Weight:  [164 lb 6.4 oz (74.571 kg)] 164 lb 6.4 oz (74.571 kg) (12/16 0416) Physical Exam: General: sitting up in bed in NAD Cardiovascular: RRR, no murmurs appreciated  Respiratory: Diffusely decreased breath sounds however improved from yesterday. Regular work of breathing on 3LNC Abdomen: soft, non-tender, non-distended, +BS MSK: moving all extremities spontaneously Neuro: A&Ox4; no focal deficits  Laboratory:  Recent Labs Lab 10/18/15 0315 10/20/15 0625 10/20/15 2101 10/21/15 0530  WBC 10.3 9.6  --  13.4*  HGB 7.7* 7.1* 8.1* 8.8*  HCT 25.1* 23.1* 25.9* 27.4*  PLT 202 179  --  178    Recent Labs Lab 10/18/15 0315 10/19/15 0228 10/20/15 0625 10/21/15 0530  NA 136 133* 130* 130*  K 4.4 3.9 3.7 3.5  CL 101 95* 94* 93*  CO2 BUN 9 15 21* 30*  CREATININE 1.46* 1.46* 1.45*  1.41*  CALCIUM 8.3* 8.1* 8.0* 7.8*  PROT 6.7  --   --   --   BILITOT 2.4*  --   --   --   ALKPHOS 110  --   --   --   ALT 9*  --   --   --   AST 14*  --   --   --   GLUCOSE 223* 195* 270* 264*   Trop neg>0.06 x3 BNP 1715.9 Lactic Acid - 4.84>2.55>2.4>2.5  Imaging/Diagnostic Tests: Dg Chest 2 View  10/17/2015  CLINICAL DATA:  Shortness of breath EXAM: CHEST  2 VIEW COMPARISON:  09/20/2015 FINDINGS: Low lung volumes with vascular crowding. Superimposed mild interstitial edema is suspected. Moderate bilateral pleural effusions. Associated bilateral lower lobe opacities, likely atelectasis. No pneumothorax. Cardiomegaly. IMPRESSION: Low lung volumes. Cardiomegaly with suspected mild interstitial edema. Moderate bilateral pleural effusions. Associated bilateral lower lobe opacities, likely atelectasis. Electronically Signed   By: Charline Bills M.D.   On: 10/17/2015 17:08   Marquette Saa, MD 10/21/2015, 9:09 AM PGY-1, [facility] Family Medicine FPTS Intern pager: [phone], text pages welcome

## 2015-10-20 NOTE — Progress Notes (Signed)
PROGRESS NOTE  Subjective:   Pt of Dr. Delton SeeNelson  ( when he shows up for OV)    Pt admitted with recurrent CHF Refuses to take his meds at home Agrees to take meds when I asked this am.  No new issues  Objective:    Vital Signs:   Temp:  [97.4 F (36.3 C)-99.5 F (37.5 C)] 97.5 F (36.4 C) (12/15 0706) Pulse Rate:  [84-95] 92 (12/15 0706) Resp:  [14-18] 18 (12/15 0706) BP: (84-116)/(57-75) 110/62 mmHg (12/15 0706) SpO2:  [90 %-100 %] 92 % (12/15 0706) Weight:  [156 lb 8.4 oz (71 kg)-158 lb 4.6 oz (71.8 kg)] 158 lb 4.6 oz (71.8 kg) (12/15 0655)  Last BM Date: 10/18/15   24-hour weight change: Weight change: 3 lb 12 oz (1.7 kg)  Weight trends: Filed Weights   10/19/15 0401 10/19/15 1629 10/20/15 0655  Weight: 152 lb 12.5 oz (69.3 kg) 156 lb 8.4 oz (71 kg) 158 lb 4.6 oz (71.8 kg)    Intake/Output:  12/14 0701 - 12/15 0700 In: 1280 [P.O.:1280] Out: 770 [Urine:770] Total I/O In: 240 [P.O.:240] Out: -    Physical Exam: BP 110/62 mmHg  Pulse 92  Temp(Src) 97.5 F (36.4 C) (Oral)  Resp 18  Ht 5\' 9"  (1.753 m)  Wt 158 lb 4.6 oz (71.8 kg)  BMI 23.36 kg/m2  SpO2 92%  Wt Readings from Last 3 Encounters:  10/20/15 158 lb 4.6 oz (71.8 kg)  09/27/15 161 lb 8 oz (73.256 kg)  09/16/15 166 lb 11.2 oz (75.615 kg)    General: Vital signs reviewed and noted.   Head: Normocephalic, atraumatic.  Eyes: conjunctivae/corneas clear.  EOM's intact.   Throat: normal  Neck:  normal   Lungs:    clear   Heart:  RR   Abdomen:  Soft, non-tender, non-distended    Extremities: No edema    Neurologic: A&O X3, CN II - XII are grossly intact.   Psych: Normal     Labs: BMET:  Recent Labs  10/19/15 0228 10/20/15 0625  NA 133* 130*  K 3.9 3.7  CL 95* 94*  CO2 29 28  GLUCOSE 195* 270*  BUN 15 21*  CREATININE 1.46* 1.45*  CALCIUM 8.1* 8.0*    Liver function tests:  Recent Labs  10/18/15 0315  AST 14*  ALT 9*  ALKPHOS 110  BILITOT 2.4*  PROT 6.7  ALBUMIN  2.7*   No results for input(s): LIPASE, AMYLASE in the last 72 hours.  CBC:  Recent Labs  10/17/15 1525 10/18/15 0315 10/20/15 0625  WBC 10.2 10.3 9.6  NEUTROABS 4.2 3.0  --   HGB 8.7* 7.7* 7.1*  HCT 28.2* 25.1* 23.1*  MCV 104.4* 104.1* 103.1*  PLT 218 202 179    Cardiac Enzymes:  Recent Labs  10/17/15 1525 10/18/15 0035 10/18/15 0315  TROPONINI <0.03 0.06* 0.06*    Coagulation Studies: No results for input(s): LABPROT, INR in the last 72 hours.  Other: Invalid input(s): POCBNP No results for input(s): DDIMER in the last 72 hours.  Recent Labs  10/18/15 0315  HGBA1C 11.2*   No results for input(s): CHOL, HDL, LDLCALC, TRIG, CHOLHDL in the last 72 hours. No results for input(s): TSH, T4TOTAL, T3FREE, THYROIDAB in the last 72 hours.  Invalid input(s): FREET3  Recent Labs  10/18/15 0315  VITAMINB12 2520*  FOLATE 14.3  FERRITIN 225  TIBC 213*  IRON 69     Other results:   tele  personally  reviewed )  - NSR    Medications:    Infusions:    Scheduled Medications: . aspirin EC  81 mg Oral Daily  . budesonide-formoterol  2 puff Inhalation BID  . carvedilol  3.125 mg Oral BID WC  . colchicine  0.6 mg Oral Daily  . furosemide  60 mg Oral BID  . gabapentin  600 mg Oral BID  . heparin  5,000 Units Subcutaneous 3 times per day  . insulin aspart  0-15 Units Subcutaneous TID WC  . levofloxacin  500 mg Oral Daily  . lisinopril  2.5 mg Oral Daily  . pantoprazole  40 mg Oral Daily  . predniSONE  40 mg Oral Q breakfast  . sodium chloride  3 mL Intravenous Q12H    Assessment/ Plan:   Principal Problem:   Acute systolic CHF (congestive heart failure) (HCC) Active Problems:   Chronic systolic CHF (congestive heart failure) (HCC)   Type II diabetes mellitus with renal manifestations, uncontrolled (HCC)   Chronic kidney disease, stage 3   Personal history of noncompliance with medical treatment   Dementia   SOB (shortness of breath)   Left elbow  pain  1. Acute on chronic systolic CHF:   Largely due to his refusal to take meds.  Continue meds. No new recs He needs to follow up with his general medial doctor  Follow up with Dr. Delton See    Will sign off.   Call for questions  Disposition:  Length of Stay: 3  Vesta Mixer, Montez Hageman., MD, [facility] 10/20/2015, 9:30 AM Office [phone] Pager [phone]

## 2015-10-20 NOTE — Progress Notes (Signed)
Pt agitated this am from people coming in asking him about SNF placement, pt refused all morning meds and asked to be "left alone"

## 2015-10-20 NOTE — Hospital Discharge Follow-Up (Signed)
[facility] [place]:  Patient known to the [facility] and [employer] and currently receiving home hospice services. Case conferenced with Olga Coaster, RN CM who indicated plan was for patient to resume home hospice services at discharge. Met with patient at bedside; patient frustrated that people keep coming "in and all of room". Patient indicated he planned to return home upon discharge. Refused SNF placement. Attempted to determine if patient was taking his home medications prior to admission; however, patient refused to answer question. Patient did indicate he was using home oxygen prior to admission.  Discussed hospital follow-up appointment with Dr. Jarold Song at [facility], and patient agreeable.  Appointment made for 11/01/15 at 1545 with Dr. Jarold Song.  Appointment placed on AVS. Will continue to follow patient's hospital progress closely.

## 2015-10-20 NOTE — Progress Notes (Signed)
PT Cancellation Note  Patient Details Name: Nat MathJames Sharron MRN: [id number] DOB: 12/15/1940   Cancelled Treatment:    Reason Eval/Treat Not Completed: Pain limiting ability to participate; patient requesting to be left alone.  Reports in pain in arm and all over and wishes to rest.  Declined any assistance for repositioning or mobility.   Elray McgregorCynthia Wynn 10/20/2015, 3:18 PM  Sheran Lawlessyndi Wynn, [place] [phone] 10/20/2015

## 2015-10-20 NOTE — Care Management Note (Signed)
Case Management Note  Patient Details  Name: Russell Hamilton MRN: [id number] Date of Birth: 07/16/1941  Subjective/Objective:     Admitted with CHF               Action/Plan: [facility]. CM entered room, pt pulled sheet over his head and refused to talk.  Expected Discharge Date:   possibly 10/21/2015               Expected Discharge Plan:  Home w Hospice Care  Discharge planning Services  CM Consult Choice offered to:  Patient   [facility]  [facility] [place]  Status of Service:  In process, will continue to follow  Medicare Important Message Given:  Yes  Reola MosherChandler, Liviah Cake L, RN,MHA,BSN [phone] 10/20/2015, 2:09 PM

## 2015-10-20 NOTE — Care Management Important Message (Signed)
Important Message  Patient Details  Name: Nat MathJames Tunks MRN: [id number] Date of Birth: 10/12/1941   Medicare Important Message Given:  Yes    Rayvon CharSTUTTS, Sonni Barse G 10/20/2015, 12:14 PMImportant Message  Patient Details  Name: Nat MathJames Clavijo MRN: [id number] Date of Birth: 07/27/1941   Medicare Important Message Given:  Yes    Raenell Mensing G 10/20/2015, 12:14 PM

## 2015-10-20 NOTE — Clinical Documentation Improvement (Signed)
Family Medicine  Abnormal Lab/Test Results:  Sodium 130 / 132  Possible Clinical Conditions associated with below indicators   Hyponatremia  Other Condition  Cannot Clinically Determine   Evaluation : BMET daily     Please exercise your independent, professional judgment when responding. A specific answer is not anticipated or expected.   Thank You,  Lavonda JumboLawanda J Birda [facility] [place] [phone]

## 2015-10-20 NOTE — Progress Notes (Signed)
[facility] Chaplain Visit: Pt resting in bed, drowsy and uncomfortable in affect, acknowledging chaplain but not open to conversation and wanting to rest.  Pt indicated he was interested in a visit at another time, and welcomed chaplain providing general prayer of blessing. Lovenia ShuckJohn Connor, ThM, [facility] Chaplain

## 2015-10-20 NOTE — Progress Notes (Signed)
[facility] 2H-25-[facility] RN Visit  This is a related admission to [facility] diagnosis of heart failure. Patient is a Full Code. Patient seen in room, resting with head covered with a blanket.  Patient stated he feels short of breath and his oxygen is now increased to 3L [place].  He stated he continues to have abdominal discomfort and had an episode of diarrhea last night.  He also complains of left elbow pain and lower extremity edema.  He verbalized desire to go home after these symptoms have improved.   [facility] will continue to follow and anticipate any discharge needs.  Please call with any questions or concerns.  Thank you, Hessie KnowsStacie Wilkinson RN, [facility] Hospital Liaison [phone]

## 2015-10-21 LAB — CBC
HEMATOCRIT: 27.4 % — AB (ref 39.0–52.0)
Hemoglobin: 8.8 g/dL — ABNORMAL LOW (ref 13.0–17.0)
MCH: 32.4 pg (ref 26.0–34.0)
MCHC: 32.1 g/dL (ref 30.0–36.0)
MCV: 100.7 fL — ABNORMAL HIGH (ref 78.0–100.0)
PLATELETS: 178 10*3/uL (ref 150–400)
RBC: 2.72 MIL/uL — ABNORMAL LOW (ref 4.22–5.81)
RDW: 21.6 % — AB (ref 11.5–15.5)
WBC: 13.4 10*3/uL — AB (ref 4.0–10.5)

## 2015-10-21 LAB — BASIC METABOLIC PANEL
ANION GAP: 10 (ref 5–15)
BUN: 30 mg/dL — ABNORMAL HIGH (ref 6–20)
CO2: 27 mmol/L (ref 22–32)
CREATININE: 1.41 mg/dL — AB (ref 0.61–1.24)
Calcium: 7.8 mg/dL — ABNORMAL LOW (ref 8.9–10.3)
Chloride: 93 mmol/L — ABNORMAL LOW (ref 101–111)
GFR, EST AFRICAN AMERICAN: 56 mL/min — AB (ref >60)
GFR, EST NON AFRICAN AMERICAN: 48 mL/min — AB (ref >60)
Glucose, Bld: 264 mg/dL — ABNORMAL HIGH (ref 65–99)
Potassium: 3.5 mmol/L (ref 3.5–5.1)
SODIUM: 130 mmol/L — AB (ref 135–145)

## 2015-10-21 LAB — TYPE AND SCREEN
ABO/RH(D): O POS
Antibody Screen: NEGATIVE
UNIT DIVISION: 0

## 2015-10-21 LAB — OCCULT BLOOD X 1 CARD TO LAB, STOOL: FECAL OCCULT BLD: NEGATIVE

## 2015-10-21 LAB — GLUCOSE, CAPILLARY
GLUCOSE-CAPILLARY: 243 mg/dL — AB (ref 65–99)
GLUCOSE-CAPILLARY: 194 mg/dL — AB (ref 65–99)

## 2015-10-21 MED ORDER — TRAZODONE HCL 50 MG PO TABS
50.0000 mg | ORAL_TABLET | Freq: Every day | ORAL | Status: DC
  Administered 2015-10-21: 50 mg via ORAL
  Filled 2015-10-21: qty 1

## 2015-10-21 MED ORDER — LEVOFLOXACIN 500 MG PO TABS: 500.0000 mg | ORAL_TABLET | Freq: Every day | ORAL | Status: DC

## 2015-10-21 MED ORDER — LISINOPRIL 2.5 MG PO TABS: 2.5000 mg | ORAL_TABLET | Freq: Every day | ORAL | Status: DC

## 2015-10-21 MED ORDER — CARVEDILOL 3.125 MG PO TABS: 3.1250 mg | ORAL_TABLET | Freq: Two times a day (BID) | ORAL | Status: DC

## 2015-10-21 MED ORDER — PREDNISONE 20 MG PO TABS: 40.0000 mg | ORAL_TABLET | Freq: Every day | ORAL | Status: DC

## 2015-10-21 MED ORDER — COLCHICINE 0.6 MG PO TABS: 0.6000 mg | ORAL_TABLET | Freq: Every day | ORAL | Status: AC

## 2015-10-21 NOTE — Progress Notes (Signed)
[facility] called and is aware of discharge home today and will [facility] the patient at his home. Abelino DerrickB Lexee Brashears [facility] CenterRN,MHA,BSN [phone]

## 2015-10-21 NOTE — Progress Notes (Signed)
[facility] Social Work note Patient was resting in bed, eating lunch. He shared plan to go home. He voiced no needs, concerns. [facility] staff will continue to follow.  Orson GearMelanie Fuqua, [place] [phone]

## 2015-10-21 NOTE — Progress Notes (Signed)
Pt has orders to be discharged. Discharge instructions given and pt has no additional questions at this time. Patient refused medication review and said he would look at it when he gets home. Telemetry box removed. IV's removed and sites in good condition. Pt stable and waiting for transportation.

## 2015-10-21 NOTE — Progress Notes (Signed)
CSW attempted to met with patient this morning due to PT's recommendation for SNF.  Patient kept his eyes closed during visit at would only answer with one word sentences at first.  Within a minute- patient became very angry and stated loudly  "I'm sick of you people coming and asking me the same questions!  I have told them and told them I AM NOT going to a nursing home and I mean it!  Why can't you people get that into your thick heads?"  CSW attempted to provide support and to explain PT recommendations etc- however this only seems to anger patient more.  "GET OUT and I don't want to talk to any of you about this anymore!"  CSW left room and updated unit RNCM. DC plan will be for patient to return home with possible home hospice to continue (if he will allow it.)  Butch Penny T. Pauline Good, [place]

## 2015-10-21 NOTE — Discharge Instructions (Signed)
You were hospitalized for difficulty breathing. You were found to have pneumonia, and started on antibiotics. It also looked like you were having a COPD exacerbation (worsening of your lung disease), which was also making it difficulty for you to breathe. For this, you were started on steroids. You were also found to be anemic, and received one blood transfusion.   Please continue to take the antibiotics (levaquin) for three more days (last dose 10/24/15).  Please continue to take the steroids (prednisone) for one more day (last dose 10/22/15).  Also, you were started on two new medications (Coreg and lisinopril). Please continue to take those in addition to the medications you were taking before you were hospitalized.   Make sure to schedule a hospital follow-up appointment with your regular doctor in about one week.

## 2015-10-21 NOTE — Progress Notes (Signed)
PT Cancellation Note  Patient Details Name: Nat MathJames Boreman MRN: [id number] DOB: 10/02/1941   Cancelled Treatment:    Reason Eval/Treat Not Completed: Patient declined, no reason specified. Pt refused PT eval despite education on importance of mobility. Pt states "I just want to sit here and watch TV until I go home"   Vineta Carone 10/21/2015, 11:04 AM

## 2015-10-22 LAB — CULTURE, BLOOD (ROUTINE X 2)
CULTURE: NO GROWTH
Culture: NO GROWTH

## 2015-10-29 ENCOUNTER — Encounter (HOSPITAL_COMMUNITY): Payer: Self-pay

## 2015-10-29 ENCOUNTER — Inpatient Hospital Stay (HOSPITAL_COMMUNITY)
Admission: EM | Admit: 2015-10-29 | Discharge: 2015-11-03 | DRG: 293 | Disposition: A | Discharge status: Hospice | Attending: Family Medicine | Admitting: Family Medicine

## 2015-10-29 ENCOUNTER — Emergency Department (HOSPITAL_COMMUNITY)

## 2015-10-29 DIAGNOSIS — M109 Gout, unspecified: Secondary | ICD-10-CM | POA: Diagnosis not present

## 2015-10-29 DIAGNOSIS — Z9119 Patient's noncompliance with other medical treatment and regimen: Secondary | ICD-10-CM

## 2015-10-29 DIAGNOSIS — I509 Heart failure, unspecified: Secondary | ICD-10-CM

## 2015-10-29 DIAGNOSIS — N183 Chronic kidney disease, stage 3 (moderate): Secondary | ICD-10-CM | POA: Diagnosis present

## 2015-10-29 DIAGNOSIS — R0902 Hypoxemia: Secondary | ICD-10-CM | POA: Insufficient documentation

## 2015-10-29 DIAGNOSIS — E1122 Type 2 diabetes mellitus with diabetic chronic kidney disease: Secondary | ICD-10-CM | POA: Diagnosis present

## 2015-10-29 DIAGNOSIS — Z7952 Long term (current) use of systemic steroids: Secondary | ICD-10-CM

## 2015-10-29 DIAGNOSIS — Z8673 Personal history of transient ischemic attack (TIA), and cerebral infarction without residual deficits: Secondary | ICD-10-CM

## 2015-10-29 DIAGNOSIS — J449 Chronic obstructive pulmonary disease, unspecified: Secondary | ICD-10-CM | POA: Diagnosis present

## 2015-10-29 DIAGNOSIS — R0602 Shortness of breath: Secondary | ICD-10-CM | POA: Insufficient documentation

## 2015-10-29 DIAGNOSIS — K219 Gastro-esophageal reflux disease without esophagitis: Secondary | ICD-10-CM | POA: Diagnosis present

## 2015-10-29 DIAGNOSIS — Z66 Do not resuscitate: Secondary | ICD-10-CM | POA: Diagnosis present

## 2015-10-29 DIAGNOSIS — F0390 Unspecified dementia without behavioral disturbance: Secondary | ICD-10-CM | POA: Diagnosis present

## 2015-10-29 DIAGNOSIS — Z7951 Long term (current) use of inhaled steroids: Secondary | ICD-10-CM

## 2015-10-29 DIAGNOSIS — I252 Old myocardial infarction: Secondary | ICD-10-CM

## 2015-10-29 DIAGNOSIS — R0600 Dyspnea, unspecified: Secondary | ICD-10-CM | POA: Diagnosis present

## 2015-10-29 DIAGNOSIS — D539 Nutritional anemia, unspecified: Secondary | ICD-10-CM | POA: Diagnosis present

## 2015-10-29 DIAGNOSIS — I5023 Acute on chronic systolic (congestive) heart failure: Secondary | ICD-10-CM | POA: Diagnosis not present

## 2015-10-29 DIAGNOSIS — Z7984 Long term (current) use of oral hypoglycemic drugs: Secondary | ICD-10-CM

## 2015-10-29 DIAGNOSIS — Z79899 Other long term (current) drug therapy: Secondary | ICD-10-CM

## 2015-10-29 DIAGNOSIS — Z515 Encounter for palliative care: Secondary | ICD-10-CM | POA: Diagnosis present

## 2015-10-29 DIAGNOSIS — R300 Dysuria: Secondary | ICD-10-CM | POA: Diagnosis present

## 2015-10-29 DIAGNOSIS — E1140 Type 2 diabetes mellitus with diabetic neuropathy, unspecified: Secondary | ICD-10-CM | POA: Diagnosis present

## 2015-10-29 DIAGNOSIS — F17210 Nicotine dependence, cigarettes, uncomplicated: Secondary | ICD-10-CM | POA: Diagnosis present

## 2015-10-29 LAB — CBC WITH DIFFERENTIAL/PLATELET
BAND NEUTROPHILS: 4 %
BASOS ABS: 0.1 10*3/uL (ref 0.0–0.1)
BASOS PCT: 1 %
BLASTS: 0 %
EOS ABS: 0 10*3/uL (ref 0.0–0.7)
Eosinophils Relative: 0 %
HEMATOCRIT: 28.1 % — AB (ref 39.0–52.0)
Hemoglobin: 8.8 g/dL — ABNORMAL LOW (ref 13.0–17.0)
LYMPHS ABS: 1.2 10*3/uL (ref 0.7–4.0)
Lymphocytes Relative: 12 %
MCH: 32.1 pg (ref 26.0–34.0)
MCHC: 31.3 g/dL (ref 30.0–36.0)
MCV: 102.6 fL — ABNORMAL HIGH (ref 78.0–100.0)
METAMYELOCYTES PCT: 2 %
MONO ABS: 5.7 10*3/uL — AB (ref 0.1–1.0)
MONOS PCT: 58 %
Myelocytes: 0 %
NEUTROS ABS: 2.9 10*3/uL (ref 1.7–7.7)
Neutrophils Relative %: 23 %
OTHER: 0 %
PLATELETS: 184 10*3/uL (ref 150–400)
Promyelocytes Absolute: 0 %
RBC: 2.74 MIL/uL — ABNORMAL LOW (ref 4.22–5.81)
RDW: 21.2 % — AB (ref 11.5–15.5)
WBC: 9.9 10*3/uL (ref 4.0–10.5)
nRBC: 3 /100 WBC — ABNORMAL HIGH

## 2015-10-29 LAB — URINALYSIS, ROUTINE W REFLEX MICROSCOPIC
GLUCOSE, UA: NEGATIVE mg/dL
HGB URINE DIPSTICK: NEGATIVE
Ketones, ur: NEGATIVE mg/dL
Nitrite: NEGATIVE
PROTEIN: NEGATIVE mg/dL
SPECIFIC GRAVITY, URINE: 1.013 (ref 1.005–1.030)
pH: 5.5 (ref 5.0–8.0)

## 2015-10-29 LAB — COMPREHENSIVE METABOLIC PANEL
ALBUMIN: 2.9 g/dL — AB (ref 3.5–5.0)
ALT: 15 U/L — ABNORMAL LOW (ref 17–63)
ANION GAP: 9 (ref 5–15)
AST: 18 U/L (ref 15–41)
Alkaline Phosphatase: 159 U/L — ABNORMAL HIGH (ref 38–126)
BUN: 14 mg/dL (ref 6–20)
CALCIUM: 8.5 mg/dL — AB (ref 8.9–10.3)
CO2: 30 mmol/L (ref 22–32)
Chloride: 97 mmol/L — ABNORMAL LOW (ref 101–111)
Creatinine, Ser: 1.03 mg/dL (ref 0.61–1.24)
GLUCOSE: 216 mg/dL — AB (ref 65–99)
Potassium: 4 mmol/L (ref 3.5–5.1)
SODIUM: 136 mmol/L (ref 135–145)
TOTAL PROTEIN: 7.2 g/dL (ref 6.5–8.1)
Total Bilirubin: 2.4 mg/dL — ABNORMAL HIGH (ref 0.3–1.2)

## 2015-10-29 LAB — GLUCOSE, CAPILLARY: Glucose-Capillary: 288 mg/dL — ABNORMAL HIGH (ref 65–99)

## 2015-10-29 LAB — URINE MICROSCOPIC-ADD ON

## 2015-10-29 LAB — BRAIN NATRIURETIC PEPTIDE: B NATRIURETIC PEPTIDE 5: 1627.6 pg/mL — AB (ref 0.0–100.0)

## 2015-10-29 LAB — CBC
HCT: 28.2 % — ABNORMAL LOW (ref 39.0–52.0)
Hemoglobin: 8.9 g/dL — ABNORMAL LOW (ref 13.0–17.0)
MCH: 32 pg (ref 26.0–34.0)
MCHC: 31.6 g/dL (ref 30.0–36.0)
MCV: 101.4 fL — AB (ref 78.0–100.0)
PLATELETS: 203 10*3/uL (ref 150–400)
RBC: 2.78 MIL/uL — AB (ref 4.22–5.81)
RDW: 21.1 % — AB (ref 11.5–15.5)
WBC: 10.7 10*3/uL — AB (ref 4.0–10.5)

## 2015-10-29 LAB — CK: Total CK: 28 U/L — ABNORMAL LOW (ref 49–397)

## 2015-10-29 LAB — CREATININE, SERUM
CREATININE: 1.19 mg/dL (ref 0.61–1.24)
GFR, EST NON AFRICAN AMERICAN: 59 mL/min — AB (ref >60)

## 2015-10-29 LAB — I-STAT TROPONIN, ED: TROPONIN I, POC: 0.01 ng/mL (ref 0.00–0.08)

## 2015-10-29 MED ORDER — SODIUM CHLORIDE 0.9 % IJ SOLN: 3.0000 mL | INTRAMUSCULAR | Status: DC | PRN

## 2015-10-29 MED ORDER — BUDESONIDE-FORMOTEROL FUMARATE 80-4.5 MCG/ACT IN AERO
2.0000 | INHALATION_SPRAY | Freq: Two times a day (BID) | RESPIRATORY_TRACT | Status: DC
Start: 2015-10-29 — End: 2015-11-03
  Administered 2015-10-29 – 2015-11-02 (×8): 2 via RESPIRATORY_TRACT
  Filled 2015-10-29: qty 6.9

## 2015-10-29 MED ORDER — CARVEDILOL 3.125 MG PO TABS
3.1250 mg | ORAL_TABLET | Freq: Two times a day (BID) | ORAL | Status: DC
  Administered 2015-10-30: 3.125 mg via ORAL
  Filled 2015-10-29: qty 1

## 2015-10-29 MED ORDER — IPRATROPIUM BROMIDE 0.02 % IN SOLN
0.5000 mg | Freq: Four times a day (QID) | RESPIRATORY_TRACT | Status: DC | PRN
  Administered 2015-10-30: 0.5 mg via RESPIRATORY_TRACT
  Filled 2015-10-29 (×3): qty 2.5

## 2015-10-29 MED ORDER — PANTOPRAZOLE SODIUM 40 MG PO TBEC
40.0000 mg | DELAYED_RELEASE_TABLET | Freq: Every day | ORAL | Status: DC
Start: 2015-10-29 — End: 2015-11-03
  Administered 2015-10-29 – 2015-11-03 (×5): 40 mg via ORAL
  Filled 2015-10-29 (×6): qty 1

## 2015-10-29 MED ORDER — SODIUM CHLORIDE 0.9 % IJ SOLN
3.0000 mL | Freq: Two times a day (BID) | INTRAMUSCULAR | Status: DC
  Administered 2015-10-29 – 2015-11-03 (×9): 3 mL via INTRAVENOUS

## 2015-10-29 MED ORDER — FUROSEMIDE 40 MG PO TABS: 60.0000 mg | ORAL_TABLET | Freq: Two times a day (BID) | ORAL | Status: DC

## 2015-10-29 MED ORDER — ALBUTEROL SULFATE (2.5 MG/3ML) 0.083% IN NEBU
3.0000 mL | INHALATION_SOLUTION | Freq: Four times a day (QID) | RESPIRATORY_TRACT | Status: DC | PRN
  Administered 2015-10-30 – 2015-10-31 (×3): 3 mL via RESPIRATORY_TRACT
  Filled 2015-10-29 (×5): qty 3

## 2015-10-29 MED ORDER — ACETAMINOPHEN 650 MG RE SUPP: 650.0000 mg | Freq: Four times a day (QID) | RECTAL | Status: DC | PRN

## 2015-10-29 MED ORDER — GABAPENTIN 300 MG PO CAPS
600.0000 mg | ORAL_CAPSULE | Freq: Two times a day (BID) | ORAL | Status: DC
  Administered 2015-10-29 – 2015-11-03 (×8): 600 mg via ORAL
  Filled 2015-10-29 (×10): qty 2

## 2015-10-29 MED ORDER — ACETAMINOPHEN 325 MG PO TABS
650.0000 mg | ORAL_TABLET | Freq: Four times a day (QID) | ORAL | Status: DC | PRN
  Administered 2015-10-29 – 2015-11-02 (×2): 650 mg via ORAL
  Filled 2015-10-29 (×2): qty 2

## 2015-10-29 MED ORDER — SODIUM CHLORIDE 0.9 % IV SOLN: 250.0000 mL | INTRAVENOUS | Status: DC | PRN

## 2015-10-29 MED ORDER — FUROSEMIDE 10 MG/ML IJ SOLN
60.0000 mg | Freq: Two times a day (BID) | INTRAMUSCULAR | Status: DC
  Administered 2015-10-29 – 2015-10-31 (×4): 60 mg via INTRAVENOUS
  Filled 2015-10-29 (×4): qty 6

## 2015-10-29 MED ORDER — FUROSEMIDE 10 MG/ML IJ SOLN
40.0000 mg | Freq: Once | INTRAMUSCULAR | Status: AC
  Administered 2015-10-29: 40 mg via INTRAVENOUS
  Filled 2015-10-29: qty 4

## 2015-10-29 MED ORDER — HEPARIN SODIUM (PORCINE) 5000 UNIT/ML IJ SOLN
5000.0000 [IU] | Freq: Three times a day (TID) | INTRAMUSCULAR | Status: DC
  Administered 2015-10-30: 5000 [IU] via SUBCUTANEOUS
  Filled 2015-10-29 (×5): qty 1

## 2015-10-29 MED ORDER — INSULIN ASPART 100 UNIT/ML ~~LOC~~ SOLN
0.0000 [IU] | Freq: Three times a day (TID) | SUBCUTANEOUS | Status: DC
  Administered 2015-10-30: 5 [IU] via SUBCUTANEOUS
  Administered 2015-10-30: 3 [IU] via SUBCUTANEOUS
  Administered 2015-10-30: 5 [IU] via SUBCUTANEOUS
  Administered 2015-10-31: 1 [IU] via SUBCUTANEOUS
  Administered 2015-10-31: 9 [IU] via SUBCUTANEOUS
  Administered 2015-10-31: 5 [IU] via SUBCUTANEOUS
  Administered 2015-11-01 (×3): 2 [IU] via SUBCUTANEOUS
  Administered 2015-11-02: 3 [IU] via SUBCUTANEOUS
  Administered 2015-11-02: 5 [IU] via SUBCUTANEOUS
  Administered 2015-11-02: 3 [IU] via SUBCUTANEOUS
  Administered 2015-11-03: 5 [IU] via SUBCUTANEOUS

## 2015-10-29 MED ORDER — FLUTICASONE FUROATE-VILANTEROL 100-25 MCG/INH IN AEPB: 1.0000 | INHALATION_SPRAY | Freq: Every day | RESPIRATORY_TRACT | Status: DC

## 2015-10-29 NOTE — H&P (Signed)
Family Medicine Teaching [facility] Admission History and Physical Service Pager: [phone]  Patient name: Russell Hamilton Medical record number: [id number] Date of birth: [birth year] Age: 74 y.o. Gender: male  Primary Care Provider: Ambrose Finland, NP Consultants: None Code Status: Full (discussed on admission)  Chief Complaint: dark urine  Assessment and Plan: Russell Hamilton is a 74 y.o. male presenting with SOB. 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   Dyspnea, Acute Decompensated Heart Failure: Elevation in JYN(8295) as well as evidence of interstitial edema on chest x-ray. On hospice for HF. HFrEF Echo 07/2015: EF 10-15%, diffuse hypokinesis, mild aortic stenosis.Long history of noncompliance for HF medications. Heart failure team unable to treat patient any further; not a candidate for inotropic therapy. Second admission this month. On exam patient with increased O2 requirement also with peripheral edema. Weight is up from discharge weight. Lungs sounds clear.  - Admit to tele under Dr. Randolm Idol - s/p IV lasix  in ED, continue IV Lasix 60 BID -> holding home dose of PO Lasix 60 mg BID - daily weights (165 on admit, discharged at about 157lbs; baseline unknown but thinking 150s) - strict I/Os - Cont Coreg 3.125 - restart lisinopril as BPs allow - Continue to monitor blood pressure and electrolytes with diuresis - Continue Trinity O2; wean as tolerated. Home O2 requirement 3L; keep sats >90% - Continue home: Symbicort, albuterol and atrovent prn  Dysuria: Patient reporting symptoms of dysuria. Also came in for concern for dark brown "tea-colored" urine. UA in ED not concerning for infection. CK level was low at 28.  -monitor -consider urine culture  Bilateral Leg pain: Possibly due to neuropathy secondary to diabetes vs claudication form PAD(Current smoker; Weak pulses in feet). B12 elevated. ABIs on last admission were  abnormal:  0.77 on R, 0.66 on L. - Continue home gabapentin  BID; titrate up as needed - Discontinue Lyrica  CKD Stage 3b: (Cr baseline 1.4). Cr on admission 1.03. - started on ACEi at last admission but has been noncompliant - holding ACEi in setting of soft BPs - Monitor creatinine with diuresis  DM2: A1c 11.2 (10/18/15) - Holding Amaryl and Januvia; patient reports not taking - SSI sensative  Anemia (Macrocytic): Hgb 8.8 (new baseline ~9, baseline 13, 06/2015) , MCV 102.6, RDW elevated. Patient denying any blood loss. Work-up at last admission with iron panel, ferritin, folate normal; B12 elevated. Asymptomatic - continue to monitor - Transfusion threshold 7  Hypocalcemia: Ca 8.5.Corrected calcium 9.4. Asymptomatic.  FEN/GI: Heart Health; SLIV Prophylaxis: Subq Hep  Disposition: Admit for observation.  History of Present Illness:  Russell Hamilton is a 74 y.o. male presenting with SOB and concern for brown urine. Patient states that he woke up this morning and saw that he had darkk brown urine. He had never noticed this before. He endorses associated dysuria. Also states he came in for shortness of breath that has worsened over the last 3 days. He wears 3L O2 at baseline. He is having increased phlegm. Phlegm is thick and yellow. States he has been drinking adequately. Endorses only taking Lasix medication.   Review Of Systems: Per HPI with the following additions: denies CP, fevers, abdominal pain, nausea, vomiting. Otherwise the remainder of the systems were negative.  Patient Active Problem List   Diagnosis Date Noted  . SOB (shortness of breath)   . Left elbow pain   . Dementia   . Acute HF (heart failure) (HCC)  10/17/2015  . Palliative care encounter   . Observed seizure-like activity (HCC)   . 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 Personal history of noncompliance with medical treatment 09/21/2015  . Suicidal ideation   . CHF exacerbation  (HCC) 09/20/2015  . Acute on chronic congestive heart failure (HCC)   . Chronic bronchitis (HCC)   . Secondary hypertension, unspecified   . Uncontrolled type 2 diabetes mellitus with ketoacidosis without coma, without long-term current use of insulin (HCC)   . Acute renal insufficiency 09/16/2015  . Renal insufficiency   . Sprain of left knee   . Fall at home   . Hypoxia   . Normochromic normocytic anemia   . Prolonged QT interval   . COPD exacerbation (HCC) 09/14/2015  . Chronic pain syndrome 09/08/2015  . Neutropenia (HCC) 07/13/2015  . CHF (congestive heart failure) (HCC) 07/13/2015  . Acute on chronic combined systolic and diastolic congestive heart failure (HCC) 06/12/2015  . Orthostatic hypotension 05/09/2015  . Tinea pedis 04/26/2015  . Benign paroxysmal positional vertigo 04/12/2015  . GERD (gastroesophageal reflux disease) 04/05/2015  . COPD (chronic obstructive pulmonary disease) (HCC) 03/26/2015  . COPD with acute exacerbation (HCC) 03/26/2015  . Chronic kidney disease, stage 3 03/01/2015  . Type 2 diabetes mellitus with diabetic chronic kidney disease (HCC)   . Unstable angina (HCC) 02/25/2015  . Congestive heart failure (HCC) 02/24/2015  . Cough, persistent 02/08/2015  . Tobacco abuse 02/08/2015  . Acute diastolic CHF (congestive heart failure) (HCC) 12/12/2014  . Acute systolic CHF (congestive heart failure) (HCC) 12/12/2014  . Acute CHF (HCC) 12/11/2014  . AKI (acute kidney injury) (HCC) 11/22/2014  . Hypokalemia 11/21/2014  . Elevated troponin 11/20/2014  . Acute on chronic combined systolic and diastolic heart failure (HCC) 11/20/2014  . Foot ulcer (HCC) 11/20/2014  . NSTEMI (non-ST elevated myocardial infarction) (HCC)   . Pleural effusion   . Acute respiratory failure with hypoxia (HCC)   . Protein-calorie malnutrition, severe (HCC) 08/13/2014  . Chronic systolic CHF (congestive heart failure) (HCC)   . Type II diabetes mellitus with renal manifestations,  uncontrolled (HCC)   . Hypertension   . Stroke (HCC)   . Acute systolic CHF (congestive heart failure), NYHA class 4 (HCC) 08/12/2014  . History of stroke 08/12/2014  . HTN (hypertension) 08/12/2014  . Peripheral neuropathy (HCC) 08/12/2014    Past Medical History: Past Medical History  Diagnosis Date  . Hypertension   . Chronic systolic CHF (congestive heart failure) (HCC)     a. 2D ECHO 08/13/14: EF 40%, diffuse hypokinesis possibly worse in the inferior wall. Mild MR, mild LA dilation, PA pressure 50. Trivial pericardial effusion.  . Acute systolic CHF (congestive heart failure), NYHA class 4 (HCC) 08/12/2014  . Type II diabetes mellitus (HCC)   . GERD (gastroesophageal reflux disease)   . Peripheral neuropathy (HCC)     [name]/notes 08/12/2014  . Stroke [facility](HCC) 2010    "memory problems since; right leg and left arm weakness since" (02/08/2015)  . Ulcers of both lower extremities (HCC)     [name]/notes 02/24/2015  . Noncompliance     [name]/notes 02/24/2015  . COPD (chronic obstructive pulmonary disease) (HCC)   . Asthma   . Renal insufficiency     Past Surgical History: Past Surgical History  Procedure Laterality Date  . Carotid endarterectomy Right 2010  . Hand ligament reconstruction Right ?1970's    "pinky"  . Foot surgery Left     debridement  2016    Social History: Social History  Substance Use Topics  . Smoking status: Current Every Day Smoker -- 0.50 packs/day for 56 years    Types: Cigarettes  . Smokeless tobacco: Never Used  . Alcohol Use: No     Comment: 02/08/2015 "I might have a beer q 6 months"   Additional social history: Lives alone Please also refer to relevant sections of EMR.  Family History: Family History  Problem Relation Age of Onset  . Kidney disease Mother   . Heart attack Brother     X 2  . Cancer Brother    Allergies and Medications: No Known Allergies No current facility-administered medications on file prior to encounter.   Current Outpatient  Prescriptions on File Prior to Encounter  Medication Sig Dispense Refill  . albuterol (PROVENTIL HFA;VENTOLIN HFA) 108 (90 BASE) MCG/ACT inhaler Inhale 2 puffs into the lungs every 6 (six) hours as needed for wheezing or shortness of breath. 1 Inhaler 1  . carvedilol (COREG) 3.125 MG tablet Take 1 tablet (3.125 mg total) by mouth 2 (two) times daily with a meal. 60 tablet 1  . colchicine 0.6 MG tablet Take 1 tablet (0.6 mg total) by mouth daily. 30 tablet 1  . Fluticasone Furoate-Vilanterol (BREO ELLIPTA) 100-25 MCG/INH AEPB Inhale 1 puff into the lungs daily. 1 each 2  . furosemide (LASIX) 40 MG tablet Take 1.5 tablets (60 mg total) by mouth 2 (two) times daily. 90 tablet 1  . gabapentin (NEURONTIN) 300 MG capsule Take 600 mg by mouth 2 (two) times daily.    Marland Kitchen glimepiride (AMARYL) 2 MG tablet Take 1 tablet (2 mg total) by mouth daily with breakfast. (Patient not taking: Reported on 09/14/2015) 30 tablet 2  . ipratropium (ATROVENT) 0.02 % nebulizer solution Take 2.5 mLs (0.5 mg total) by nebulization every 6 (six) hours as needed for wheezing or shortness of breath. 75 mL 6  . levofloxacin (LEVAQUIN) 500 MG tablet Take 1 tablet (500 mg total) by mouth daily. 3 tablet 0  . lisinopril (PRINIVIL,ZESTRIL) 2.5 MG tablet Take 1 tablet (2.5 mg total) by mouth daily. 30 tablet 1  . omeprazole (PRILOSEC) 20 MG capsule Take 1 capsule (20 mg total) by mouth daily. 30 capsule 2  . oxyCODONE-acetaminophen (ROXICET) 5-325 MG tablet Take 1 tablet by mouth every 12 (twelve) hours as needed for severe pain. 60 tablet 0  . predniSONE (DELTASONE) 20 MG tablet Take 2 tablets (40 mg total) by mouth daily with breakfast. 2 tablet 0  . pregabalin (LYRICA) 100 MG capsule Take 1 capsule (100 mg total) by mouth 2 (two) times daily. 60 capsule 1  . silver sulfADIAZINE (SILVADENE) 1 % cream Apply 1 application topically daily. 50 g 0  . sitaGLIPtin (JANUVIA) 50 MG tablet Take 1 tablet (50 mg total) by mouth daily. For diabetes  (Patient not taking: Reported on 09/14/2015) 30 tablet 2    Objective: BP 137/89 mmHg  Pulse 100  Temp(Src) 97.7 F (36.5 C) (Oral)  Resp 27  SpO2 100% Exam: General: NAD; thin and frail, lysing in bed Eyes: PERRLA; EOMI ENTM: MMM Neck: Supple; No JVD, full ROM Cardiovascular: RRR, no m/r/g Respiratory: Diffusely decreased breath sounds; CTAB. Normal respiratory effort on 5L [place]. No rhonchi, crackles, or wheezes. Abdomen: SNTND, BS + MSK: 1+ pitting LE edema bilaterally, legs tender to palpation, no erythema Skin: warm and dry, intact Neuro: A&Ox4; non-focal; gross motor and sensory intact Psych: Normal mood and affect  Labs and Imaging: Results  for orders placed or performed during the hospital encounter of 10/29/15 (from the past 24 hour(s))  CBC with Differential/Platelet     Status: Abnormal   Collection Time: 10/29/15  1:39 PM  Result Value Ref Range   WBC 9.9 4.0 - 10.5 K/uL   RBC 2.74 (L) 4.22 - 5.81 MIL/uL   Hemoglobin 8.8 (L) 13.0 - 17.0 g/dL   HCT 93.7 (L) 16.9 - 67.8 %   MCV 102.6 (H) 78.0 - 100.0 fL   MCH 32.1 26.0 - 34.0 pg   MCHC 31.3 30.0 - 36.0 g/dL   RDW 93.8 (H) 10.1 - 75.1 %   Platelets 184 150 - 400 K/uL   Neutrophils Relative % 23 %   Lymphocytes Relative 12 %   Monocytes Relative 58 %   Eosinophils Relative 0 %   Basophils Relative 1 %   Band Neutrophils 4 %   Metamyelocytes Relative 2 %   Myelocytes 0 %   Promyelocytes Absolute 0 %   Blasts 0 %   nRBC 3 (H) 0 /100 WBC   Other 0 %   Neutro Abs 2.9 1.7 - 7.7 K/uL   Lymphs Abs 1.2 0.7 - 4.0 K/uL   Monocytes Absolute 5.7 (H) 0.1 - 1.0 K/uL   Eosinophils Absolute 0.0 0.0 - 0.7 K/uL   Basophils Absolute 0.1 0.0 - 0.1 K/uL   RBC Morphology POLYCHROMASIA PRESENT   Comprehensive metabolic panel     Status: Abnormal   Collection Time: 10/29/15  1:39 PM  Result Value Ref Range   Sodium 136 135 - 145 mmol/L   Potassium 4.0 3.5 - 5.1 mmol/L   Chloride 97 (L) 101 - 111 mmol/L   CO2 30 22 - 32  mmol/L   Glucose, Bld 216 (H) 65 - 99 mg/dL   BUN 14 6 - 20 mg/dL   Creatinine, Ser 0.25 0.61 - 1.24 mg/dL   Calcium 8.5 (L) 8.9 - 10.3 mg/dL   Total Protein 7.2 6.5 - 8.1 g/dL   Albumin 2.9 (L) 3.5 - 5.0 g/dL   AST 18 15 - 41 U/L   ALT 15 (L) 17 - 63 U/L   Alkaline Phosphatase 159 (H) 38 - 126 U/L   Total Bilirubin 2.4 (H) 0.3 - 1.2 mg/dL   GFR calc non Af Amer >60 >60 mL/min   GFR calc Af Amer >60 >60 mL/min   Anion gap 9 5 - 15  Brain natriuretic peptide     Status: Abnormal   Collection Time: 10/29/15  1:39 PM  Result Value Ref Range   B Natriuretic Peptide 1627.6 (H) 0.0 - 100.0 pg/mL  CK     Status: Abnormal   Collection Time: 10/29/15  1:39 PM  Result Value Ref Range   Total CK 28 (L) 49 - 397 U/L  I-stat troponin, ED     Status: None   Collection Time: 10/29/15  1:48 PM  Result Value Ref Range   Troponin i, poc 0.01 0.00 - 0.08 ng/mL   Comment 3          Urinalysis, Routine w reflex microscopic (not at [facility])     Status: Abnormal   Collection Time: 10/29/15  4:45 PM  Result Value Ref Range   Color, Urine AMBER (A) YELLOW   APPearance CLOUDY (A) CLEAR   Specific Gravity, Urine 1.013 1.005 - 1.030   pH 5.5 5.0 - 8.0   Glucose, UA NEGATIVE NEGATIVE mg/dL   Hgb urine dipstick NEGATIVE NEGATIVE   Bilirubin  Urine SMALL (A) NEGATIVE   Ketones, ur NEGATIVE NEGATIVE mg/dL   Protein, ur NEGATIVE NEGATIVE mg/dL   Nitrite NEGATIVE NEGATIVE   Leukocytes, UA MODERATE (A) NEGATIVE  Urine microscopic-add on     Status: Abnormal   Collection Time: 10/29/15  4:45 PM  Result Value Ref Range   Squamous Epithelial / LPF 0-5 (A) NONE SEEN   WBC, UA 6-30 0 - 5 WBC/hpf   RBC / HPF 0-5 0 - 5 RBC/hpf   Bacteria, UA MANY (A) NONE SEEN   Dg Chest Port 1 View  10/29/2015  CLINICAL DATA:  Dec O2 sats + dark urine since this am EXAM: PORTABLE CHEST - 1 VIEW COMPARISON:  10/17/2015 FINDINGS: Central pulmonary vascular congestion and moderate interstitial edema, slightly improved since  previous exam. Left pleural effusion appears slightly improved. Right lateral costophrenic angle is excluded. Mild cardiomegaly. No pneumothorax. Degenerative changes of bilateral acromioclavicular joints incidentally noted. IMPRESSION: 1. Bilateral interstitial edema and pulmonary vascular congestion, slightly improved. 2. Persistent left pleural effusion and cardiomegaly. Electronically Signed   By: Corlis Leak M.D.   On: 10/29/2015 14:13    Pincus Large, DO 10/29/2015, 5:42 PM PGY-2, [facility] FPTS Intern pager: [phone], text pages welcome

## 2015-10-29 NOTE — ED Notes (Signed)
Pt aware of urine sample, state he went before coming to ED. Gave pt a urinal

## 2015-10-29 NOTE — ED Provider Notes (Signed)
CSN: [id number]     Arrival date & time 10/29/15  1256 History   First MD Initiated Contact with Patient 10/29/15 1301     Chief Complaint  Patient presents with  . dark urine      (Consider location/radiation/quality/duration/timing/severity/associated sxs/prior Treatment) HPI Patient has a history of congestive heart failure and COPD on 3 L of home oxygen. States he's been getting increasingly short of breath over the last 3 days. Admits to cough productive of yellow sputum. Denies any fevers or chills. Increased lower extremity swelling bilaterally. He states this been going on for the past few months. Also states over the last 3 days he's had dark urine with dysuria. Denies flank pain. Denies abdominal pain, nausea, vomiting. Past Medical History  Diagnosis Date  . Hypertension   . Chronic systolic CHF (congestive heart failure) (HCC)     a. 2D ECHO 08/13/14: EF 40%, diffuse hypokinesis possibly worse in the inferior wall. Mild MR, mild LA dilation, PA pressure 50. Trivial pericardial effusion.  . Acute systolic CHF (congestive heart failure), NYHA class 4 (HCC) 08/12/2014  . Type II diabetes mellitus (HCC)   . GERD (gastroesophageal reflux disease)   . Peripheral neuropathy (HCC)     [name]/notes 08/12/2014  . Stroke [facility](HCC) 2010    "memory problems since; right leg and left arm weakness since" (02/08/2015)  . Ulcers of both lower extremities (HCC)     [name]/notes 02/24/2015  . Noncompliance     [name]/notes 02/24/2015  . COPD (chronic obstructive pulmonary disease) (HCC)   . Asthma   . Renal insufficiency    Past Surgical History  Procedure Laterality Date  . Carotid endarterectomy Right 2010  . Hand ligament reconstruction Right ?1970's    "pinky"  . Foot surgery Left     debridement    2016   Family History  Problem Relation Age of Onset  . Kidney disease Mother   . Heart attack Brother     X 2  . Cancer Brother    Social History  Substance Use Topics  . Smoking status: Current Every  Day Smoker -- 0.50 packs/day for 56 years    Types: Cigarettes  . Smokeless tobacco: Never Used  . Alcohol Use: No     Comment: 02/08/2015 "I might have a beer q 6 months"    Review of Systems  Constitutional: Negative for fever and chills.  HENT: Negative for congestion and sore throat.   Respiratory: Positive for cough and shortness of breath. Negative for wheezing.   Cardiovascular: Positive for leg swelling. Negative for chest pain and palpitations.  Gastrointestinal: Negative for nausea, vomiting, abdominal pain, diarrhea and constipation.  Genitourinary: Positive for dysuria. Negative for flank pain, difficulty urinating and penile pain.  Musculoskeletal: Negative for back pain, neck pain and neck stiffness.  Skin: Negative for rash and wound.  Neurological: Negative for dizziness, weakness, light-headedness, numbness and headaches.  All other systems reviewed and are negative.     Allergies  Review of patient's allergies indicates no known allergies.  Home Medications   Prior to Admission medications   Medication Sig Start Date End Date Taking? Authorizing Provider  albuterol (PROVENTIL HFA;VENTOLIN HFA) 108 (90 BASE) MCG/ACT inhaler Inhale 2 puffs into the lungs every 6 (six) hours as needed for wheezing or shortness of breath. 08/17/15   Jaclyn ShaggyEnobong Amao, MD  carvedilol (COREG) 3.125 MG tablet Take 1 tablet (3.125 mg total) by mouth 2 (two) times daily with a meal. 10/21/15   Aundria RudAbigail Joseph  Natale Milch, MD  colchicine 0.6 MG tablet Take 1 tablet (0.6 mg total) by mouth daily. 10/21/15   Marquette Saa, MD  Fluticasone Furoate-Vilanterol (BREO ELLIPTA) 100-25 MCG/INH AEPB Inhale 1 puff into the lungs daily. 09/07/15   Jaclyn Shaggy, MD  furosemide (LASIX) 40 MG tablet Take 1.5 tablets (60 mg total) by mouth 2 (two) times daily. 09/07/15   Jaclyn Shaggy, MD  gabapentin (NEURONTIN) 300 MG capsule Take 600 mg by mouth 2 (two) times daily.    Historical Provider, MD  glimepiride  (AMARYL) 2 MG tablet Take 1 tablet (2 mg total) by mouth daily with breakfast. Patient not taking: Reported on 09/14/2015 07/04/15   Jaclyn Shaggy, MD  ipratropium (ATROVENT) 0.02 % nebulizer solution Take 2.5 mLs (0.5 mg total) by nebulization every 6 (six) hours as needed for wheezing or shortness of breath. 10/04/15   Jaclyn Shaggy, MD  levofloxacin (LEVAQUIN) 500 MG tablet Take 1 tablet (500 mg total) by mouth daily. 10/21/15   Marquette Saa, MD  lisinopril (PRINIVIL,ZESTRIL) 2.5 MG tablet Take 1 tablet (2.5 mg total) by mouth daily. 10/21/15   Marquette Saa, MD  omeprazole (PRILOSEC) 20 MG capsule Take 1 capsule (20 mg total) by mouth daily. 09/12/15   Jaclyn Shaggy, MD  oxyCODONE-acetaminophen (ROXICET) 5-325 MG tablet Take 1 tablet by mouth every 12 (twelve) hours as needed for severe pain. 09/07/15   Jaclyn Shaggy, MD  predniSONE (DELTASONE) 20 MG tablet Take 2 tablets (40 mg total) by mouth daily with breakfast. 10/21/15   Marquette Saa, MD  pregabalin (LYRICA) 100 MG capsule Take 1 capsule (100 mg total) by mouth 2 (two) times daily. 09/07/15   Jaclyn Shaggy, MD  silver sulfADIAZINE (SILVADENE) 1 % cream Apply 1 application topically daily. 06/20/15   Vivi Barrack, DPM  sitaGLIPtin (JANUVIA) 50 MG tablet Take 1 tablet (50 mg total) by mouth daily. For diabetes Patient not taking: Reported on 09/14/2015 07/04/15   Jaclyn Shaggy, MD   BP 114/83 mmHg  Pulse 101  Resp 20  SpO2 96% Physical Exam  Constitutional: He is oriented to person, place, and time. He appears well-developed and well-nourished. No distress.  HENT:  Head: Normocephalic and atraumatic.  Mouth/Throat: Oropharynx is clear and moist.  Eyes: EOM are normal. Pupils are equal, round, and reactive to light.  Neck: Normal range of motion. Neck supple.  Cardiovascular: Normal rate and regular rhythm.  Exam reveals no gallop and no friction rub.   No murmur heard. Pulmonary/Chest: Effort normal. No  respiratory distress. He has no wheezes. He has rales.  Left-sided rales  Abdominal: Soft. Bowel sounds are normal. He exhibits no distension and no mass. There is no tenderness. There is no rebound and no guarding.  Musculoskeletal: Normal range of motion. He exhibits no edema or tenderness.  No CVA tenderness. 2+ bilateral pitting edema. No calf swelling or tenderness.  Neurological: He is alert and oriented to person, place, and time.  Moving all extremities without deficit. Sensation is fully intact.  Skin: Skin is warm and dry. No rash noted. No erythema.  Psychiatric: He has a normal mood and affect. His behavior is normal.  Nursing note and vitals reviewed.   ED Course  Procedures (including critical care time) Labs Review Labs Reviewed  COMPREHENSIVE METABOLIC PANEL - Abnormal; Notable for the following:    Chloride 97 (*)    Glucose, Bld 216 (*)    Calcium 8.5 (*)    Albumin 2.9 (*)  ALT 15 (*)    Alkaline Phosphatase 159 (*)    Total Bilirubin 2.4 (*)    All other components within normal limits  BRAIN NATRIURETIC PEPTIDE - Abnormal; Notable for the following:    B Natriuretic Peptide 1627.6 (*)    All other components within normal limits  CK - Abnormal; Notable for the following:    Total CK 28 (*)    All other components within normal limits  CBC WITH DIFFERENTIAL/PLATELET  URINALYSIS, ROUTINE W REFLEX MICROSCOPIC (NOT AT [facility])  Rosezena Sensor, ED    Imaging Review Dg Chest Port 1 View  10/29/2015  CLINICAL DATA:  Dec O2 sats + dark urine since this am EXAM: PORTABLE CHEST - 1 VIEW COMPARISON:  10/17/2015 FINDINGS: Central pulmonary vascular congestion and moderate interstitial edema, slightly improved since previous exam. Left pleural effusion appears slightly improved. Right lateral costophrenic angle is excluded. Mild cardiomegaly. No pneumothorax. Degenerative changes of bilateral acromioclavicular joints incidentally noted. IMPRESSION: 1. Bilateral  interstitial edema and pulmonary vascular congestion, slightly improved. 2. Persistent left pleural effusion and cardiomegaly. Electronically Signed   By: Corlis Leak M.D.   On: 10/29/2015 14:13   I have personally reviewed and evaluated these images and lab results as part of my medical decision-making.   EKG Interpretation   Date/Time:  Saturday October 29 2015 13:51:11 EST Ventricular Rate:  96 PR Interval:  164 QRS Duration: 109 QT Interval:  370 QTC Calculation: 468 R Axis:   -11 Text Interpretation:  Sinus rhythm Ventricular premature complex Probable  left atrial enlargement Nonspecific repol abnormality, lateral leads  Confirmed by Ranae Palms  MD, Zai Chmiel ([id number]) on 10/29/2015 3:23:34 PM      MDM   Final diagnoses:  Acute on chronic congestive heart failure, unspecified congestive heart failure type [facility])    Patient given IV Lasix. Elevation in BNP as well as evidence of interstitial edema on chest x-ray. Discussed with family medicine resident Dr. Doroteo Glassman. Will accept the patient in transfer for Dr.Fletke to telemetry bed. Patient is in no distress and maintaining oxygen saturations on 3 L.    Loren Racer, MD 10/29/15 1524

## 2015-10-29 NOTE — Progress Notes (Signed)
Chaplain visit the result of a staff consult.  Russell Hamilton has been told he is being placed on Hospice Care. He says he does not know what that means or why it applies to him. He says he has not been contacted by the Palliative team. Recommend a Palliative consult to explain what Hospice Care entails and why one is placed on Hospice Care.  He asked to see a nurse because his breathing was getting harder. The visit ended on this note/  Russell KarvonenCharles D. Mycah Hamilton, DMin Chaplain

## 2015-10-29 NOTE — ED Notes (Signed)
Pt aware of needed urine specimen.

## 2015-10-29 NOTE — ED Notes (Signed)
[facility] called for transport due to [facility] with an estimated arrival time of 2 hours or more.

## 2015-10-29 NOTE — Progress Notes (Signed)
Family Medicine notified patient needs admission orders.

## 2015-10-29 NOTE — ED Notes (Signed)
[facility] [place]

## 2015-10-29 NOTE — ED Notes (Signed)
Per EMS- Patient is a Hospice patient for CHF/COPD. Patient noted that he had had brown-colored urine and became anxious. Patient then called his Hospice nurse who in turn called EMS. Patient is normally on home O2 at 3L/min via [place]. O2 sats low 90's. EMS increased O2 to 6L/min via [place] and sats increased to 98%.

## 2015-10-30 ENCOUNTER — Observation Stay (HOSPITAL_COMMUNITY)

## 2015-10-30 DIAGNOSIS — N183 Chronic kidney disease, stage 3 (moderate): Secondary | ICD-10-CM | POA: Diagnosis present

## 2015-10-30 DIAGNOSIS — R0902 Hypoxemia: Secondary | ICD-10-CM | POA: Diagnosis not present

## 2015-10-30 DIAGNOSIS — J449 Chronic obstructive pulmonary disease, unspecified: Secondary | ICD-10-CM | POA: Diagnosis present

## 2015-10-30 DIAGNOSIS — Z8673 Personal history of transient ischemic attack (TIA), and cerebral infarction without residual deficits: Secondary | ICD-10-CM | POA: Diagnosis not present

## 2015-10-30 DIAGNOSIS — I252 Old myocardial infarction: Secondary | ICD-10-CM | POA: Diagnosis not present

## 2015-10-30 DIAGNOSIS — F17210 Nicotine dependence, cigarettes, uncomplicated: Secondary | ICD-10-CM | POA: Diagnosis present

## 2015-10-30 DIAGNOSIS — Z7951 Long term (current) use of inhaled steroids: Secondary | ICD-10-CM | POA: Diagnosis not present

## 2015-10-30 DIAGNOSIS — R0600 Dyspnea, unspecified: Secondary | ICD-10-CM

## 2015-10-30 DIAGNOSIS — E1140 Type 2 diabetes mellitus with diabetic neuropathy, unspecified: Secondary | ICD-10-CM | POA: Diagnosis present

## 2015-10-30 DIAGNOSIS — E1122 Type 2 diabetes mellitus with diabetic chronic kidney disease: Secondary | ICD-10-CM | POA: Diagnosis present

## 2015-10-30 DIAGNOSIS — M109 Gout, unspecified: Secondary | ICD-10-CM | POA: Diagnosis not present

## 2015-10-30 DIAGNOSIS — Z7952 Long term (current) use of systemic steroids: Secondary | ICD-10-CM | POA: Diagnosis not present

## 2015-10-30 DIAGNOSIS — I509 Heart failure, unspecified: Secondary | ICD-10-CM | POA: Diagnosis not present

## 2015-10-30 DIAGNOSIS — I5023 Acute on chronic systolic (congestive) heart failure: Secondary | ICD-10-CM | POA: Diagnosis not present

## 2015-10-30 DIAGNOSIS — K219 Gastro-esophageal reflux disease without esophagitis: Secondary | ICD-10-CM | POA: Diagnosis present

## 2015-10-30 DIAGNOSIS — Z79899 Other long term (current) drug therapy: Secondary | ICD-10-CM | POA: Diagnosis not present

## 2015-10-30 DIAGNOSIS — Z66 Do not resuscitate: Secondary | ICD-10-CM | POA: Diagnosis present

## 2015-10-30 DIAGNOSIS — Z7984 Long term (current) use of oral hypoglycemic drugs: Secondary | ICD-10-CM | POA: Diagnosis not present

## 2015-10-30 DIAGNOSIS — Z515 Encounter for palliative care: Secondary | ICD-10-CM | POA: Diagnosis present

## 2015-10-30 DIAGNOSIS — D539 Nutritional anemia, unspecified: Secondary | ICD-10-CM | POA: Diagnosis present

## 2015-10-30 DIAGNOSIS — R300 Dysuria: Secondary | ICD-10-CM | POA: Diagnosis present

## 2015-10-30 DIAGNOSIS — R0602 Shortness of breath: Secondary | ICD-10-CM | POA: Diagnosis not present

## 2015-10-30 DIAGNOSIS — Z9119 Patient's noncompliance with other medical treatment and regimen: Secondary | ICD-10-CM | POA: Diagnosis not present

## 2015-10-30 DIAGNOSIS — F0390 Unspecified dementia without behavioral disturbance: Secondary | ICD-10-CM | POA: Diagnosis not present

## 2015-10-30 LAB — CBC
HEMATOCRIT: 25.4 % — AB (ref 39.0–52.0)
Hemoglobin: 8.1 g/dL — ABNORMAL LOW (ref 13.0–17.0)
MCH: 32.3 pg (ref 26.0–34.0)
MCHC: 31.9 g/dL (ref 30.0–36.0)
MCV: 101.2 fL — AB (ref 78.0–100.0)
PLATELETS: 186 10*3/uL (ref 150–400)
RBC: 2.51 MIL/uL — AB (ref 4.22–5.81)
RDW: 21.3 % — ABNORMAL HIGH (ref 11.5–15.5)
WBC: 12.3 10*3/uL — AB (ref 4.0–10.5)

## 2015-10-30 LAB — BASIC METABOLIC PANEL
Anion gap: 8 (ref 5–15)
BUN: 13 mg/dL (ref 6–20)
CHLORIDE: 95 mmol/L — AB (ref 101–111)
CO2: 30 mmol/L (ref 22–32)
CREATININE: 1.2 mg/dL (ref 0.61–1.24)
Calcium: 7.8 mg/dL — ABNORMAL LOW (ref 8.9–10.3)
GFR calc non Af Amer: 59 mL/min — ABNORMAL LOW (ref >60)
Glucose, Bld: 288 mg/dL — ABNORMAL HIGH (ref 65–99)
POTASSIUM: 3.8 mmol/L (ref 3.5–5.1)
Sodium: 133 mmol/L — ABNORMAL LOW (ref 135–145)

## 2015-10-30 LAB — GLUCOSE, CAPILLARY
GLUCOSE-CAPILLARY: 292 mg/dL — AB (ref 65–99)
GLUCOSE-CAPILLARY: 272 mg/dL — AB (ref 65–99)
GLUCOSE-CAPILLARY: 201 mg/dL — AB (ref 65–99)
GLUCOSE-CAPILLARY: 329 mg/dL — AB (ref 65–99)

## 2015-10-30 MED ORDER — MORPHINE SULFATE (CONCENTRATE) 10 MG/0.5ML PO SOLN
2.5000 mg | ORAL | Status: DC | PRN
  Administered 2015-10-31 – 2015-11-02 (×3): 2.6 mg via ORAL
  Filled 2015-10-30 (×3): qty 0.5

## 2015-10-30 MED ORDER — FUROSEMIDE 10 MG/ML IJ SOLN: 60.0000 mg | INTRAMUSCULAR | Status: DC

## 2015-10-30 NOTE — Progress Notes (Signed)
Family medicine called informed of patient de sat  to 80%  on 5 lit O2  N/C.  Resp Therapy in placed patient on  Venti mask, rapid response nurse called . Patient is a hospice patient outside hospital DNR status . Will continue to monitor patient.

## 2015-10-30 NOTE — Progress Notes (Signed)
DNR.  Related admission.Patient admitted to hospice services on 09/28/15 with a primary dx of CHF.  Patient found in the bed awake and verbal.  Reports some discomfort to bilateral feet related to edema. Assisted patient with lunch and O2 sats in the 70's.  Patient asking about going to rehab after his discharge from the hosipital.  Advised case management would need to follow up on this request.  Please contact [facility] [phone] with any questions or concerns.  Willette PaJulie Brown, RN [facility]

## 2015-10-30 NOTE — Progress Notes (Signed)
Family Medicine Teaching Service Daily Progress Note Intern Pager: [phone]  Patient name: Russell Hamilton Medical record number: [id number] Date of birth: 01/06/1941 Age: 74 y.o. Gender: male  Primary Care Provider: Ambrose FinlandValerie A Keck, NP Consultants: none Code Status: DNR  Pt Overview and Major Events to Date:  12/24: admitted with CHF exacerbation.  12/25: Worsening SOB.   Assessment and Plan: Russell Hamilton is a 74 y.o. male presenting with SOB. 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   Dyspnea, Acute Decompensated Heart Failure: Elevation in JYN([id number](1627) as well as evidence of interstitial edema on chest x-ray. On hospice for HF. HFrEF Echo 07/2015: EF 10-15%, diffuse hypokinesis, mild aortic stenosis.Long history of noncompliance for HF medications. Heart failure team unable to treat patient any further; not a candidate for inotropic therapy. Second admission this month. Discussed as a team and with the patient. He is on Home hospice, and is continuing to elect hospice care here. This is likely continued cardiac decompensation that continues to be refractory to treatment, and alternative treatment measures are not in keeping with the patient's wishes at this time. He is essentially palliative care, and is electing palliative care at this time. He does not desire further extensive workup. Symptomatic treatment, and continued diuresis will be the plan going forward.  - Giving lasix 60mg  BID IV.  - daily weights, mildly negative fluid status today.  - strict I/Os - Discontinued Coreg.  - holding lisinopril.  - Continue to monitor blood pressure and electrolytes with diuresis - Continue prn O2.  - morphine for air hunger.  - EKG this am without evidence of acute MI. No chest pain. Bigeminy.  - Pt. Refused ABG.  - Continue home: Symbicort, albuterol and atrovent prn  Dysuria: Patient reporting symptoms of dysuria. Also came in for  concern for dark brown "tea-colored" urine. Urine with moderate leuks and many bacteria. Does not meet Mcgeer criteria for tx of UTI.  - Pt. Complaining of frequency/ dysuria. he is on lasix - Continue to monitor.    Bilateral Leg pain: Possibly due to neuropathy secondary to diabetes vs claudication form PAD(Current smoker; Weak pulses in feet). B12 elevated. ABIs on last admission were abnormal: 0.77 on R, 0.66 on L. - Continue home gabapentin 600mg  BID; titrate up as needed - Discontinue Lyrica  CKD Stage 3b: (Cr baseline 1.4). Cr on admission 1.03. - started on ACEi at last admission but has been noncompliant - holding ACEi in setting of soft BPs - Monitor creatinine with diuresis.  - Will likely develop some degree of cardiorenal syndrome.   DM2: A1c 11.2 (10/18/15) - Holding Amaryl and Januvia; patient reports not taking - SSI sensative  Anemia (Macrocytic): Hgb 8.8 (new baseline ~9, baseline 13, 06/2015) , MCV 102.6, RDW elevated. Patient denying any blood loss. Work-up at last admission with iron panel, ferritin, folate normal; B12 elevated. Asymptomatic - continue to monitor - Transfusion threshold 7   Hypocalcemia: Ca 8.5.Corrected calcium 9.4. Asymptomatic.  FEN/GI: Heart Health; SLIV Prophylaxis: Subq Hep   Disposition: Possible transfer to stepdown.   Subjective:  Pt. Acutely SOB this am with desaturation to the 80's. He has required Venti mask 15 L to maintain saturation > 90%. He denies chest pain, nausea, or vomiting, arm pain, neck pain, or jaw pain. He has not had vision changes, dizziness, or light headedness. He did have a large BM in the bed this am. He continues to complain of leg pain  that he has had for some time, though no acute change in his leg pain. His leg pain is in the front of his legs as well as "shooting down the back of my legs". His O2 saturation responded to Venti mask. As I was returning to the room he was also self-medicating with Afrin. I told  him not to take this. He denied self-medication with any other meds. He did have skipped beats on exam. He is not feeling palpitations at this time.   Discussion with Dr. McDiarmid - attending and the patient resulted in pt. Electing DNR status and continued Hospice care here. No extensive workup. Symptomatic treatment / diuresis.   Objective: Temp:  [97.7 F (36.5 C)-98.1 F (36.7 C)] 97.7 F (36.5 C) (12/25 0623) Pulse Rate:  [91-101] 93 (12/25 0623) Resp:  [20-27] 20 (12/24 2100) BP: (96-137)/(53-89) 117/65 mmHg (12/25 0623) SpO2:  [72 %-100 %] 72 % (12/25 1100) FiO2 (%):  [55 %] 55 % (12/25 0858) Weight:  [165 lb 9.1 oz (75.1 kg)] 165 lb 9.1 oz (75.1 kg) (12/25 1610) Physical Exam: General: Resting in bed, with venturi mask in place. No distress. Complaining of pain in his legs intermittently.  Cardiovascular: Regular Rate, Intermittent skipped beats, no murmurs, no gallops. 2+ radial pulse.  Respiratory: Upper lung fields clear, left low lung field with distant breath sounds.   Abdomen: S, NT, ND, +BS Extremities: Edema to the mid abdomen. Nontender to palpation of calves, no asymmetric swelling.   Laboratory:  Recent Labs Lab 10/29/15 1339 10/29/15 1955 10/30/15 0254  WBC 9.9 10.7* 12.3*  HGB 8.8* 8.9* 8.1*  HCT 28.1* 28.2* 25.4*  PLT 184 203 186    Recent Labs Lab 10/29/15 1339 10/29/15 1955 10/30/15 0254  NA 136  --  133*  K 4.0  --  3.8  CL 97*  --  95*  CO2 30  --  30  BUN 14  --  13  CREATININE 1.03 1.19 1.20  CALCIUM 8.5*  --  7.8*  PROT 7.2  --   --   BILITOT 2.4*  --   --   ALKPHOS 159*  --   --   ALT 15*  --   --   AST 18  --   --   GLUCOSE 216*  --  288*     CXR: 12/25 IMPRESSION: 1. Progression of bilateral interstitial edema/infiltrates. 2. Persistent pleural effusions with the left lower lung consolidation/atelectasis.  Yolande Jolly, MD 10/30/2015, 11:03 AM PGY-2, [facility] FPTS Intern pager: [phone], text  pages welcome

## 2015-10-31 DIAGNOSIS — R0602 Shortness of breath: Secondary | ICD-10-CM | POA: Insufficient documentation

## 2015-10-31 DIAGNOSIS — F0390 Unspecified dementia without behavioral disturbance: Secondary | ICD-10-CM

## 2015-10-31 LAB — GLUCOSE, CAPILLARY
GLUCOSE-CAPILLARY: 366 mg/dL — AB (ref 65–99)
GLUCOSE-CAPILLARY: 286 mg/dL — AB (ref 65–99)
Glucose-Capillary: 131 mg/dL — ABNORMAL HIGH (ref 65–99)
Glucose-Capillary: 230 mg/dL — ABNORMAL HIGH (ref 65–99)

## 2015-10-31 LAB — BASIC METABOLIC PANEL
Anion gap: 9 (ref 5–15)
BUN: 15 mg/dL (ref 6–20)
CALCIUM: 7.9 mg/dL — AB (ref 8.9–10.3)
CHLORIDE: 95 mmol/L — AB (ref 101–111)
CO2: 30 mmol/L (ref 22–32)
CREATININE: 1.22 mg/dL (ref 0.61–1.24)
GFR calc non Af Amer: 57 mL/min — ABNORMAL LOW (ref >60)
Glucose, Bld: 311 mg/dL — ABNORMAL HIGH (ref 65–99)
Potassium: 3.7 mmol/L (ref 3.5–5.1)
Sodium: 134 mmol/L — ABNORMAL LOW (ref 135–145)

## 2015-10-31 LAB — URINE CULTURE: Special Requests: NORMAL

## 2015-10-31 MED ORDER — FUROSEMIDE 10 MG/ML IJ SOLN
60.0000 mg | Freq: Three times a day (TID) | INTRAMUSCULAR | Status: DC
  Administered 2015-10-31 – 2015-11-03 (×9): 60 mg via INTRAVENOUS
  Filled 2015-10-31 (×9): qty 6

## 2015-10-31 MED ORDER — MORPHINE SULFATE (PF) 2 MG/ML IV SOLN
1.0000 mg | INTRAVENOUS | Status: DC | PRN
  Administered 2015-10-31 – 2015-11-03 (×6): 1 mg via INTRAVENOUS
  Filled 2015-10-31 (×6): qty 1

## 2015-10-31 NOTE — Progress Notes (Signed)
Pt c/o SOB, O2 88%, sat pt up in bed and O2 came up to 92%, RT called for breathing tx, when RT got to room pt refused tx, pt again called staff for difficulty breathing O2 91% RT called for breathing tx, RN went into pt room with RT and pt received tx at 2146, pt also refused Neurontin, pt stated "I want to sleep, leave me alone" pt now resting comfortably in bed

## 2015-10-31 NOTE — Progress Notes (Signed)
[facility] rm 3 E 21 [facility] RN visit. GIP related admission to [facility] Dx: Systolic Heart Failure pt. Is a DNR code status. Patient seen in room dozing with Venturi  off of his face at 50%. Mask replaced over nose and mouth. Pt.asking for lunch and staff RN notified. Patient would not respond to questions. No family in the room. [facility] wil continue to follow daily. Please call with any hospice questions.  Sharen HeckLisa Strandberg RN [facility] Hospital Liaison [phone]

## 2015-10-31 NOTE — Progress Notes (Signed)
[facility] Chaplain Visit: Pt resting in bed with O2 mask in place, easily roused and open to chaplain visit, but very sob and coughing with any conversation. Pt indicated that he was aware that he "might not make it out this time" but that he was at peace spiritually, and welcomed prayer of comfort and blessing.  Pt then quickly into a coughing spasm, with difficulty clearing phlegm, and unit nurse Marge informed.  Chaplain to follow up with family. Lovenia ShuckJohn Connor, ThM, [facility] Chaplain

## 2015-10-31 NOTE — Progress Notes (Signed)
Family Medicine Teaching Service Daily Progress Note Intern Pager: [phone]  Patient name: Russell Hamilton Medical record number: [id number] Date of birth: 09/09/1941 Age: 74 y.o. Gender: male  Primary Care Provider: Ambrose FinlandValerie A Keck, NP Consultants: none Code Status: DNR  Pt Overview and Major Events to Date:  12/24: admitted with CHF exacerbation.  12/25: Worsening SOB.   Assessment and Plan: Russell Hamilton is a 74 y.o. male presenting with SOB. 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   Dyspnea, Acute Decompensated Heart Failure: Elevation in ZHY([id number](1627) as well as evidence of interstitial edema on chest x-ray. On hospice for HF. HFrEF Echo 07/2015: EF 10-15%, diffuse hypokinesis, mild aortic stenosis.Long history of noncompliance for HF medications. Not a candidate for inotropic therapy. Second admission this month. He is on Home hospice, and is continuing to elect hospice care here. This is likely continued cardiac decompensation that continues to be refractory to treatment, and alternative treatment measures are not in keeping with the patient's wishes at this time. He is essentially palliative care, and is electing palliative care at this time. He does not desire further extensive workup. Symptomatic treatment, and continued diuresis will be the plan going forward.  - Increase lasix 60mg  to TID IV.  - daily weights, mildly negative fluid status today.  - strict I/Os  - holding lisinopril and coreg.  - Continue to monitor blood pressure and electrolytes with diuresis - Continue prn O2 - currently 15L venturi - wean as tolerated - morphine for air hunger.  - EKG without evidence of acute MI. No chest pain. Bigeminy.  - Continue home: Symbicort, albuterol and atrovent prn  Dysuria: Patient reporting symptoms of dysuria. Also came in for concern for dark brown "tea-colored" urine. Urine with moderate leuks and many bacteria. Does not  meet Mcgeer criteria for tx of UTI.  - Pt. initially complained of frequency/ dysuria. he is on lasix - Continue to monitor.   - urine culture pending  Bilateral Leg pain: Possibly due to neuropathy secondary to diabetes vs claudication/PAD(Current smoker; Weak pulses in feet). B12 elevated. ABIs on last admission were abnormal: 0.77 on R, 0.66 on L. - Continue home gabapentin 600mg  BID; titrate up as needed  CKD Stage 3b: (Cr baseline 1.4). Cr on admission 1.03. - started on ACEi at last admission but has been noncompliant - holding ACEi in setting of soft BPs - Monitor creatinine with diuresis.  - Will likely develop some degree of cardiorenal syndrome.   DM2: A1c 11.2 (10/18/15) - Holding Amaryl and Januvia; patient reports not taking - SSI sensitive  Anemia (Macrocytic): Hgb 8.8 (new baseline ~9, baseline 13, 06/2015) , MCV 102.6, RDW elevated. Patient denying any blood loss. Work-up at last admission with iron panel, ferritin, folate normal; B12 elevated. Asymptomatic - continue to monitor - Transfusion threshold 7   FEN/GI: Heart Health; SLIV Prophylaxis: Subq Hep   Disposition: Possible transfer to stepdown.   Subjective:  Feels that his breathing has improved but per nursing he desatted to 75% when his mask fell off overnight. He reports he is peeing all the time.  Objective: Temp:  [97.8 F (36.6 C)-99.2 F (37.3 C)] 98.2 F (36.8 C) (12/26 0458) Pulse Rate:  [70-95] 92 (12/26 0458) Resp:  [20] 20 (12/26 0458) BP: (108-127)/(54-64) 112/54 mmHg (12/26 0458) SpO2:  [72 %-98 %] 94 % (12/26 0740) FiO2 (%):  [50 %-55 %] 50 % (12/26 0740) Weight:  [167 lb 14.4 oz (  76.159 kg)] 167 lb 14.4 oz (76.159 kg) (12/26 0458) Physical Exam: General: Resting in bed, with venturi mask in place. No distress.   Cardiovascular: Regular Rate, Intermittent skipped beats, no murmurs, no gallops. 2+ radial pulse.  Respiratory: Upper lung fields clear, left low lung field with distant  breath sounds.   Abdomen: S, NT, ND, +BS Extremities: Edema to the mid abdomen. Nontender to palpation of calves, no asymmetric swelling.   Laboratory:  Recent Labs Lab 10/29/15 1339 10/29/15 1955 10/30/15 0254  WBC 9.9 10.7* 12.3*  HGB 8.8* 8.9* 8.1*  HCT 28.1* 28.2* 25.4*  PLT 184 203 186    Recent Labs Lab 10/29/15 1339 10/29/15 1955 10/30/15 0254 10/31/15 0531  NA 136  --  133* 134*  K 4.0  --  3.8 3.7  CL 97*  --  95* 95*  CO2 30  --  30 30  BUN 14  --  13 15  CREATININE 1.03 1.19 1.20 1.22  CALCIUM 8.5*  --  7.8* 7.9*  PROT 7.2  --   --   --   BILITOT 2.4*  --   --   --   ALKPHOS 159*  --   --   --   ALT 15*  --   --   --   AST 18  --   --   --   GLUCOSE 216*  --  288* 311*   CXR: 12/25 1. Progression of bilateral interstitial edema/infiltrates. 2. Persistent pleural effusions with the left lower lung consolidation/atelectasis.  Abram Sander, MD 10/31/2015, 8:26 AM PGY-3, [facility] FPTS Intern pager: [phone], text pages welcome

## 2015-10-31 NOTE — Discharge Summary (Signed)
Family Medicine Teaching [facility] Discharge Summary  Patient name: Russell Hamilton Medical record number: [id number] Date of birth: 11/20/1940 Age: 74 y.o. Gender: male Date of Admission: 10/29/2015  Date of Discharge: 11/03/15 Admitting Physician: Uvaldo RisingKyle J Fletke, MD  Primary Care Provider: Ambrose FinlandValerie A Keck, NP Consultants: none  Indication for Hospitalization: dyspnea  Discharge Diagnoses/Problem List:  Patient Active Problem List   Diagnosis Date Noted  . Acute on chronic systolic CHF (congestive heart failure), NYHA class 4 (HCC)   . Shortness of breath   . Hypoxemia   . Dyspnea 10/29/2015  . SOB (shortness of breath)   . Left elbow pain   . Dementia   . Acute HF (heart failure) (HCC) 10/17/2015  . Palliative care encounter   . Observed seizure-like activity (HCC)   . 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 Personal history of noncompliance with medical treatment 09/21/2015  . Suicidal ideation   . CHF exacerbation (HCC) 09/20/2015  . Acute on chronic congestive heart failure (HCC)   . Chronic bronchitis (HCC)   . Secondary hypertension, unspecified   . Uncontrolled type 2 diabetes mellitus with ketoacidosis without coma, without long-term current use of insulin (HCC)   . Acute renal insufficiency 09/16/2015  . Renal insufficiency   . Sprain of left knee   . Fall at home   . Hypoxia   . Normochromic normocytic anemia   . Prolonged QT interval   . COPD exacerbation (HCC) 09/14/2015  . Chronic pain syndrome 09/08/2015  . Neutropenia (HCC) 07/13/2015  . CHF (congestive heart failure) (HCC) 07/13/2015  . Acute on chronic combined systolic and diastolic congestive heart failure (HCC) 06/12/2015  . Orthostatic hypotension 05/09/2015  . Tinea pedis 04/26/2015  . Benign paroxysmal positional vertigo 04/12/2015  . GERD (gastroesophageal reflux disease) 04/05/2015  . COPD (chronic obstructive pulmonary disease) (HCC) 03/26/2015  . COPD with acute  exacerbation (HCC) 03/26/2015  . Chronic kidney disease, stage 3 03/01/2015  . Type 2 diabetes mellitus with diabetic chronic kidney disease (HCC)   . Unstable angina (HCC) 02/25/2015  . Congestive heart failure (HCC) 02/24/2015  . Cough, persistent 02/08/2015  . Tobacco abuse 02/08/2015  . Acute diastolic CHF (congestive heart failure) (HCC) 12/12/2014  . Acute systolic CHF (congestive heart failure) (HCC) 12/12/2014  . Acute CHF (HCC) 12/11/2014  . AKI (acute kidney injury) (HCC) 11/22/2014  . Hypokalemia 11/21/2014  . Elevated troponin 11/20/2014  . Acute on chronic combined systolic and diastolic heart failure (HCC) 11/20/2014  . Foot ulcer (HCC) 11/20/2014  . NSTEMI (non-ST elevated myocardial infarction) (HCC)   . Pleural effusion   . Acute respiratory failure with hypoxia (HCC)   . Protein-calorie malnutrition, severe (HCC) 08/13/2014  . Chronic systolic CHF (congestive heart failure) (HCC)   . Type II diabetes mellitus with renal manifestations, uncontrolled (HCC)   . Hypertension   . Stroke (HCC)   . Acute systolic CHF (congestive heart failure), NYHA class 4 (HCC) 08/12/2014  . History of stroke 08/12/2014  . HTN (hypertension) 08/12/2014  . Peripheral neuropathy (HCC) 08/12/2014   Disposition: [facility]  Discharge Condition: Stable  Discharge Exam: Blood pressure 103/58, pulse 90, temperature 98.3 F (36.8 C), temperature source Oral, resp. rate 20, height 5' 9.5" (1.765 m), weight 167 lb 15.9 oz (76.2 kg), SpO2 95 %. General: Resting in bed, with venturi mask in place. Mild distress.  Cardiovascular: Regular Rate, rhythm, no murmurs, no gallops. 2+ radial pulse.  Respiratory: CTAB. Able to speak in full  sentences when Venturi mask removed.  Abdomen: S, NT, ND, +BS MSK: Left elbow warm to touch. Decreased ROM due to patient discomfort. Feet and hands TTP bilaterally. Dry 1x1 cm ulcer of left lateral foot.  Extremities: 2+ edema of LEs (L>R). Nontender to  palpation of calves.  Brief Hospital Course:  Patient presented with increased SOB. He was found to have elevated BNP and interstitial edema on CXR, so was subsequently admitted for treatment of acute decompensated heart failure.   Dyspnea 2/2 acute decompensated heart failure Patient was started on IV diuresis upon admission. He also required Venturi mask for O2 delivery, and morphine for air hunger. Patient is on home hospice, and thus did not want an extensive workup during this admission. Carvedilol was held for low blood pressures. Lasix will be continued IV at increased dose of 60 mg TID (increased from home dose of 60 mg PO BID) for symptom relief and comfort. Patient has been refusing nasal canula and has remained on venturi mask (50% FiO2, 12L) but was able to maintain O2 saturation above 90% for brief periods of time, to eat for example, on day of discharge.   Dysuria Patient complained of dysuria and dark brown "tea-colored" urine on admission. He also complained of increased urinary frequency, however this was attributed to increased dose of Lasix. UA showed moderate leukocytes and many bacteria, however he did not meet the McGeer criteria for treatment of UTI. Urine culture grew multiple species. He has a foley in place, as he is too weak to go to use even a urinal.   CKD Stage 3b ACEi held, as patient's blood pressures have been soft, and patient states he has not been taking it. SCr below baseline at 1.19 upon discharge.   DM2: A1c 11.2 (10/18/15) Patient reported that he has not been taking Amaryl or Januvia. Sugars have ranged from the high 100s to high 200s on SSI. Will discharge on Amaryl and Januvia for ease of treatment.   Issues for Follow Up:  1. Control of shortness of breath with morphine, IV lasix 60 mg TID, venturi mask and breathing treatments. Hope to wean off of venturi mask as able, as is uncomfortable for patient. 2. 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 Continue colchicine.  Taper off of prednisone when pain begins to improve.  3. Foley catheter. Remove as able when patient hopefully regains strength.  Significant Procedures: None  Significant Labs and Imaging:   Recent Labs Lab 10/30/15 0254 11/02/15 0336 11/03/15 0440  WBC 12.3* 20.0* 19.0*  HGB 8.1* 8.1* 7.9*  HCT 25.4* 25.9* 25.2*  PLT 186 186 188    Recent Labs Lab 10/29/15 1339  10/30/15 0254 10/31/15 0531 11/01/15 0955 11/02/15 0336 11/03/15 0440  NA 136  --  133* 134* 128* 132* 131*  K 4.0  --  3.8 3.7 3.8 3.6 3.2*  CL 97*  --  95* 95* 89* 91* 90*  CO2 30  --  32 34*  GLUCOSE 216*  --  288* 311* 239* 214* 269*  BUN 14  --  CREATININE 1.03  < > 1.20 1.22 1.39* 1.22 1.19  CALCIUM 8.5*  --  7.8* 7.9* 7.8* 7.9* 7.8*  ALKPHOS 159*  --   --   --   --   --   --   AST 18  --   --   --   --   --   --   ALT 15*  --   --   --   --   --   --  ALBUMIN 2.9*  --   --   --   --   --   --   < > = values in this interval not displayed. Results/Tests Pending at Time of Discharge: None  Discharge Medications:    Medication List    STOP taking these medications        carvedilol 3.125 MG tablet  Commonly known as:  COREG     furosemide 40 MG tablet  Commonly known as:  LASIX  Replaced by:  furosemide 10 MG/ML injection     levofloxacin 500 MG tablet  Commonly known as:  LEVAQUIN     lisinopril 2.5 MG tablet  Commonly known as:  PRINIVIL,ZESTRIL     oxyCODONE-acetaminophen 5-325 MG tablet  Commonly known as:  ROXICET     pregabalin 100 MG capsule  Commonly known as:  LYRICA     silver sulfADIAZINE 1 % cream  Commonly known as:  SILVADENE      TAKE these medications        acetaminophen 325 MG tablet  Commonly known as:  TYLENOL  Take 2 tablets (650 mg total) by mouth every 6 (six) hours as needed for mild pain (or Fever >/= 101).     albuterol 108 (90 Base) MCG/ACT inhaler  Commonly known as:  PROVENTIL HFA;VENTOLIN HFA  Inhale 2 puffs into the  lungs every 6 (six) hours as needed for wheezing or shortness of breath.     budesonide-formoterol 80-4.5 MCG/ACT inhaler  Commonly known as:  SYMBICORT  Inhale 2 puffs into the lungs 2 (two) times daily.     colchicine 0.6 MG tablet  Take 1 tablet (0.6 mg total) by mouth daily.     Fluticasone Furoate-Vilanterol 100-25 MCG/INH Aepb  Commonly known as:  BREO ELLIPTA  Inhale 1 puff into the lungs daily.     furosemide 10 MG/ML injection  Commonly known as:  LASIX  Inject 6 mLs (60 mg total) into the vein 3 (three) times daily.     gabapentin 300 MG capsule  Commonly known as:  NEURONTIN  Take 600 mg by mouth 2 (two) times daily.     glimepiride 2 MG tablet  Commonly known as:  AMARYL  Take 1 tablet (2 mg total) by mouth daily with breakfast.     ipratropium 0.02 % nebulizer solution  Commonly known as:  ATROVENT  Take 2.5 mLs (0.5 mg total) by nebulization every 6 (six) hours as needed for wheezing or shortness of breath.     morphine 2 MG/ML injection  Inject 0.5 mLs (1 mg total) into the vein every 4 (four) hours as needed (air hunger/SOB).     morphine CONCENTRATE 10 MG/0.5ML Soln concentrated solution  Take 0.13 mLs (2.6 mg total) by mouth every 4 (four) hours as needed for moderate pain, severe pain or shortness of breath.     omeprazole 20 MG capsule  Commonly known as:  PRILOSEC  Take 1 capsule (20 mg total) by mouth daily.     predniSONE 10 MG tablet  Commonly known as:  DELTASONE  Take 3 tablets (30 mg total) by mouth daily with breakfast. Continue until left elbow pain improves. Taper off over the course of a week, e.g. decrease by 5 mg daily.     sitaGLIPtin 50 MG tablet  Commonly known as:  JANUVIA  Take 1 tablet (50 mg total) by mouth daily. For diabetes        Discharge Instructions: Please refer to Patient Instructions section  of EMR for full details.  Patient was counseled important signs and symptoms that should prompt return to medical care, changes  in medications, dietary instructions, activity restrictions, and follow up appointments.   Follow-Up Appointments: Symptom relief per [facility]   Casey Burkitt, MD 11/03/2015, 10:34 AM PGY-1, [facility]

## 2015-10-31 NOTE — Progress Notes (Signed)
MD on call informed that pt is very irritable and taking his venturi mask off stats dropping awaiting new orders. Ilean SkillVeronica Mechel Haggard LPN

## 2015-11-01 ENCOUNTER — Inpatient Hospital Stay: Payer: Self-pay | Admitting: Family Medicine

## 2015-11-01 DIAGNOSIS — I5023 Acute on chronic systolic (congestive) heart failure: Principal | ICD-10-CM

## 2015-11-01 LAB — BASIC METABOLIC PANEL
Anion gap: 9 (ref 5–15)
BUN: 15 mg/dL (ref 6–20)
CALCIUM: 7.8 mg/dL — AB (ref 8.9–10.3)
CO2: 30 mmol/L (ref 22–32)
CREATININE: 1.39 mg/dL — AB (ref 0.61–1.24)
Chloride: 89 mmol/L — ABNORMAL LOW (ref 101–111)
GFR calc Af Amer: 57 mL/min — ABNORMAL LOW (ref >60)
GFR, EST NON AFRICAN AMERICAN: 49 mL/min — AB (ref >60)
Glucose, Bld: 239 mg/dL — ABNORMAL HIGH (ref 65–99)
Potassium: 3.8 mmol/L (ref 3.5–5.1)
SODIUM: 128 mmol/L — AB (ref 135–145)

## 2015-11-01 LAB — GLUCOSE, CAPILLARY
GLUCOSE-CAPILLARY: 171 mg/dL — AB (ref 65–99)
Glucose-Capillary: 194 mg/dL — ABNORMAL HIGH (ref 65–99)
Glucose-Capillary: 185 mg/dL — ABNORMAL HIGH (ref 65–99)
Glucose-Capillary: 207 mg/dL — ABNORMAL HIGH (ref 65–99)

## 2015-11-01 MED ORDER — COLCHICINE 0.6 MG PO TABS
0.6000 mg | ORAL_TABLET | Freq: Every day | ORAL | Status: DC
  Administered 2015-11-01 – 2015-11-03 (×3): 0.6 mg via ORAL
  Filled 2015-11-01 (×3): qty 1

## 2015-11-01 NOTE — Progress Notes (Signed)
Family Medicine Teaching Service Daily Progress Note Intern Pager: [phone]  Patient name: Russell Hamilton Medical record number: [id number] Date of birth: 02/20/1941 Age: 74 y.o. Gender: male  Primary Care Provider: Ambrose FinlandValerie A Keck, NP Consultants: none Code Status: DNR  Pt Overview and Major Events to Date:  12/24: admitted with CHF exacerbation.  12/25: Worsening SOB.   Assessment and Plan: Russell Hamilton is a 74 y.o. male presenting with SOB. 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   Dyspnea, Acute Decompensated Heart Failure: Elevation in QIO(9629BNP(1627) as well as evidence of interstitial edema on chest x-ray. On hospice for HF. HFrEF Echo 07/2015: EF 10-15%, diffuse hypokinesis, mild aortic stenosis.Long history of noncompliance for HF medications. Not a candidate for inotropic therapy. Second admission this month. He is on Home hospice, and is continuing to elect hospice care here. This is likely continued cardiac decompensation that continues to be refractory to treatment, and alternative treatment measures are not in keeping with the patient's wishes at this time. He is essentially palliative care, and is electing palliative care at this time. He does not desire further extensive workup. Symptomatic treatment, and continued diuresis will be the plan going forward.  - Continue increased lasix 60mg  to TID IV.  - daily weights, up 3 lbs since admission.  - strict I/Os, 0.9 L negative fluid balance  - holding lisinopril and coreg.  - Continue to monitor blood pressure and electrolytes with diuresis - Continue prn O2 - currently 12L venturi - wean as tolerated - morphine for air hunger.  - EKG without evidence of acute MI. No chest pain. Bigeminy.  - Continue home: Symbicort, albuterol and atrovent prn - Consulted Palliative Care to determine [facility]  Elbow pain: Warm to touch. Had uric acid level of 8.8  last admission.  - Colchicine 0.6 mg ordered.  - Could also consider starting steroids.   Dysuria: Patient reporting symptoms of dysuria. Also came in for concern for dark brown "tea-colored" urine. Urine with moderate leuks and many bacteria. Does not meet Mcgeer criteria for tx of UTI.  - Pt. initially complained of frequency/ dysuria. He is on lasix - Continue to monitor.   - urine culture with multiple species present  Bilateral Leg pain: Possibly due to neuropathy secondary to diabetes vs claudication/PAD(Current smoker; Weak pulses in feet). B12 elevated. ABIs on last admission were abnormal: 0.77 on R, 0.66 on L. - Continue home gabapentin 600mg  BID; titrate up as needed  CKD Stage 3b: (Cr baseline 1.4). Cr on admission 1.03. - SCr 1.39 - started on ACEi at last admission but has been noncompliant - holding ACEi in setting of soft BPs - Monitor creatinine with diuresis.  - Will likely develop some degree of cardiorenal syndrome.   DM2: A1c 11.2 (10/18/15) - Holding Amaryl and Januvia; patient reports not taking - SSI sensitive  Anemia (Macrocytic): Hgb 8.8 (new baseline ~9, baseline 13, 06/2015) , MCV 102.6, RDW elevated. Patient denying any blood loss. Work-up at last admission with iron panel, ferritin, folate normal; B12 elevated. Asymptomatic - continue to monitor, order CBC for 12/27 - Transfusion threshold 7   FEN/GI: Heart Healthy; SLIV Prophylaxis: Subq Hep   Disposition: Continues to have significant dyspnea. Consider residential hospice placement.   Subjective:  - Unable to eat breakfast or dinner due to inability to take mask off for long enough; was able to drink a glass of orange juice  quickly this a.m.  - Complaining of severe pain in hands, feet and left elbow  Objective: Temp:  [98 F (36.7 C)-98.7 F (37.1 C)] 98.7 F (37.1 C) (12/27 0510) Pulse Rate:  [97-100] 97 (12/27 0510) Resp:  [20] 20 (12/27 0510) BP: (112-130)/(70-73) 119/73 mmHg (12/27  0510) SpO2:  [93 %-98 %] 98 % (12/27 0510) FiO2 (%):  [50 %] 50 % (12/26 2146) Weight:  [167 lb 14.4 oz (76.159 kg)-168 lb 12.8 oz (76.567 kg)] 168 lb 12.8 oz (76.567 kg) (12/27 0510) Physical Exam: General: Resting in bed, with venturi mask in place. Mild distress.    Cardiovascular: Regular Rate, rhythm, no murmurs, no gallops. 2+ radial pulse.  Respiratory: CTAB. Gasping for breath with venturi mask off for less than 1 minute.  Abdomen: S, NT, ND, +BS MSK: Left elbow warm to touch. Could not determine ROM due to patient discomfort. Feet and hands TTP bilaterally. Dry 1x1 cm ulcer of left lateral foot.  Extremities: 3+ edema of LEs. Nontender to palpation of calves, no asymmetric swelling.   Laboratory:  Recent Labs Lab 10/29/15 1339 10/29/15 1955 10/30/15 0254  WBC 9.9 10.7* 12.3*  HGB 8.8* 8.9* 8.1*  HCT 28.1* 28.2* 25.4*  PLT 184 203 186    Recent Labs Lab 10/29/15 1339 10/29/15 1955 10/30/15 0254 10/31/15 0531  NA 136  --  133* 134*  K 4.0  --  3.8 3.7  CL 97*  --  95* 95*  CO2 30  --  30 30  BUN 14  --  13 15  CREATININE 1.03 1.19 1.20 1.22  CALCIUM 8.5*  --  7.8* 7.9*  PROT 7.2  --   --   --   BILITOT 2.4*  --   --   --   ALKPHOS 159*  --   --   --   ALT 15*  --   --   --   AST 18  --   --   --   GLUCOSE 216*  --  288* 311*   CXR: 12/25 1. Progression of bilateral interstitial edema/infiltrates. 2. Persistent pleural effusions with the left lower lung consolidation/atelectasis.  Casey Burkitt, MD 11/01/2015, 7:11 AM PGY-1, [facility] Family Medicine FPTS Intern pager: [phone], text pages welcome

## 2015-11-01 NOTE — Progress Notes (Signed)
Continuous pulse ox and condom catheter applied to patient. Pt is intermittently incontinent and noncompliant with Venturi mask. Will continue to monitor.

## 2015-11-01 NOTE — Care Management Important Message (Signed)
Important Message  Patient Details  Name: Nat MathJames Menchaca MRN: [id number] Date of Birth: 08/06/1941   Medicare Important Message Given:  Yes    Kyla BalzarineShealy, Nikesha Kwasny Abena 11/01/2015, 12:09 PM

## 2015-11-01 NOTE — Progress Notes (Signed)
Talked to Misty StanleyLisa with [facility] about the appropriateness for Inpatient Hospice; CM talked to patient with his brother and other family members in the room about DCP; patient is in agreement for Inpatient Hospice. Choice offered for facility, [facility]. Lisa with [facility] checking for bed availability. Abelino DerrickB Denitra Donaghey [facility] CenterRN,MHA,BSN [phone]

## 2015-11-01 NOTE — Hospital Discharge Follow-Up (Signed)
[facility].   Attempted to meet with him  twice this afternoon; but  he was going to the bathroom.  Spoke with Jiles CrockerBrenda Chandler, RN CM who noted that they are awaiting a palliative consult and then a [facility].

## 2015-11-01 NOTE — Progress Notes (Signed)
[facility] rm 3 E 21 [facility] RN visit. GIP related admission to [facility] Dx: Systolic Heart Failure pt. Is a DNR code status. Patient sitting up in bed dozing. His venturi mask is on 12 L with a FiO2 (%)  Of 50 and resps regular. Pt's brother, niece and other family members present. Family reports he ate a few spoonfuls of peaches and pears today but that was all. Pt. appears paler and states yes when asked if he was more short of breath today. Liaison discussed with patient and family the option of [facility] when eligible. Family advised that at the moment he is not eligible to transfer his care there. Liaison spoke with [facility] and updated her.  [facility] wil continue to follow daily. Please call with any hospice questions.  Sharen HeckLisa Strandberg RN [facility] Hospital Liaison [phone]

## 2015-11-01 NOTE — Progress Notes (Signed)
[facility] Social Work note Patient remains on hospice care at home. He returned home last week and voiced desire to go to Rehab facility as he could not manage at home. LCSW was unable to secure a bed and patient was aware. Family can assist with some meals, yet he is home without care most of the time. He appears much more deconditioned that at last admit. Patient admits he cannot walk and is unsure of what to do. He was wearing re-breather mask and was having difficulty communicating. LCSW reviewed safety and care concerns at home, possible need for out of home option. [facility], yet [facility] has not reviewed for eligibility at the time of this writing. Patient shared need to "think about it". He asked for something to drink and LCSW rang call bell for assistance and he was brought drink. LCSW contacted his niece, Archie Pattenonya and brother, Gerlene BurdockRichard to address care needs at discharge.  LCSW will assist with [facility] if [facility].  Orson GearMelanie [place], [place] [phone]

## 2015-11-02 LAB — BASIC METABOLIC PANEL
Anion gap: 9 (ref 5–15)
BUN: 16 mg/dL (ref 6–20)
CHLORIDE: 91 mmol/L — AB (ref 101–111)
CO2: 32 mmol/L (ref 22–32)
CREATININE: 1.22 mg/dL (ref 0.61–1.24)
Calcium: 7.9 mg/dL — ABNORMAL LOW (ref 8.9–10.3)
GFR calc Af Amer: >60 mL/min (ref >60)
GFR calc non Af Amer: 57 mL/min — ABNORMAL LOW (ref >60)
GLUCOSE: 214 mg/dL — AB (ref 65–99)
POTASSIUM: 3.6 mmol/L (ref 3.5–5.1)
SODIUM: 132 mmol/L — AB (ref 135–145)

## 2015-11-02 LAB — GLUCOSE, CAPILLARY
GLUCOSE-CAPILLARY: 236 mg/dL — AB (ref 65–99)
GLUCOSE-CAPILLARY: 195 mg/dL — AB (ref 65–99)
Glucose-Capillary: 225 mg/dL — ABNORMAL HIGH (ref 65–99)
Glucose-Capillary: 283 mg/dL — ABNORMAL HIGH (ref 65–99)

## 2015-11-02 LAB — CBC
HEMATOCRIT: 25.9 % — AB (ref 39.0–52.0)
Hemoglobin: 8.1 g/dL — ABNORMAL LOW (ref 13.0–17.0)
MCH: 32.1 pg (ref 26.0–34.0)
MCHC: 31.3 g/dL (ref 30.0–36.0)
MCV: 102.8 fL — ABNORMAL HIGH (ref 78.0–100.0)
Platelets: 186 10*3/uL (ref 150–400)
RBC: 2.52 MIL/uL — ABNORMAL LOW (ref 4.22–5.81)
RDW: 21.5 % — ABNORMAL HIGH (ref 11.5–15.5)
WBC: 20 10*3/uL — AB (ref 4.0–10.5)

## 2015-11-02 NOTE — Progress Notes (Signed)
Hospice and Palliative Care Social Work note LCSW and hospital liaison RN, Darnelle MaffucciStacie attempted to meet with patient, yet he was quite drowsy and not able to fully communicate. Patient eventually aroused and indicated that his arm was hurting. He moaned several times during visit. He was medicated during visit.   Extended family arrived and [facility] as [facility] physician determined patient meets eligibility. Patient and family agree to bed offer. Dr. Leveda AnnaHensel arrived and was informed of offer. [facility]. Orson GearMelanie [place], [place]  [phone]

## 2015-11-02 NOTE — Progress Notes (Signed)
Family Medicine Teaching Service Daily Progress Note Intern Pager: [phone]  Patient name: Belal Scallon Medical record number: [id number] Date of birth: 08-19-1941 Age: 74 y.o. Gender: male  Primary Care Provider: Ambrose Finland, NP Consultants: none Code Status: DNR  Pt Overview and Major Events to Date:  12/24: admitted with CHF exacerbation.  12/25: Worsening SOB.   Assessment and Plan: Firas Guardado is a 74 y.o. male presenting with SOB. 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   Dyspnea, Acute Decompensated Heart Failure: Elevation in GNF(6213) as well as evidence of interstitial edema on chest x-ray. On hospice for HF. HFrEF Echo 07/2015: EF 10-15%, diffuse hypokinesis, mild aortic stenosis.Long history of noncompliance for HF medications. Not a candidate for inotropic therapy. Second admission this month. He is on Home hospice, and is continuing to elect hospice care here. This is likely continued cardiac decompensation that continues to be refractory to treatment, and alternative treatment measures are not in keeping with the patient's wishes at this time. He is essentially palliative care, and is electing palliative care at this time. He does not desire further extensive workup. Symptomatic treatment, and continued diuresis will be the plan going forward.  - Continue increased lasix  to TID IV.  - daily weights, up 3 lbs since admission, weight stable today  - strict I/Os, -0.57 negative fluid balance  - holding lisinopril and coreg.  - Continue to monitor blood pressure and electrolytes with diuresis - Continue prn O2 - currently wearing [place]  - morphine for air hunger.  - Continue home: Symbicort, albuterol and atrovent prn - Consulted Palliative Care to determine if [facility] would be an option  Elbow pain: Warm to touch. Had uric acid level of 8.8 last admission.  - Colchicine 0.6 mg ordered.  - Could also  consider starting steroids.   Dysuria: Patient reporting symptoms of dysuria. Also came in for concern for dark brown "tea-colored" urine. Urine with moderate leuks and many bacteria. Does not meet Mcgeer criteria for tx of UTI.  - Pt. initially complained of frequency/ dysuria. He is on lasix - Continue to monitor.   - urine culture with multiple species present  Bilateral Leg pain: Possibly due to neuropathy secondary to diabetes vs claudication/PAD(Current smoker; Weak pulses in feet). B12 elevated. ABIs on last admission were abnormal: 0.77 on R, 0.66 on L. - Continue home gabapentin  BID; titrate up as needed  CKD Stage 3b: (Cr baseline 1.4). Cr on admission 1.03. - SCr 1.22 - started on ACEi at last admission but has been noncompliant - holding ACEi in setting of soft BPs - Monitor creatinine with diuresis.  - Will likely develop some degree of cardiorenal syndrome.   DM2: A1c 11.2 (10/18/15) - Holding Amaryl and Januvia; patient reports not taking - SSI sensitive  Anemia (Macrocytic): Hgb 8.8 (new baseline ~9, baseline 13, 06/2015) , MCV 102.6, RDW elevated. Patient denying any blood loss. Work-up at last admission with iron panel, ferritin, folate normal; B12 elevated. Asymptomatic - continue to monitor, HgB 8.1  - Transfusion threshold 7   FEN/GI: Heart Healthy; SLIV Prophylaxis: Subq Hep   Disposition: Continues to have significant dyspnea. Consider residential hospice placement.   Subjective:  Complains that venturi mask bothered him too much and he couldn't do anything with it on.   Objective: Temp:  [98.3 F (36.8 C)-98.7 F (37.1 C)] 98.3 F (36.8 C) (12/27 2131) Pulse Rate:  [96-97] 96 (12/27  2131) Resp:  [20-22] 22 (12/27 2131) BP: (102-124)/(56-73) 124/56 mmHg (12/27 2131) SpO2:  [93 %-98 %] 94 % (12/27 2131) FiO2 (%):  [50 %] 50 % (12/27 2121) Weight:  [168 lb 12.8 oz (76.567 kg)] 168 lb 12.8 oz (76.567 kg) (12/27 0510) Physical Exam: General:  Resting in bed, with venturi mask in place. Mild distress.    Cardiovascular: Regular Rate, rhythm, no murmurs, no gallops. 2+ radial pulse.  Respiratory: CTAB. [place] in place. Able to speak in full sentences.  Abdomen: S, NT, ND, +BS MSK: Left elbow warm to touch. Could not determine ROM due to patient discomfort. Feet and hands TTP bilaterally. Dry 1x1 cm ulcer of left lateral foot.  Extremities: 2+ edema of LEs. Nontender to palpation of calves, no asymmetric swelling.   Laboratory:  Recent Labs Lab 10/29/15 1339 10/29/15 1955 10/30/15 0254  WBC 9.9 10.7* 12.3*  HGB 8.8* 8.9* 8.1*  HCT 28.1* 28.2* 25.4*  PLT 184 203 186    Recent Labs Lab 10/29/15 1339  10/30/15 0254 10/31/15 0531 11/01/15 0955  NA 136  --  133* 134* 128*  K 4.0  --  3.8 3.7 3.8  CL 97*  --  95* 95* 89*  CO2 30  --  30 30 30   BUN 14  --  13 15 15   CREATININE 1.03  < > 1.20 1.22 1.39*  CALCIUM 8.5*  --  7.8* 7.9* 7.8*  PROT 7.2  --   --   --   --   BILITOT 2.4*  --   --   --   --   ALKPHOS 159*  --   --   --   --   ALT 15*  --   --   --   --   AST 18  --   --   --   --   GLUCOSE 216*  --  288* 311* 239*  < > = values in this interval not displayed. CXR: 12/25 1. Progression of bilateral interstitial edema/infiltrates. 2. Persistent pleural effusions with the left lower lung consolidation/atelectasis.  Arvilla Marketatherine Lauren Elex Mainwaring, DO 11/02/2015, 4:28 AM PGY-1, [facility] FPTS Intern pager: [phone], text pages welcome

## 2015-11-02 NOTE — Hospital Discharge Follow-Up (Signed)
Met with the patient and his niece, Kenney Houseman. He noted that he is agreeable to going to [facility] and he is now waiting for a bed.  He said that he can't walk and he understands that he needs more assistance.   Olga Coaster, RN CM informed this CM that they are just waiting for a bed at [facility].

## 2015-11-02 NOTE — Progress Notes (Signed)
Inpatient [facility] rm 3 E 21 [facility].  This is a GIP related admission to [facility] Dx: Systolic Heart Failure. Patient is a DNR. Patient seen in room, resting in bed.  He is lethargic and oriented to person and place. He has venturi mask on at 50% FiO2/ 12 L/min with O2 sats between 94-96% and respirations WNL. Per chart review, patient has received 2 doses of 2mg  Morphine IV and 1 dose 2.6mg  Roxanol po for pain in the past 24 hours. [facility] social worker present to assist with discharge options.  [facility] will continue to follow daily. Please call with any hospice questions.  Hessie KnowsStacie Wilkinson RN, BSN [facility] Hospital Liaison [phone]

## 2015-11-03 LAB — BASIC METABOLIC PANEL
ANION GAP: 7 (ref 5–15)
BUN: 20 mg/dL (ref 6–20)
CO2: 34 mmol/L — AB (ref 22–32)
Calcium: 7.8 mg/dL — ABNORMAL LOW (ref 8.9–10.3)
Chloride: 90 mmol/L — ABNORMAL LOW (ref 101–111)
Creatinine, Ser: 1.19 mg/dL (ref 0.61–1.24)
GFR, EST NON AFRICAN AMERICAN: 59 mL/min — AB (ref >60)
GLUCOSE: 269 mg/dL — AB (ref 65–99)
POTASSIUM: 3.2 mmol/L — AB (ref 3.5–5.1)
Sodium: 131 mmol/L — ABNORMAL LOW (ref 135–145)

## 2015-11-03 LAB — GLUCOSE, CAPILLARY
GLUCOSE-CAPILLARY: 268 mg/dL — AB (ref 65–99)
GLUCOSE-CAPILLARY: 305 mg/dL — AB (ref 65–99)

## 2015-11-03 LAB — CBC
HEMATOCRIT: 25.2 % — AB (ref 39.0–52.0)
HEMOGLOBIN: 7.9 g/dL — AB (ref 13.0–17.0)
MCH: 32.2 pg (ref 26.0–34.0)
MCHC: 31.3 g/dL (ref 30.0–36.0)
MCV: 102.9 fL — ABNORMAL HIGH (ref 78.0–100.0)
Platelets: 188 10*3/uL (ref 150–400)
RBC: 2.45 MIL/uL — AB (ref 4.22–5.81)
RDW: 21.4 % — ABNORMAL HIGH (ref 11.5–15.5)
WBC: 19 10*3/uL — ABNORMAL HIGH (ref 4.0–10.5)

## 2015-11-03 MED ORDER — MORPHINE SULFATE (CONCENTRATE) 10 MG/0.5ML PO SOLN: 2.5000 mg | ORAL | Status: AC | PRN

## 2015-11-03 MED ORDER — BUDESONIDE-FORMOTEROL FUMARATE 80-4.5 MCG/ACT IN AERO: 2.0000 | INHALATION_SPRAY | Freq: Two times a day (BID) | RESPIRATORY_TRACT | Status: AC

## 2015-11-03 MED ORDER — FUROSEMIDE 10 MG/ML IJ SOLN: 60.0000 mg | Freq: Three times a day (TID) | INTRAMUSCULAR | Status: AC

## 2015-11-03 MED ORDER — PREDNISONE 20 MG PO TABS: 30.0000 mg | ORAL_TABLET | Freq: Every day | ORAL | Status: DC

## 2015-11-03 MED ORDER — MORPHINE SULFATE (PF) 2 MG/ML IV SOLN: 1.0000 mg | INTRAVENOUS | Status: AC | PRN

## 2015-11-03 MED ORDER — PREDNISONE 10 MG PO TABS: 30.0000 mg | ORAL_TABLET | Freq: Every day | ORAL | Status: AC

## 2015-11-03 MED ORDER — ACETAMINOPHEN 325 MG PO TABS: 650.0000 mg | ORAL_TABLET | Freq: Four times a day (QID) | ORAL | Status: AC | PRN

## 2015-11-03 NOTE — Discharge Summary (Addendum)
[facility] with IV on for probable lasix IV there, discharge package provided to the transport, tele DC, Report provided to the Nurse on the Unit. pt left with all of his belongings, Vitals stable prior to discharge.

## 2015-11-03 NOTE — Progress Notes (Signed)
Family Medicine Teaching Service Daily Progress Note Intern Pager: [phone]  Patient name: Russell Hamilton Medical record number: [id number] Date of birth: 10/01/1941 Age: 74 y.o. Gender: male  Primary Care Provider: Ambrose FinlandValerie A Keck, NP Consultants: none Code Status: DNR  Pt Overview and Major Events to Date:  12/24: admitted with CHF exacerbation.  12/25: Worsening SOB.   Assessment and Plan: Russell Hamilton is a 74 y.o. male presenting with SOB. 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   Dyspnea, Acute Decompensated Heart Failure: Elevation in OAC(1660BNP(1627) as well as evidence of interstitial edema on chest x-ray. On hospice for HF. HFrEF Echo 07/2015: EF 10-15%, diffuse hypokinesis, mild aortic stenosis.Long history of noncompliance for HF medications. Not a candidate for inotropic therapy. Second admission this month. He is on Home hospice, and is continuing to elect hospice care here. This is likely continued cardiac decompensation that continues to be refractory to treatment, and alternative treatment measures are not in keeping with the patient's wishes at this time. He is essentially palliative care, and is electing palliative care at this time. He does not desire further extensive workup. Symptomatic treatment, and continued diuresis will be the plan going forward.  - Continue increased lasix 60mg  to TID IV.  - daily weights, up 3 lbs since admission, weight stable today  - strict I/Os, -0.22 negative fluid balance  - holding lisinopril and coreg.  - Continue to monitor blood pressure and electrolytes with diuresis - Continue prn O2  - morphine for air hunger.  - Continue home: Symbicort, albuterol and atrovent prn - Consulted Palliative Care [facility], awaiting bed for placement  Elbow pain: Warm to touch. Had uric acid level of 8.8 last admission.  - Colchicine 0.6 mg ordered.  - Will start prednisone for  symptomatic relief  Dysuria: Patient reporting symptoms of dysuria. Also came in for concern for dark brown "tea-colored" urine. Urine with moderate leuks and many bacteria. Does not meet Mcgeer criteria for tx of UTI.  - Pt. initially complained of frequency/ dysuria. He is on lasix - Continue to monitor.   - urine culture with multiple species present  Bilateral Leg pain: Possibly due to neuropathy secondary to diabetes vs claudication/PAD(Current smoker; Weak pulses in feet). B12 elevated. ABIs on last admission were abnormal: 0.77 on R, 0.66 on L. - Continue home gabapentin 600mg  BID; titrate up as needed  CKD Stage 3b: (Cr baseline 1.4). Cr on admission 1.03. - SCr 1.19 - started on ACEi at last admission but has been noncompliant - holding ACEi in setting of soft BPs - Monitor creatinine with diuresis.  - Will likely develop some degree of cardiorenal syndrome.   DM2: A1c 11.2 (10/18/15); CBGs have been in the high 100s, mid 200s - Holding Amaryl and Januvia; patient reports not taking - SSI sensitive, consider increasing to moderate coverage  Anemia (Macrocytic): Hgb 8.8 (new baseline ~9, baseline 13, 06/2015) , MCV 102.6, RDW elevated. Patient denying any blood loss. Work-up at last admission with iron panel, ferritin, folate normal; B12 elevated. Asymptomatic - continue to monitor, HgB 7.9 - Transfusion threshold 7   FEN/GI: Heart Healthy; SLIV Prophylaxis: Subq Hep   Disposition: Continues to have significant dyspnea. Ready for discharge when [facility].  Subjective:  -Very hungry this am -Has been able to maintain oxygen saturations above 90% when taking deep breaths without mask -Does not want to use a Nasal Canula  Objective: Temp:  [98.3 F (36.8 C)-100.2 F (37.9 C)] 98.3 F (36.8 C) (12/29 0447) Pulse Rate:  [89-110] 90 (12/29 0447) Resp:  [20] 20 (12/29 0447) BP: (100-118)/(58-60) 103/58 mmHg (12/29 0447) SpO2:  [94 %-96 %] 95 % (12/29  0447) FiO2 (%):  [50 %-95 %] 95 % (12/29 0447) Weight:  [167 lb 15.9 oz (76.2 kg)] 167 lb 15.9 oz (76.2 kg) (12/29 0346) Physical Exam: General: Resting in bed, with venturi mask in place. Mild distress.    Cardiovascular: Regular Rate, rhythm, no murmurs, no gallops. 2+ radial pulse.  Respiratory: CTAB. Able to speak in full sentences when Venturi mask removed.  Abdomen: S, NT, ND, +BS MSK: Left elbow warm to touch. Decreased ROM due to patient discomfort. Feet and hands TTP bilaterally. Dry 1x1 cm ulcer of left lateral foot.  Extremities: 2+ edema of LEs (L>R). Nontender to palpation of calves.  Laboratory:  Recent Labs Lab 10/30/15 0254 11/02/15 0336 11/03/15 0440  WBC 12.3* 20.0* PENDING  HGB 8.1* 8.1* 7.9*  HCT 25.4* 25.9* 25.2*  PLT 186 186 PENDING    Recent Labs Lab 10/29/15 1339  11/01/15 0955 11/02/15 0336 11/03/15 0440  NA 136  < > 128* 132* 131*  K 4.0  < > 3.8 3.6 3.2*  CL 97*  < > 89* 91* 90*  CO2 30  < > 30 32 34*  BUN 14  < > CREATININE 1.03  < > 1.39* 1.22 1.19  CALCIUM 8.5*  < > 7.8* 7.9* 7.8*  PROT 7.2  --   --   --   --   BILITOT 2.4*  --   --   --   --   ALKPHOS 159*  --   --   --   --   ALT 15*  --   --   --   --   AST 18  --   --   --   --   GLUCOSE 216*  < > 239* 214* 269*  < > = values in this interval not displayed. CXR: 12/25 1. Progression of bilateral interstitial edema/infiltrates. 2. Persistent pleural effusions with the left lower lung consolidation/atelectasis.  Casey Burkitt, MD 11/03/2015, 6:54 AM PGY-1, [facility] Family Medicine FPTS Intern pager: [phone], text pages welcome

## 2015-11-03 NOTE — Progress Notes (Signed)
CSW notified late yesterday afternoon by Hospice and Palliative Care Social Worker that [facility]  MD:  Please complete d/c summary ASAP as facility requests early d/c.  Please sign out of facility DNR from on front of chart.  Thanks.  Lorri Frederickonna T. Jaci LazierCrowder, [place] [phone]

## 2015-11-06 DEATH — deceased

## 2015-11-09 ENCOUNTER — Telehealth: Payer: Self-pay | Admitting: Family Medicine

## 2015-11-09 NOTE — Telephone Encounter (Signed)
Certificate of Death form needs to be filled out. Thank you

## 2015-11-09 NOTE — Telephone Encounter (Signed)
Filled out and funeral home notified to pick up.  Death certificate handed to Tilton Northfieldarmen at front desk.

## 2015-11-10 ENCOUNTER — Telehealth: Payer: Self-pay | Admitting: Internal Medicine

## 2015-11-10 NOTE — Telephone Encounter (Signed)
Funeral Home came and picked up death certificate.

## 2015-11-18 ENCOUNTER — Telehealth: Payer: Self-pay | Admitting: Internal Medicine

## 2015-11-18 NOTE — Telephone Encounter (Signed)
Hospice called stating that paperwork for orders were faxed and that they are needing the orders for their records. Hospice stated that they will fax another form. They are aware that patient is deceased. PLease follow up.

## 2015-11-21 NOTE — Telephone Encounter (Signed)
Left message for Russell Hamilton at hospice and asked her to refax necessary paperwork and RN would have MD fill out and fax back to her.  Left RN phone number if she had questions.

## 2015-11-22 NOTE — Telephone Encounter (Signed)
Since call to [facility] Dr. Venetia Night gave this RN paperwork to fax to Hospice.  Paperwork faxed this morning.

## 2017-06-12 IMAGING — MR MR HEAD W/O CM
10 of 11 series · 35 of 48 positions shown · non-contrast
Comparison: CT head 05/10/2015

CLINICAL DATA: Seizure

EXAM:
MRI HEAD WITHOUT CONTRAST
TECHNIQUE: Multiplanar, multiecho pulse sequences of the brain and surrounding
structures were obtained without intravenous contrast.

[Series 3: DWI · axial · 3.0mm · 1.09mm/px · z∈[+9,+140]mm · 8 of 90 slices shown (1 of 4)]
[im 1/90]
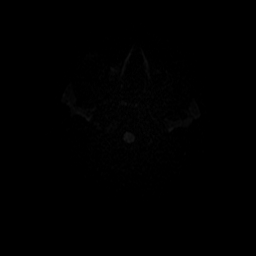
[im 10/90]
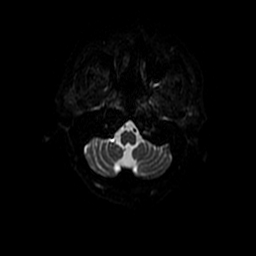
[im 30/90]
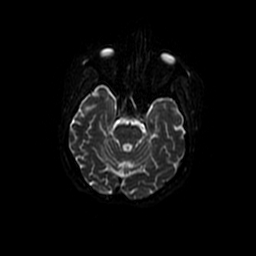
[im 40/90]
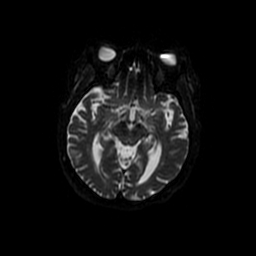
[im 50/90]
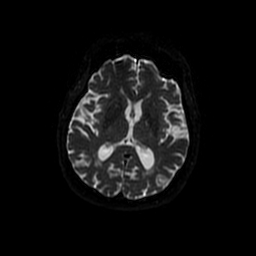
[im 60/90]
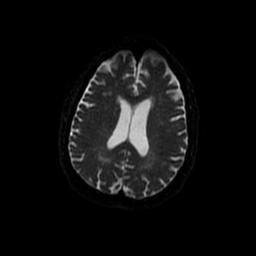
[im 80/90]
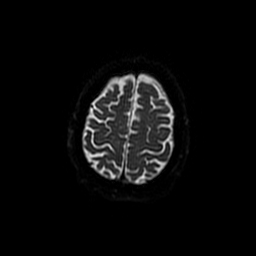
[im 90/90]
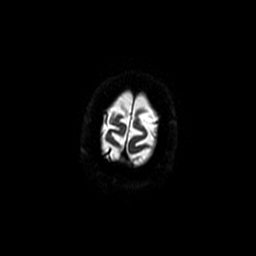

[Series 4: T1 · sagittal · 5.0mm · 0.47mm/px · 2 of 23 slices shown]
[im 1/23]
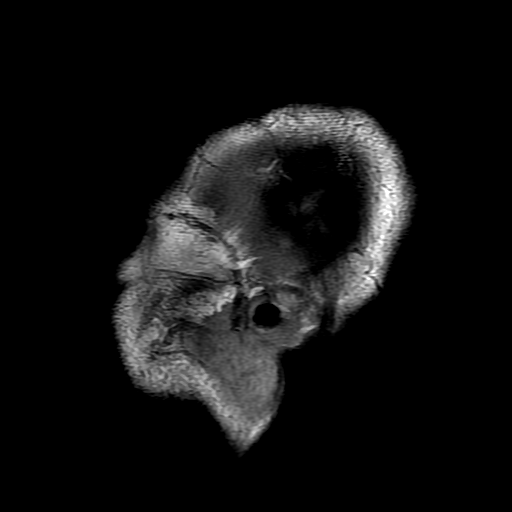
[im 23/23]
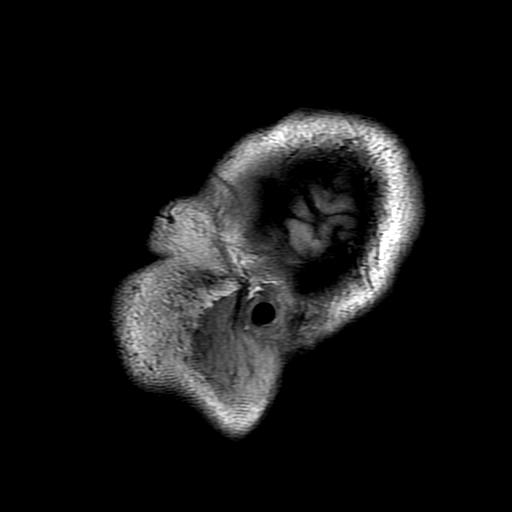

[Series 5: T2 · axial · 5.0mm · 0.43mm/px · z∈[+4,+135]mm · 2 of 23 slices shown (1 of 3)]
[im 1/23]
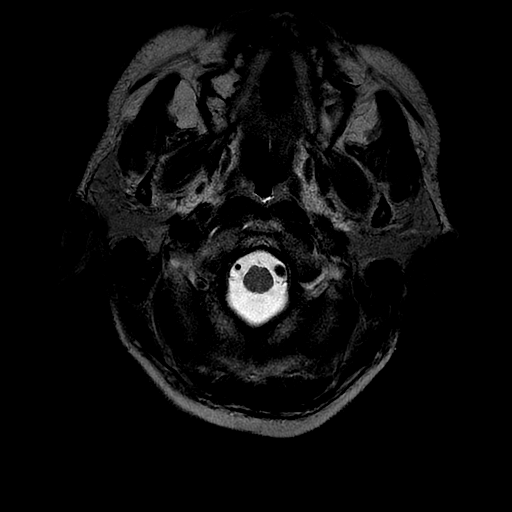
[im 23/23]
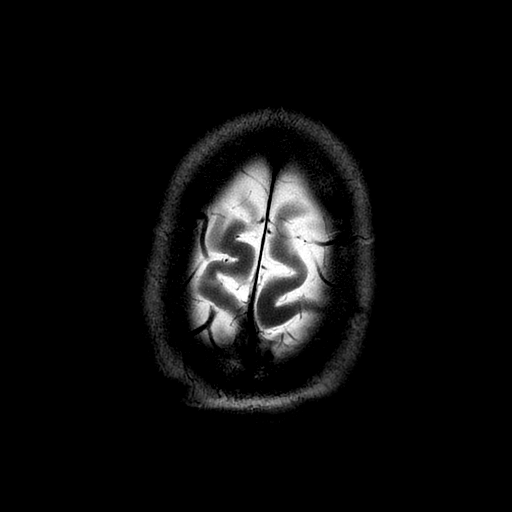

[Series 6: FLAIR · axial · 5.0mm · 0.43mm/px · z∈[+4,+135]mm · 2 of 23 slices shown]
[im 1/23]
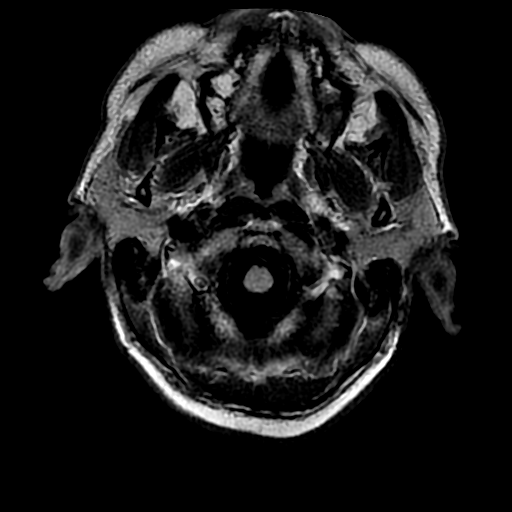
[im 23/23]
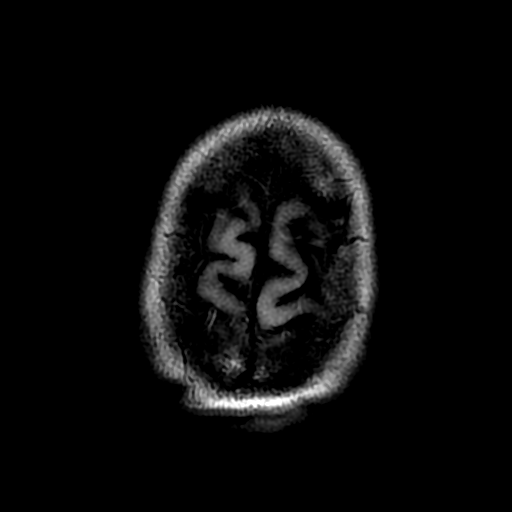

[Series 7: T2 · coronal · 3.0mm · 0.39mm/px · 2 of 22 slices shown (2 of 3)]
[im 1/22]
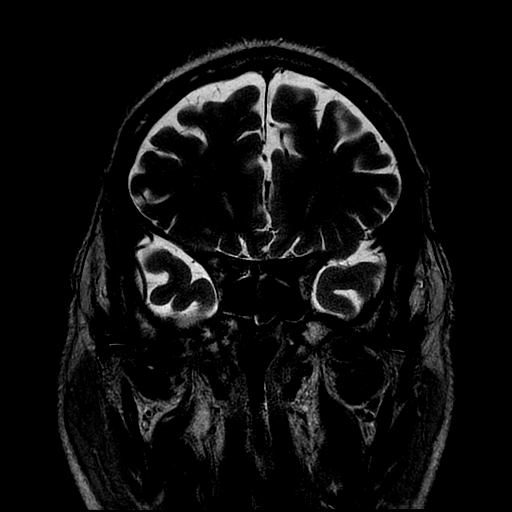
[im 22/22]
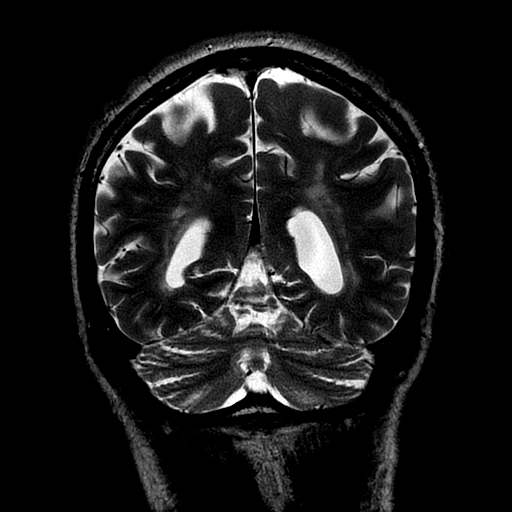

[Series 8: T2 · coronal · 5.0mm · 0.43mm/px · 3 of 26 slices shown (3 of 3)]
[im 1/26]
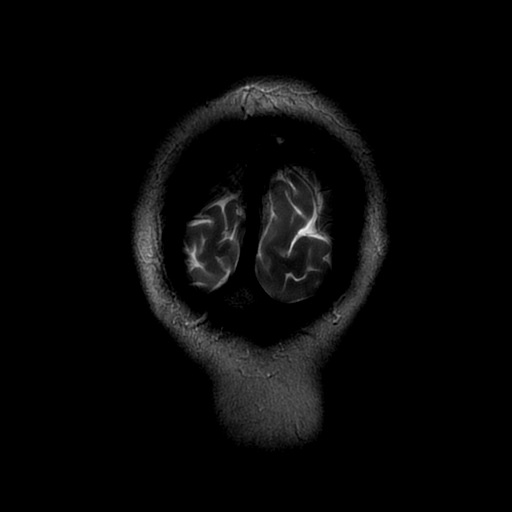
[im 13/26]
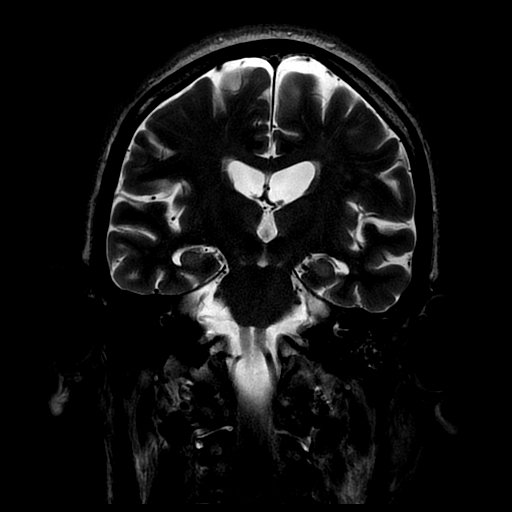
[im 26/26]
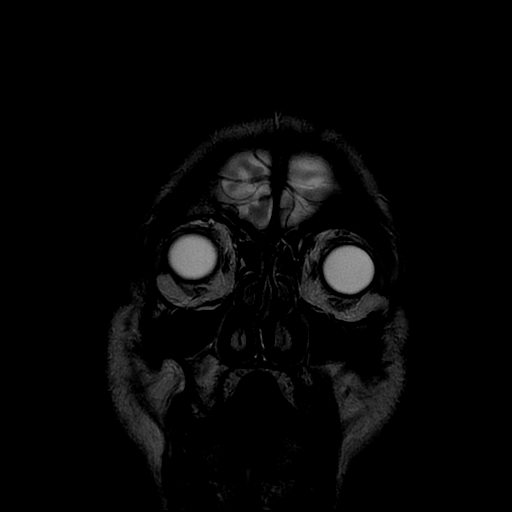

[Series 9: DWI · coronal · 5.0mm · 1.09mm/px · 7 of 64 slices shown (2 of 4)]
[im 1/64]
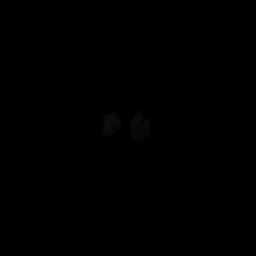
[im 11/64]
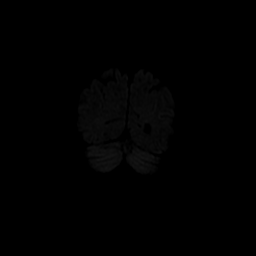
[im 22/64]
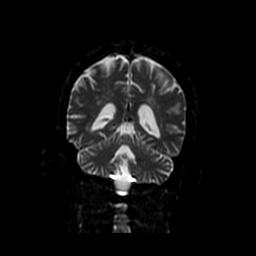
[im 32/64]
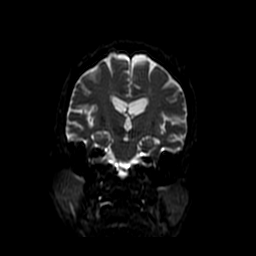
[im 43/64]
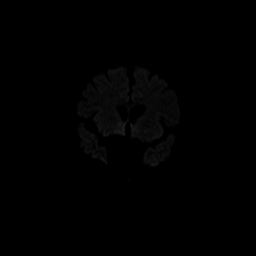
[im 53/64]
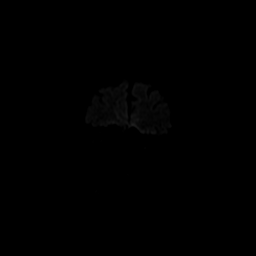
[im 64/64]
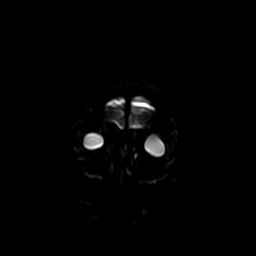

[Series 10: ax mpgr · axial · 5.0mm · 0.45mm/px · 1 of 23 slices shown]
[im 1/23]
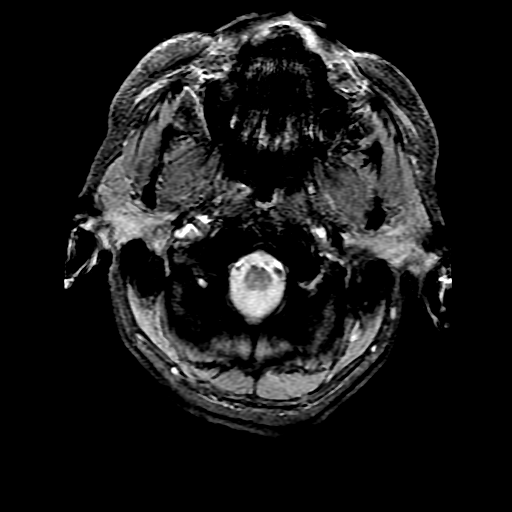

[Series 300: DWI · axial · 3.0mm · 1.09mm/px · z∈[+9,+140]mm · 5 of 45 slices shown (3 of 4)]
[im 1/45]
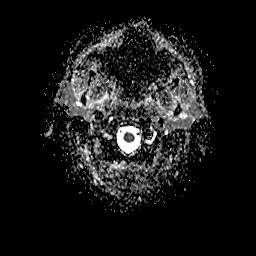
[im 12/45]
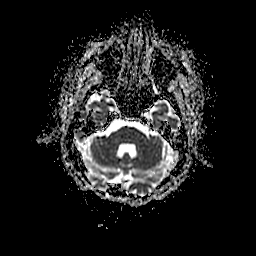
[im 23/45]
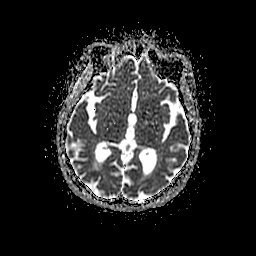
[im 34/45]
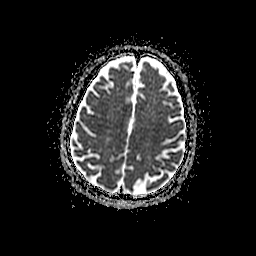
[im 45/45]
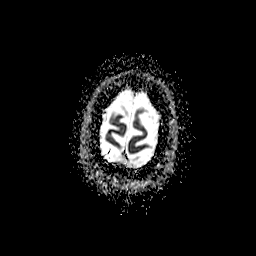

[Series 900: DWI · coronal · 5.0mm · 1.09mm/px · 3 of 32 slices shown (4 of 4)]
[im 1/32]
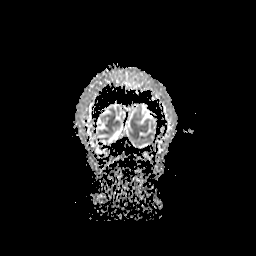
[im 16/32]
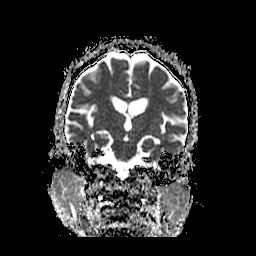
[im 32/32]
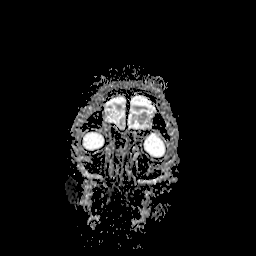

[35 of 48 positions shown; findings below may reference images not displayed]

FINDINGS: Ventricle size is normal. Cerebral volume normal for age. Pituitary
not enlarged.

Negative for acute infarct. Mild to moderate chronic microvascular
ischemic change in the white matter. Chronic infarct left internal
capsule. Brainstem and cerebellum intact.

Negative for intracranial hemorrhage.  No fluid collection.

Negative for mass or edema.  No shift of the midline structures.

Mild mucosal edema paranasal sinuses. No air-fluid level. Normal
orbit.
IMPRESSION: Mild to moderate chronic microvascular ischemia. No acute
intracranial abnormality.

## 2023-12-10 NOTE — Progress Notes (Signed)
 This encounter was created in error - please disregard.
